# Patient Record
Sex: Female | Born: 1955
Health system: Southern US, Community
[De-identification: ages and names within clinical notes are randomized; demographics above are authoritative.]

## PROBLEM LIST (undated history)

## (undated) DIAGNOSIS — A048 Other specified bacterial intestinal infections: Secondary | ICD-10-CM

## (undated) DIAGNOSIS — R0989 Other specified symptoms and signs involving the circulatory and respiratory systems: Secondary | ICD-10-CM

## (undated) DIAGNOSIS — E039 Hypothyroidism, unspecified: Secondary | ICD-10-CM

## (undated) DIAGNOSIS — J189 Pneumonia, unspecified organism: Secondary | ICD-10-CM

## (undated) DIAGNOSIS — I7090 Unspecified atherosclerosis: Secondary | ICD-10-CM

## (undated) DIAGNOSIS — I771 Stricture of artery: Secondary | ICD-10-CM

## (undated) DIAGNOSIS — J439 Emphysema, unspecified: Secondary | ICD-10-CM

## (undated) DIAGNOSIS — M940 Chondrocostal junction syndrome [Tietze]: Secondary | ICD-10-CM

## (undated) DIAGNOSIS — E785 Hyperlipidemia, unspecified: Secondary | ICD-10-CM

## (undated) DIAGNOSIS — K219 Gastro-esophageal reflux disease without esophagitis: Secondary | ICD-10-CM

## (undated) DIAGNOSIS — K635 Polyp of colon: Secondary | ICD-10-CM

## (undated) DIAGNOSIS — K649 Unspecified hemorrhoids: Secondary | ICD-10-CM

## (undated) DIAGNOSIS — R12 Heartburn: Secondary | ICD-10-CM

## (undated) DIAGNOSIS — R55 Syncope and collapse: Secondary | ICD-10-CM

## (undated) DIAGNOSIS — J42 Unspecified chronic bronchitis: Secondary | ICD-10-CM

## (undated) DIAGNOSIS — R42 Dizziness and giddiness: Secondary | ICD-10-CM

## (undated) HISTORY — DX: Other specified bacterial intestinal infections: A04.8

## (undated) HISTORY — DX: Stricture of artery: I77.1

## (undated) HISTORY — DX: Other specified symptoms and signs involving the circulatory and respiratory systems: R09.89

## (undated) HISTORY — DX: Polyp of colon: K63.5

## (undated) HISTORY — DX: Unspecified atherosclerosis: I70.90

## (undated) HISTORY — DX: Chondrocostal junction syndrome (tietze): M94.0

## (undated) HISTORY — DX: Emphysema, unspecified: J43.9

## (undated) HISTORY — DX: Unspecified hemorrhoids: K64.9

## (undated) SURGERY — Surgical Case
Anesthesia: *Unknown

---

## 1979-06-17 HISTORY — PX: TUBAL LIGATION: SHX77

## 1979-06-17 HISTORY — PX: DILATION AND CURETTAGE OF UTERUS: SHX78

## 1989-06-16 HISTORY — PX: BREAST BIOPSY: SHX20

## 1997-10-16 HISTORY — PX: ABDOMINAL HYSTERECTOMY: SHX81

## 2004-07-11 ENCOUNTER — Encounter: Admission: RE | Admit: 2004-07-11 | Discharge: 2004-07-11 | Payer: Self-pay | Admitting: Family Medicine

## 2004-07-11 ENCOUNTER — Encounter: Admission: RE | Admit: 2004-07-11 | Discharge: 2004-07-11 | Payer: Self-pay | Admitting: Orthopedic Surgery

## 2004-07-20 ENCOUNTER — Encounter: Admission: RE | Admit: 2004-07-20 | Discharge: 2004-07-20 | Payer: Self-pay | Admitting: Family Medicine

## 2004-08-22 ENCOUNTER — Ambulatory Visit: Payer: Self-pay | Admitting: Family Medicine

## 2005-09-15 ENCOUNTER — Encounter: Admission: RE | Admit: 2005-09-15 | Discharge: 2005-09-15 | Payer: Self-pay | Admitting: Emergency Medicine

## 2005-11-14 DIAGNOSIS — I7090 Unspecified atherosclerosis: Secondary | ICD-10-CM

## 2005-11-14 HISTORY — DX: Unspecified atherosclerosis: I70.90

## 2007-05-28 DIAGNOSIS — E039 Hypothyroidism, unspecified: Secondary | ICD-10-CM | POA: Insufficient documentation

## 2007-05-28 DIAGNOSIS — J449 Chronic obstructive pulmonary disease, unspecified: Secondary | ICD-10-CM | POA: Insufficient documentation

## 2007-05-28 DIAGNOSIS — J45909 Unspecified asthma, uncomplicated: Secondary | ICD-10-CM | POA: Insufficient documentation

## 2009-02-01 ENCOUNTER — Encounter: Admission: RE | Admit: 2009-02-01 | Discharge: 2009-02-01 | Payer: Self-pay | Admitting: Family Medicine

## 2009-02-03 ENCOUNTER — Encounter: Admission: RE | Admit: 2009-02-03 | Discharge: 2009-02-03 | Payer: Self-pay | Admitting: Family Medicine

## 2009-02-05 DIAGNOSIS — R0989 Other specified symptoms and signs involving the circulatory and respiratory systems: Secondary | ICD-10-CM

## 2009-02-05 HISTORY — DX: Other specified symptoms and signs involving the circulatory and respiratory systems: R09.89

## 2010-11-05 ENCOUNTER — Encounter: Payer: Self-pay | Admitting: Emergency Medicine

## 2011-09-29 ENCOUNTER — Ambulatory Visit: Payer: Self-pay | Admitting: Family Medicine

## 2012-03-31 ENCOUNTER — Other Ambulatory Visit: Payer: Self-pay | Admitting: Family Medicine

## 2012-06-24 ENCOUNTER — Other Ambulatory Visit: Payer: Self-pay | Admitting: Physician Assistant

## 2012-06-24 NOTE — Telephone Encounter (Signed)
Needs office visit.

## 2012-07-15 ENCOUNTER — Emergency Department (HOSPITAL_COMMUNITY)
Admission: EM | Admit: 2012-07-15 | Discharge: 2012-07-15 | Disposition: A | Discharge status: Home / Self Care | Payer: Self-pay | Attending: Emergency Medicine | Admitting: Emergency Medicine

## 2012-07-15 ENCOUNTER — Ambulatory Visit: Payer: Self-pay

## 2012-07-15 ENCOUNTER — Ambulatory Visit (INDEPENDENT_AMBULATORY_CARE_PROVIDER_SITE_OTHER): Payer: Self-pay | Admitting: Family Medicine

## 2012-07-15 ENCOUNTER — Encounter (HOSPITAL_COMMUNITY): Payer: Self-pay | Admitting: *Deleted

## 2012-07-15 ENCOUNTER — Emergency Department (HOSPITAL_COMMUNITY): Payer: Self-pay

## 2012-07-15 VITALS — BP 120/72 | HR 90 | Temp 98.0°F | Resp 16 | Ht 65.0 in | Wt 156.0 lb

## 2012-07-15 DIAGNOSIS — R42 Dizziness and giddiness: Secondary | ICD-10-CM | POA: Insufficient documentation

## 2012-07-15 DIAGNOSIS — R112 Nausea with vomiting, unspecified: Secondary | ICD-10-CM

## 2012-07-15 DIAGNOSIS — K219 Gastro-esophageal reflux disease without esophagitis: Secondary | ICD-10-CM | POA: Insufficient documentation

## 2012-07-15 DIAGNOSIS — R12 Heartburn: Secondary | ICD-10-CM | POA: Insufficient documentation

## 2012-07-15 DIAGNOSIS — R110 Nausea: Secondary | ICD-10-CM | POA: Insufficient documentation

## 2012-07-15 DIAGNOSIS — R197 Diarrhea, unspecified: Secondary | ICD-10-CM

## 2012-07-15 DIAGNOSIS — R1013 Epigastric pain: Secondary | ICD-10-CM | POA: Insufficient documentation

## 2012-07-15 DIAGNOSIS — I209 Angina pectoris, unspecified: Secondary | ICD-10-CM

## 2012-07-15 DIAGNOSIS — R55 Syncope and collapse: Secondary | ICD-10-CM | POA: Insufficient documentation

## 2012-07-15 DIAGNOSIS — F17200 Nicotine dependence, unspecified, uncomplicated: Secondary | ICD-10-CM | POA: Insufficient documentation

## 2012-07-15 DIAGNOSIS — R079 Chest pain, unspecified: Secondary | ICD-10-CM

## 2012-07-15 DIAGNOSIS — Z7982 Long term (current) use of aspirin: Secondary | ICD-10-CM | POA: Insufficient documentation

## 2012-07-15 DIAGNOSIS — R1110 Vomiting, unspecified: Secondary | ICD-10-CM

## 2012-07-15 DIAGNOSIS — Z79899 Other long term (current) drug therapy: Secondary | ICD-10-CM | POA: Insufficient documentation

## 2012-07-15 DIAGNOSIS — E039 Hypothyroidism, unspecified: Secondary | ICD-10-CM

## 2012-07-15 HISTORY — DX: Syncope and collapse: R55

## 2012-07-15 HISTORY — DX: Hypothyroidism, unspecified: E03.9

## 2012-07-15 HISTORY — DX: Heartburn: R12

## 2012-07-15 HISTORY — DX: Gastro-esophageal reflux disease without esophagitis: K21.9

## 2012-07-15 HISTORY — DX: Dizziness and giddiness: R42

## 2012-07-15 LAB — CBC
HCT: 39.6 % (ref 36.0–46.0)
Hemoglobin: 13.3 g/dL (ref 12.0–15.0)
MCH: 32.5 pg (ref 26.0–34.0)
MCHC: 33.6 g/dL (ref 30.0–36.0)
MCV: 96.8 fL (ref 78.0–100.0)
Platelets: 322 10*3/uL (ref 150–400)
RBC: 4.09 MIL/uL (ref 3.87–5.11)
RDW: 12.7 % (ref 11.5–15.5)
WBC: 7.2 10*3/uL (ref 4.0–10.5)

## 2012-07-15 LAB — POCT UA - MICROSCOPIC ONLY
Casts, Ur, LPF, POC: NEGATIVE
Crystals, Ur, HPF, POC: NEGATIVE
Mucus, UA: NEGATIVE
Yeast, UA: NEGATIVE

## 2012-07-15 LAB — BASIC METABOLIC PANEL
BUN: 8 mg/dL (ref 6–23)
CO2: 23 mEq/L (ref 19–32)
Calcium: 9.7 mg/dL (ref 8.4–10.5)
Chloride: 101 mEq/L (ref 96–112)
Creatinine, Ser: 0.59 mg/dL (ref 0.50–1.10)
GFR calc Af Amer: >90 mL/min (ref >90)
GFR calc non Af Amer: >90 mL/min (ref >90)
Glucose, Bld: 77 mg/dL (ref 70–99)
Potassium: 3.6 mEq/L (ref 3.5–5.1)
Sodium: 136 mEq/L (ref 135–145)

## 2012-07-15 LAB — POCT URINALYSIS DIPSTICK
Bilirubin, UA: NEGATIVE
Blood, UA: NEGATIVE
Glucose, UA: NEGATIVE
Ketones, UA: NEGATIVE
Leukocytes, UA: NEGATIVE
Nitrite, UA: NEGATIVE
Spec Grav, UA: 1.01
Urobilinogen, UA: 0.2
pH, UA: 6

## 2012-07-15 LAB — HEPATIC FUNCTION PANEL
Albumin: 3.6 g/dL (ref 3.5–5.2)
Alkaline Phosphatase: 76 U/L (ref 39–117)
Bilirubin, Direct: <0.1 mg/dL (ref 0.0–0.3)
Total Bilirubin: 0.1 mg/dL — ABNORMAL LOW (ref 0.3–1.2)

## 2012-07-15 LAB — POCT I-STAT TROPONIN I: Troponin i, poc: 0 ng/mL (ref 0.00–0.08)

## 2012-07-15 LAB — LIPASE, BLOOD: Lipase: 54 U/L (ref 11–59)

## 2012-07-15 MED ORDER — OMEPRAZOLE 20 MG PO CPDR: 20.0000 mg | DELAYED_RELEASE_CAPSULE | Freq: Two times a day (BID) | ORAL | Status: DC

## 2012-07-15 MED ORDER — HYDROCODONE-ACETAMINOPHEN 5-500 MG PO TABS: 1.0000 | ORAL_TABLET | Freq: Four times a day (QID) | ORAL | Status: DC | PRN

## 2012-07-15 NOTE — Progress Notes (Signed)
Subjective:    Patient ID: Bethany Clarke, female    DOB: 08-26-1956, 56 y.o.   MRN: [id number]  HPI Bethany Clarke is a 56 y.o. female Hx of hypothyroidism, hyperlipidemia.  C/o n/v, diarrhea , heartburn and chest pain.   Started with soft stools/diarrhea 4 weeks ago.  More watery stools for past 2 weeks - 2 to 3 times per day.  No blood in diarrhea. .  Heartburn and hurting in L chest behind/under breast - chest has felt tight for a week with heartburn, but worse since 3 days to 4 days ago. Nonstop pain.  Pain to push on area at times too, but tightness moves around to back.  No neck, arm or jaw radiation.  No trouble breathing.   Last thyroid check 2 years ago.   Felt "bottomed out" lightheaded spells - few times during the day for past week, near syncope once this am. No diaphoresis, no dyspnea. Drinking fluids, trying to eat, but comes back up.  Lost about 8 pounds over past 4 weeks. Smoker.   No urinary symptoms.  Hx of carotid bruit. Doppler in 4/10 - 0-49%reduction bilaterally.  No known sick contacts.  No treatments attempted.   No known hx CAD, but FH CAD: sister with quadruple bypass at 44yo.  No personal hx of stress test.    Last lipids 03/07/11: LDL 130, TSH 2.79 at that time.  No recent levels checked.     Review of Systems  Constitutional: Negative for fever.  Respiratory: Positive for chest tightness. Negative for shortness of breath.   Cardiovascular: Positive for chest pain.  Gastrointestinal: Positive for nausea, vomiting and diarrhea. Negative for abdominal pain, blood in stool and anal bleeding.  Genitourinary: Negative for dysuria, urgency, frequency, hematuria and difficulty urinating.       Objective:   Physical Exam  Constitutional: She is oriented to person, place, and time. She appears well-developed and well-nourished.  HENT:  Head: Normocephalic and atraumatic.  Eyes: Conjunctivae normal and EOM are normal. Pupils are equal, round, and reactive to  light.  Neck: Carotid bruit is present. No mass and no thyromegaly present.    Cardiovascular: Normal rate, regular rhythm, normal heart sounds and intact distal pulses.  Exam reveals no friction rub.   No murmur heard.      Some ttp lower chest wall - epigastric area, but no focal abdominal ttp.   Pulmonary/Chest: Effort normal and breath sounds normal.  Abdominal: Soft. Bowel sounds are normal. She exhibits no pulsatile midline mass. There is no tenderness. There is no rebound and no guarding.  Neurological: She is alert and oriented to person, place, and time.  Skin: Skin is warm and dry.       Normal turgor.   Psychiatric: She has a normal mood and affect. Her behavior is normal.     EKG: SR, st depression v2-4, nonspecific t waves inferolateral leads - not seen on 02/2007 EKG.   Current pain 1/10. Results for orders placed in visit on 07/15/12  POCT UA - MICROSCOPIC ONLY      Component Value Range   WBC, Ur, HPF, POC 1-2     RBC, urine, microscopic 0-1     Bacteria, U Microscopic small     Mucus, UA neg     Epithelial cells, urine per micros 2-3     Crystals, Ur, HPF, POC neg     Casts, Ur, LPF, POC neg     Yeast, UA neg    POCT  URINALYSIS DIPSTICK      Component Value Range   Color, UA yellow     Clarity, UA clear     Glucose, UA neg     Bilirubin, UA neg     Ketones, UA neg     Spec Grav, UA 1.010     Blood, UA neg     pH, UA 6.0     Protein, UA tr     Urobilinogen, UA 0.2     Nitrite, UA neg     Leukocytes, UA Negative         Assessment & Plan:  Bethany Clarke is a 56 y.o. female 1. Chest pain  EKG 12-Lead, DG Chest 2 View  2. GERD (gastroesophageal reflux disease)    3. Nausea    4. Vomiting  POCT UA - Microscopic Only, POCT urinalysis dipstick  5. Diarrhea  POCT UA - Microscopic Only, POCT urinalysis dipstick  6. Hypothyroidism    7. Dizziness      Complicated recent hx of diarrhea, n/v, with progression to substernal, L sided chest pain with  radiation to back for past 1 week with worsening over past 3-4 days. Accompanied by near syncopal episodes. Risk factors for CAD include tobacco abuse, hyperlipidemia and early FH CAD with quadruple bypass in sister at 44yo.  ekg changes noted since 2008. Possible GERD/esophageal spasm, vs atypical cp with anginal equivalent.  IV placed, monitor, and asa 243 mg chewable(took 81mg  on own this am) given at 1153.  EMS called for transport. Follow up after hospital for GI symptoms for possible stool cx and treatment/eval of GI symptoms.  Charge nurse advised at [facility] ER.

## 2012-07-15 NOTE — ED Provider Notes (Signed)
History     CSN: [id number]  Arrival date & time 07/15/12  1238   First MD Initiated Contact with Patient 07/15/12 1248      Chief Complaint  Patient presents with  . Chest Pain    (Consider location/radiation/quality/duration/timing/severity/associated sxs/prior treatment) HPI Comments: Patient was sent here by her pcp where she presented today for evaluation of lower chest burning and discomfort she says has been going on for the past four weeks.  There was concern over ekg changes at the office.  She denies to me there is any shortness of breath, nausea, or diaphoresis.  She denies any exertional symptoms but does seem to believe that her symptoms are worse when she eats.    Patient is a 56 y.o. female presenting with chest pain.  Chest Pain Episode onset: 4 weeks ago. Chest pain occurs intermittently. The chest pain is worsening. The pain is associated with eating. The severity of the pain is moderate. The quality of the pain is described as burning. The pain does not radiate. Chest pain is worsened by eating. Primary symptoms include nausea. Pertinent negatives for primary symptoms include no fever, no fatigue and no vomiting. She tried nothing for the symptoms. Risk factors include smoking/tobacco exposure (High cholesterol, Family History).     Past Medical History  Diagnosis Date  . GERD (gastroesophageal reflux disease)   . Hypothyroid   . Dizziness   . Syncope, near   . Heart burn     No past surgical history on file.  No family history on file.  History  Substance Use Topics  . Smoking status: Current Every Day Smoker  . Smokeless tobacco: Not on file  . Alcohol Use: Not on file    OB History    Grav Para Term Preterm Abortions TAB SAB Ect Mult Living                  Review of Systems  Constitutional: Negative for fever and fatigue.  Cardiovascular: Positive for chest pain.  Gastrointestinal: Positive for nausea. Negative for vomiting.  All other  systems reviewed and are negative.    Allergies  Review of patient's allergies indicates no known allergies.  Home Medications   Current Outpatient Rx  Name Route Sig Dispense Refill  . ASPIRIN 81 MG PO TABS Oral Take 81 mg by mouth daily.    . ATORVASTATIN CALCIUM 20 MG PO TABS  TAKE 1 TABLET BY MOUTH DAILY 30 tablet 0  . SYNTHROID 100 MCG PO TABS  TAKE 1 TABLET BY MOUTH DAILY - NEED OFFICE VISIT 30 tablet 0    BP 99/59  Pulse 77  Temp 98.8 F (37.1 C) (Oral)  Resp 15  SpO2 92%  Physical Exam  Nursing note and vitals reviewed. Constitutional: She is oriented to person, place, and time. She appears well-developed and well-nourished. No distress.  HENT:  Head: Normocephalic and atraumatic.  Neck: Normal range of motion. Neck supple.  Cardiovascular: Normal rate and regular rhythm.  Exam reveals no gallop and no friction rub.   No murmur heard. Pulmonary/Chest: Effort normal and breath sounds normal. No respiratory distress. She has no wheezes.  Abdominal: Soft. Bowel sounds are normal. She exhibits no distension. There is no tenderness.  Musculoskeletal: Normal range of motion.  Neurological: She is alert and oriented to person, place, and time.  Skin: Skin is warm and dry. She is not diaphoretic.    ED Course  Procedures (including critical care time)   Labs Reviewed  CBC  BASIC METABOLIC PANEL  POCT I-STAT TROPONIN I   No results found.   No diagnosis found.   Date: 07/15/2012  Rate: 81  Rhythm: normal sinus rhythm  QRS Axis: normal  Intervals: normal  ST/T Wave abnormalities: nonspecific T wave changes  Conduction Disutrbances:none  Narrative Interpretation:   Old EKG Reviewed: unchanged    MDM  The patient was sent here for evaluation of upper abdominal/chest discomfort that she has been having for the past month.  It is related to eating and has been having nausea and episodes of diarrhea.  There is no exertional component and I doubt a cardiac  etiology.  Two separate troponins have been negative.  I am in the process of imaging the abdomen with an ultrasound to rule out the possibility of cholelithiasis.  At this point, the care will be signed out to Dr. Ignacia Palma who will obtain the results of the ultrasound and determine the final disposition.          Geoffery Lyons, MD 07/15/12 [id number]

## 2012-07-15 NOTE — Progress Notes (Signed)
56 yo woman with epigastric pain, vomiting x 1 week, Exam showing epigastric localization of pain but no point tenderness, Lab tests WNL, abdominal ultrasound showing gall bladder sludge and common bile duct dilated to 15 mm.  Call to GI --> case discussed with Dr. Yancey Flemings, who advised placing pt on a PPI and having her call in to the [place] [facility] office tomorrow to be seen this week for further workup.

## 2012-07-15 NOTE — ED Notes (Signed)
PT reports a several  Week HX of CP . Strong  Family Hx of cardiac dx.

## 2012-07-16 ENCOUNTER — Telehealth: Payer: Self-pay | Admitting: Internal Medicine

## 2012-07-16 ENCOUNTER — Encounter: Payer: Self-pay | Admitting: Internal Medicine

## 2012-07-16 ENCOUNTER — Ambulatory Visit (INDEPENDENT_AMBULATORY_CARE_PROVIDER_SITE_OTHER): Payer: Self-pay | Admitting: Internal Medicine

## 2012-07-16 VITALS — BP 118/64 | HR 72 | Ht 65.0 in | Wt 156.0 lb

## 2012-07-16 DIAGNOSIS — R110 Nausea: Secondary | ICD-10-CM

## 2012-07-16 DIAGNOSIS — R197 Diarrhea, unspecified: Secondary | ICD-10-CM

## 2012-07-16 DIAGNOSIS — R1013 Epigastric pain: Secondary | ICD-10-CM

## 2012-07-16 DIAGNOSIS — R932 Abnormal findings on diagnostic imaging of liver and biliary tract: Secondary | ICD-10-CM

## 2012-07-16 MED ORDER — ESOMEPRAZOLE MAGNESIUM 40 MG PO CPDR: 40.0000 mg | DELAYED_RELEASE_CAPSULE | Freq: Every day | ORAL | Status: DC

## 2012-07-16 NOTE — Progress Notes (Signed)
HISTORY OF PRESENT ILLNESS:  Bethany Clarke is a 56 y.o. female with hypothyroidism and COPD who presents today, after being evaluated in the emergency room yesterday, regarding abdominal pain and abnormal abdominal imaging. The patient's only prior GI history is that of screening colonoscopy 02/23/2009 with Dr. Jeani Hawking. The examination was complete with good preparation. A diminutive hyperplastic rectosigmoid colon polyp was removed and small internal hemorrhoids noted. Routine followup in 10 years recommended. Patient states that she was in her usual state of good health until approximately 4 weeks ago when she developed, what she describes as a "upset stomach". She began to notice postprandial loose bowel movements with some vague mid abdominal discomfort. This continued until last week when she began to develop postprandial nausea with vomiting as well as severe epigastric and left upper quadrant pain. This bothered her for about 3 days. She was seen at urgent care and thought this may represent an anginal equivalent. Thus, she was sent to the emergency room. She denies fevers, melena, hematochezia, or significant weight loss. She has complained of decreased energy. Emergency room workup included comprehensive metabolic panel and CBC. These were normal. Cardiac workup, including troponins were negative. She was not felt to have a cardiac problem. They did obtain an abdominal ultrasound yesterday which revealed gallbladder sludge without stones. No gallbladder wall thickening. She was said to have a dilated common bile duct of 15 mm. However, the duct was not well visualized in the region of the pancreas. There was no intrahepatic ductal dilation. She was prescribed Vicodin and omeprazole, which she has not filled. No new complaints today. She smokes but does not use alcohol. Her chronic medications are unchanged.  REVIEW OF SYSTEMS:  All non-GI ROS negative except for fatigue and visual  change  Past Medical History  Diagnosis Date  . GERD (gastroesophageal reflux disease)   . Hypothyroid   . Dizziness   . Syncope, near   . Heart burn   . COPD (chronic obstructive pulmonary disease)     Past Surgical History  Procedure Date  . Abdominal hysterectomy     Social History Bethany Clarke  reports that she has been smoking.  She has never used smokeless tobacco. She reports that she does not drink alcohol or use illicit drugs.  family history is negative for Colon cancer.  No Known Allergies     PHYSICAL EXAMINATION: Vital signs: BP 118/64  Pulse 72  Ht 5\' 5"  (1.651 m)  Wt 156 lb (70.761 kg)  BMI 25.96 kg/m2  Constitutional: generally well-appearing, no acute distress Psychiatric: alert and oriented x3, cooperative Eyes: extraocular movements intact, anicteric, conjunctiva pink Mouth: oral pharynx moist, no lesions Neck: supple no lymphadenopathy Cardiovascular: heart regular rate and rhythm, no murmur Lungs: clear to auscultation bilaterally Abdomen: soft, mild epigastric tenderness, nondistended, no obvious ascites, no peritoneal signs, normal bowel sounds, no organomegaly Rectal:omitted Extremities: no lower extremity edema bilaterally Skin: no lesions on visible extremities Neuro: No focal deficits.   ASSESSMENT:  #1. 4 week history of postprandial loose stools. May be postinfectious IBS #2. Less than 1 week history of postprandial, pain with associated nausea/vomiting. Rule out peptic ulcer #3. Question of isolated dilation of the bile duct. Clinical significance uncertain  #4. Screening colonoscopy May 2010 without neoplasia   PLAN:  #1. Begin PPI. Nexium 40 mg daily x10 days provided #2. Schedule diagnostic upper endoscopy.The nature of the procedure, as well as the risks, benefits, and alternatives were carefully and thoroughly reviewed with the patient.  Ample time for discussion and questions allowed. The patient understood, was satisfied,  and agreed to proceed.  #3. If upper endoscopy negative, and pain continues, consider HIDA scan #4. Consider MRCP to further evaluate the bile duct pending clinical course #5. Repeat screening colonoscopy around 2020

## 2012-07-16 NOTE — Telephone Encounter (Signed)
Pt scheduled to see Dr. Marina Goodell today at 1:45pm. ER physician spoke with Dr. Marina Goodell last night regarding the pt. Pt aware of appt date and time.

## 2012-07-16 NOTE — Patient Instructions (Addendum)
You have been scheduled for an endoscopy with propofol. Please follow written instructions given to you at your visit today. If you use inhalers (even only as needed), please bring them with you on the day of your procedure.   You have been given some samples of Nexium to try.  Take one capsule 30 minutes before breakfast .

## 2012-07-25 ENCOUNTER — Ambulatory Visit (AMBULATORY_SURGERY_CENTER): Payer: BC Managed Care – PPO | Admitting: Internal Medicine

## 2012-07-25 ENCOUNTER — Encounter: Payer: Self-pay | Admitting: Internal Medicine

## 2012-07-25 VITALS — BP 102/80 | HR 77 | Temp 98.9°F | Resp 21 | Ht 65.0 in | Wt 156.0 lb

## 2012-07-25 DIAGNOSIS — R110 Nausea: Secondary | ICD-10-CM

## 2012-07-25 DIAGNOSIS — R1013 Epigastric pain: Secondary | ICD-10-CM

## 2012-07-25 DIAGNOSIS — K2900 Acute gastritis without bleeding: Secondary | ICD-10-CM

## 2012-07-25 DIAGNOSIS — R197 Diarrhea, unspecified: Secondary | ICD-10-CM

## 2012-07-25 DIAGNOSIS — K219 Gastro-esophageal reflux disease without esophagitis: Secondary | ICD-10-CM

## 2012-07-25 MED ORDER — SODIUM CHLORIDE 0.9 % IV SOLN: 500.0000 mL | INTRAVENOUS | Status: DC

## 2012-07-25 MED ORDER — OMEPRAZOLE 20 MG PO CPDR: 20.0000 mg | DELAYED_RELEASE_CAPSULE | Freq: Every day | ORAL | Status: DC

## 2012-07-25 NOTE — Progress Notes (Signed)
Dentures remain in place, as pre procedure, no defects.

## 2012-07-25 NOTE — Patient Instructions (Addendum)
YOU HAD AN ENDOSCOPIC PROCEDURE TODAY AT THE [facility]: Refer to the procedure report that was given to you for any specific questions about what was found during the examination.  If the procedure report does not answer your questions, please call your gastroenterologist to clarify.  If you requested that your care partner not be given the details of your procedure findings, then the procedure report has been included in a sealed envelope for you to review at your convenience later.  YOU SHOULD EXPECT: Some feelings of bloating in the abdomen. Passage of more gas than usual.  Walking can help get rid of the air that was put into your GI tract during the procedure and reduce the bloating. If you had a lower endoscopy (such as a colonoscopy or flexible sigmoidoscopy) you may notice spotting of blood in your stool or on the toilet paper. If you underwent a bowel prep for your procedure, then you may not have a normal bowel movement for a few days.  DIET: Your first meal following the procedure should be a light meal and then it is ok to progress to your normal diet.  A half-sandwich or bowl of soup is an example of a good first meal.  Heavy or fried foods are harder to digest and may make you feel nauseous or bloated.  Likewise meals heavy in dairy and vegetables can cause extra gas to form and this can also increase the bloating.  Drink plenty of fluids but you should avoid alcoholic beverages for 24 hours.  ACTIVITY: Your care partner should take you home directly after the procedure.  You should plan to take it easy, moving slowly for the rest of the day.  You can resume normal activity the day after the procedure however you should NOT DRIVE or use heavy machinery for 24 hours (because of the sedation medicines used during the test).    SYMPTOMS TO REPORT IMMEDIATELY: A gastroenterologist can be reached at any hour.  During normal business hours, 8:30 AM to 5:00 PM Monday through Friday,  call [phone].  After hours and on weekends, please call the GI answering service at [phone] who will take a message and have the physician on call contact you.   Following lower endoscopy (colonoscopy or flexible sigmoidoscopy):  Excessive amounts of blood in the stool  Significant tenderness or worsening of abdominal pains  Swelling of the abdomen that is new, acute  Fever of 100F or higher  Following upper endoscopy (EGD)  Vomiting of blood or coffee ground material  New chest pain or pain under the shoulder blades  Painful or persistently difficult swallowing  New shortness of breath  Fever of 100F or higher  Black, tarry-looking stools  FOLLOW UP: If any biopsies were taken you will be contacted by phone or by letter within the next 1-3 weeks.  Call your gastroenterologist if you have not heard about the biopsies in 3 weeks.  Our staff will call the home number listed on your records the next business day following your procedure to check on you and address any questions or concerns that you may have at that time regarding the information given to you following your procedure. This is a courtesy call and so if there is no answer at the home number and we have not heard from you through the emergency physician on call, we will assume that you have returned to your regular daily activities without incident.  SIGNATURES/CONFIDENTIALITY: You and/or your care   partner have signed paperwork which will be entered into your electronic medical record.  These signatures attest to the fact that that the information above on your After Visit Summary has been reviewed and is understood.  Full responsibility of the confidentiality of this discharge information lies with you and/or your care-partner.   Please follow all discharge instructions given to you by the recovery room nurse. If you have any questions or problems after discharge please call one of the numbers listed above. You will  receive a phone call in the am to see how you are doing and answer any questions you may have. Thank you for choosing [facility] for your health care needs.  

## 2012-07-25 NOTE — Op Note (Signed)
[facility] [address] [place], [address]   ENDOSCOPY PROCEDURE REPORT  PATIENT: Bethany Clarke, Bethany Clarke  MR#: [id number] BIRTHDATE: 1956-01-17 , 56  yrs. old GENDER: Female ENDOSCOPIST: Roxy Cedar, MD REFERRED BY:  Office Self, ER PROCEDURE DATE:  07/25/2012 PROCEDURE:  EGD w/ biopsy ASA CLASS:     Class II INDICATIONS:  epigastric pain.   nausea.   vomiting. MEDICATIONS: MAC sedation, administered by CRNA and propofol (Diprivan) 200mg  IV TOPICAL ANESTHETIC: Cetacaine Spray  DESCRIPTION OF PROCEDURE: After the risks benefits and alternatives of the procedure were thoroughly explained, informed consent was obtained.  The LB GIF-H180 [id number] endoscope was introduced through the mouth and advanced to the second portion of the duodenum. Without limitations.  The instrument was slowly withdrawn as the mucosa was fully examined.    EXAM: Normal esophagus.  Mild antral gastritis and non-erosive duodenitis.  Otherwise normal exam.  Clo bx taken.  Retroflexed views revealed no abnormalities.     The scope was then withdrawn from the patient and the procedure completed.  COMPLICATIONS: There were no complications. ENDOSCOPIC IMPRESSION: 1.  Normal esophagus. 2.  Mild antral gastritis and non-erosive duodenitis. 3.  Otherwise normal exam.  Clo bx taken.  RECOMMENDATIONS: 1.  Continue PPI - Helping reflux symptoms . Prescribe OMEPRAZOLE 20 MG DAILY; #30; 6 REFILLS 2.  Rx CLO if positive 3.  My office will arrange for you to have a HIDA scan performed. This is a radiology test which gives [place] an idea of how well your gallbladder functions. FURTHER PLANS TO BE DETERMINED THEREAFTER    eSigned:  Roxy Cedar, MD 07/25/2012 12:46 PM   CC:The Patient and Meredith Staggers, MD

## 2012-07-25 NOTE — Progress Notes (Signed)
Patient did not experience any of the following events: a burn prior to discharge; a fall within the facility; wrong site/side/patient/procedure/implant event; or a hospital transfer or hospital admission upon discharge from the facility. (G8907) Patient did not have preoperative order for IV antibiotic SSI prophylaxis. (G8918)  

## 2012-07-25 NOTE — Progress Notes (Signed)
Patient has full upper and lower dentures and refuses to remove them. Dr. Marina Goodell  And Marrion Coy CRNA informed,both in agreement for patient to leave them. Patient stating the dentures are secure.

## 2012-07-26 ENCOUNTER — Telehealth: Payer: Self-pay | Admitting: *Deleted

## 2012-07-26 ENCOUNTER — Other Ambulatory Visit: Payer: Self-pay | Admitting: Internal Medicine

## 2012-07-26 ENCOUNTER — Telehealth: Payer: Self-pay

## 2012-07-26 DIAGNOSIS — R109 Unspecified abdominal pain: Secondary | ICD-10-CM

## 2012-07-26 NOTE — Telephone Encounter (Signed)
Pt scheduled for HIDA scan at [facility] 08/08/12 arrival time 12:15 for a 12:30pm appt. Pt to be NPO after 6am. Pt aware of appt date and time.

## 2012-07-26 NOTE — Telephone Encounter (Signed)
  Follow up Call-  Call back number 07/25/2012  Post procedure Call Back phone  # [phone]  Permission to leave phone message Yes     Patient questions:  Do you have a fever, pain , or abdominal swelling? no Pain Score  0 *  Have you tolerated food without any problems? yes  Have you been able to return to your normal activities? yes  Do you have any questions about your discharge instructions: Diet   no Medications  no Follow up visit  no  Do you have questions or concerns about your Care? no  Actions: * If pain score is 4 or above: No action needed, pain <4.

## 2012-07-29 ENCOUNTER — Other Ambulatory Visit: Payer: Self-pay | Admitting: Internal Medicine

## 2012-07-29 MED ORDER — AMOXICILL-CLARITHRO-LANSOPRAZ PO MISC: Freq: Two times a day (BID) | ORAL | Status: DC

## 2012-08-05 ENCOUNTER — Other Ambulatory Visit: Payer: Self-pay | Admitting: Physician Assistant

## 2012-08-05 NOTE — Telephone Encounter (Signed)
Per last refill needs OV - 2nd notice

## 2012-08-08 ENCOUNTER — Encounter (HOSPITAL_COMMUNITY): Payer: Self-pay

## 2012-08-08 ENCOUNTER — Encounter (HOSPITAL_COMMUNITY)
Admission: RE | Admit: 2012-08-08 | Discharge: 2012-08-08 | Disposition: A | Discharge status: Home / Self Care | Payer: BC Managed Care – PPO | Source: Ambulatory Visit | Attending: Internal Medicine | Admitting: Internal Medicine

## 2012-08-08 DIAGNOSIS — K838 Other specified diseases of biliary tract: Secondary | ICD-10-CM | POA: Insufficient documentation

## 2012-08-08 DIAGNOSIS — R109 Unspecified abdominal pain: Secondary | ICD-10-CM

## 2012-08-08 MED ORDER — TECHNETIUM TC 99M MEBROFENIN IV KIT
5.4000 | PACK | Freq: Once | INTRAVENOUS | Status: AC | PRN
  Administered 2012-08-08: 5.4 via INTRAVENOUS

## 2012-09-09 ENCOUNTER — Encounter: Payer: Self-pay | Admitting: Internal Medicine

## 2012-09-09 ENCOUNTER — Other Ambulatory Visit: Payer: Self-pay | Admitting: Physician Assistant

## 2012-09-09 ENCOUNTER — Ambulatory Visit (INDEPENDENT_AMBULATORY_CARE_PROVIDER_SITE_OTHER): Payer: BC Managed Care – PPO | Admitting: Internal Medicine

## 2012-09-09 VITALS — BP 124/80 | HR 60 | Ht 65.0 in | Wt 154.0 lb

## 2012-09-09 DIAGNOSIS — A048 Other specified bacterial intestinal infections: Secondary | ICD-10-CM

## 2012-09-09 DIAGNOSIS — R1011 Right upper quadrant pain: Secondary | ICD-10-CM

## 2012-09-09 DIAGNOSIS — R932 Abnormal findings on diagnostic imaging of liver and biliary tract: Secondary | ICD-10-CM

## 2012-09-09 DIAGNOSIS — B9681 Helicobacter pylori [H. pylori] as the cause of diseases classified elsewhere: Secondary | ICD-10-CM

## 2012-09-09 NOTE — Patient Instructions (Addendum)
You have been scheduled for an MRI at [facility] on 09-14-12. Your appointment time is 9:00am. Please arrive 15 minutes prior to your appointment time for registration purposes. Please do not eat or drink anything after 5:00am. However, if you have any metal in your body, have a pacemaker or defibrillator, please be sure to let your ordering physician know. This test typically takes 45 minutes to 1 hour to complete.

## 2012-09-09 NOTE — Progress Notes (Signed)
HISTORY OF PRESENT ILLNESS:  Bethany Clarke is a 56 y.o. female who was initially evaluated 07/16/2012 regarding abdominal pain, nausea/vomiting, and postprandial loose stools. In addition, isolated extrahepatic bile duct dilation on ultrasound. See that dictation. She subsequently underwent upper endoscopy which revealed mild antral gastritis and nonerosive duodenitis. Prescribe omeprazole 20 mg daily. She completed 2 weeks of Nexium samples, but did not pick up her omeprazole stating that Nexium did not seem to make a difference. However, she previously reported that PPI seemed to help reflux symptoms. Now denies reflux symptoms. Testing for Helicobacter pylori was positive. I did want to treat her with Prevpac x2 weeks. Not clear if she completed this therapy. She also underwent HIDA scan which was normal, the lower limits. Overall, she has improved. No further problems with nausea or vomiting. Still occasional loose stools. She does report epigastric right upper quadrant discomfort with certain foods such as heavy were greasy items. Discomfort to last for several hours.  REVIEW OF SYSTEMS:  All non-GI ROS negative   Past Medical History  Diagnosis Date  . GERD (gastroesophageal reflux disease)   . Hypothyroid   . Dizziness   . Syncope, near   . Heart burn   . COPD (chronic obstructive pulmonary disease)   . Emphysema of lung   . H. pylori infection     Past Surgical History  Procedure Date  . Abdominal hysterectomy     Social History Bethany Clarke  reports that she has been smoking.  She has never used smokeless tobacco. She reports that she does not drink alcohol or use illicit drugs.  family history is negative for Colon cancer.  No Known Allergies     PHYSICAL EXAMINATION: Vital signs: BP 124/80  Pulse 60  Ht 5\' 5"  (1.651 m)  Wt 154 lb (69.854 kg)  BMI 25.63 kg/m2 General: Well-developed, well-nourished, no acute distress HEENT: Sclerae are anicteric, conjunctiva  pink. Oral mucosa intact Lungs: Clear Heart: Regular Abdomen: soft, nontender, nondistended, no obvious ascites, no peritoneal signs, normal bowel sounds. No organomegaly. Extremities: No edema Psychiatric: alert and oriented x3. Cooperative    ASSESSMENT:  #1. Recurrent upper abdominal pain. Ongoing. Other significant GI symptoms resolved or significantly improved #2. Dilated extrahepatic duct on ultrasound. Normal liver tests. Normal ampulla on recent endoscopy #3. Mild gastroduodenitis. H. Pylori positive. Unclear if she completed therapy as prescribed #4. Negative screening colonoscopy in 2010 with Dr. Jeani Hawking   PLAN:  #1. MRCP /MRI to evaluate the dilated bile duct, given ongoing intermittent pain #2. Confirm whether she completed H. Pyloric therapy or not #3. Routine surveillance colonoscopy 2020

## 2012-09-14 ENCOUNTER — Ambulatory Visit (HOSPITAL_COMMUNITY)
Admission: RE | Admit: 2012-09-14 | Discharge: 2012-09-14 | Disposition: A | Discharge status: Home / Self Care | Payer: BC Managed Care – PPO | Source: Ambulatory Visit | Attending: Internal Medicine | Admitting: Internal Medicine

## 2012-09-14 ENCOUNTER — Inpatient Hospital Stay (HOSPITAL_COMMUNITY): Admission: RE | Admit: 2012-09-14 | Payer: BC Managed Care – PPO | Source: Ambulatory Visit

## 2012-09-14 DIAGNOSIS — R932 Abnormal findings on diagnostic imaging of liver and biliary tract: Secondary | ICD-10-CM

## 2012-09-14 DIAGNOSIS — R1011 Right upper quadrant pain: Secondary | ICD-10-CM | POA: Insufficient documentation

## 2012-09-14 MED ORDER — GADOBENATE DIMEGLUMINE 529 MG/ML IV SOLN
15.0000 mL | Freq: Once | INTRAVENOUS | Status: AC | PRN
  Administered 2012-09-14: 15 mL via INTRAVENOUS

## 2012-10-11 ENCOUNTER — Other Ambulatory Visit: Payer: Self-pay | Admitting: Physician Assistant

## 2012-11-14 ENCOUNTER — Other Ambulatory Visit: Payer: Self-pay | Admitting: Physician Assistant

## 2012-11-18 ENCOUNTER — Other Ambulatory Visit: Payer: Self-pay | Admitting: Internal Medicine

## 2012-11-18 DIAGNOSIS — R7989 Other specified abnormal findings of blood chemistry: Secondary | ICD-10-CM

## 2012-11-18 DIAGNOSIS — R945 Abnormal results of liver function studies: Secondary | ICD-10-CM

## 2012-11-30 ENCOUNTER — Other Ambulatory Visit: Payer: Self-pay

## 2012-12-05 ENCOUNTER — Telehealth: Payer: Self-pay

## 2012-12-05 MED ORDER — LEVOTHYROXINE SODIUM 100 MCG PO TABS: 100.0000 ug | ORAL_TABLET | Freq: Every day | ORAL | Status: DC

## 2012-12-05 MED ORDER — ATORVASTATIN CALCIUM 20 MG PO TABS: 20.0000 mg | ORAL_TABLET | Freq: Every day | ORAL | Status: DC

## 2012-12-05 NOTE — Telephone Encounter (Signed)
Sent in for her. Called her to advise.  

## 2012-12-05 NOTE — Telephone Encounter (Signed)
Pt called today and scheduled a CPE with Dr. Neva Seat for 3/31 (first avail CPE). She is requesting her synthroid and lipitor be refilled until then. Pt [phone]  [facility] [place]

## 2012-12-25 ENCOUNTER — Other Ambulatory Visit (INDEPENDENT_AMBULATORY_CARE_PROVIDER_SITE_OTHER): Payer: BC Managed Care – PPO

## 2012-12-25 ENCOUNTER — Ambulatory Visit (INDEPENDENT_AMBULATORY_CARE_PROVIDER_SITE_OTHER): Payer: BC Managed Care – PPO | Admitting: Internal Medicine

## 2012-12-25 ENCOUNTER — Encounter: Payer: Self-pay | Admitting: Internal Medicine

## 2012-12-25 VITALS — BP 108/78 | HR 73 | Ht 65.0 in | Wt 157.0 lb

## 2012-12-25 DIAGNOSIS — R7989 Other specified abnormal findings of blood chemistry: Secondary | ICD-10-CM

## 2012-12-25 DIAGNOSIS — R945 Abnormal results of liver function studies: Secondary | ICD-10-CM

## 2012-12-25 DIAGNOSIS — R1011 Right upper quadrant pain: Secondary | ICD-10-CM

## 2012-12-25 DIAGNOSIS — R932 Abnormal findings on diagnostic imaging of liver and biliary tract: Secondary | ICD-10-CM

## 2012-12-25 LAB — HEPATIC FUNCTION PANEL
Bilirubin, Direct: 0.1 mg/dL (ref 0.0–0.3)
Total Bilirubin: 0.3 mg/dL (ref 0.3–1.2)

## 2012-12-25 NOTE — Patient Instructions (Addendum)
Your physician has requested that you go to the basement for the following lab work before leaving today:  LFT's  Please follow up with Dr. Marina Goodell in 6 months

## 2012-12-25 NOTE — Progress Notes (Signed)
HISTORY OF PRESENT ILLNESS:  Bethany Clarke is a 57 y.o. female who was initially evaluated 07/16/2012 regarding abdominal pain, nausea/vomiting, and postprandial loose stools. In addition, an isolated extrahepatic bile duct dilation on ultrasound. Upper endoscopy revealed mild gastroduodenitis. H. pyloric positive. Subsequently treated. Last seen in 09/09/2012. The majority of her GI symptoms resolved except for upper abdominal pain. Subsequent MRI/MRCP revealed mild dilation of the extrahepatic bile duct with no other significant abnormalities. Liver tests were normal. Prior screening colonoscopy in 2010 with Dr. Elnoria Howard was normal. She presents now for routine followup as requested. Patient reports she is doing well. No nausea or vomiting. No loose stools. She does continue to have right upper quadrant pain approximately once per week. This is described as sharp and lasts about 30 minutes. No relationship to meals or time of day. No nocturnal component. Her appetite is good and weight stable. Actually gained a few pounds. No other complaints. Her brother recently passed away from non-GI cancer  REVIEW OF SYSTEMS:  All non-GI ROS negative except for fatigue  Past Medical History  Diagnosis Date  . GERD (gastroesophageal reflux disease)   . Hypothyroid   . Dizziness   . Syncope, near   . Heart burn   . COPD (chronic obstructive pulmonary disease)   . Emphysema of lung   . H. pylori infection     Past Surgical History  Procedure Laterality Date  . Abdominal hysterectomy      Social History [name]  reports that she has been smoking.  She has never used smokeless tobacco. She reports that she does not drink alcohol or use illicit drugs.  family history includes Cancer in her brother and Rheum arthritis in her brother.  There is no history of Colon cancer.  No Known Allergies     PHYSICAL EXAMINATION:  Vital signs: BP 108/78  Pulse 73  Ht 5\' 5"  (1.651 m)  Wt 157 lb  (71.215 kg)  BMI 26.13 kg/m2 General: Well-developed, well-nourished, no acute distress HEENT: Sclerae are anicteric, conjunctiva pink. Oral mucosa intact Lungs: Clear Heart: Regular Abdomen: soft, nontender, nondistended, no obvious ascites, no peritoneal signs, normal bowel sounds. No organomegaly. Extremities: No edema Psychiatric: alert and oriented x3. Cooperative    ASSESSMENT:  #1. Recurrent right upper quadrant pain as described. Mild and manageable. Etiology unclear. No worrisome features at this point #2. Mild extrahepatic bile duct dilation with no other abnormalities and normal liver tests #3. Negative screening colonoscopy 2010  PLAN:  #1. Ongoing expectant management #2. Repeat liver tests today #3. Routine GI followup in 6 months. Contact the office in the interim for any problems or questions #4. Routine surveillance colonoscopy 2020

## 2013-01-13 ENCOUNTER — Encounter: Payer: Self-pay | Admitting: Family Medicine

## 2013-01-13 ENCOUNTER — Ambulatory Visit (INDEPENDENT_AMBULATORY_CARE_PROVIDER_SITE_OTHER): Payer: BC Managed Care – PPO | Admitting: Family Medicine

## 2013-01-13 ENCOUNTER — Ambulatory Visit: Payer: BC Managed Care – PPO

## 2013-01-13 VITALS — BP 98/78 | HR 82 | Temp 98.2°F | Resp 16 | Ht 64.5 in | Wt 157.2 lb

## 2013-01-13 DIAGNOSIS — Z720 Tobacco use: Secondary | ICD-10-CM

## 2013-01-13 DIAGNOSIS — G4700 Insomnia, unspecified: Secondary | ICD-10-CM

## 2013-01-13 DIAGNOSIS — R079 Chest pain, unspecified: Secondary | ICD-10-CM

## 2013-01-13 DIAGNOSIS — Z8249 Family history of ischemic heart disease and other diseases of the circulatory system: Secondary | ICD-10-CM

## 2013-01-13 DIAGNOSIS — Z0000 Encounter for general adult medical examination without abnormal findings: Secondary | ICD-10-CM

## 2013-01-13 DIAGNOSIS — F17200 Nicotine dependence, unspecified, uncomplicated: Secondary | ICD-10-CM

## 2013-01-13 DIAGNOSIS — F418 Other specified anxiety disorders: Secondary | ICD-10-CM

## 2013-01-13 DIAGNOSIS — E785 Hyperlipidemia, unspecified: Secondary | ICD-10-CM

## 2013-01-13 DIAGNOSIS — E039 Hypothyroidism, unspecified: Secondary | ICD-10-CM

## 2013-01-13 LAB — CBC WITH DIFFERENTIAL/PLATELET
Eosinophils Relative: 0 % (ref 0–5)
Lymphocytes Relative: 41 % (ref 12–46)
Lymphs Abs: 2.2 10*3/uL (ref 0.7–4.0)
MCV: 97.8 fL (ref 78.0–100.0)
Neutro Abs: 2.8 10*3/uL (ref 1.7–7.7)
Neutrophils Relative %: 52 % (ref 43–77)
Platelets: 320 10*3/uL (ref 150–400)
RBC: 4.04 MIL/uL (ref 3.87–5.11)
WBC: 5.5 10*3/uL (ref 4.0–10.5)

## 2013-01-13 LAB — LIPID PANEL
LDL Cholesterol: 92 mg/dL (ref 0–99)
Total CHOL/HDL Ratio: 3.2 Ratio

## 2013-01-13 LAB — COMPREHENSIVE METABOLIC PANEL
ALT: 44 U/L — ABNORMAL HIGH (ref 0–35)
AST: 20 U/L (ref 0–37)
Albumin: 4.8 g/dL (ref 3.5–5.2)
Alkaline Phosphatase: 80 U/L (ref 39–117)
Calcium: 10.4 mg/dL (ref 8.4–10.5)
Chloride: 103 mEq/L (ref 96–112)
Creat: 0.59 mg/dL (ref 0.50–1.10)
Potassium: 4.2 mEq/L (ref 3.5–5.3)

## 2013-01-13 LAB — POCT URINALYSIS DIPSTICK
Glucose, UA: NEGATIVE
Ketones, UA: NEGATIVE
Spec Grav, UA: 1.015
Urobilinogen, UA: 0.2

## 2013-01-13 LAB — TSH: TSH: 2.334 u[IU]/mL (ref 0.350–4.500)

## 2013-01-13 MED ORDER — CLONAZEPAM 0.5 MG PO TABS: 0.5000 mg | ORAL_TABLET | Freq: Two times a day (BID) | ORAL | Status: DC | PRN

## 2013-01-13 MED ORDER — ATORVASTATIN CALCIUM 20 MG PO TABS: 20.0000 mg | ORAL_TABLET | Freq: Every day | ORAL | Status: DC

## 2013-01-13 MED ORDER — LEVOTHYROXINE SODIUM 100 MCG PO TABS: 100.0000 ug | ORAL_TABLET | Freq: Every day | ORAL | Status: DC

## 2013-01-13 MED ORDER — VARENICLINE TARTRATE 0.5 MG PO TABS: 0.5000 mg | ORAL_TABLET | Freq: Two times a day (BID) | ORAL | Status: DC

## 2013-01-13 NOTE — Progress Notes (Signed)
  Subjective:    Patient ID: Bethany Clarke, female    DOB: 18-Oct-1955, 57 y.o.   MRN: [id number]  HPI    Review of Systems  Constitutional: Positive for appetite change and fatigue.  HENT: Negative.   Eyes: Negative.   Respiratory: Negative.   Cardiovascular: Positive for chest pain.  Gastrointestinal: Positive for abdominal pain.  Endocrine: Negative.   Genitourinary: Negative.   Musculoskeletal: Negative.   Skin: Negative.   Allergic/Immunologic: Negative.   Neurological: Positive for weakness and light-headedness.  Hematological: Negative.   Psychiatric/Behavioral: Negative.        Objective:   Physical Exam        Assessment & Plan:

## 2013-01-13 NOTE — Progress Notes (Addendum)
Subjective:    Patient ID: Bethany Clarke, female    DOB: [birth year], 57 y.o.   MRN: [id number]  HPI Bethany Clarke is a 57 y.o. female Hx of hypothyroidism, hyperlipidemia.  Here for CPE: Last colonoscopy - 2010 - normal, Dr Elnoria Howard. Repeat in 10 years.  Flu vaccine: none this year, declined today.   Mammogram: none in past few years, no new nodules or concerns - she will call to schedule.  Pap testing: s/p hysterectomy in '99, had normal pap testing afterward. No recommended follow up. No new vaginal rash or lesions, no vaginal bleeding.  Last td 2008.  Dentist - last visit 1 and 1/2 year ago. optho - last visit 8 years ago - plans on scheduling appt on own as vision decreased past year.   Hypothyroidism - on synthroid QD. No recent TSH. No new side effects of meds.  No new heat or cold intolerance, no weight change, no hair change. Does feel fatigued.   Occasional lightheaded when 1st standing. Tried drinking more water during the day. Feels like in a tunnel for few seconds only and resolves on own.  Last 6 months.   Hyperlipidemia - fasting today, last food 6:30 pm last night.Taking Lipitor QHS.  Brother passed away 11/19/2022.  COPD, then mass in lung and spine - lung cancer.  Was on rheum arthritis meds as well. He was a smoker as well.   Declines counseing or problems with this, and would not see counsleor.  Trouble getting to sleep at times, with mind racing - takes tylenol pm - noted before brother passing. Sleeping 7 1/2 to 10 hours.   Tobacco abuse - 1/2 ppd.  Would like to try chantix again. Last taken 2 years ago - quit for 3 weeks. .  Did feel anxious or angry at times when taking chantix, would like to have something for this.  ? Spot on her lungs few years ago on CT scan. CT 03/20/07- faint density in R lung near minor fissure. 09/2007-repeat CT chest - minimal chronic post inflammatory scarring in lingula of L lung, no new or enlarging nodules identified. No new cough,  no wheeze, no shortness of breath.    Carotid bruit - normal flow velocities with mild calcific nonstenotic plaque bilaterally.   Chest pain at 9/13 ov - L sided with radiation to back. FH CAD: sister with quadruple bypass at 44yo. Pt sent to er for eval of chest pain at 9/13 ov, but cardiac workup, including troponins were negative. She was not felt to have a cardiac problem. They did obtain an abdominal ultrasound which revealed gallbladder sludge without stones. No gallbladder wall thickening. She was said to have a dilated common bile duct of 15 mm. There was no intrahepatic ductal dilation.   Still feeling lower chest/epigastric pain. Moves to back.  Would like to have evaluation with a cardiologist and stress test. Aware of pain every day - some foods affect this, slight nausea last night, no vomiting. No diaphoresis. No shortness of breath.   Epigastric pain/RUQ - followed by Dr. Marina Goodell. Initial OV 07/16/12, then follow up visit  12/25/12. initial upper endoscopy revealed mild gastroduodenitis. H. pyloric positive. Subsequently treated. MRI/MRCP revealed mild dilation of the extrahepatic bile duct with no other significant abnormalities. Liver tests were normal. Prior screening colonoscopy in 2010 with Dr. Elnoria Howard was normal.  Planning on follow up in 6 months. Not on ppi now - did not help per pt.  Results for orders placed  in visit on 12/25/12  HEPATIC FUNCTION PANEL      Result Value Range   Total Bilirubin 0.3  0.3 - 1.2 mg/dL   Bilirubin, Direct 0.1  0.0 - 0.3 mg/dL   Alkaline Phosphatase 74  39 - 117 U/L   AST 22  0 - 37 U/L   ALT 43 (*) 0 - 35 U/L   Total Protein 7.8  6.0 - 8.3 g/dL   Albumin 4.4  3.5 - 5.2 g/dL    Review of Systems As above and 13 point ros per nursing note reviewed. Negative other than listed.     Objective:   Physical Exam  Vitals reviewed. Constitutional: She is oriented to person, place, and time. She appears well-developed and well-nourished.  HENT:  Head:  Normocephalic and atraumatic.  Right Ear: External ear normal.  Left Ear: External ear normal.  Mouth/Throat: Oropharynx is clear and moist.  Eyes: Conjunctivae are normal. Pupils are equal, round, and reactive to light.  Neck: Normal range of motion. Neck supple. Carotid bruit is present (L sided. ). No thyromegaly present.  Cardiovascular: Normal rate, regular rhythm, normal heart sounds and intact distal pulses.   No murmur heard. Pulmonary/Chest: Effort normal and breath sounds normal. No respiratory distress. She has no wheezes.  Abdominal: Soft. Bowel sounds are normal. There is no tenderness.  Musculoskeletal: Normal range of motion. She exhibits no edema and no tenderness.  Lymphadenopathy:    She has no cervical adenopathy.  Neurological: She is alert and oriented to person, place, and time.  Skin: Skin is warm and dry. No rash noted.  Psychiatric: She has a normal mood and affect. Her behavior is normal. Thought content normal.   [facility] reading (PRIMARY) by  Dr. Neva Seat: CXR: ? Scarring lower lobes.       Assessment & Plan:  Bethany Clarke is a 57 y.o. female  Routine general medical examination at a health care facility - Plan: IFOBT POC (occult bld, rslt in office), POCT urinalysis dipstick, Comprehensive metabolic panel, CBC with Differential  Chest pain - Plan: EKG 12-Lead, DG Chest 2 View, Ambulatory referral to Cardiology  Insomnia - Plan: clonazePAM (KLONOPIN) 0.5 MG tablet  Tobacco abuse - Plan: varenicline (CHANTIX) 0.5 MG tablet  Unspecified hypothyroidism - Plan: TSH, levothyroxine (SYNTHROID) 100 MCG tablet  Other and unspecified hyperlipidemia - Plan: Lipid panel, atorvastatin (LIPITOR) 20 MG tablet  Fam hx-ischem heart disease - Plan: Ambulatory referral to Cardiology  Situational anxiety - Plan: clonazePAM (KLONOPIN) 0.5 MG tablet    CPE - anticipatory guidance as below.  She plans to call her optho for appt and plans on scheduling mammogram where  prior performed. utd on colonoscopy.   Chest pain- appears to be more epigastric pain, but cv disease risk factors of hyperlipidemia, early fh cad in sister at 44yo, and tobacco abuse.  Will refer to cardiology for eval and possible stress testing. Follow up with GI to discuss other treatment options if pain worsened. Rtc/er precautions.  tobacco abuse - new chantix rx provided.  Precautions discussed regarding any depression or psychosis symptoms and agrees to d/c med and go to ER or rtc if these occur. For irritability - Rx klonopin, usage and sed.   Hx of abnormal chest ct, but stable on recheck in 2008. Tobacco abuse and hx of brother that recently passed with lung ca.  No new cough or dyspnea.  Checked CXR, but to discuss further at follow up. Hx of COPD by record but denies sx's.  No current meds. XR for overread  Insomnia - with anxiety/irritability with tobacco cessation.  Klonopin 0.5mg  BID prn - #45, rtc precautions including any depression sx's.  Understanding expressed.   Hyperlipidemia - refilled lipitor. Check cmp, lipid panel  Hypothyroidism - check TSH, refilled synthroid at current dose.   Carotid bruit - on left.  Prior nonocclusive dz, but may need repeat doppler.  Can discuss with cardiology, or I can order at follow up.   Intermittent dizzy sensations - nonfocal exam and quicly resolve by hx.  ddx of vertigo, orhtostasis. Will check CBC, but to discuss further at follow up.  rtc precautions discussed.  Patient Instructions  Call your eye doctor for an appointment and schedule a mammogram.  I will refer you to a heart doctor to discuss stress testing, but if any new or worsening chest symptoms, go to an emergency room, or call 911.  Recheck in the next few weeks - appointment or walk in to discuss some of the issues from today in more detail and to decide the necessary workup. Return to the clinic or go to the nearest emergency room if any of your symptoms worsen or new  symptoms occur. Your should receive a call or letter about your lab results within the next week to 10 days.    Keeping You Healthy  Get These Tests  Blood Pressure- Have your blood pressure checked by your healthcare provider at least once a year.  Normal blood pressure is 120/80.  Weight- Have your body mass index (BMI) calculated to screen for obesity.  BMI is a measure of body fat based on height and weight.  You can calculate your own BMI at https://www.west-esparza.com/  Cholesterol- Have your cholesterol checked every year.  Diabetes- Have your blood sugar checked every year if you have high blood pressure, high cholesterol, a family history of diabetes or if you are overweight.  Pap Smear- Have a pap smear every 1 to 3 years if you have been sexually active.  If you are older than 65 and recent pap smears have been normal you may not need additional pap smears.  In addition, if you have had a hysterectomy  For benign disease additional pap smears are not necessary.  Mammogram-Yearly mammograms are essential for early detection of breast cancer  Screening for Colon Cancer- Colonoscopy starting at age 64. Screening may begin sooner depending on your family history and other health conditions.  Follow up colonoscopy as directed by your Gastroenterologist.  Screening for Osteoporosis- Screening begins at age 70 with bone density scanning, sooner if you are at higher risk for developing Osteoporosis.  Get these medicines  Calcium with Vitamin D- Your body requires 1200-1500 mg of Calcium a day and [phone] IU of Vitamin D a day.  You can only absorb 500 mg of Calcium at a time therefore Calcium must be taken in 2 or 3 separate doses throughout the day.  Hormones- Hormone therapy has been associated with increased risk for certain cancers and heart disease.  Talk to your healthcare provider about if you need relief from menopausal symptoms.  Aspirin- Ask your healthcare provider about  taking Aspirin to prevent Heart Disease and Stroke.  Get these Immuniztions  Flu shot- Every fall  Pneumonia shot- Once after the age of 51; if you are younger ask your healthcare provider if you need a pneumonia shot.  Tetanus- Every ten years.  Zostavax- Once after the age of 60 to prevent shingles.  Take these steps  Don't smoke- Your healthcare provider can help you quit. For tips on how to quit, ask your healthcare provider or go to www.smokefree.gov or call 1-800 QUIT-NOW.  Be physically active- Exercise 5 days a week for a minimum of 30 minutes.  If you are not already physically active, start slow and gradually work up to 30 minutes of moderate physical activity.  Try walking, dancing, bike riding, swimming, etc.  Eat a healthy diet- Eat a variety of healthy foods such as fruits, vegetables, whole grains, low fat milk, low fat cheeses, yogurt, lean meats, chicken, fish, eggs, dried beans, tofu, etc.  For more information go to www.thenutritionsource.org  Dental visit- Brush and floss teeth twice daily; visit your dentist twice a year.  Eye exam- Visit your Optometrist or Ophthalmologist yearly.  Drink alcohol in moderation- Limit alcohol intake to one drink or less a day.  Never drink and drive.  Depression- Your emotional health is as important as your physical health.  If you're feeling down or losing interest in things you normally enjoy, please talk to your healthcare provider.  Seat Belts- can save your life; always wear one  Smoke/Carbon Monoxide detectors- These detectors need to be installed on the appropriate level of your home.  Replace batteries at least once a year.  Violence- If anyone is threatening or hurting you, please tell your healthcare provider.  Living Will/ Health care power of attorney- Discuss with your healthcare provider and family.

## 2013-01-13 NOTE — Patient Instructions (Addendum)
Call your eye doctor for an appointment and schedule a mammogram.  I will refer you to a heart doctor to discuss stress testing, but if any new or worsening chest symptoms, go to an emergency room, or call 911.  Recheck in the next few weeks - appointment or walk in to discuss some of the issues from today in more detail and to decide the necessary workup. Return to the clinic or go to the nearest emergency room if any of your symptoms worsen or new symptoms occur. Your should receive a call or letter about your lab results within the next week to 10 days.    Keeping You Healthy  Get These Tests  Blood Pressure- Have your blood pressure checked by your healthcare provider at least once a year.  Normal blood pressure is 120/80.  Weight- Have your body mass index (BMI) calculated to screen for obesity.  BMI is a measure of body fat based on height and weight.  You can calculate your own BMI at https://www.west-esparza.com/  Cholesterol- Have your cholesterol checked every year.  Diabetes- Have your blood sugar checked every year if you have high blood pressure, high cholesterol, a family history of diabetes or if you are overweight.  Pap Smear- Have a pap smear every 1 to 3 years if you have been sexually active.  If you are older than 65 and recent pap smears have been normal you may not need additional pap smears.  In addition, if you have had a hysterectomy  For benign disease additional pap smears are not necessary.  Mammogram-Yearly mammograms are essential for early detection of breast cancer  Screening for Colon Cancer- Colonoscopy starting at age 80. Screening may begin sooner depending on your family history and other health conditions.  Follow up colonoscopy as directed by your Gastroenterologist.  Screening for Osteoporosis- Screening begins at age 29 with bone density scanning, sooner if you are at higher risk for developing Osteoporosis.  Get these medicines  Calcium with Vitamin D-  Your body requires 1200-1500 mg of Calcium a day and [phone] IU of Vitamin D a day.  You can only absorb 500 mg of Calcium at a time therefore Calcium must be taken in 2 or 3 separate doses throughout the day.  Hormones- Hormone therapy has been associated with increased risk for certain cancers and heart disease.  Talk to your healthcare provider about if you need relief from menopausal symptoms.  Aspirin- Ask your healthcare provider about taking Aspirin to prevent Heart Disease and Stroke.  Get these Immuniztions  Flu shot- Every fall  Pneumonia shot- Once after the age of 68; if you are younger ask your healthcare provider if you need a pneumonia shot.  Tetanus- Every ten years.  Zostavax- Once after the age of 84 to prevent shingles.  Take these steps  Don't smoke- Your healthcare provider can help you quit. For tips on how to quit, ask your healthcare provider or go to www.smokefree.gov or call 1-800 QUIT-NOW.  Be physically active- Exercise 5 days a week for a minimum of 30 minutes.  If you are not already physically active, start slow and gradually work up to 30 minutes of moderate physical activity.  Try walking, dancing, bike riding, swimming, etc.  Eat a healthy diet- Eat a variety of healthy foods such as fruits, vegetables, whole grains, low fat milk, low fat cheeses, yogurt, lean meats, chicken, fish, eggs, dried beans, tofu, etc.  For more information go to www.thenutritionsource.org  Dental visit-  Brush and floss teeth twice daily; visit your dentist twice a year.  Eye exam- Visit your Optometrist or Ophthalmologist yearly.  Drink alcohol in moderation- Limit alcohol intake to one drink or less a day.  Never drink and drive.  Depression- Your emotional health is as important as your physical health.  If you're feeling down or losing interest in things you normally enjoy, please talk to your healthcare provider.  Seat Belts- can save your life; always wear  one  Smoke/Carbon Monoxide detectors- These detectors need to be installed on the appropriate level of your home.  Replace batteries at least once a year.  Violence- If anyone is threatening or hurting you, please tell your healthcare provider.  Living Will/ Health care power of attorney- Discuss with your healthcare provider and family.

## 2013-01-26 ENCOUNTER — Encounter: Payer: Self-pay | Admitting: Pharmacist Clinician (PhC)/ Clinical Pharmacy Specialist

## 2013-01-27 ENCOUNTER — Telehealth: Payer: Self-pay

## 2013-01-27 DIAGNOSIS — Z720 Tobacco use: Secondary | ICD-10-CM

## 2013-01-27 MED ORDER — VARENICLINE TARTRATE 0.5 MG X 11 & 1 MG X 42 PO MISC: ORAL | Status: DC

## 2013-01-27 NOTE — Telephone Encounter (Signed)
That was the intention. i will send in the starter pack and leave the other prescription for continuing month pak. Thanks.

## 2013-01-27 NOTE — Telephone Encounter (Signed)
Pharm faxed note that pt stated she is supposed to have been Rxd a Chantix Starter Pack. Continuing Pack was sent in. Dr Neva Seat, do you want to Rx a Starter pack and then 1 or 2 continuing packs?

## 2013-01-28 NOTE — Telephone Encounter (Signed)
New Rx was sent in and [facility] contacted pt.

## 2013-02-05 ENCOUNTER — Encounter: Payer: Self-pay | Admitting: Cardiovascular Disease

## 2013-02-12 ENCOUNTER — Other Ambulatory Visit: Payer: Self-pay | Admitting: Family Medicine

## 2013-02-12 DIAGNOSIS — F418 Other specified anxiety disorders: Secondary | ICD-10-CM

## 2013-02-14 NOTE — Telephone Encounter (Signed)
i will refill this one time but instructed at 01/13/13 office visit to follow up with me in a few weeks.  Needs to schedule ov or see me at 102 when on schedule if unable to schedule appt.

## 2013-02-20 ENCOUNTER — Other Ambulatory Visit (HOSPITAL_COMMUNITY): Payer: Self-pay | Admitting: Cardiovascular Disease

## 2013-02-20 DIAGNOSIS — R0989 Other specified symptoms and signs involving the circulatory and respiratory systems: Secondary | ICD-10-CM

## 2013-02-20 DIAGNOSIS — R079 Chest pain, unspecified: Secondary | ICD-10-CM

## 2013-02-25 ENCOUNTER — Other Ambulatory Visit: Payer: Self-pay | Admitting: *Deleted

## 2013-02-25 DIAGNOSIS — E782 Mixed hyperlipidemia: Secondary | ICD-10-CM

## 2013-02-25 DIAGNOSIS — R079 Chest pain, unspecified: Secondary | ICD-10-CM

## 2013-02-25 DIAGNOSIS — R932 Abnormal findings on diagnostic imaging of liver and biliary tract: Secondary | ICD-10-CM

## 2013-02-25 DIAGNOSIS — R1011 Right upper quadrant pain: Secondary | ICD-10-CM

## 2013-02-25 DIAGNOSIS — E039 Hypothyroidism, unspecified: Secondary | ICD-10-CM

## 2013-02-25 DIAGNOSIS — Z79899 Other long term (current) drug therapy: Secondary | ICD-10-CM

## 2013-02-25 DIAGNOSIS — Z5181 Encounter for therapeutic drug level monitoring: Secondary | ICD-10-CM

## 2013-02-26 LAB — COMPREHENSIVE METABOLIC PANEL
AST: 24 U/L (ref 0–37)
BUN: 10 mg/dL (ref 6–23)
CO2: 28 mEq/L (ref 19–32)
Calcium: 10 mg/dL (ref 8.4–10.5)
Chloride: 102 mEq/L (ref 96–112)
Creat: 0.77 mg/dL (ref 0.50–1.10)
Glucose, Bld: 77 mg/dL (ref 70–99)

## 2013-02-26 LAB — CBC
HCT: 38.2 % (ref 36.0–46.0)
Hemoglobin: 13.6 g/dL (ref 12.0–15.0)
MCV: 96.2 fL (ref 78.0–100.0)
RDW: 13.2 % (ref 11.5–15.5)
WBC: 7.1 10*3/uL (ref 4.0–10.5)

## 2013-02-26 LAB — PROTIME-INR
INR: 0.88 (ref <1.50)
Prothrombin Time: 12 seconds (ref 11.6–15.2)

## 2013-02-26 LAB — LIPID PANEL
Cholesterol: 170 mg/dL (ref 0–200)
Triglycerides: 150 mg/dL — ABNORMAL HIGH (ref <150)

## 2013-02-27 ENCOUNTER — Other Ambulatory Visit: Payer: Self-pay | Admitting: Cardiovascular Disease

## 2013-02-27 DIAGNOSIS — R079 Chest pain, unspecified: Secondary | ICD-10-CM

## 2013-02-28 ENCOUNTER — Ambulatory Visit (HOSPITAL_COMMUNITY)
Admission: RE | Admit: 2013-02-28 | Discharge: 2013-02-28 | Disposition: A | Discharge status: Home / Self Care | Payer: BC Managed Care – PPO | Source: Ambulatory Visit | Attending: Cardiovascular Disease | Admitting: Cardiovascular Disease

## 2013-02-28 ENCOUNTER — Ambulatory Visit (HOSPITAL_BASED_OUTPATIENT_CLINIC_OR_DEPARTMENT_OTHER)
Admission: RE | Admit: 2013-02-28 | Discharge: 2013-02-28 | Disposition: A | Discharge status: Home / Self Care | Payer: BC Managed Care – PPO | Source: Ambulatory Visit | Attending: Cardiovascular Disease | Admitting: Cardiovascular Disease

## 2013-02-28 DIAGNOSIS — Z8249 Family history of ischemic heart disease and other diseases of the circulatory system: Secondary | ICD-10-CM | POA: Insufficient documentation

## 2013-02-28 DIAGNOSIS — R0989 Other specified symptoms and signs involving the circulatory and respiratory systems: Secondary | ICD-10-CM | POA: Insufficient documentation

## 2013-02-28 DIAGNOSIS — J449 Chronic obstructive pulmonary disease, unspecified: Secondary | ICD-10-CM | POA: Insufficient documentation

## 2013-02-28 DIAGNOSIS — I059 Rheumatic mitral valve disease, unspecified: Secondary | ICD-10-CM | POA: Insufficient documentation

## 2013-02-28 DIAGNOSIS — R079 Chest pain, unspecified: Secondary | ICD-10-CM

## 2013-02-28 DIAGNOSIS — J4489 Other specified chronic obstructive pulmonary disease: Secondary | ICD-10-CM | POA: Insufficient documentation

## 2013-02-28 NOTE — Progress Notes (Signed)
Carotid duplex complete. [facility] 

## 2013-02-28 NOTE — Progress Notes (Signed)
Upper extremity arterial complete. [facility]

## 2013-02-28 NOTE — Progress Notes (Signed)
[facility]   2D echo completed 02/28/2013.   Cindy Myan Suit, RDCS  

## 2013-03-03 ENCOUNTER — Ambulatory Visit (HOSPITAL_COMMUNITY)
Admission: RE | Admit: 2013-03-03 | Discharge: 2013-03-03 | Disposition: A | Discharge status: Home / Self Care | Payer: BC Managed Care – PPO | Source: Ambulatory Visit | Attending: Cardiovascular Disease | Admitting: Cardiovascular Disease

## 2013-03-03 ENCOUNTER — Encounter (HOSPITAL_COMMUNITY)
Admission: RE | Disposition: A | Discharge status: Home / Self Care | Payer: Self-pay | Source: Ambulatory Visit | Attending: Cardiovascular Disease

## 2013-03-03 DIAGNOSIS — R0989 Other specified symptoms and signs involving the circulatory and respiratory systems: Secondary | ICD-10-CM | POA: Insufficient documentation

## 2013-03-03 DIAGNOSIS — Z7982 Long term (current) use of aspirin: Secondary | ICD-10-CM | POA: Insufficient documentation

## 2013-03-03 DIAGNOSIS — Z8249 Family history of ischemic heart disease and other diseases of the circulatory system: Secondary | ICD-10-CM | POA: Insufficient documentation

## 2013-03-03 DIAGNOSIS — R0609 Other forms of dyspnea: Secondary | ICD-10-CM | POA: Insufficient documentation

## 2013-03-03 DIAGNOSIS — F17200 Nicotine dependence, unspecified, uncomplicated: Secondary | ICD-10-CM | POA: Insufficient documentation

## 2013-03-03 DIAGNOSIS — Z823 Family history of stroke: Secondary | ICD-10-CM | POA: Insufficient documentation

## 2013-03-03 DIAGNOSIS — R079 Chest pain, unspecified: Secondary | ICD-10-CM

## 2013-03-03 DIAGNOSIS — E785 Hyperlipidemia, unspecified: Secondary | ICD-10-CM | POA: Insufficient documentation

## 2013-03-03 DIAGNOSIS — Z79899 Other long term (current) drug therapy: Secondary | ICD-10-CM | POA: Insufficient documentation

## 2013-03-03 HISTORY — PX: CARDIAC CATHETERIZATION: SHX172

## 2013-03-03 HISTORY — PX: LEFT HEART CATHETERIZATION WITH CORONARY ANGIOGRAM: SHX5451

## 2013-03-03 SURGERY — LEFT HEART CATHETERIZATION WITH CORONARY ANGIOGRAM
Anesthesia: Moderate Sedation | Laterality: Bilateral

## 2013-03-03 MED ORDER — SODIUM CHLORIDE 0.9 % IV SOLN: 250.0000 mL | INTRAVENOUS | Status: DC | PRN

## 2013-03-03 MED ORDER — SODIUM CHLORIDE 0.9 % IJ SOLN: 3.0000 mL | Freq: Two times a day (BID) | INTRAMUSCULAR | Status: DC

## 2013-03-03 MED ORDER — MIDAZOLAM HCL 2 MG/2ML IJ SOLN
INTRAMUSCULAR | Status: AC
  Filled 2013-03-03: qty 2

## 2013-03-03 MED ORDER — SODIUM CHLORIDE 0.9 % IV SOLN
INTRAVENOUS | Status: DC
  Administered 2013-03-03: 11:00:00 via INTRAVENOUS

## 2013-03-03 MED ORDER — ATORVASTATIN CALCIUM 20 MG PO TABS: 20.0000 mg | ORAL_TABLET | Freq: Every day | ORAL | Status: DC

## 2013-03-03 MED ORDER — ONDANSETRON HCL 4 MG/2ML IJ SOLN: 4.0000 mg | Freq: Four times a day (QID) | INTRAMUSCULAR | Status: DC | PRN

## 2013-03-03 MED ORDER — FENTANYL CITRATE 0.05 MG/ML IJ SOLN
INTRAMUSCULAR | Status: AC
  Filled 2013-03-03: qty 2

## 2013-03-03 MED ORDER — VARENICLINE TARTRATE 0.5 MG PO TABS: 0.5000 mg | ORAL_TABLET | Freq: Two times a day (BID) | ORAL | Status: DC

## 2013-03-03 MED ORDER — LIDOCAINE HCL (PF) 1 % IJ SOLN
INTRAMUSCULAR | Status: AC
  Filled 2013-03-03: qty 30

## 2013-03-03 MED ORDER — HEPARIN (PORCINE) IN NACL 2-0.9 UNIT/ML-% IJ SOLN
INTRAMUSCULAR | Status: AC
  Filled 2013-03-03: qty 1000

## 2013-03-03 MED ORDER — LEVOTHYROXINE SODIUM 100 MCG PO TABS: 100.0000 ug | ORAL_TABLET | Freq: Every day | ORAL | Status: DC

## 2013-03-03 MED ORDER — SODIUM CHLORIDE 0.9 % IJ SOLN
3.0000 mL | INTRAMUSCULAR | Status: DC | PRN
Start: 2013-03-03 — End: 2013-03-03

## 2013-03-03 MED ORDER — DIAZEPAM 5 MG PO TABS
5.0000 mg | ORAL_TABLET | ORAL | Status: AC
  Administered 2013-03-03: 5 mg via ORAL
  Filled 2013-03-03: qty 1

## 2013-03-03 MED ORDER — ACETAMINOPHEN 325 MG PO TABS: 650.0000 mg | ORAL_TABLET | ORAL | Status: DC | PRN

## 2013-03-03 MED ORDER — ASPIRIN 81 MG PO CHEW
324.0000 mg | CHEWABLE_TABLET | ORAL | Status: AC
  Administered 2013-03-03: 324 mg via ORAL
  Filled 2013-03-03: qty 4

## 2013-03-03 MED ORDER — DEXTROSE-NACL 5-0.45 % IV SOLN
INTRAVENOUS | Status: DC
  Administered 2013-03-03: 07:00:00 via INTRAVENOUS

## 2013-03-03 NOTE — CV Procedure (Signed)
Bethany Clarke is a 57 y.o. female    [id number]  [id number] LOCATION:  FACILITY: [facility]  PHYSICIAN: Lennette Bihari, MD, [facility] [birth year]   DATE OF PROCEDURE:  03/03/2013  DATE OF DISCHARGE:  [facility]  CARDIAC CATHETERIZATION     Indications: Mrs Bethany Clarke is a 57 yo WF with strong FH for premature CAD, a 44 yr h/o tobacco abuse, hyperlipidemia, progressive exertional dyspnea and recent chest pain who presents for diagnostic cardiac catheterization.     PROCEDURE DESCRIPTION:   The patient was brought to the second floor  [place] [facility]iac cath lab in the postabsorptive state.  She was  premedicated with 2 mg versed and 50 micrograms of fentanyl. Her right groin was prepped and shaved in usual sterile fashion. Xylocaine 1% was used  for local anesthesia. A 5 French sheath was inserted into the right femoral artery. Diagnostic catheterizatiion was done with 5 [place] LF4, FR4, No Torque right and pigtail catheters. Left ventriculography was done with 23 cc Omnipaque contrast. Hemostasis was obtained by direct manual compression. The patient tolerated the procedure well.   HEMODYNAMICS:    AO SYSTOLIC/AO DIASTOLIC: 115/59   LV SYSTOLIC/LV DIASTOLIC: 115/14  ANGIOGRAPHIC RESULTS:   1. Left main: long normal vessel which trifurcated into the LAD, RI and LCX vessels. 2. LAD: normal 3. Ramus Intermediate: normal 4. Left circumflex: normal.  5. Right coronary artery: normal dominant. Minimal calcification at ostium.   6.  Left ventriculography: normal LV function; EF 55% without wall motion abnormalities.   IMPRESSION:  Normal LV function                           Normal coronary arteries  Patient tolerated procedure well.  Lennette Bihari, MD, [facility] 03/03/2013 10:08 AM

## 2013-03-03 NOTE — H&P (Signed)
Updated H&P:       See my complete dictated note from office visit of 02/19/13. 58 yo WF with strong FH for premature CAD, 44 yr h/o tobacco abuse, hyperlipidemia . progresive exertional dyspnea and recent chest pain who presents for diagnostic cardiac catheterization. No change in PEx since office visit. Labs reviewed. ECG today with anterior T wave changes. Discussed cath with possible PCI. Pt agrees to proceed. Plan this am. Kaydin Karbowski A 03/03/2013 8:59 AM

## 2013-03-04 ENCOUNTER — Telehealth: Payer: Self-pay | Admitting: Cardiovascular Disease

## 2013-03-04 ENCOUNTER — Other Ambulatory Visit: Payer: Self-pay | Admitting: Family Medicine

## 2013-03-04 DIAGNOSIS — F411 Generalized anxiety disorder: Secondary | ICD-10-CM

## 2013-03-04 NOTE — Telephone Encounter (Signed)
Returned call. Left message to call back.

## 2013-03-04 NOTE — Telephone Encounter (Signed)
Returning [facility] call for test results

## 2013-03-04 NOTE — Telephone Encounter (Signed)
Would like doppler test results from Friday-02-28-13-please!

## 2013-03-04 NOTE — Telephone Encounter (Signed)
Returned call.  Pt informed results will be reviewed by MD and then sent to nurse/CMA to inform pt.  Pt advised to allow time for this process.  Pt verbalized understanding and wants Dr. Tresa Endo to be notified that she called about results.  Pt informed Dr. Tresa Endo will be notified.

## 2013-03-05 ENCOUNTER — Telehealth: Payer: Self-pay

## 2013-03-05 NOTE — Telephone Encounter (Signed)
Spoke with pt, she needs her RX to stop smoking. Faxed RX.

## 2013-03-05 NOTE — Telephone Encounter (Signed)
As noted in my last encounter - plan to follow up in few weeks to discuss some of the concerns briefly discussed last ov. i will refill once, but ov needed before any further refills.

## 2013-03-06 ENCOUNTER — Telehealth: Payer: Self-pay | Admitting: Cardiovascular Disease

## 2013-03-06 NOTE — Telephone Encounter (Signed)
Message forwarded to Dr. Kelly.

## 2013-03-06 NOTE — Telephone Encounter (Signed)
Message along with records given to Dr. Tresa Endo. He called patient.

## 2013-03-06 NOTE — Telephone Encounter (Signed)
Pt states this is her fourth call-very anxious to get her doppler results from 5-16! Please call today!

## 2013-03-06 NOTE — Telephone Encounter (Signed)
Returned call.  Pt concerned about not getting results of dopplers.  Pt wants to talk w/ Dr. Tresa Endo and informed her message would be relayed.  Pt wanted to know if she could come in to get a copy of her results and informed she has a right to get that information by signing a ROI.  Pt stated she would do that and take the results to another doctor to read since she cannot get the results from her doctor.  Pt asked if RN would read results to her an informed they are complex and would need the doctor's interpretation as to what the results mean in regards to her care.  Pt stated she would call back.  Message forwarded to Dr. Tresa Endo.

## 2013-03-06 NOTE — Telephone Encounter (Signed)
I spoke with pt and discussed echo, carotid and UE arterial doppler studies. I will see pt in f/u next week in office. Will need PV study with JB.

## 2013-03-07 NOTE — Telephone Encounter (Signed)
See phone note from 5/22 with additional information.

## 2013-03-11 ENCOUNTER — Ambulatory Visit (INDEPENDENT_AMBULATORY_CARE_PROVIDER_SITE_OTHER): Payer: BC Managed Care – PPO | Admitting: Cardiovascular Disease

## 2013-03-11 ENCOUNTER — Encounter: Payer: Self-pay | Admitting: Cardiovascular Disease

## 2013-03-11 VITALS — HR 63 | Ht 65.0 in | Wt 166.0 lb

## 2013-03-11 DIAGNOSIS — R079 Chest pain, unspecified: Secondary | ICD-10-CM

## 2013-03-11 DIAGNOSIS — I6529 Occlusion and stenosis of unspecified carotid artery: Secondary | ICD-10-CM

## 2013-03-11 DIAGNOSIS — I82B13 Acute embolism and thrombosis of subclavian vein, bilateral: Secondary | ICD-10-CM

## 2013-03-11 DIAGNOSIS — I82B19 Acute embolism and thrombosis of unspecified subclavian vein: Secondary | ICD-10-CM

## 2013-03-11 DIAGNOSIS — E785 Hyperlipidemia, unspecified: Secondary | ICD-10-CM

## 2013-03-11 DIAGNOSIS — I771 Stricture of artery: Secondary | ICD-10-CM

## 2013-03-11 DIAGNOSIS — I6522 Occlusion and stenosis of left carotid artery: Secondary | ICD-10-CM

## 2013-03-11 DIAGNOSIS — R42 Dizziness and giddiness: Secondary | ICD-10-CM

## 2013-03-11 DIAGNOSIS — Z01818 Encounter for other preprocedural examination: Secondary | ICD-10-CM

## 2013-03-11 MED ORDER — ATORVASTATIN CALCIUM 40 MG PO TABS: 40.0000 mg | ORAL_TABLET | Freq: Every day | ORAL | Status: DC

## 2013-03-11 NOTE — Progress Notes (Signed)
Patient ID: [name], female   DOB: 1956-05-31, 57 y.o.   MRN: [id number]  HPI: Bethany Clarke, is a 57 y.o. female who presents for followup evaluation following her recent cardiac catheterization. The patient is a 57 year old female, who has a history of hypothyroidism and hyperlipidemia since 1999. In September 2013, she apparently had an endoscopy for epigastric discomfort and was treated with Nexium. She also was told to have sludge in her gallbladder and some inflammation of her bile duct, for which she has seen Dr. Marina Goodell. The patient has a very strong family history for coronary artery disease with a sister, who underwent CABG surgery at age 57, as well as a father, who died at a young age with cancer but also had heart disease, and also many family members with stroke and heart disease. The patient admits to a 44 year history of tobacco use, having started at age 37. She has noticed recurrent episodes of epigastric and somewhat right-sided chest discomfort. She also has noticed a progressive decline in activity with development of exertional shortness of breath. Approximately three weeks ago, she was awakened from sleep with a lower sternal epigastric discomfort, which radiated to her back and was associated with jaw discomfort. She was evaluated at urgent care. In light of her strong family history and progressive symptomatology after my initial evaluation on 02/19/2013, and a cardiac catheterization was performed on Mar 03, 2013. His reveal normal LV function without definite obstructive disease. Her left artery system was entirely normal. She did have mild ostial calcification of her right coronary artery without stenosis.      Bethany Clarke admits to arm fatigue and weakness. When I saw her initially on may 7,2014 she did have a 30 mm blood pressure differential between her right and left arm. Subsequent carotid duplex imaging suggested proximal left subclavian occlusion. She did have 0-49%  stenosis in the right internal carotid artery and had progressive stenosis in her left mid internal carotid now showing a 50-69% diameter reduction. She had retrograde flow in her left vertebral artery suggestive of subclavian steal. In retrospect, she does admit to episodes of dizziness. Upper extremity duplex imaging revealed a 38 mm reduction in left brachial artery pressure.  A 2-D echo Doppler study showed ejection fraction of 55-60% with normal diastolic parameters and mildly thickened mitral valve leaflets appear. I did contact Bethany Clarke after her Doppler studies were completed. She presents to the office today to further discuss potential peripheral vascular catheterization and potential for intervention in the left subclavian artery.    ALLERGIES: SHE DENIES ANY ALLERGIES.    Past Medical History  Diagnosis Date  . GERD (gastroesophageal reflux disease)   . Hypothyroid   . Dizziness   . Syncope, near   . Heart burn   . COPD (chronic obstructive pulmonary disease)   . Emphysema of lung   . H. pylori infection   . Atherosclerosis 11/14/2005    carotid US - mild calcific non-stenotic plaque bilaterally  . Carotid bruit 02/05/2009    Doppler - R/L ICAs 0-49% diameter reductin (velocities suggest low-mid range); L subclavian 0-49%  diameter reduction    Past Surgical History  Procedure Laterality Date  . Abdominal hysterectomy      No Known Allergies  Current Outpatient Prescriptions  Medication Sig Dispense Refill  . aspirin 81 MG tablet Take 81 mg by mouth daily.      Marland Kitchen atorvastatin (LIPITOR) 20 MG tablet Take 1 tablet (20 mg total) by mouth daily.  90 tablet  1  . clonazePAM (KLONOPIN) 0.5 MG tablet TAKE 1 TABLET BY MOUTH TWICE A DAY AS NEEDED FOR ANXIETY  30 tablet  0  . levothyroxine (SYNTHROID) 100 MCG tablet Take 1 tablet (100 mcg total) by mouth daily.  90 tablet  1  . varenicline (CHANTIX) 0.5 MG tablet Take 1 tablet (0.5 mg total) by mouth 2 (two) times daily.  60  tablet  1   No current facility-administered medications for this visit.    SOCIAL HISTORY: She is married, has three children. She works for [employer], who is also my patient, who recommended my name for the patient to see. The patient does not routinely exercise. There is no alcohol use. She had smoked for 44 years, and quit smoking 4 weeks ago.  REVIEW OF SYSTEMS: Negative for fever, chills, and night sweats. At times, she does note some lightheadedness. She does wear glasses. She denies visual changes. She has a history of hypothyroidism. She has a history of hyperlipidemia. She does admit to shortness of breath with activity. She has noticed this epigastric and lower sternal chest discomfort with jaw radiation. She does note intermittent indigestion. She does note some occasional nausea. She denies significant edema. She denies paresthesias. Other system review is negative.   PE Pulse 63  Ht 5\' 5"  (1.651 m)  Wt 166 lb (75.297 kg)  BMI 27.62 kg/m2 Blood pressure was 40981 in the right arm, but was 75/50 in the left arm. She was normocephalic and atraumatic. She did have facial wrinkles. Sclerae was anicteric. There was no lid lag. Fundoscopic exam did not reveal any hemorrhages or exudates. She did not have any nasal septal hypertrophy. Mallampati scale is a 2/3. There was a bruit above the left clavicle/carotid. There was no JVD. Her lungs revealed slightly decreased breath sounds without wheezes. She did not have chest wall tenderness. Rhythm was regular. There was a 1/6 systolic murmur. Abdomen was soft. She did not have hepatosplenomegaly. Abdominal aorta was normal by palpation. R groin catherization site was normal. Distal pulses were adequate. She had negative Homans' sign. There was no clubbing, cyanosis, or edema.  Neurologic: grossly nonfocal  ECG: NSR at 63, anterior T wave abnormality  LABS:  BMET    Component Value Date/Time   NA 139 02/25/2013 1440   K 4.5 02/25/2013  1440   CL 102 02/25/2013 1440   CO2 28 02/25/2013 1440   GLUCOSE 77 02/25/2013 1440   BUN 10 02/25/2013 1440   CREATININE 0.77 02/25/2013 1440   CREATININE 0.59 07/15/2012 1254   CALCIUM 10.0 02/25/2013 1440   GFRNONAA >90 07/15/2012 1254   GFRAA >90 07/15/2012 1254     Hepatic Function Panel     Component Value Date/Time   PROT 7.4 02/25/2013 1440   ALBUMIN 4.3 02/25/2013 1440   AST 24 02/25/2013 1440   ALT 52* 02/25/2013 1440   ALKPHOS 78 02/25/2013 1440   BILITOT 0.3 02/25/2013 1440   BILIDIR 0.1 12/25/2012 1042   IBILI NOT CALCULATED 07/15/2012 1503     CBC    Component Value Date/Time   WBC 7.1 02/25/2013 1440   RBC 3.97 02/25/2013 1440   HGB 13.6 02/25/2013 1440   HCT 38.2 02/25/2013 1440   PLT 302 02/25/2013 1440   MCV 96.2 02/25/2013 1440   MCH 34.3* 02/25/2013 1440   MCHC 35.6 02/25/2013 1440   RDW 13.2 02/25/2013 1440   LYMPHSABS 2.2 01/13/2013 1629   MONOABS 0.4 01/13/2013 1629  EOSABS 0.0 01/13/2013 1629   BASOSABS 0.0 01/13/2013 1629     BNP No results found for this basename: probnp    Lipid Panel     Component Value Date/Time   CHOL 170 02/25/2013 1440   TRIG 150* 02/25/2013 1440   HDL 53 02/25/2013 1440   CHOLHDL 3.2 02/25/2013 1440   VLDL 30 02/25/2013 1440   LDLCALC 87 02/25/2013 1440     RADIOLOGY: No results found.    ASSESSMENT AND PLAN: Bethany Clarke as documented normal coronary arteries with the exception of ostial calcification of her right coronary artery noted at cardiac catheterization last week. Physical examination reveals at least a greater than 30 mm blood pressure reduction between her right and left arm and her Doppler study are suggestive of occlusion of her left subclavian artery with retrograde filling of her left vertebral system. She has symptoms suggestive of subclavian steal and also does note significant left arm weakness the I did discuss her findings with Dr. Allyson Sabal. We'll try to schedule her to undergo peripheral angiography with possible  stenting of her subclavian artery to be done this week by Dr. Allyson Sabal. I discussed the procedure in detail with the patient. In light of her peripheral vascular disease, I am recommending further titration of her Lipitor to 40 mg to achieve a target LDL less than 70.      Lennette Bihari, MD, [facility]  03/11/2013 8:59 AM

## 2013-03-11 NOTE — Patient Instructions (Addendum)
  Your physician has requested that you have a procedure to fix your subclavian arteries.  Your physician recommends that you schedule a follow-up appointment  Following procedure with Dr. Allyson Sabal.  Your physician has recommended you make the following change in your medication: Increase your atorvastatin to 40mg .

## 2013-03-12 ENCOUNTER — Encounter: Payer: Self-pay | Admitting: *Deleted

## 2013-03-12 ENCOUNTER — Ambulatory Visit
Admission: RE | Admit: 2013-03-12 | Discharge: 2013-03-12 | Disposition: A | Discharge status: Home / Self Care | Payer: BC Managed Care – PPO | Source: Ambulatory Visit | Attending: Cardiovascular Disease | Admitting: Cardiovascular Disease

## 2013-03-12 ENCOUNTER — Other Ambulatory Visit: Payer: Self-pay | Admitting: *Deleted

## 2013-03-12 DIAGNOSIS — Z01818 Encounter for other preprocedural examination: Secondary | ICD-10-CM

## 2013-03-12 LAB — BASIC METABOLIC PANEL
BUN: 12 mg/dL (ref 6–23)
Calcium: 10.2 mg/dL (ref 8.4–10.5)
Creat: 0.71 mg/dL (ref 0.50–1.10)

## 2013-03-12 LAB — CBC
Hemoglobin: 12.8 g/dL (ref 12.0–15.0)
Platelets: 320 10*3/uL (ref 150–400)
RBC: 3.84 MIL/uL — ABNORMAL LOW (ref 3.87–5.11)
WBC: 4.3 10*3/uL (ref 4.0–10.5)

## 2013-03-12 LAB — PROTIME-INR
INR: 0.94 (ref <1.50)
Prothrombin Time: 12.6 seconds (ref 11.6–15.2)

## 2013-03-12 LAB — APTT: aPTT: 41 seconds — ABNORMAL HIGH (ref 24–37)

## 2013-03-13 ENCOUNTER — Encounter (HOSPITAL_COMMUNITY): Payer: Self-pay | Admitting: Pharmacy Technician

## 2013-03-14 ENCOUNTER — Ambulatory Visit (HOSPITAL_COMMUNITY)
Admission: RE | Admit: 2013-03-14 | Discharge: 2013-03-15 | Disposition: A | Discharge status: Home / Self Care | Payer: BC Managed Care – PPO | Source: Ambulatory Visit | Attending: Cardiovascular Disease | Admitting: Cardiovascular Disease

## 2013-03-14 ENCOUNTER — Encounter (HOSPITAL_COMMUNITY): Payer: Self-pay | Admitting: General Practice

## 2013-03-14 ENCOUNTER — Encounter (HOSPITAL_COMMUNITY)
Admission: RE | Disposition: A | Discharge status: Home / Self Care | Payer: Self-pay | Source: Ambulatory Visit | Attending: Cardiovascular Disease

## 2013-03-14 DIAGNOSIS — Z79899 Other long term (current) drug therapy: Secondary | ICD-10-CM | POA: Insufficient documentation

## 2013-03-14 DIAGNOSIS — I6529 Occlusion and stenosis of unspecified carotid artery: Secondary | ICD-10-CM | POA: Insufficient documentation

## 2013-03-14 DIAGNOSIS — G458 Other transient cerebral ischemic attacks and related syndromes: Secondary | ICD-10-CM

## 2013-03-14 DIAGNOSIS — J4489 Other specified chronic obstructive pulmonary disease: Secondary | ICD-10-CM | POA: Insufficient documentation

## 2013-03-14 DIAGNOSIS — J449 Chronic obstructive pulmonary disease, unspecified: Secondary | ICD-10-CM | POA: Diagnosis present

## 2013-03-14 DIAGNOSIS — Z87891 Personal history of nicotine dependence: Secondary | ICD-10-CM | POA: Insufficient documentation

## 2013-03-14 DIAGNOSIS — Z01818 Encounter for other preprocedural examination: Secondary | ICD-10-CM

## 2013-03-14 DIAGNOSIS — E039 Hypothyroidism, unspecified: Secondary | ICD-10-CM | POA: Diagnosis present

## 2013-03-14 DIAGNOSIS — E785 Hyperlipidemia, unspecified: Secondary | ICD-10-CM | POA: Insufficient documentation

## 2013-03-14 DIAGNOSIS — I771 Stricture of artery: Secondary | ICD-10-CM | POA: Insufficient documentation

## 2013-03-14 HISTORY — PX: BILATERAL UPPER EXTREMITY ANGIOGRAM: SHX5503

## 2013-03-14 HISTORY — DX: Pneumonia, unspecified organism: J18.9

## 2013-03-14 HISTORY — DX: Unspecified chronic bronchitis: J42

## 2013-03-14 HISTORY — PX: SUBCLAVIAN ARTERY STENT: SHX2452

## 2013-03-14 HISTORY — PX: PERCUTANEOUS STENT INTERVENTION: SHX5500

## 2013-03-14 HISTORY — DX: Hyperlipidemia, unspecified: E78.5

## 2013-03-14 LAB — POCT ACTIVATED CLOTTING TIME
Activated Clotting Time: 241 seconds
Activated Clotting Time: 203 seconds
Activated Clotting Time: 143 seconds

## 2013-03-14 SURGERY — BILATERAL UPPER EXTREMITY ANGIOGRAM
Anesthesia: LOCAL

## 2013-03-14 MED ORDER — ONDANSETRON HCL 4 MG/2ML IJ SOLN: 4.0000 mg | Freq: Four times a day (QID) | INTRAMUSCULAR | Status: DC | PRN

## 2013-03-14 MED ORDER — ASPIRIN 81 MG PO CHEW
CHEWABLE_TABLET | ORAL | Status: AC
  Filled 2013-03-14: qty 4

## 2013-03-14 MED ORDER — ASPIRIN EC 325 MG PO TBEC
325.0000 mg | DELAYED_RELEASE_TABLET | Freq: Every day | ORAL | Status: DC
  Filled 2013-03-14 (×2): qty 1

## 2013-03-14 MED ORDER — ATORVASTATIN CALCIUM 40 MG PO TABS
40.0000 mg | ORAL_TABLET | Freq: Every day | ORAL | Status: DC
  Administered 2013-03-15: 09:00:00 40 mg via ORAL
  Filled 2013-03-14 (×3): qty 1

## 2013-03-14 MED ORDER — ACETAMINOPHEN 500 MG PO TABS
1000.0000 mg | ORAL_TABLET | Freq: Every evening | ORAL | Status: DC | PRN
  Filled 2013-03-14: qty 2

## 2013-03-14 MED ORDER — SODIUM CHLORIDE 0.9 % IJ SOLN: 3.0000 mL | INTRAMUSCULAR | Status: DC | PRN

## 2013-03-14 MED ORDER — MORPHINE SULFATE 2 MG/ML IJ SOLN
2.0000 mg | INTRAMUSCULAR | Status: DC | PRN
  Administered 2013-03-14: 12:00:00 2 mg via INTRAVENOUS
  Filled 2013-03-14: qty 1

## 2013-03-14 MED ORDER — LIDOCAINE HCL (PF) 1 % IJ SOLN
INTRAMUSCULAR | Status: AC
  Filled 2013-03-14: qty 30

## 2013-03-14 MED ORDER — ACETAMINOPHEN 325 MG PO TABS: 650.0000 mg | ORAL_TABLET | ORAL | Status: DC | PRN

## 2013-03-14 MED ORDER — ASPIRIN 81 MG PO CHEW
324.0000 mg | CHEWABLE_TABLET | ORAL | Status: AC
  Administered 2013-03-14: 324 mg via ORAL

## 2013-03-14 MED ORDER — PANTOPRAZOLE SODIUM 40 MG PO TBEC
40.0000 mg | DELAYED_RELEASE_TABLET | Freq: Every day | ORAL | Status: DC
  Administered 2013-03-14 – 2013-03-15 (×2): 40 mg via ORAL
  Filled 2013-03-14 (×2): qty 1

## 2013-03-14 MED ORDER — DIPHENHYDRAMINE-APAP (SLEEP) 25-500 MG PO TABS: 4.0000 | ORAL_TABLET | Freq: Every evening | ORAL | Status: DC | PRN

## 2013-03-14 MED ORDER — SODIUM CHLORIDE 0.9 % IV SOLN
INTRAVENOUS | Status: DC
  Administered 2013-03-14: 1000 mL via INTRAVENOUS

## 2013-03-14 MED ORDER — LEVOTHYROXINE SODIUM 100 MCG PO TABS
100.0000 ug | ORAL_TABLET | Freq: Every day | ORAL | Status: DC
  Administered 2013-03-15: 100 ug via ORAL
  Filled 2013-03-14 (×2): qty 1

## 2013-03-14 MED ORDER — CLONAZEPAM 0.5 MG PO TABS
0.5000 mg | ORAL_TABLET | Freq: Two times a day (BID) | ORAL | Status: DC | PRN
  Administered 2013-03-14 – 2013-03-15 (×2): 0.5 mg via ORAL
  Filled 2013-03-14 (×2): qty 1

## 2013-03-14 MED ORDER — ALUM & MAG HYDROXIDE-SIMETH 200-200-20 MG/5ML PO SUSP
30.0000 mL | ORAL | Status: DC | PRN
  Filled 2013-03-14: qty 30

## 2013-03-14 MED ORDER — FENTANYL CITRATE 0.05 MG/ML IJ SOLN
INTRAMUSCULAR | Status: AC
  Filled 2013-03-14: qty 2

## 2013-03-14 MED ORDER — DIAZEPAM 5 MG PO TABS
5.0000 mg | ORAL_TABLET | ORAL | Status: AC
  Administered 2013-03-14: 5 mg via ORAL

## 2013-03-14 MED ORDER — SODIUM CHLORIDE 0.9 % IJ SOLN: 3.0000 mL | Freq: Two times a day (BID) | INTRAMUSCULAR | Status: DC

## 2013-03-14 MED ORDER — SODIUM CHLORIDE 0.9 % IV SOLN
INTRAVENOUS | Status: AC
  Administered 2013-03-14: 12:00:00 via INTRAVENOUS

## 2013-03-14 MED ORDER — FAMOTIDINE IN NACL 20-0.9 MG/50ML-% IV SOLN
INTRAVENOUS | Status: AC
  Filled 2013-03-14: qty 50

## 2013-03-14 MED ORDER — DIPHENHYDRAMINE HCL 25 MG PO CAPS
50.0000 mg | ORAL_CAPSULE | Freq: Every evening | ORAL | Status: DC | PRN
  Administered 2013-03-14: 50 mg via ORAL
  Filled 2013-03-14: qty 2

## 2013-03-14 MED ORDER — ASPIRIN EC 81 MG PO TBEC: 81.0000 mg | DELAYED_RELEASE_TABLET | Freq: Every day | ORAL | Status: DC

## 2013-03-14 MED ORDER — SODIUM CHLORIDE 0.9 % IV SOLN: 250.0000 mL | INTRAVENOUS | Status: DC | PRN

## 2013-03-14 MED ORDER — CLOPIDOGREL BISULFATE 300 MG PO TABS
ORAL_TABLET | ORAL | Status: AC
  Filled 2013-03-14: qty 1

## 2013-03-14 MED ORDER — HEPARIN (PORCINE) IN NACL 2-0.9 UNIT/ML-% IJ SOLN
INTRAMUSCULAR | Status: AC
  Filled 2013-03-14: qty 1000

## 2013-03-14 MED ORDER — DIAZEPAM 5 MG PO TABS
ORAL_TABLET | ORAL | Status: AC
  Filled 2013-03-14: qty 1

## 2013-03-14 MED ORDER — VARENICLINE TARTRATE 0.5 MG PO TABS
0.5000 mg | ORAL_TABLET | Freq: Two times a day (BID) | ORAL | Status: DC
  Administered 2013-03-14 – 2013-03-15 (×2): 0.5 mg via ORAL
  Filled 2013-03-14 (×4): qty 1

## 2013-03-14 MED ORDER — CLOPIDOGREL BISULFATE 75 MG PO TABS
75.0000 mg | ORAL_TABLET | Freq: Every day | ORAL | Status: DC
  Administered 2013-03-15: 09:00:00 75 mg via ORAL
  Filled 2013-03-14: qty 1

## 2013-03-14 MED ORDER — MIDAZOLAM HCL 2 MG/2ML IJ SOLN
INTRAMUSCULAR | Status: AC
  Filled 2013-03-14: qty 2

## 2013-03-14 NOTE — Progress Notes (Signed)
Utilization Review Completed Misty Rago J. Quinne Pires, RN, BSN, NCM [phone]  

## 2013-03-14 NOTE — H&P (Signed)
    Pt was reexamined and existing H & P reviewed. No changes found.  [name], MD [facility] 03/14/2013 7:53 AM

## 2013-03-14 NOTE — CV Procedure (Signed)
Bethany Clarke is a 57 y.o. female    [id number] LOCATION:  FACILITY: [facility]  PHYSICIAN: Nanetta Batty, M.D. Sep 26, 1956   DATE OF PROCEDURE:  03/14/2013  DATE OF DISCHARGE:  [facility]  PV Intervention     History obtained from chart review.Bethany Clarke, is a 57 y.o. female who presents for followup evaluation following her recent cardiac catheterization. The patient is a 57 year old female, who has a history of hypothyroidism and hyperlipidemia since 1999. In September 2013, she apparently had an endoscopy for epigastric discomfort and was treated with Nexium. She also was told to have sludge in her gallbladder and some inflammation of her bile duct, for which she has seen Dr. Marina Goodell. The patient has a very strong family history for coronary artery disease with a sister, who underwent CABG surgery at age 54, as well as a father, who died at a young age with cancer but also had heart disease, and also many family members with stroke and heart disease. The patient admits to a 44 year history of tobacco use, having started at age 26. She has noticed recurrent episodes of epigastric and somewhat right-sided chest discomfort. She also has noticed a progressive decline in activity with development of exertional shortness of breath. Approximately three weeks ago, she was awakened from sleep with a lower sternal epigastric discomfort, which radiated to her back and was associated with jaw discomfort. She was evaluated at urgent care. In light of her strong family history and progressive symptomatology after my initial evaluation on 02/19/2013, and a cardiac catheterization was performed on Mar 03, 2013. His reveal normal LV function without definite obstructive disease. Her left artery system was entirely normal. She did have mild ostial calcification of her right coronary artery without stenosis.  Bethany Clarke admits to arm fatigue and weakness. When I saw her initially on  may 7,2014 she did have a 30 mm blood pressure differential between her right and left arm. Subsequent carotid duplex imaging suggested proximal left subclavian occlusion. She did have 0-49% stenosis in the right internal carotid artery and had progressive stenosis in her left mid internal carotid now showing a 50-69% diameter reduction. She had retrograde flow in her left vertebral artery suggestive of subclavian steal. In retrospect, she does admit to episodes of dizziness. Upper extremity duplex imaging revealed a 38 mm reduction in left brachial artery pressure.  A 2-D echo Doppler study showed ejection fraction of 55-60% with normal diastolic parameters and mildly thickened mitral valve leaflets appear. I did contact Bethany Clarke after her Doppler studies were completed. She presents to the office today to further discuss potential peripheral vascular catheterization and potential for intervention in the left subclavian artery.     PROCEDURE DESCRIPTION:    The patient was brought to the second floor [place] [facility] cath lab in the postabsorptive state. She was premedicated with Valium 5 mg by mouth, IV Versed and fentanyl. Her right groin was prepped and shaved in usual sterile fashion. Xylocaine 1% was used for local anesthesia. A 5 French sheath was inserted into the right common femoral artery using standard Seldinger technique. The 5 French pigtail catheter was used for arch angiography. A 5 [place] JB 1 catheter was used for selective left subclavian artery angiography.Visipaque was used for the entirety of the case. Retrograde aortic pressure was monitored during the case. A total of 74 cc of contrast was administered to the patient.   HEMODYNAMICS:    AO SYSTOLIC/AO DIASTOLIC: 129/75  ANGIOGRAPHIC RESULTS:   1: Aortic arch angiogram-this was a bovine arch. There was a 95-99% proximal eccentric left subclavian artery stenosis with retrograde filling of the left vertebral with delayed  imaging suggesting subclavian steal.  Procedure description: patient received 5000 units of heparin intravenously and an ACT of 243 initially falling to 203 by the end of the case. The lesion was crossed with an Building surveyor. The JB1 catheter and 5 French sheath were then exchanged over the wire for a 6 French 90 cm long sheath. The proximal left subclavian artery stenosis was primarily stented with a 6 mm x 19 mm long Abbott balloon expandable stent. This was post dilated with a 7 mm x 2 cm balloon at nominal pressures resulting in reduction of a 99% eccentric proximal left subclavian artery stenosis to 0% residual with excellent antegrade flow the left vertebral and down the left internal mammary artery. The patient tolerated the procedure well. The long 6 French sheath was exchanged over the wire for a short 6 [place] sheath which was secured. The patient left the lab in stable condition. She received 300 mg of by mouth Plavix prior to leaving,  IMPRESSION:successful PTA and stenting of high-grade proximal left subclavian artery stenosis for subclavian steal/claudication. Patient will be hydrated overnight. She'll be discharged home on aspirin and Plavix. She'll get followup Dopplers of her upper extremities and will see me back.  [name] MD, [facility] 03/14/2013 8:55 AM

## 2013-03-14 NOTE — Progress Notes (Signed)
Site area: right groin  Site Prior to Removal:  Level 0  Pressure Applied For 20 MINUTES    Minutes Beginning at 1240  Manual:   yes  Patient Status During Pull:  stable  Post Pull Groin Site:  Level 0  Post Pull Instructions Given:  yes  Post Pull Pulses Present:  yes  Dressing Applied:  yes  Comments:

## 2013-03-15 ENCOUNTER — Other Ambulatory Visit: Payer: Self-pay | Admitting: Physician Assistant

## 2013-03-15 DIAGNOSIS — I739 Peripheral vascular disease, unspecified: Secondary | ICD-10-CM

## 2013-03-15 LAB — CBC
Platelets: 272 10*3/uL (ref 150–400)
RBC: 3.68 MIL/uL — ABNORMAL LOW (ref 3.87–5.11)
WBC: 5.4 10*3/uL (ref 4.0–10.5)

## 2013-03-15 LAB — BASIC METABOLIC PANEL
Calcium: 9.9 mg/dL (ref 8.4–10.5)
GFR calc non Af Amer: >90 mL/min (ref >90)
Sodium: 142 mEq/L (ref 135–145)

## 2013-03-15 MED ORDER — ASPIRIN 325 MG PO TBEC: 325.0000 mg | DELAYED_RELEASE_TABLET | Freq: Every day | ORAL | Status: DC

## 2013-03-15 MED ORDER — CLOPIDOGREL BISULFATE 75 MG PO TABS: 75.0000 mg | ORAL_TABLET | Freq: Every day | ORAL | Status: DC

## 2013-03-15 NOTE — Progress Notes (Signed)
Subjective:  SAYS FEELS MORE 'BALANCED'  Objective:  Temp:  [97.4 F (36.3 C)-98 F (36.7 C)] 98 F (36.7 C) (05/31 0435) Pulse Rate:  [59-72] 66 (05/31 0435) Resp:  [17-18] 18 (05/31 0435) BP: (97-132)/(62-75) 112/63 mmHg (05/31 0435) SpO2:  [94 %-100 %] 94 % (05/31 0435) Weight:  [172 lb 2.9 oz (78.1 kg)] 172 lb 2.9 oz (78.1 kg) (05/31 0000) Weight change: 6 lb 2.9 oz (2.803 kg)  Intake/Output from previous day: 05/30 0701 - 05/31 0700 In: 240 [P.O.:240] Out: 1500 [Urine:1500]  Intake/Output from this shift:    Physical Exam: General appearance: alert, cooperative and no distress Neck: no adenopathy, no JVD, supple, symmetrical, trachea midline, thyroid not enlarged, symmetric, no tenderness/mass/nodules and there is a moderate intensity bruit LSCA area Lungs: clear to auscultation bilaterally Heart: regular rate and rhythm, S1, S2 normal, no murmur, click, rub or gallop Extremities: extremities normal, atraumatic, no cyanosis or edema and right femoral puncture site OK  Lab Results: Results for orders placed during the hospital encounter of 03/14/13 (from the past 48 hour(s))  POCT ACTIVATED CLOTTING TIME     Status: None   Collection Time    03/14/13  8:18 AM      Result Value Range   Activated Clotting Time 241    POCT ACTIVATED CLOTTING TIME     Status: None   Collection Time    03/14/13  8:41 AM      Result Value Range   Activated Clotting Time 203    POCT ACTIVATED CLOTTING TIME     Status: None   Collection Time    03/14/13 10:47 AM      Result Value Range   Activated Clotting Time 143    BASIC METABOLIC PANEL     Status: None   Collection Time    03/15/13  5:25 AM      Result Value Range   Sodium 142  135 - 145 mEq/L   Potassium 4.2  3.5 - 5.1 mEq/L   Chloride 103  96 - 112 mEq/L   CO2 30  19 - 32 mEq/L   Glucose, Bld 88  70 - 99 mg/dL   BUN 10  6 - 23 mg/dL   Creatinine, Ser 1.61  0.50 - 1.10 mg/dL   Calcium 9.9  8.4 - 09.6 mg/dL   GFR calc non  Af Amer >90  >90 mL/min   GFR calc Af Amer >90  >90 mL/min   Comment:            The eGFR has been calculated     using the CKD EPI equation.     This calculation has not been     validated in all clinical     situations.     eGFR's persistently     <90 mL/min signify     possible Chronic Kidney Disease.  CBC     Status: Abnormal   Collection Time    03/15/13  5:25 AM      Result Value Range   WBC 5.4  4.0 - 10.5 K/uL   RBC 3.68 (*) 3.87 - 5.11 MIL/uL   Hemoglobin 12.4  12.0 - 15.0 g/dL   HCT 04.5  40.9 - 81.1 %   MCV 99.5  78.0 - 100.0 fL   MCH 33.7  26.0 - 34.0 pg   MCHC 33.9  30.0 - 36.0 g/dL   RDW 91.4  78.2 - 95.6 %   Platelets 272  150 - 400  K/uL    Imaging: Imaging results have been reviewed  Assessment/Plan:   1. Active Problems: 2.   * No active hospital problems. * 3.   Time Spent Directly with Patient:  20 minutes  Length of Stay:  LOS: 1 day   S/P LSCA PTA/Stent. Right groin OK. Moderate intensity bruit Left neck (subclavian area).Labs OK. D/C home on ASA/Plavix. Will get UE dopplers next 1-2 weeks then back to see me after that.  [name] 03/15/2013, 7:15 AM

## 2013-03-16 DIAGNOSIS — G458 Other transient cerebral ischemic attacks and related syndromes: Secondary | ICD-10-CM | POA: Diagnosis present

## 2013-03-16 NOTE — Discharge Summary (Signed)
Physician Discharge Summary  Patient ID: Bethany Clarke MRN: [id number] DOB/AGE: 01/15/1956 57 y.o.  Admit date: 03/14/2013 Discharge date: 03/16/2013  Admission Diagnoses: Subclavian steal syndrome  Discharge Diagnoses:  Principal Problem:   Subclavian steal syndrome Active Problems:   HYPOTHYROIDISM   COPD   Subclavian arterial stenosis   Carotid stenosis   Hyperlipidemia LDL goal < 70   Discharged Condition: stable  Hospital Course:   The patient is a 57 year old female, who has a history of hypothyroidism and hyperlipidemia since 1999. In September 2013, she had an endoscopy for epigastric discomfort and was treated with Nexium. She also was told to have sludge in her gallbladder and some inflammation of her bile duct, for which she has seen Dr. Marina Goodell. The patient has a very strong family history for coronary artery disease with a sister, who underwent CABG surgery at age 37, as well as a father, who died at a young age with cancer but also had heart disease, and also many family members with stroke and heart disease. The patient admits to a 44 year history of tobacco use, having started at age 78. She has noticed recurrent episodes of epigastric and somewhat right-sided chest discomfort. She also has noticed a progressive decline in activity with development of exertional shortness of breath. Approximately three weeks ago, she was awakened from sleep with a lower sternal epigastric discomfort, which radiated to her back and was associated with jaw discomfort. She was evaluated at urgent care. In light of her strong family history and progressive symptomatology, cardiac evaluation was requested. Her catheterization revealed normal coronary arteries normal LV function. Bethany Clarke admits to arm fatigue and weakness. When I saw her initially on may 7,2014 she did have a 30 mm blood pressure differential between her right and left arm. Subsequent carotid duplex imaging suggested proximal left  subclavian occlusion. She did have 0-49% stenosis in the right internal carotid artery and had progressive stenosis in her left mid internal carotid now showing a 50-69% diameter reduction. She had retrograde flow in her left vertebral artery suggestive of subclavian steal. In retrospect, she does admit to episodes of dizziness. Upper extremity duplex imaging revealed a 38 mm reduction in left brachial artery pressure.   Patient presented for upper extremity angiogram and underwent successful PTA and stenting of high-grade proximal left subclavian artery stenosis for subclavian steal/claudication. She was start on aspirin Plavix. She was seen by Dr. Allyson Sabal felt she was stable for discharge home. Upper extremity arterial Dopplers will be scheduled at [facility] vascular.      Consults: None  Significant Diagnostic Studies:  Peripheral vascular intervention HEMODYNAMICS:  AO SYSTOLIC/AO DIASTOLIC: 129/75  ANGIOGRAPHIC RESULTS:  1: Aortic arch angiogram-this was a bovine arch. There was a 95-99% proximal eccentric left subclavian artery stenosis with retrograde filling of the left vertebral with delayed imaging suggesting subclavian steal.  Procedure description: patient received 5000 units of heparin intravenously and an ACT of 243 initially falling to 203 by the end of the case. The lesion was crossed with an Psychologist, educational. The JB1 catheter and 5 French sheath were then exchanged over the wire for a 6 French 90 cm long sheath. The proximal left subclavian artery stenosis was primarily stented with a 6 mm x 19 mm long Abbott balloon expandable stent. This was post dilated with a 7 mm x 2 cm balloon at nominal pressures resulting in reduction of a 99% eccentric proximal left subclavian artery stenosis to 0% residual with excellent antegrade  flow the left vertebral and down the left internal mammary artery. The patient tolerated the procedure well. The long 6 French sheath was exchanged over  the wire for a short 6 [place] sheath which was secured. The patient left the lab in stable condition. She received 300 mg of by mouth Plavix prior to leaving,  IMPRESSION:successful PTA and stenting of high-grade proximal left subclavian artery stenosis for subclavian steal/claudication. Patient will be hydrated overnight. She'll be discharged home on aspirin and Plavix. She'll get followup Dopplers of her upper extremities and will see me back.  [name] MD, [facility]  03/14/2013 BMET    Component Value Date/Time   NA 142 03/15/2013 0525   K 4.2 03/15/2013 0525   CL 103 03/15/2013 0525   CO2 30 03/15/2013 0525   GLUCOSE 88 03/15/2013 0525   BUN 10 03/15/2013 0525   CREATININE 0.75 03/15/2013 0525   CREATININE 0.71 03/11/2013 0725   CALCIUM 9.9 03/15/2013 0525   GFRNONAA >90 03/15/2013 0525   GFRAA >90 03/15/2013 0525    CBC    Component Value Date/Time   WBC 5.4 03/15/2013 0525   RBC 3.68* 03/15/2013 0525   HGB 12.4 03/15/2013 0525   HCT 36.6 03/15/2013 0525   PLT 272 03/15/2013 0525   MCV 99.5 03/15/2013 0525   MCH 33.7 03/15/2013 0525   MCHC 33.9 03/15/2013 0525   RDW 12.4 03/15/2013 0525   LYMPHSABS 2.2 01/13/2013 1629   MONOABS 0.4 01/13/2013 1629   EOSABS 0.0 01/13/2013 1629   BASOSABS 0.0 01/13/2013 1629      Treatments: See above  Discharge Exam: Blood pressure 117/80, pulse 70, temperature 98.2 F (36.8 C), temperature source Oral, resp. rate 18, height 5\' 5"  (1.651 m), weight 172 lb 2.9 oz (78.1 kg), SpO2 95.00%.   Disposition: 01-Home or Self Care      Discharge Orders   Future Appointments Provider Department Dept Phone   03/17/2013 3:00 PM [facility] [address] [facility] [address] [phone]   Future Orders Complete By Expires     Diet - low sodium heart healthy  As directed     Discharge instructions  As directed     Comments:      No lifting more than a half gallon of milk or driving for three days    Increase activity slowly  As directed          Medication List    TAKE these medications       aspirin 325 MG EC tablet  Take 1 tablet (325 mg total) by mouth daily.     atorvastatin 40 MG tablet  Commonly known as:  LIPITOR  Take 40 mg by mouth daily.     clonazePAM 0.5 MG tablet  Commonly known as:  KLONOPIN  Take 0.5 mg by mouth 2 (two) times daily as needed for anxiety.     clopidogrel 75 MG tablet  Commonly known as:  PLAVIX  Take 1 tablet (75 mg total) by mouth daily with breakfast.     diphenhydramine-acetaminophen 25-500 MG Tabs  Commonly known as:  TYLENOL PM  Take 4 tablets by mouth at bedtime as needed (for sleep).     levothyroxine 100 MCG tablet  Commonly known as:  SYNTHROID  Take 1 tablet (100 mcg total) by mouth daily.     varenicline 0.5 MG tablet  Commonly known as:  CHANTIX  Take 1 tablet (0.5 mg total) by mouth 2 (two) times daily.  Follow-up Information   Follow up with [name], MD. (Our office will call you with the appt dates and times.)    Contact information:   [address] [place] [address] [phone]       Signed: Wilburt Finlay 03/16/2013, 12:27 PM

## 2013-03-17 ENCOUNTER — Ambulatory Visit (HOSPITAL_COMMUNITY)
Admission: RE | Admit: 2013-03-17 | Discharge: 2013-03-17 | Disposition: A | Discharge status: Home / Self Care | Payer: BC Managed Care – PPO | Source: Ambulatory Visit | Attending: Cardiovascular Disease | Admitting: Cardiovascular Disease

## 2013-03-17 DIAGNOSIS — I739 Peripheral vascular disease, unspecified: Secondary | ICD-10-CM | POA: Insufficient documentation

## 2013-03-17 DIAGNOSIS — I771 Stricture of artery: Secondary | ICD-10-CM | POA: Insufficient documentation

## 2013-03-17 NOTE — Progress Notes (Signed)
Upper Extremity Duplex Completed. Bethany Clarke

## 2013-03-26 ENCOUNTER — Encounter: Payer: Self-pay | Admitting: Cardiovascular Disease

## 2013-03-26 ENCOUNTER — Ambulatory Visit (INDEPENDENT_AMBULATORY_CARE_PROVIDER_SITE_OTHER): Payer: BC Managed Care – PPO | Admitting: Cardiovascular Disease

## 2013-03-26 VITALS — BP 118/60 | HR 82 | Ht 65.0 in | Wt 163.6 lb

## 2013-03-26 DIAGNOSIS — I708 Atherosclerosis of other arteries: Secondary | ICD-10-CM

## 2013-03-26 DIAGNOSIS — I739 Peripheral vascular disease, unspecified: Secondary | ICD-10-CM

## 2013-03-26 DIAGNOSIS — I771 Stricture of artery: Secondary | ICD-10-CM | POA: Insufficient documentation

## 2013-03-26 NOTE — Assessment & Plan Note (Signed)
Since her PTA and stent procedure her left upper decortication is markedly improved. She does occasionally get dizzy. Followup Dopplers were completely normal. Continue to check her carotid and laboratory Dopplers semiannually.

## 2013-03-26 NOTE — Progress Notes (Signed)
03/26/2013 Gilman Buttner   [birth year]  [id number]  Primary Physician [name], MD Primary Cardiologist: [name] MD Bethany Clarke   HPI:  Bethany Clarke, is a 57 y.o. female who presents for followup evaluation following her recent cardiac catheterization. The patient is a 57 year old female, who has a history of hypothyroidism and hyperlipidemia since 1999. In September 2013, she apparently had an endoscopy for epigastric discomfort and was treated with Nexium. She also was told to have sludge in her gallbladder and some inflammation of her bile duct, for which she has seen Dr. Marina Goodell. The patient has a very strong family history for coronary artery disease with a sister, who underwent CABG surgery at age 64, as well as a father, who died at a young age with cancer but also had heart disease, and also many family members with stroke and heart disease. The patient admits to a 44 year history of tobacco use, having started at age 27. She has noticed recurrent episodes of epigastric and somewhat right-sided chest discomfort. She also has noticed a progressive decline in activity with development of exertional shortness of breath. Approximately three weeks ago, she was awakened from sleep with a lower sternal epigastric discomfort, which radiated to her back and was associated with jaw discomfort. She was evaluated at urgent care. In light of her strong family history and progressive symptomatology after my initial evaluation on 02/19/2013, and a cardiac catheterization was performed on Mar 03, 2013. His reveal normal LV function without definite obstructive disease. Her left artery system was entirely normal. She did have mild ostial calcification of her right coronary artery without stenosis.  [name] admits to arm fatigue and weakness. When I saw her initially on may 7,2014 she did have a 30 mm blood pressure differential between her right and left arm. Subsequent carotid  duplex imaging suggested proximal left subclavian occlusion. She did have 0-49% stenosis in the right internal carotid artery and had progressive stenosis in her left mid internal carotid now showing a 50-69% diameter reduction. She had retrograde flow in her left vertebral artery suggestive of subclavian steal. In retrospect, she does admit to episodes of dizziness. Upper extremity duplex imaging revealed a 38 mm reduction in left brachial artery pressure.  A 2-D echo Doppler study showed ejection fraction of 55-60% with normal diastolic parameters and mildly thickened mitral valve leaflets appear today to further discuss potential peripheral vascular catheterization and potential for intervention in the left subclavian artery.  She underwent aortic arch angiography and bilateral subclavian angiography revealing a 99% proximal left subclavian artery stenosis with retrograde left vertebral filling. I started her proximal/ostial left renal artery with excellent angiographic results. She was discharged on the following day. Her followup Dopplers were normal.    Current Outpatient Prescriptions  Medication Sig Dispense Refill  . aspirin EC 325 MG EC tablet Take 1 tablet (325 mg total) by mouth daily.  30 tablet  0  . atorvastatin (LIPITOR) 40 MG tablet Take 40 mg by mouth daily.      . clonazePAM (KLONOPIN) 0.5 MG tablet Take 0.5 mg by mouth 2 (two) times daily as needed for anxiety.      . clopidogrel (PLAVIX) 75 MG tablet Take 1 tablet (75 mg total) by mouth daily with breakfast.  30 tablet  11  . diphenhydramine-acetaminophen (TYLENOL PM) 25-500 MG TABS Take 4 tablets by mouth at bedtime as needed (for sleep).      Marland Kitchen levothyroxine (SYNTHROID) 100 MCG tablet Take 1  tablet (100 mcg total) by mouth daily.  90 tablet  1  . varenicline (CHANTIX) 0.5 MG tablet Take 1 tablet (0.5 mg total) by mouth 2 (two) times daily.  60 tablet  1   No current facility-administered medications for this visit.    No  Known Allergies  History   Social History  . Marital Status: Married    Spouse Name: N/A    Number of Children: 3  . Years of Education: N/A   Occupational History  . 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 Smoking status: Former Smoker -- 0.50 packs/day for 40 years    Types: Cigarettes    Quit date: 02/11/2013  . Smokeless tobacco: Never Used  . Alcohol Use: No  . Drug Use: No  . Sexually Active: Yes     Comment: number of sex partners in the last 12 months  1   Other Topics Concern  . Not on file   Social History Narrative   Daily caffeine      Review of Systems: General: negative for chills, fever, night sweats or weight changes.  Cardiovascular: negative for chest pain, dyspnea on exertion, edema, orthopnea, palpitations, paroxysmal nocturnal dyspnea or shortness of breath Dermatological: negative for rash Respiratory: negative for cough or wheezing Urologic: negative for hematuria Abdominal: negative for nausea, vomiting, diarrhea, bright red blood per rectum, melena, or hematemesis Neurologic: negative for visual changes, syncope, or dizziness All other systems reviewed and are otherwise negative except as noted above.    Blood pressure 118/60, pulse 82, height 5\' 5"  (1.651 m), weight 163 lb 9.6 oz (74.208 kg).  General appearance: alert and no distress Neck: no adenopathy, no carotid bruit, no JVD, supple, symmetrical, trachea midline and thyroid not enlarged, symmetric, no tenderness/mass/nodules Lungs: clear to auscultation bilaterally Heart: regular rate and rhythm, S1, S2 normal, no murmur, click, rub or gallop Extremities: extremities normal, atraumatic, no cyanosis or edema  EKG not performed today  ASSESSMENT AND PLAN:   Subclavian artery stenosis, left Since her PTA and stent procedure her left upper decortication is markedly improved. She does occasionally get dizzy. Followup Dopplers were completely normal. Continue to check her carotid  and laboratory Dopplers semiannually.      [name] MD FACP,FACC,FAHA, [facility] 03/26/2013 3:35 PM

## 2013-03-26 NOTE — Patient Instructions (Addendum)
  We will see you back in follow up in 6 months  Dr Allyson Sabal has ordered carotid dopplers and upper extremity dopplers to be done in 6 months

## 2013-04-02 ENCOUNTER — Other Ambulatory Visit: Payer: Self-pay | Admitting: Family Medicine

## 2013-04-02 DIAGNOSIS — G4700 Insomnia, unspecified: Secondary | ICD-10-CM

## 2013-04-02 DIAGNOSIS — F418 Other specified anxiety disorders: Secondary | ICD-10-CM

## 2013-04-02 NOTE — Telephone Encounter (Signed)
Call pt - i refilled klonopin, but would like follow up prior to next refill.

## 2013-04-18 ENCOUNTER — Other Ambulatory Visit: Payer: Self-pay | Admitting: Family Medicine

## 2013-04-18 NOTE — Telephone Encounter (Signed)
Forward to Dr. Greene. 

## 2013-04-20 NOTE — Telephone Encounter (Signed)
i can refill this - but it appears that #30 Rx on 04/02/13. How often is she taking this medicine, and how many left currently?

## 2013-04-21 ENCOUNTER — Telehealth: Payer: Self-pay | Admitting: Radiology

## 2013-04-21 DIAGNOSIS — G4700 Insomnia, unspecified: Secondary | ICD-10-CM

## 2013-04-21 DIAGNOSIS — F418 Other specified anxiety disorders: Secondary | ICD-10-CM

## 2013-04-21 NOTE — Telephone Encounter (Signed)
Please advise patient requesting refill on Clonazepam.

## 2013-04-22 ENCOUNTER — Telehealth: Payer: Self-pay

## 2013-04-22 MED ORDER — CLONAZEPAM 0.5 MG PO TABS: ORAL_TABLET | ORAL | Status: DC

## 2013-04-22 NOTE — Telephone Encounter (Signed)
Refilled, but recommended follow up in a few weeks at March OV, but I know she has been busy with various cardiology interventions and visits. Would like her to rtc in next month to discuss this med further to determine if at correct strength or if on correct medication. Let me know if there are questions.

## 2013-04-22 NOTE — Telephone Encounter (Signed)
Pharm reqs RF of clonazepam 0.5 mg BID prn.

## 2013-04-23 NOTE — Telephone Encounter (Signed)
Called patient, she indicates she will make appt, she is transferred to schedule, she is asking for more than 15 d. Supply, please advise.

## 2013-04-28 ENCOUNTER — Encounter: Payer: Self-pay | Admitting: Family Medicine

## 2013-04-28 ENCOUNTER — Ambulatory Visit (INDEPENDENT_AMBULATORY_CARE_PROVIDER_SITE_OTHER): Payer: BC Managed Care – PPO | Admitting: Family Medicine

## 2013-04-28 VITALS — BP 130/80 | HR 59 | Temp 97.9°F | Resp 16 | Ht 64.5 in | Wt 171.6 lb

## 2013-04-28 DIAGNOSIS — F17200 Nicotine dependence, unspecified, uncomplicated: Secondary | ICD-10-CM

## 2013-04-28 DIAGNOSIS — R05 Cough: Secondary | ICD-10-CM

## 2013-04-28 DIAGNOSIS — F418 Other specified anxiety disorders: Secondary | ICD-10-CM

## 2013-04-28 DIAGNOSIS — F489 Nonpsychotic mental disorder, unspecified: Secondary | ICD-10-CM

## 2013-04-28 DIAGNOSIS — R0602 Shortness of breath: Secondary | ICD-10-CM

## 2013-04-28 DIAGNOSIS — R059 Cough, unspecified: Secondary | ICD-10-CM

## 2013-04-28 DIAGNOSIS — J449 Chronic obstructive pulmonary disease, unspecified: Secondary | ICD-10-CM

## 2013-04-28 DIAGNOSIS — Z720 Tobacco use: Secondary | ICD-10-CM

## 2013-04-28 LAB — PULMONARY FUNCTION TEST

## 2013-04-28 MED ORDER — TIOTROPIUM BROMIDE MONOHYDRATE 18 MCG IN CAPS: 18.0000 ug | ORAL_CAPSULE | Freq: Every day | RESPIRATORY_TRACT | Status: DC

## 2013-04-28 MED ORDER — CLONAZEPAM 0.5 MG PO TABS: ORAL_TABLET | ORAL | Status: DC

## 2013-04-28 NOTE — Patient Instructions (Addendum)
Recheck in 2 months.  Recommend starting spiriva for your breathing - COPD.  Please call me to discuss this further if needed.  You can also talk to smoking cessation clinic through [facility] for other helpful hints with remaining off cigarettes. recheck in next 2 months. Return to the clinic or go to the nearest emergency room if any of your symptoms worsen or new symptoms occur.\

## 2013-04-28 NOTE — Progress Notes (Signed)
Subjective:    Patient ID: Bethany Clarke, female    DOB: November 19, 1955, 57 y.o.   MRN: [id number]  HPI Bethany Clarke is a 57 y.o. female  Hx of hypthyroidism, hyperlipidemia, situational anxiety and PAD/PVD as below. Other per Problem list. Here for follow up today.   Hypothyroidism - TSH 1.26 on Feb 25, 2013. Stable from CPE 2 months prior.   Hyperlipidemia - now on lipitor 40mg  qhs - increased . CMP wnl except ALT 44 on 01/13/13 labs. Lipids controlled in 01-04-2023 with HDL 55, LDL 92.   Anxiety - see physical in 2023/01/04 -  Brother passed away 2022-11-28.  Declined counseing or problems with this, and would not see counselor at that time, but was having trouble getting to sleep at times, with mind racing - takes tylenol pm - noted these symptoms before brother passing. Sleeping 7 1/2 to 10 hours at that time. Now states Klonopin once per day -during weekends at 10am for anxiety.  2 a day during the week due to stress at work - 9:30, then 6pm most days of the week. Will not meet with counselor.  Strong person and will not listen to other people's opinions. Does not believe in psychiatrist or psychologist and will not see them.  Discussed SSRI - does not want to start this at present.  Would like to try exercise and continued quitting smoking 1st and follow up in 2 months.   Tobacco abuse - chantix rx at 2013/01/03 physical. Had taken this is in past, but only for few weeks. Quit April 30th, if upset - eats -so some weight gain.  Not exercising yet, but planning on restarting exercise. Still craving cigarettes. Mimics with straw. Less than 1/2 ppd when smoking.   Short of breath at times going up stairs. Not at rest. Cough at times.  Feels more since quitting smoking.  Has had heart cath - no blockages in May.  No use of inhaler, but hx COPD by chart, and hx of bronchitis every other year. Has required bronchidilators, and breathing treatment. Last used 2 years ago.    Hx of PVD, subclavian steal syndrome,  carotid stenosis - followed by Dr. Allyson Sabal at [facility].  Admitted 03/14/13 - per that hospitalization: hx of 30 mm blood pressure differential between her right and left arm. Subsequent carotid duplex imaging suggested proximal left subclavian occlusion. She did have 0-49% stenosis in the right internal carotid artery and had progressive stenosis in her left mid internal carotid now showing a 50-69% diameter reduction. She had retrograde flow in her left vertebral artery suggestive of subclavian steal. Did admit to episodes of dizziness. Upper extremity duplex imaging revealed a 38 mm reduction in left brachial artery pressure.  Patient presented for upper extremity angiogram and underwent successful PTA and stenting of high-grade proximal left subclavian artery stenosis for subclavian steal/claudication. She was started on aspirin,  Plavix. Follow up OV 03/26/13 with cardiologist:  "her left upper decortication is markedly improved. She does occasionally get dizzy. Followup Dopplers were completely normal. Continue to check her carotid and laboratory Dopplers semiannually."  Feels better, less dizzy. Can use arms much better.  Occasional pain in front of ankles.    Review of Systems     Objective:   Physical Exam  Vitals reviewed. Constitutional: She is oriented to person, place, and time. She appears well-developed and well-nourished. No distress.  HENT:  Head: Normocephalic and atraumatic.  Right Ear: Hearing, tympanic membrane and ear canal normal.  Left  Ear: Hearing, tympanic membrane and ear canal normal.  Eyes: Conjunctivae and EOM are normal. Pupils are equal, round, and reactive to light.  Neck: Carotid bruit is not present.  Cardiovascular: Normal rate, regular rhythm, normal heart sounds and intact distal pulses.   No murmur heard. Pulmonary/Chest: Effort normal and breath sounds normal. No respiratory distress. She has no wheezes. She has no rhonchi.  Abdominal: Soft. She exhibits no  pulsatile midline mass. There is no tenderness.  Neurological: She is alert and oriented to person, place, and time.  Skin: Skin is warm and dry. No rash noted.     Psychiatric: She has a normal mood and affect. Her behavior is normal. Judgment and thought content normal.   Spirometry - see scanned.  Moderate obstruction with FVC 69%, FEV1% 54%, FEV1/FVC: 77        Assessment & Plan:  Bethany Clarke is a 57 y.o. female   Anxiety - persistent, situational.  Refused counseling as above, and would not see psychiatrist or psychologist.  Discussed concern of only using benzodiazepine everyday, and twice per day during the week is not best chronic treatment of anxiety, and risks of addiction/habit forming nature Klonopin.  Discussed SSRI for anxiety and to help with psychologic component of difficulty with tobacco cessation, but this was also declined at present. Would like to try walking first. Can try this for next 2 months (#45 and 1 refill given), but reinforced that longterm daily use of this medicine was not liklely going to be feasible. Will need repeat OV in 2 months.  Tobacco abuse - suspect that Chantix is helping physical component/cravings, but deficiency in psychologic component. Recommended SSRi as above or counseling, but declined. Can call smoking cessation clinic for further assistance.  Klonopin as above for now.   Dyspnea - COPD likey given tobacco use hx, previous hyperinflated XR, and based on spirometry readings.  Trial Spiriva 1 cap qd, and recheck in next 2 months. Appeared to be upset at end of visit - when discussing Spiriva. Tried to obtain further info and to discuss any concerns. Appeared to be due in part to this medicine was used by her brother, and concern of having to take this medicine for COPD, but did not want to talk anymore about this and said she was fine. Encouraged to call to discuss concerns about  this medicine and again reason for starting medicine due to  her dyspnea and spirometry results, but othre options may be available. I also called her at 6:50 pm to see if she wanted to discuss this further, but reached her voicemail.   Episodic ankle pain - front of ankle - doubt claudication, but with hx of PVD - pt will call cardiologist to eval this.  Meds ordered this encounter  Medications  . clonazePAM (KLONOPIN) 0.5 MG tablet    Sig: TAKE 1 TABLET BY MOUTH TWICE DAILY AS NEEDED FOR ANXIETY    Dispense:  45 tablet    Refill:  1  . tiotropium (SPIRIVA HANDIHALER) 18 MCG inhalation capsule    Sig: Place 1 capsule (18 mcg total) into inhaler and inhale daily.    Dispense:  30 capsule    Refill:  2   Patient Instructions  Recheck in 2 months.  Recommend starting spiriva for your breathing - COPD.  Please call me to discuss this further if needed.  You can also talk to smoking cessation clinic through [facility] for other helpful hints with remaining off cigarettes. recheck  in next 2 months. Return to the clinic or go to the nearest emergency room if any of your symptoms worsen or new symptoms occur.\

## 2013-04-29 ENCOUNTER — Telehealth: Payer: Self-pay

## 2013-04-29 NOTE — Telephone Encounter (Signed)
PT WANTED TO LET DR Neva Seat KNOW SHE APPRECIATED HIM CALLING TO CHECK ON HER LAST NIGHT AND SHE IS FINE, WILL DO WHAT SHE HAVE TO DO. DIDN'T NEED A CALL BACK BUT HER NUMBER IS [phone] IF NEEDED

## 2013-05-01 ENCOUNTER — Encounter: Payer: Self-pay | Admitting: Cardiovascular Disease

## 2013-05-01 ENCOUNTER — Ambulatory Visit (INDEPENDENT_AMBULATORY_CARE_PROVIDER_SITE_OTHER): Payer: BC Managed Care – PPO | Admitting: Cardiovascular Disease

## 2013-05-01 VITALS — BP 124/80 | HR 56 | Ht 65.0 in | Wt 170.0 lb

## 2013-05-01 DIAGNOSIS — M79672 Pain in left foot: Secondary | ICD-10-CM

## 2013-05-01 DIAGNOSIS — I771 Stricture of artery: Secondary | ICD-10-CM

## 2013-05-01 DIAGNOSIS — M79609 Pain in unspecified limb: Secondary | ICD-10-CM

## 2013-05-01 DIAGNOSIS — I708 Atherosclerosis of other arteries: Secondary | ICD-10-CM

## 2013-05-01 DIAGNOSIS — M79671 Pain in right foot: Secondary | ICD-10-CM

## 2013-05-01 NOTE — Progress Notes (Signed)
05/01/2013 Gilman Buttner   [birth year]  [id number]  Primary Physician [name], MD Primary Cardiologist: [name] MD [name]   HPI:  Bethany Clarke, is a 57 y.o. female who presents for followup evaluation following her recent cardiac catheterization. The patient is a 57 year old female, who has a history of hypothyroidism and hyperlipidemia since 1999. In September 2013, she apparently had an endoscopy for epigastric discomfort and was treated with Nexium. She also was told to have sludge in her gallbladder and some inflammation of her bile duct, for which she has seen Dr. Marina Goodell. The patient has a very strong family history for coronary artery disease with a sister, who underwent CABG surgery at age 46, as well as a father, who died at a young age with cancer but also had heart disease, and also many family members with stroke and heart disease. The patient admits to a 44 year history of tobacco use, having started at age 22. She has noticed recurrent episodes of epigastric and somewhat right-sided chest discomfort. She also has noticed a progressive decline in activity with development of exertional shortness of breath. Approximately three weeks ago, she was awakened from sleep with a lower sternal epigastric discomfort, which radiated to her back and was associated with jaw discomfort. She was evaluated at urgent care. In light of her strong family history and progressive symptomatology after my initial evaluation on 02/19/2013, and a cardiac catheterization was performed on Mar 03, 2013. His reveal normal LV function without definite obstructive disease. Her left artery system was entirely normal. She did have mild ostial calcification of her right coronary artery without stenosis.  Bethany Clarke admits to arm fatigue and weakness. When I saw her initially on may 7,2014 she did have a 30 mm blood pressure differential between her right and left arm. Subsequent carotid  duplex imaging suggested proximal left subclavian occlusion. She did have 0-49% stenosis in the right internal carotid artery and had progressive stenosis in her left mid internal carotid now showing a 50-69% diameter reduction. She had retrograde flow in her left vertebral artery suggestive of subclavian steal. In retrospect, she does admit to episodes of dizziness. Upper extremity duplex imaging revealed a 38 mm reduction in left brachial artery pressure.  A 2-D echo Doppler study showed ejection fraction of 55-60% with normal diastolic parameters and mildly thickened mitral valve leaflets appear today to further discuss potential peripheral vascular catheterization and potential for intervention in the left subclavian artery.  She underwent aortic arch angiography and bilateral subclavian angiography revealing a 99% proximal left subclavian artery stenosis with retrograde left vertebral filling. I started her proximal/ostial left subclavian artery with excellent angiographic results. She was discharged on the following day. Her followup Dopplers were normal. Since discharge she no longer has MA claudication. Her dizziness has resolved as well. She presents today for to evaluate bilateral foot pain that is new over the last 2 weeks. The pain is in the anterior portion of her foot records for seconds at a time without relationship to activity. There is a neuropathic component. Her exam is completely normal and she has 2+ pedal pulses.    Current Outpatient Prescriptions  Medication Sig Dispense Refill  . aspirin EC 325 MG EC tablet Take 1 tablet (325 mg total) by mouth daily.  30 tablet  0  . atorvastatin (LIPITOR) 40 MG tablet Take 40 mg by mouth daily.      . clonazePAM (KLONOPIN) 0.5 MG tablet TAKE 1 TABLET BY MOUTH  TWICE DAILY AS NEEDED FOR ANXIETY  45 tablet  1  . clopidogrel (PLAVIX) 75 MG tablet Take 1 tablet (75 mg total) by mouth daily with breakfast.  30 tablet  11  .  diphenhydramine-acetaminophen (TYLENOL PM) 25-500 MG TABS Take 4 tablets by mouth at bedtime as needed (for sleep).      Marland Kitchen levothyroxine (SYNTHROID) 100 MCG tablet Take 1 tablet (100 mcg total) by mouth daily.  90 tablet  1  . varenicline (CHANTIX) 0.5 MG tablet Take 1 tablet (0.5 mg total) by mouth 2 (two) times daily.  60 tablet  1  . tiotropium (SPIRIVA HANDIHALER) 18 MCG inhalation capsule Place 1 capsule (18 mcg total) into inhaler and inhale daily.  30 capsule  2   No current facility-administered medications for this visit.    No Known Allergies  History   Social History  . Marital Status: Married    Spouse Name: N/A    Number of Children: 3  . Years of Education: N/A   Occupational History  . 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 Smoking status: Former Smoker -- 0.50 packs/day for 40 years    Types: Cigarettes    Quit date: 02/11/2013  . Smokeless tobacco: Never Used  . Alcohol Use: No  . Drug Use: No  . Sexually Active: Yes     Comment: number of sex partners in the last 12 months  1   Other Topics Concern  . Not on file   Social History Narrative   Daily caffeine      Review of Systems: General: negative for chills, fever, night sweats or weight changes.  Cardiovascular: negative for chest pain, dyspnea on exertion, edema, orthopnea, palpitations, paroxysmal nocturnal dyspnea or shortness of breath Dermatological: negative for rash Respiratory: negative for cough or wheezing Urologic: negative for hematuria Abdominal: negative for nausea, vomiting, diarrhea, bright red blood per rectum, melena, or hematemesis Neurologic: negative for visual changes, syncope, or dizziness All other systems reviewed and are otherwise negative except as noted above.    Blood pressure 124/80, pulse 56, height 5\' 5"  (1.651 m), weight 170 lb (77.111 kg).  General appearance: alert and no distress Neck: no adenopathy, no carotid bruit, no JVD, supple, symmetrical,  trachea midline and thyroid not enlarged, symmetric, no tenderness/mass/nodules Lungs: clear to auscultation bilaterally Heart: regular rate and rhythm, S1, S2 normal, no murmur, click, rub or gallop Extremities: extremities normal, atraumatic, no cyanosis or edema  EKG not performed today  ASSESSMENT AND PLAN:   Bilateral foot pain Since I saw her roughly a month ago she developed a typical bilateral foot pain that occurs randomly not associated with activity. The pain is on the ventral portion of her foot and last for seconds at a time. There is a neuropathic component. Exam shows 2+ pedal pulses. I reviewed sure that this is not vascular.  Subclavian artery stenosis, left Further procedure she denies upper decortication. Her upper gym he Dopplers have normalized and her dizziness has resolved as well.      [name] MD FACP,FACC,FAHA, [facility] 05/01/2013 10:48 AM

## 2013-05-01 NOTE — Assessment & Plan Note (Signed)
Further procedure she denies upper decortication. Her upper gym he Dopplers have normalized and her dizziness has resolved as well.

## 2013-05-01 NOTE — Patient Instructions (Addendum)
Your physician wants you to follow-up in:  6 months. You will receive a reminder letter in the mail two months in advance. If you don't receive a letter, please call our office to schedule the follow-up appointment.   

## 2013-05-01 NOTE — Assessment & Plan Note (Signed)
Since I saw her roughly a month ago she developed a typical bilateral foot pain that occurs randomly not associated with activity. The pain is on the ventral portion of her foot and last for seconds at a time. There is a neuropathic component. Exam shows 2+ pedal pulses. I reviewed sure that this is not vascular.

## 2013-05-25 ENCOUNTER — Other Ambulatory Visit: Payer: Self-pay | Admitting: Family Medicine

## 2013-05-27 ENCOUNTER — Encounter: Payer: Self-pay | Admitting: Family Medicine

## 2013-05-30 NOTE — Telephone Encounter (Signed)
Rx was sent  

## 2013-06-13 ENCOUNTER — Other Ambulatory Visit: Payer: Self-pay | Admitting: Family Medicine

## 2013-06-16 ENCOUNTER — Other Ambulatory Visit: Payer: Self-pay | Admitting: Radiology

## 2013-06-24 ENCOUNTER — Ambulatory Visit: Payer: BC Managed Care – PPO | Admitting: Family Medicine

## 2013-06-24 VITALS — BP 132/74 | HR 78 | Temp 98.4°F | Resp 18 | Ht 65.0 in | Wt 173.0 lb

## 2013-06-24 DIAGNOSIS — R209 Unspecified disturbances of skin sensation: Secondary | ICD-10-CM

## 2013-06-24 LAB — POCT GLYCOSYLATED HEMOGLOBIN (HGB A1C): Hemoglobin A1C: 5.4

## 2013-06-24 NOTE — Progress Notes (Signed)
[facility] [address] [place] [address] [phone]  Date:  06/24/2013   Name:  Bethany Clarke   DOB:  [birth year]   MRN:  [id number]  PCP:  [name], MD    Chief Complaint: heat going right leg   History of Present Illness:  Bethany Clarke is a 57 y.o. very pleasant female patient who presents with the following:  She is here today with a right leg complaint.  She has noted a feeling like "hot water running down my leg'" over the last month. It can occur several times a day.  The sensation lasts for up to approx 10 seconds.  It only occurs in the anterior thigh, only in the right leg.   She has not noted any weakness or numbness, no particular back pain.   Her foot does not go to sleep.    The left leg is fine.   In May of this year she had a heart cath- through her femoral area.  Coronaries were clear. However, she was found to have a subclavian artery stenosis and had a stent placed on 5/30.   She has quit smoking since this stent Also noted to have some carotid stenosis   She is otherwise generally healthy.   Patient Active Problem List   Diagnosis Date Noted  . Bilateral foot pain 05/01/2013  . Subclavian artery stenosis, left   . Subclavian steal syndrome 03/16/2013  . Subclavian arterial stenosis 03/11/2013  . Carotid stenosis 03/11/2013  . Hyperlipidemia LDL goal < 70 03/11/2013  . GERD (gastroesophageal reflux disease)   . Hypothyroid   . Dizziness   . Syncope, near   . Heart burn   . HYPOTHYROIDISM 05/28/2007  . ASTHMA 05/28/2007  . COPD 05/28/2007    Past Medical History  Diagnosis Date  . GERD (gastroesophageal reflux disease)   . Hypothyroid   . Dizziness   . Syncope, near   . Heart burn   . H. pylori infection   . Atherosclerosis 11/14/2005    carotid US - mild calcific non-stenotic plaque bilaterally  . Carotid bruit 02/05/2009    Doppler - R/L ICAs 0-49% diameter reductin (velocities suggest low-mid range); L  subclavian 0-49%  diameter reduction  . Lipidemia   . Emphysema of lung   . Pneumonia ~ 2009  . Chronic bronchitis     "growing up; not anymore" (03/14/2013)  . Subclavian artery stenosis, left     subclavian steal physiology status post PTA and stenting    Past Surgical History  Procedure Laterality Date  . Abdominal hysterectomy  1999  . Subclavian artery stent Left 03/14/2013     for subclavian steal/claudication (03/14/2013)  . Dilation and curettage of uterus  1980's  . Tubal ligation  1980's  . Breast biopsy Right 1990's    "benign" (03/14/2013)  . Cardiac catheterization  03/03/2013    History  Substance Use Topics  . Smoking status: Former Smoker -- 0.50 packs/day for 40 years    Types: Cigarettes    Quit date: 02/11/2013  . Smokeless tobacco: Never Used  . Alcohol Use: No    Family History  Problem Relation Age of Onset  . Colon cancer Neg Hx   . Cancer Brother     spine/lung  . COPD Brother   . Rheum arthritis Brother   . Diabetes Mother   . Cancer Father     cirrohosis  . Heart disease Sister   . Arthritis Sister  rheumatoid  . Arthritis Sister     rheumatoid    No Known Allergies  Medication list has been reviewed and updated.  Current Outpatient Prescriptions on File Prior to Visit  Medication Sig Dispense Refill  . aspirin EC 325 MG EC tablet Take 1 tablet (325 mg total) by mouth daily.  30 tablet  0  . atorvastatin (LIPITOR) 40 MG tablet Take 40 mg by mouth daily.      . CHANTIX 0.5 MG tablet TAKE 1 TABLET BY MOUTH TWICE A DAY  60 tablet  1  . clonazePAM (KLONOPIN) 0.5 MG tablet TAKE 1 TABLET BY MOUTH TWICE DAILY AS NEEDED FOR ANXIETY  45 tablet  0  . clopidogrel (PLAVIX) 75 MG tablet Take 1 tablet (75 mg total) by mouth daily with breakfast.  30 tablet  11  . diphenhydramine-acetaminophen (TYLENOL PM) 25-500 MG TABS Take 4 tablets by mouth at bedtime as needed (for sleep).      Marland Kitchen levothyroxine (SYNTHROID) 100 MCG tablet Take 1 tablet (100 mcg  total) by mouth daily.  90 tablet  1  . tiotropium (SPIRIVA HANDIHALER) 18 MCG inhalation capsule Place 1 capsule (18 mcg total) into inhaler and inhale daily.  30 capsule  2   No current facility-administered medications on file prior to visit.    Review of Systems:  As per HPI- otherwise negative.   Physical Examination: Filed Vitals:   06/24/13 1447  BP: 132/74  Pulse: 78  Temp: 98.4 F (36.9 C)  Resp: 18   Filed Vitals:   06/24/13 1447  Height: 5\' 5"  (1.651 m)  Weight: 173 lb (78.472 kg)   Body mass index is 28.79 kg/(m^2). Ideal Body Weight: Weight in (lb) to have BMI = 25: 149.9  GEN: WDWN, NAD, Non-toxic, A & O x 3, looks well HEENT: Atraumatic, Normocephalic. Neck supple. No masses, No LAD. Ears and Nose: No external deformity. CV: RRR, No M/G/R. No JVD. No thrill. No extra heart sounds. PULM: CTA B, no wheezes, crackles, rhonchi. No retractions. No resp. distress. No accessory muscle use. ABD: S, NT, ND, +BS. No rebound. No HSM. EXTR: No c/c/e NEURO Normal gait.  PSYCH: Normally interactive. Conversant. Not depressed or anxious appearing.  Calm demeanor.  Lower extremities: normal strength, sensation and DTR bilaterally. No rash or lesion.  Normal ROM of feet, ankles, knees and hips.  Normal perfusion, normal filament testing left, missed 2 areas on the sole of the right foot  Results for orders placed in visit on 06/24/13  POCT GLYCOSYLATED HEMOGLOBIN (HGB A1C)      Result Value Range   Hemoglobin A1C 5.4     Assessment and Plan: Abnormal sensation of leg - Plan: POCT glycosylated hemoglobin (Hb A1C), Ankle brachial index  No diabetes mellitus.  Known to have vascular problems.  Will refer to vascular surgery for ABIs.  If negative suspect a neurological cause and can pursue neurology work- up.    Signed Abbe Amsterdam, MD

## 2013-06-24 NOTE — Patient Instructions (Signed)
I will get your set up for "ABI" testing.  If anything changes in the meantime please let me know

## 2013-06-26 ENCOUNTER — Other Ambulatory Visit: Payer: Self-pay | Admitting: Radiology

## 2013-06-26 DIAGNOSIS — M79604 Pain in right leg: Secondary | ICD-10-CM

## 2013-06-27 ENCOUNTER — Encounter (INDEPENDENT_AMBULATORY_CARE_PROVIDER_SITE_OTHER): Payer: BC Managed Care – PPO | Admitting: *Deleted

## 2013-06-27 DIAGNOSIS — R209 Unspecified disturbances of skin sensation: Secondary | ICD-10-CM

## 2013-07-03 ENCOUNTER — Telehealth: Payer: Self-pay

## 2013-07-03 DIAGNOSIS — R202 Paresthesia of skin: Secondary | ICD-10-CM

## 2013-07-03 NOTE — Telephone Encounter (Signed)
Patient says she is still having issues with her leg would like to have a referral to a neurologist if possible [phone]

## 2013-07-03 NOTE — Telephone Encounter (Signed)
Called her back. This is fine, will place referral.  Her ABIs looked fine.  The sensation in her leg is becoming more frequent

## 2013-07-14 ENCOUNTER — Encounter: Payer: Self-pay | Admitting: Neurology

## 2013-07-14 ENCOUNTER — Ambulatory Visit (INDEPENDENT_AMBULATORY_CARE_PROVIDER_SITE_OTHER): Payer: BC Managed Care – PPO | Admitting: Neurology

## 2013-07-14 VITALS — BP 130/85 | HR 66 | Ht 65.0 in | Wt 177.0 lb

## 2013-07-14 DIAGNOSIS — E039 Hypothyroidism, unspecified: Secondary | ICD-10-CM

## 2013-07-14 DIAGNOSIS — I771 Stricture of artery: Secondary | ICD-10-CM

## 2013-07-14 DIAGNOSIS — R202 Paresthesia of skin: Secondary | ICD-10-CM | POA: Insufficient documentation

## 2013-07-14 DIAGNOSIS — R209 Unspecified disturbances of skin sensation: Secondary | ICD-10-CM

## 2013-07-14 NOTE — Progress Notes (Signed)
[facility]  PATIENT: Bethany Clarke DOB: [birth year]  HISTORICAL  Dorea is a 57 year old right-handed Caucasian female, referred by her primary care physician for evaluation of intermittent right anterior, and right lateral leg paresthesia  She had past medical history of left subclavian artery stenosis, status post stent in Mar 14 2013, previous history of chickenpox  Over past 2 months, starting July 2014, she noticed intermittent paresthesia involving right anterior, lateral thigh, as if hot water pouring on it, lasting less than 1 minute, but multiple episodes in one hour   She denied weakness, there was no rash broke out, there was no low back pain, no left lower extremity involvement, no gait difficulty, no bowel bladder incontinence  Over the past 2 months, her symptoms had has been fairly stable  About 5 years ago she has one episode of rash broke out under her left armpit, was told that she had shingles, symptoms last about 6 months, gradually disappeared.  REVIEW OF SYSTEMS: Full 14 system review of systems performed and notable only for  weight gain, fatigue, shortness of breath, easy bruising, sleepiness, restless leg ALLERGIES: No Known Allergies  HOME MEDICATIONS: Outpatient Prescriptions Prior to Visit  Medication Sig Dispense Refill  . aspirin EC 325 MG EC tablet Take 1 tablet (325 mg total) by mouth daily.  30 tablet  0  . atorvastatin (LIPITOR) 40 MG tablet Take 40 mg by mouth daily.      . CHANTIX 0.5 MG tablet TAKE 1 TABLET BY MOUTH TWICE A DAY  60 tablet  1  . clonazePAM (KLONOPIN) 0.5 MG tablet TAKE 1 TABLET BY MOUTH TWICE DAILY AS NEEDED FOR ANXIETY  45 tablet  0  . clopidogrel (PLAVIX) 75 MG tablet Take 1 tablet (75 mg total) by mouth daily with breakfast.  30 tablet  11  . diphenhydramine-acetaminophen (TYLENOL PM) 25-500 MG TABS Take 4 tablets by mouth at bedtime as needed (for sleep).      Marland Kitchen levothyroxine (SYNTHROID) 100 MCG  tablet Take 1 tablet (100 mcg total) by mouth daily.  90 tablet  1  . tiotropium (SPIRIVA HANDIHALER) 18 MCG inhalation capsule Place 1 capsule (18 mcg total) into inhaler and inhale daily.  30 capsule  2   No facility-administered medications prior to visit.    PAST MEDICAL HISTORY: Past Medical History  Diagnosis Date  . GERD (gastroesophageal reflux disease)   . Hypothyroid   . Dizziness   . Syncope, near   . Heart burn   . H. pylori infection   . Atherosclerosis 11/14/2005    carotid US - mild calcific non-stenotic plaque bilaterally  . Carotid bruit 02/05/2009    Doppler - R/L ICAs 0-49% diameter reductin (velocities suggest low-mid range); L subclavian 0-49%  diameter reduction  . Lipidemia   . Emphysema of lung   . Pneumonia ~ 2009  . Chronic bronchitis     "growing up; not anymore" (03/14/2013)  . Subclavian artery stenosis, left     subclavian steal physiology status post PTA and stenting    PAST SURGICAL HISTORY: Past Surgical History  Procedure Laterality Date  . Abdominal hysterectomy  1999  . Subclavian artery stent Left 03/14/2013     for subclavian steal/claudication (03/14/2013)  . Dilation and curettage of uterus  1980's  . Tubal ligation  1980's  . Breast biopsy Right 1990's    "benign" (03/14/2013)  . Cardiac catheterization  03/03/2013    FAMILY HISTORY: Family History  Problem Relation Age of  Onset  . Colon cancer Neg Hx   . Cancer Brother     spine/lung  . COPD Brother   . Hypertension Brother   . Rheum arthritis Brother   . Diabetes Mother   . Heart disease Mother   . Stroke Mother   . Cancer Father     cirrohosis  . Hypertension Father   . Heart disease Sister   . Arthritis Sister     rheumatoid  . COPD Sister   . Arthritis Sister     rheumatoid  . Diabetes Sister     SOCIAL HISTORY:  History   Social History  . Marital Status: Married    Spouse Name: N/A    Number of Children: 3  . Years of Education: N/A   Occupational  History  . 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 Smoking status: Former Smoker -- 0.50 packs/day for 40 years    Types: Cigarettes    Quit date: 02/11/2013  . Smokeless tobacco: Never Used  . Alcohol Use: No  . Drug Use: No  . Sexual Activity: Yes     Comment: number of sex partners in the last 12 months  1   Other Topics Concern  . Not on file   Social History Narrative   Daily caffeine      PHYSICAL EXAM   Filed Vitals:   07/14/13 0839  BP: 130/85  Pulse: 66  Height: 5\' 5"  (1.651 m)  Weight: 177 lb (80.287 kg)     Body mass index is 29.45 kg/(m^2).   Generalized: In no acute distress  Neck: Supple, no carotid bruits   Cardiac: Regular rate rhythm  Pulmonary: Clear to auscultation bilaterally  Musculoskeletal: No deformity  Neurological examination  Mentation: Alert oriented to time, place, history taking, and causual conversation  Cranial nerve II-XII: Pupils were equal round reactive to light extraocular movements were full, visual field were full on confrontational test. facial sensation and strength were normal. hearing was intact to finger rubbing bilaterally. Uvula tongue midline.  head turning and shoulder shrug and were normal and symmetric.Tongue protrusion into cheek strength was normal.  Motor: normal tone, bulk and strength.  Sensory: Intact to fine touch, pinprick, preserved vibratory sensation, and proprioception at toes.  Coordination: Normal finger to nose, heel-to-shin bilaterally there was no truncal ataxia  Gait: Rising up from seated position without assistance, normal stance, without trunk ataxia, moderate stride, good arm swing, smooth turning, able to perform tiptoe, and heel walking without difficulty.   Romberg signs: Negative  Deep tendon reflexes: Brachioradialis 2/2, biceps 2/2, triceps 2/2, patellar 2/2, Achilles 2/2, plantar responses were flexor bilaterally.   DIAGNOSTIC DATA (LABS, IMAGING, TESTING) - I  reviewed patient records, labs, notes, testing and imaging myself where available.  Lab Results  Component Value Date   WBC 5.4 03/15/2013   HGB 12.4 03/15/2013   HCT 36.6 03/15/2013   MCV 99.5 03/15/2013   PLT 272 03/15/2013      Component Value Date/Time   NA 142 03/15/2013 0525   K 4.2 03/15/2013 0525   CL 103 03/15/2013 0525   CO2 30 03/15/2013 0525   GLUCOSE 88 03/15/2013 0525   BUN 10 03/15/2013 0525   CREATININE 0.75 03/15/2013 0525   CREATININE 0.71 03/11/2013 0725   CALCIUM 9.9 03/15/2013 0525   PROT 7.4 02/25/2013 1440   ALBUMIN 4.3 02/25/2013 1440   AST 24 02/25/2013 1440   ALT 52* 02/25/2013 1440   ALKPHOS 78  02/25/2013 1440   BILITOT 0.3 02/25/2013 1440   GFRNONAA >90 03/15/2013 0525   GFRAA >90 03/15/2013 0525   Lab Results  Component Value Date   CHOL 170 02/25/2013   HDL 53 02/25/2013   LDLCALC 87 02/25/2013   TRIG 150* 02/25/2013   CHOLHDL 3.2 02/25/2013   Lab Results  Component Value Date   HGBA1C 5.4 06/24/2013   No results found for this basename: VITAMINB12   Lab Results  Component Value Date   TSH 1.260 02/25/2013     ASSESSMENT AND PLAN  57 year old right-handed Caucasian female, with past medical history of chickenpox, now presenting with right anterior, lateral thigh intermittent paresthesia, consistent with neuropathic pain, suggestive of shingles, but there was no rash broke out   1, the pain was mild to moderate, intermittent, she does not want to go on any neuropathic pain medications. 2. We decided to continue to observe her symptoms, return to clinic if her symptoms worse    Levert Feinstein, M.D. Ph.D.  [facility] [address] [place], [place] [address] [phone]

## 2013-07-19 ENCOUNTER — Other Ambulatory Visit: Payer: Self-pay | Admitting: Family Medicine

## 2013-07-19 DIAGNOSIS — F411 Generalized anxiety disorder: Secondary | ICD-10-CM

## 2013-07-21 NOTE — Telephone Encounter (Signed)
Refilled. Plan to recheck before this runs out, as plan for recheck in 2 months at July office visit.

## 2013-08-01 ENCOUNTER — Other Ambulatory Visit: Payer: Self-pay | Admitting: Family Medicine

## 2013-08-20 ENCOUNTER — Ambulatory Visit: Payer: BC Managed Care – PPO

## 2013-08-20 ENCOUNTER — Ambulatory Visit (INDEPENDENT_AMBULATORY_CARE_PROVIDER_SITE_OTHER): Payer: BC Managed Care – PPO | Admitting: Emergency Medicine

## 2013-08-20 VITALS — BP 122/82 | HR 80 | Temp 98.1°F | Resp 16 | Ht 65.0 in | Wt 160.0 lb

## 2013-08-20 DIAGNOSIS — R059 Cough, unspecified: Secondary | ICD-10-CM

## 2013-08-20 DIAGNOSIS — R05 Cough: Secondary | ICD-10-CM

## 2013-08-20 DIAGNOSIS — J209 Acute bronchitis, unspecified: Secondary | ICD-10-CM

## 2013-08-20 LAB — POCT CBC
Granulocyte percent: 70.5 %G (ref 37–80)
MCV: 100.8 fL — AB (ref 80–97)
MID (cbc): 0.5 (ref 0–0.9)
MPV: 8.5 fL (ref 0–99.8)
POC Granulocyte: 7.8 — AB (ref 2–6.9)
POC LYMPH PERCENT: 24.7 %L (ref 10–50)
POC MID %: 4.8 %M (ref 0–12)
Platelet Count, POC: 279 10*3/uL (ref 142–424)
RBC: 4.37 M/uL (ref 4.04–5.48)

## 2013-08-20 MED ORDER — ONDANSETRON 8 MG PO TBDP: 8.0000 mg | ORAL_TABLET | Freq: Three times a day (TID) | ORAL | Status: DC | PRN

## 2013-08-20 MED ORDER — HYDROCOD POLST-CHLORPHEN POLST 10-8 MG/5ML PO LQCR: 5.0000 mL | Freq: Two times a day (BID) | ORAL | Status: DC | PRN

## 2013-08-20 MED ORDER — AZITHROMYCIN 250 MG PO TABS: ORAL_TABLET | ORAL | Status: DC

## 2013-08-20 NOTE — Patient Instructions (Signed)

## 2013-08-20 NOTE — Progress Notes (Signed)
[facility] [address] [place] [address] [phone]  Date:  08/20/2013   Name:  Bethany Clarke   DOB:  1956-02-05   MRN:  [id number]  PCP:  [name], MD    Chief Complaint: Cough, Sore Throat and Emesis   History of Present Illness:  Bethany Clarke is a 57 y.o. very pleasant female patient who presents with the following:  Ill since yesterday and told by a "minute clinic" that she has "pneumonia".  Started yesterday morning with a sore throat.  She developed a headache yesterday afternoon.  She said the headache is similar to only one that she has experienced in the past.  Is located in the forehead.  Not associated with neuro of visual symptoms.  Has some photophobia.  Started coughing yesterday and has no sputum production,  Wheezing or shortness of breath.  No nausea or vomiting.  No stool change or rash.  Poor po intake.  No improvement with over the counter medications or other home remedies. Denies other complaint or health concern today.   Patient Active Problem List   Diagnosis Date Noted  . Paresthesia 07/14/2013  . Bilateral foot pain 05/01/2013  . Subclavian artery stenosis, left   . Subclavian steal syndrome 03/16/2013  . Subclavian arterial stenosis 03/11/2013  . Carotid stenosis 03/11/2013  . Hyperlipidemia LDL goal < 70 03/11/2013  . GERD (gastroesophageal reflux disease)   . Hypothyroid   . Dizziness   . Syncope, near   . Heart burn   . HYPOTHYROIDISM 05/28/2007  . ASTHMA 05/28/2007  . COPD 05/28/2007    Past Medical History  Diagnosis Date  . GERD (gastroesophageal reflux disease)   . Hypothyroid   . Dizziness   . Syncope, near   . Heart burn   . H. pylori infection   . Atherosclerosis 11/14/2005    carotid US - mild calcific non-stenotic plaque bilaterally  . Carotid bruit 02/05/2009    Doppler - R/L ICAs 0-49% diameter reductin (velocities suggest low-mid range); L subclavian 0-49%  diameter reduction  .  Lipidemia   . Emphysema of lung   . Pneumonia ~ 2009  . Chronic bronchitis     "growing up; not anymore" (03/14/2013)  . Subclavian artery stenosis, left     subclavian steal physiology status post PTA and stenting    Past Surgical History  Procedure Laterality Date  . Abdominal hysterectomy  1999  . Subclavian artery stent Left 03/14/2013     for subclavian steal/claudication (03/14/2013)  . Dilation and curettage of uterus  1980's  . Tubal ligation  1980's  . Breast biopsy Right 1990's    "benign" (03/14/2013)  . Cardiac catheterization  03/03/2013    History  Substance Use Topics  . Smoking status: Former Smoker -- 0.50 packs/day for 40 years    Types: Cigarettes    Quit date: 02/11/2013  . Smokeless tobacco: Never Used  . Alcohol Use: No    Family History  Problem Relation Age of Onset  . Colon cancer Neg Hx   . Cancer Brother     spine/lung  . COPD Brother   . Hypertension Brother   . Rheum arthritis Brother   . Diabetes Mother   . Heart disease Mother   . Stroke Mother   . Cancer Father     cirrohosis  . Hypertension Father   . Heart disease Sister   . Arthritis Sister     rheumatoid  . COPD  Sister   . Arthritis Sister     rheumatoid  . Diabetes Sister     No Known Allergies  Medication list has been reviewed and updated.  Current Outpatient Prescriptions on File Prior to Visit  Medication Sig Dispense Refill  . aspirin EC 325 MG EC tablet Take 1 tablet (325 mg total) by mouth daily.  30 tablet  0  . atorvastatin (LIPITOR) 40 MG tablet Take 40 mg by mouth daily.      . clonazePAM (KLONOPIN) 0.5 MG tablet TAKE 1 TABLET BY MOUTH TWICE A DAY AS NEEDED FOR ANXIETY  45 tablet  0  . clopidogrel (PLAVIX) 75 MG tablet Take 1 tablet (75 mg total) by mouth daily with breakfast.  30 tablet  11  . diphenhydramine-acetaminophen (TYLENOL PM) 25-500 MG TABS Take 4 tablets by mouth at bedtime as needed (for sleep).      Marland Kitchen levothyroxine (SYNTHROID) 100 MCG tablet Take  1 tablet (100 mcg total) by mouth daily before breakfast. OVERDUE FOR FOLLOW UP APPT  30 tablet  0  . CHANTIX 0.5 MG tablet TAKE 1 TABLET BY MOUTH TWICE A DAY  60 tablet  1   No current facility-administered medications on file prior to visit.    Review of Systems:   As per HPI, otherwise negative.     Physical Examination: Filed Vitals:   08/20/13 1324  BP: 122/82  Pulse: 80  Temp: 98.1 F (36.7 C)  Resp: 16   Filed Vitals:   08/20/13 1324  Height: 5\' 5"  (1.651 m)  Weight: 160 lb (72.576 kg)   Body mass index is 26.63 kg/(m^2). Ideal Body Weight: Weight in (lb) to have BMI = 25: 149.9  GEN: WDWN, NAD, Non-toxic, A & O x 3  No rash or shortness of breath.  Well hydrated. HEENT: Atraumatic, Normocephalic. Neck supple. No masses, No LAD. Ears and Nose: No external deformity. CV: RRR, No M/G/R. No JVD. No thrill. No extra heart sounds. PULM: CTA B, no wheezes, crackles, rhonchi. No retractions. No resp. distress. No accessory muscle use. ABD: S, NT, ND, +BS. No rebound. No HSM. EXTR: No c/c/e NEURO Normal gait.  PSYCH: Normally interactive. Conversant. Tearful and anxious appearing.  Calm demeanor.   Chaperone;  Doctor, general practice and Plan: Bronchitis zpak  tussionex  Signed,  Phillips Odor, MD  [facility] reading (PRIMARY) by  Dr. Dareen Piano.  Minimal atelectasis LLL.    Results for orders placed in visit on 08/20/13  POCT CBC      Result Value Range   WBC 11.1 (*) 4.6 - 10.2 K/uL   Lymph, poc 2.7  0.6 - 3.4   POC LYMPH PERCENT 24.7  10 - 50 %L   MID (cbc) 0.5  0 - 0.9   POC MID % 4.8  0 - 12 %M   POC Granulocyte 7.8 (*) 2 - 6.9   Granulocyte percent 70.5  37 - 80 %G   RBC 4.37  4.04 - 5.48 M/uL   Hemoglobin 13.9  12.2 - 16.2 g/dL   HCT, POC 16.1  09.6 - 47.9 %   MCV 100.8 (*) 80 - 97 fL   MCH, POC 31.8 (*) 27 - 31.2 pg   MCHC 3.6 (*) 31.8 - 35.4 g/dL   RDW, POC 04.5     Platelet Count, POC 279  142 - 424 K/uL   MPV 8.5  0 - 99.8 fL

## 2013-08-21 ENCOUNTER — Telehealth: Payer: Self-pay

## 2013-08-21 ENCOUNTER — Other Ambulatory Visit: Payer: Self-pay

## 2013-08-21 NOTE — Telephone Encounter (Signed)
Please advise. Dr Dareen Piano mentions h/a in his notes, but does not indicate what plan is? Will you look behind me? Can advise to RTC, if needed.

## 2013-08-21 NOTE — Telephone Encounter (Signed)
Pt was seen yesterday and her head is still killing her and she would like something called in for it Call back number is [phone]

## 2013-08-22 NOTE — Telephone Encounter (Signed)
Please call this patient and get an update on her HA.  If it persists, please discuss with Dr. Dareen Piano, who saw her, and is here today.

## 2013-08-22 NOTE — Telephone Encounter (Signed)
Discussed with Dr Dareen Piano. Will advise patient when she calls back.

## 2013-08-22 NOTE — Telephone Encounter (Signed)
Thanks, I have called her. Left message for her to call me back.

## 2013-08-25 NOTE — Telephone Encounter (Signed)
Patient has not returned my calls

## 2013-09-05 ENCOUNTER — Emergency Department (HOSPITAL_COMMUNITY): Payer: BC Managed Care – PPO

## 2013-09-05 ENCOUNTER — Emergency Department (HOSPITAL_COMMUNITY)
Admission: EM | Admit: 2013-09-05 | Discharge: 2013-09-05 | Disposition: A | Discharge status: Home / Self Care | Payer: BC Managed Care – PPO | Attending: Emergency Medicine | Admitting: Emergency Medicine

## 2013-09-05 ENCOUNTER — Encounter (HOSPITAL_COMMUNITY): Payer: Self-pay | Admitting: Emergency Medicine

## 2013-09-05 DIAGNOSIS — E039 Hypothyroidism, unspecified: Secondary | ICD-10-CM | POA: Insufficient documentation

## 2013-09-05 DIAGNOSIS — E785 Hyperlipidemia, unspecified: Secondary | ICD-10-CM | POA: Insufficient documentation

## 2013-09-05 DIAGNOSIS — Y939 Activity, unspecified: Secondary | ICD-10-CM | POA: Insufficient documentation

## 2013-09-05 DIAGNOSIS — Y929 Unspecified place or not applicable: Secondary | ICD-10-CM | POA: Insufficient documentation

## 2013-09-05 DIAGNOSIS — M25572 Pain in left ankle and joints of left foot: Secondary | ICD-10-CM

## 2013-09-05 DIAGNOSIS — Z8701 Personal history of pneumonia (recurrent): Secondary | ICD-10-CM | POA: Insufficient documentation

## 2013-09-05 DIAGNOSIS — Z8719 Personal history of other diseases of the digestive system: Secondary | ICD-10-CM | POA: Insufficient documentation

## 2013-09-05 DIAGNOSIS — R296 Repeated falls: Secondary | ICD-10-CM | POA: Insufficient documentation

## 2013-09-05 DIAGNOSIS — Z95818 Presence of other cardiac implants and grafts: Secondary | ICD-10-CM | POA: Insufficient documentation

## 2013-09-05 DIAGNOSIS — Z7902 Long term (current) use of antithrombotics/antiplatelets: Secondary | ICD-10-CM | POA: Insufficient documentation

## 2013-09-05 DIAGNOSIS — X500XXA Overexertion from strenuous movement or load, initial encounter: Secondary | ICD-10-CM | POA: Insufficient documentation

## 2013-09-05 DIAGNOSIS — Z8709 Personal history of other diseases of the respiratory system: Secondary | ICD-10-CM | POA: Insufficient documentation

## 2013-09-05 DIAGNOSIS — M79672 Pain in left foot: Secondary | ICD-10-CM

## 2013-09-05 DIAGNOSIS — Z7982 Long term (current) use of aspirin: Secondary | ICD-10-CM | POA: Insufficient documentation

## 2013-09-05 DIAGNOSIS — Z8619 Personal history of other infectious and parasitic diseases: Secondary | ICD-10-CM | POA: Insufficient documentation

## 2013-09-05 DIAGNOSIS — S8990XA Unspecified injury of unspecified lower leg, initial encounter: Secondary | ICD-10-CM | POA: Insufficient documentation

## 2013-09-05 DIAGNOSIS — Z8679 Personal history of other diseases of the circulatory system: Secondary | ICD-10-CM | POA: Insufficient documentation

## 2013-09-05 DIAGNOSIS — Z79899 Other long term (current) drug therapy: Secondary | ICD-10-CM | POA: Insufficient documentation

## 2013-09-05 DIAGNOSIS — Z87891 Personal history of nicotine dependence: Secondary | ICD-10-CM | POA: Insufficient documentation

## 2013-09-05 MED ORDER — OXYCODONE-ACETAMINOPHEN 5-325 MG PO TABS
1.0000 | ORAL_TABLET | Freq: Once | ORAL | Status: AC
  Administered 2013-09-05: 1 via ORAL
  Filled 2013-09-05: qty 1

## 2013-09-05 MED ORDER — HYDROCODONE-ACETAMINOPHEN 5-325 MG PO TABS: 1.0000 | ORAL_TABLET | ORAL | Status: DC | PRN

## 2013-09-05 MED ORDER — IBUPROFEN 800 MG PO TABS: 800.0000 mg | ORAL_TABLET | Freq: Three times a day (TID) | ORAL | Status: DC | PRN

## 2013-09-05 NOTE — Progress Notes (Signed)
Orthopedic Tech Progress Note Patient Details:  Bethany Clarke [birth year] [id number]  Ortho Devices Type of Ortho Device: ASO;Crutches Ortho Device/Splint Interventions: Application   Shawnie Pons 09/05/2013, 2:46 PM

## 2013-09-05 NOTE — ED Provider Notes (Signed)
CSN: [id number]     Arrival date & time 09/05/13  1308 History  This chart was scribed for non-physician practitioner, Trixie Dredge, PA-C, working with Laray Anger, DO by Shari Heritage, ED Scribe. This patient was seen in room TR08C/TR08C and the patient's care was started at 2:22 PM.     Chief Complaint  Patient presents with  . Ankle Pain    The history is provided by the patient. No language interpreter was used.   HPI Comments: Bethany Clarke is a 57 y.o. female who presents to the Emergency Department complaining of constant left ankle and foot pain that began this morning. Currently, she rates pain as 9/10. Patient states that she stepped down on a piece of wood that she thought was stable and twisted her ankle. She denies numbness or weakness in the left lower extremity. She did not hit her head and denies loss of consciousness. She is reporting no other injuries at this time.   Past Medical History  Diagnosis Date  . GERD (gastroesophageal reflux disease)   . Hypothyroid   . Dizziness   . Syncope, near   . Heart burn   . H. pylori infection   . Atherosclerosis 11/14/2005    carotid US - mild calcific non-stenotic plaque bilaterally  . Carotid bruit 02/05/2009    Doppler - R/L ICAs 0-49% diameter reductin (velocities suggest low-mid range); L subclavian 0-49%  diameter reduction  . Lipidemia   . Emphysema of lung   . Pneumonia ~ 2009  . Chronic bronchitis     "growing up; not anymore" (03/14/2013)  . Subclavian artery stenosis, left     subclavian steal physiology status post PTA and stenting   Past Surgical History  Procedure Laterality Date  . Abdominal hysterectomy  1999  . Subclavian artery stent Left 03/14/2013     for subclavian steal/claudication (03/14/2013)  . Dilation and curettage of uterus  1980's  . Tubal ligation  1980's  . Breast biopsy Right 1990's    "benign" (03/14/2013)  . Cardiac catheterization  03/03/2013   Family History  Problem Relation  Age of Onset  . Colon cancer Neg Hx   . Cancer Brother     spine/lung  . COPD Brother   . Hypertension Brother   . Rheum arthritis Brother   . Diabetes Mother   . Heart disease Mother   . Stroke Mother   . Cancer Father     cirrohosis  . Hypertension Father   . Heart disease Sister   . Arthritis Sister     rheumatoid  . COPD Sister   . Arthritis Sister     rheumatoid  . Diabetes Sister    History  Substance Use Topics  . Smoking status: Former Smoker -- 0.50 packs/day for 40 years    Types: Cigarettes    Quit date: 02/11/2013  . Smokeless tobacco: Never Used  . Alcohol Use: No   OB History   Grav Para Term Preterm Abortions TAB SAB Ect Mult Living                 Review of Systems  Musculoskeletal: Positive for arthralgias.  Skin: Negative for color change and wound.  Neurological: Negative for syncope, weakness, numbness and headaches.    Allergies  Review of patient's allergies indicates no known allergies.  Home Medications   Current Outpatient Rx  Name  Route  Sig  Dispense  Refill  . aspirin EC 325 MG EC tablet  Oral   Take 1 tablet (325 mg total) by mouth daily.   30 tablet   0   . atorvastatin (LIPITOR) 40 MG tablet   Oral   Take 40 mg by mouth daily.         . clonazePAM (KLONOPIN) 0.5 MG tablet      TAKE 1 TABLET BY MOUTH TWICE A DAY AS NEEDED FOR ANXIETY   45 tablet   0   . clopidogrel (PLAVIX) 75 MG tablet   Oral   Take 1 tablet (75 mg total) by mouth daily with breakfast.   30 tablet   11   . diphenhydramine-acetaminophen (TYLENOL PM) 25-500 MG TABS   Oral   Take 4 tablets by mouth at bedtime as needed (for sleep).         Marland Kitchen levothyroxine (SYNTHROID) 100 MCG tablet   Oral   Take 1 tablet (100 mcg total) by mouth daily before breakfast. OVERDUE FOR FOLLOW UP APPT   30 tablet   0   . ondansetron (ZOFRAN-ODT) 8 MG disintegrating tablet   Oral   Take 1 tablet (8 mg total) by mouth every 8 (eight) hours as needed for  nausea.   30 tablet   0    Triage Vitals: BP 156/73  Pulse 90  Temp(Src) 97.5 F (36.4 C) (Oral)  Resp 18  Ht 5\' 5"  (1.651 m)  Wt 160 lb (72.576 kg)  BMI 26.63 kg/m2  SpO2 97% Physical Exam  Nursing note and vitals reviewed. Constitutional: She appears well-developed and well-nourished. No distress.  HENT:  Head: Normocephalic and atraumatic.  Neck: Neck supple.  Pulmonary/Chest: Effort normal.  Musculoskeletal:  Diffuse tenderness throughout left ankle and left foot without swelling. Left ankle is stable, no laxity. Capillary refill less than 3 seconds. Able to move all digits of left foot. DP and PT pulses intact. Sensation intact. Distal pulses intact. Left knee is stable, no laxity, nontender. Left lower leg without calf tenderness or bony tenderness.  Neurological: She is alert.  Skin: She is not diaphoretic.    ED Course  Procedures (including critical care time) DIAGNOSTIC STUDIES: Oxygen Saturation is 97% on room air, adequate by my interpretation.    COORDINATION OF CARE: 2:26 PM- Patient informed of current plan for treatment and evaluation and agrees with plan at this time.    Imaging Review Dg Ankle Complete Left  09/05/2013   CLINICAL DATA:  Patient rolled left ankle.  Pain.  EXAM: LEFT ANKLE COMPLETE - 3+ VIEW  COMPARISON:  None.  FINDINGS: There is no evidence of fracture, dislocation, or joint effusion. There is no evidence of arthropathy or other focal bone abnormality. Soft tissues are unremarkable.  IMPRESSION: Negative.   Electronically Signed   By: Maisie Fus  Register   On: 09/05/2013 14:07   Dg Foot Complete Left  09/05/2013   CLINICAL DATA:  History trauma from a fall complaining of foot ankle pain.  EXAM: LEFT FOOT - COMPLETE 3+ VIEW  COMPARISON:  None.  FINDINGS: Multiple views of the left foot demonstrate no acute displaced fracture, subluxation, dislocation, or soft tissue abnormality.  IMPRESSION: No acute radiographic abnormality of the left foot.    Electronically Signed   By: Trudie Reed M.D.   On: 09/05/2013 14:06     MDM   1. Left ankle pain   2. Left foot pain    Patient with inversion injury to left ankle with diffuse pain and tenderness throughout ankle and foot.  Neurovascularly intact.  Pt placed in ASO and given crutches.  D/C home with norco and motrin.  Discussed results, findings, treatment, and follow up  with patient.  Pt given return precautions.  Pt verbalizes understanding and agrees with plan.      I personally performed the services described in this documentation, which was scribed in my presence. The recorded information has been reviewed and is accurate.    Trixie Dredge, PA-C 09/05/13 1523

## 2013-09-05 NOTE — ED Notes (Signed)
Per pt sts fell this am and injured left ankle/foot.

## 2013-09-06 NOTE — ED Provider Notes (Signed)
Medical screening examination/treatment/procedure(s) were performed by non-physician practitioner and as supervising physician I was immediately available for consultation/collaboration.  EKG Interpretation   None         Laray Anger, DO 09/06/13 1045

## 2013-09-07 ENCOUNTER — Encounter (HOSPITAL_COMMUNITY): Payer: Self-pay | Admitting: Emergency Medicine

## 2013-09-07 ENCOUNTER — Emergency Department (HOSPITAL_COMMUNITY)
Admission: EM | Admit: 2013-09-07 | Discharge: 2013-09-07 | Disposition: A | Discharge status: Home / Self Care | Payer: BC Managed Care – PPO | Attending: Emergency Medicine | Admitting: Emergency Medicine

## 2013-09-07 DIAGNOSIS — Z8719 Personal history of other diseases of the digestive system: Secondary | ICD-10-CM | POA: Insufficient documentation

## 2013-09-07 DIAGNOSIS — Z95818 Presence of other cardiac implants and grafts: Secondary | ICD-10-CM | POA: Insufficient documentation

## 2013-09-07 DIAGNOSIS — E039 Hypothyroidism, unspecified: Secondary | ICD-10-CM | POA: Insufficient documentation

## 2013-09-07 DIAGNOSIS — R1110 Vomiting, unspecified: Secondary | ICD-10-CM

## 2013-09-07 DIAGNOSIS — E860 Dehydration: Secondary | ICD-10-CM

## 2013-09-07 DIAGNOSIS — Z8679 Personal history of other diseases of the circulatory system: Secondary | ICD-10-CM | POA: Insufficient documentation

## 2013-09-07 DIAGNOSIS — Z8619 Personal history of other infectious and parasitic diseases: Secondary | ICD-10-CM | POA: Insufficient documentation

## 2013-09-07 DIAGNOSIS — Z79899 Other long term (current) drug therapy: Secondary | ICD-10-CM | POA: Insufficient documentation

## 2013-09-07 DIAGNOSIS — Z8701 Personal history of pneumonia (recurrent): Secondary | ICD-10-CM | POA: Insufficient documentation

## 2013-09-07 DIAGNOSIS — Z7982 Long term (current) use of aspirin: Secondary | ICD-10-CM | POA: Insufficient documentation

## 2013-09-07 DIAGNOSIS — Z87891 Personal history of nicotine dependence: Secondary | ICD-10-CM | POA: Insufficient documentation

## 2013-09-07 DIAGNOSIS — Z7902 Long term (current) use of antithrombotics/antiplatelets: Secondary | ICD-10-CM | POA: Insufficient documentation

## 2013-09-07 DIAGNOSIS — J438 Other emphysema: Secondary | ICD-10-CM | POA: Insufficient documentation

## 2013-09-07 LAB — COMPREHENSIVE METABOLIC PANEL
ALT: 35 U/L (ref 0–35)
AST: 20 U/L (ref 0–37)
Albumin: 4.3 g/dL (ref 3.5–5.2)
Alkaline Phosphatase: 90 U/L (ref 39–117)
CO2: 23 mEq/L (ref 19–32)
Calcium: 9.8 mg/dL (ref 8.4–10.5)
Creatinine, Ser: 0.57 mg/dL (ref 0.50–1.10)
GFR calc non Af Amer: >90 mL/min (ref >90)
Potassium: 3.9 mEq/L (ref 3.5–5.1)
Sodium: 137 mEq/L (ref 135–145)
Total Protein: 7.8 g/dL (ref 6.0–8.3)

## 2013-09-07 LAB — URINALYSIS, ROUTINE W REFLEX MICROSCOPIC
Glucose, UA: NEGATIVE mg/dL
Hgb urine dipstick: NEGATIVE
Specific Gravity, Urine: 1.023 (ref 1.005–1.030)

## 2013-09-07 LAB — CBC WITH DIFFERENTIAL/PLATELET
Basophils Absolute: 0 10*3/uL (ref 0.0–0.1)
Basophils Relative: 0 % (ref 0–1)
Eosinophils Absolute: 0 10*3/uL (ref 0.0–0.7)
Eosinophils Relative: 0 % (ref 0–5)
Lymphocytes Relative: 33 % (ref 12–46)
Lymphs Abs: 2.3 10*3/uL (ref 0.7–4.0)
MCH: 33 pg (ref 26.0–34.0)
MCHC: 34.1 g/dL (ref 30.0–36.0)
MCV: 96.8 fL (ref 78.0–100.0)
Platelets: 257 10*3/uL (ref 150–400)
RDW: 12.8 % (ref 11.5–15.5)
WBC: 7 10*3/uL (ref 4.0–10.5)

## 2013-09-07 LAB — URINE MICROSCOPIC-ADD ON

## 2013-09-07 MED ORDER — SODIUM CHLORIDE 0.9 % IV BOLUS (SEPSIS)
1000.0000 mL | Freq: Once | INTRAVENOUS | Status: AC
  Administered 2013-09-07: 1000 mL via INTRAVENOUS

## 2013-09-07 MED ORDER — METOCLOPRAMIDE HCL 10 MG PO TABS: 10.0000 mg | ORAL_TABLET | Freq: Four times a day (QID) | ORAL | Status: DC | PRN

## 2013-09-07 MED ORDER — METOCLOPRAMIDE HCL 5 MG/ML IJ SOLN
10.0000 mg | Freq: Once | INTRAMUSCULAR | Status: AC
  Administered 2013-09-07: 10 mg via INTRAVENOUS
  Filled 2013-09-07: qty 2

## 2013-09-07 MED ORDER — DIPHENHYDRAMINE HCL 50 MG/ML IJ SOLN
25.0000 mg | Freq: Once | INTRAMUSCULAR | Status: AC
  Administered 2013-09-07: 25 mg via INTRAVENOUS
  Filled 2013-09-07: qty 1

## 2013-09-07 MED ORDER — ONDANSETRON HCL 4 MG/2ML IJ SOLN
4.0000 mg | Freq: Once | INTRAMUSCULAR | Status: AC
  Administered 2013-09-07: 4 mg via INTRAVENOUS
  Filled 2013-09-07: qty 2

## 2013-09-07 NOTE — ED Notes (Signed)
Per pt sts vomiting since Friday. Denies abdominal pain. sts she was taking pain meds and stopped because she thought it was the medication that was making her sick but it continued.

## 2013-09-07 NOTE — ED Provider Notes (Signed)
CSN: [id number]     Arrival date & time 09/07/13  1251 History   First MD Initiated Contact with Patient 09/07/13 1304     Chief Complaint  Patient presents with  . Emesis   (Consider location/radiation/quality/duration/timing/severity/associated sxs/prior Treatment) The history is provided by the patient.  Bethany Clarke is a 57 y.o. female history of reflux, H. pylori here presenting with vomiting. Vomiting for the last 2 days. She was prescribed some Vicodin for her ankle sprain. She said taking I can make her father more so she stopped it but she still has some vomiting. Vomiting bilious and nonbloody material. She denies any diarrhea but feels constipated. She is passing gas however and denies any fevers return or symptoms. Denies any abdominal pain.    Past Medical History  Diagnosis Date  . GERD (gastroesophageal reflux disease)   . Hypothyroid   . Dizziness   . Syncope, near   . Heart burn   . H. pylori infection   . Atherosclerosis 11/14/2005    carotid US - mild calcific non-stenotic plaque bilaterally  . Carotid bruit 02/05/2009    Doppler - R/L ICAs 0-49% diameter reductin (velocities suggest low-mid range); L subclavian 0-49%  diameter reduction  . Lipidemia   . Emphysema of lung   . Pneumonia ~ 2009  . Chronic bronchitis     "growing up; not anymore" (03/14/2013)  . Subclavian artery stenosis, left     subclavian steal physiology status post PTA and stenting   Past Surgical History  Procedure Laterality Date  . Abdominal hysterectomy  1999  . Subclavian artery stent Left 03/14/2013     for subclavian steal/claudication (03/14/2013)  . Dilation and curettage of uterus  1980's  . Tubal ligation  1980's  . Breast biopsy Right 1990's    "benign" (03/14/2013)  . Cardiac catheterization  03/03/2013   Family History  Problem Relation Age of Onset  . Colon cancer Neg Hx   . Cancer Brother     spine/lung  . COPD Brother   . Hypertension Brother   . Rheum arthritis  Brother   . Diabetes Mother   . Heart disease Mother   . Stroke Mother   . Cancer Father     cirrohosis  . Hypertension Father   . Heart disease Sister   . Arthritis Sister     rheumatoid  . COPD Sister   . Arthritis Sister     rheumatoid  . Diabetes Sister    History  Substance Use Topics  . Smoking status: Former Smoker -- 0.50 packs/day for 40 years    Types: Cigarettes    Quit date: 02/11/2013  . Smokeless tobacco: Never Used  . Alcohol Use: No   OB History   Grav Para Term Preterm Abortions TAB SAB Ect Mult Living                 Review of Systems  Gastrointestinal: Positive for vomiting.  All other systems reviewed and are negative.    Allergies  Review of patient's allergies indicates no known allergies.  Home Medications   Current Outpatient Rx  Name  Route  Sig  Dispense  Refill  . aspirin EC 325 MG EC tablet   Oral   Take 1 tablet (325 mg total) by mouth daily.   30 tablet   0   . atorvastatin (LIPITOR) 40 MG tablet   Oral   Take 40 mg by mouth daily.         Marland Kitchen  clonazePAM (KLONOPIN) 0.5 MG tablet   Oral   Take 0.5 mg by mouth 2 (two) times daily as needed for anxiety.         . clopidogrel (PLAVIX) 75 MG tablet   Oral   Take 1 tablet (75 mg total) by mouth daily with breakfast.   30 tablet   11   . diphenhydramine-acetaminophen (TYLENOL PM) 25-500 MG TABS   Oral   Take 4 tablets by mouth at bedtime as needed (for sleep).         Marland Kitchen HYDROcodone-acetaminophen (NORCO/VICODIN) 5-325 MG per tablet   Oral   Take 1 tablet by mouth every 4 (four) hours as needed.   15 tablet   0   . ibuprofen (ADVIL,MOTRIN) 800 MG tablet   Oral   Take 1 tablet (800 mg total) by mouth every 8 (eight) hours as needed.   21 tablet   0   . levothyroxine (SYNTHROID) 100 MCG tablet   Oral   Take 1 tablet (100 mcg total) by mouth daily before breakfast. OVERDUE FOR FOLLOW UP APPT   30 tablet   0    BP 152/80  Pulse 88  Temp(Src) 98.7 F (37.1 C)  (Oral)  Resp 18  SpO2 98% Physical Exam  Nursing note and vitals reviewed. Constitutional: She is oriented to person, place, and time.  Uncomfortable   HENT:  Head: Normocephalic.  MM slightly dry   Eyes: Conjunctivae are normal. Pupils are equal, round, and reactive to light.  Neck: Normal range of motion. Neck supple.  Cardiovascular: Normal rate, regular rhythm and normal heart sounds.   Pulmonary/Chest: Effort normal and breath sounds normal. No respiratory distress. She has no wheezes. She has no rales.  Abdominal: Soft. Bowel sounds are normal. She exhibits no distension. There is no tenderness. There is no rebound and no guarding.  Musculoskeletal: Normal range of motion.  Neurological: She is alert and oriented to person, place, and time.  Skin: Skin is warm and dry.  Psychiatric: She has a normal mood and affect. Her behavior is normal. Judgment and thought content normal.    ED Course  Procedures (including critical care time) Labs Review Labs Reviewed  COMPREHENSIVE METABOLIC PANEL - Abnormal; Notable for the following:    Glucose, Bld 107 (*)    All other components within normal limits  URINALYSIS, ROUTINE W REFLEX MICROSCOPIC - Abnormal; Notable for the following:    Bilirubin Urine SMALL (*)    Ketones, ur 40 (*)    Protein, ur 30 (*)    Leukocytes, UA SMALL (*)    All other components within normal limits  URINE MICROSCOPIC-ADD ON - Abnormal; Notable for the following:    Squamous Epithelial / LPF FEW (*)    Casts HYALINE CASTS (*)    All other components within normal limits  CBC WITH DIFFERENTIAL  LIPASE, BLOOD   Imaging Review No results found.  EKG Interpretation   None       MDM  No diagnosis found. Bethany Clarke is a 57 y.o. female here with vomiting, dehydration. Likely viral gastro vs gastritis from pain meds. Will check labs, UA, hydrate patient and reassess.   4:09 PM Labs and UA unremarkable. Abdomen remained soft and nontender.  Tolerated PO in the ED. Will d/c home on reglan.     Richardean Canal, MD 09/07/13 [phone]

## 2013-09-08 ENCOUNTER — Other Ambulatory Visit: Payer: Self-pay | Admitting: Family Medicine

## 2013-09-09 ENCOUNTER — Other Ambulatory Visit (HOSPITAL_COMMUNITY): Payer: Self-pay | Admitting: Cardiovascular Disease

## 2013-09-09 DIAGNOSIS — R079 Chest pain, unspecified: Secondary | ICD-10-CM

## 2013-09-13 ENCOUNTER — Other Ambulatory Visit: Payer: Self-pay | Admitting: Family Medicine

## 2013-09-13 DIAGNOSIS — F411 Generalized anxiety disorder: Secondary | ICD-10-CM

## 2013-09-15 NOTE — Telephone Encounter (Signed)
Will refill, but ov needed prior to next refill as plan on recheck to discuss this medicine at 2 months from July OV. Let me know if there are questions.

## 2013-09-25 ENCOUNTER — Ambulatory Visit (HOSPITAL_COMMUNITY)
Admit: 2013-09-25 | Discharge: 2013-09-25 | Disposition: A | Discharge status: Home / Self Care | Payer: BC Managed Care – PPO | Attending: Internal Medicine | Admitting: Internal Medicine

## 2013-09-25 DIAGNOSIS — I6529 Occlusion and stenosis of unspecified carotid artery: Secondary | ICD-10-CM

## 2013-09-25 DIAGNOSIS — I739 Peripheral vascular disease, unspecified: Secondary | ICD-10-CM

## 2013-09-25 NOTE — Progress Notes (Signed)
Carotid Duplex Completed. Monti Jilek, BS, RDMS, RVT  

## 2013-09-29 ENCOUNTER — Encounter: Payer: Self-pay | Admitting: *Deleted

## 2013-09-29 ENCOUNTER — Telehealth: Payer: Self-pay | Admitting: *Deleted

## 2013-09-29 DIAGNOSIS — I779 Disorder of arteries and arterioles, unspecified: Secondary | ICD-10-CM

## 2013-09-29 NOTE — Telephone Encounter (Signed)
Order placed for repeat carotid dopplers in 1 year  

## 2013-09-29 NOTE — Telephone Encounter (Signed)
Message copied by Marella Bile on Mon Sep 29, 2013  1:46 PM ------      Message from: [name]      Created: Mon Sep 29, 2013  8:32 AM       No change from prior study. Repeat in 12 months. ------

## 2013-10-01 ENCOUNTER — Ambulatory Visit (HOSPITAL_COMMUNITY)
Admit: 2013-10-01 | Discharge: 2013-10-01 | Disposition: A | Discharge status: Home / Self Care | Payer: BC Managed Care – PPO | Attending: Cardiovascular Disease | Admitting: Cardiovascular Disease

## 2013-10-01 DIAGNOSIS — I739 Peripheral vascular disease, unspecified: Secondary | ICD-10-CM

## 2013-10-01 NOTE — Progress Notes (Signed)
Upper Extremity Arterial Duplex Completed. °Brianna L Mazza,RVT °

## 2013-10-02 ENCOUNTER — Encounter: Payer: Self-pay | Admitting: *Deleted

## 2013-10-10 ENCOUNTER — Other Ambulatory Visit: Payer: Self-pay | Admitting: Physician Assistant

## 2013-10-10 MED ORDER — LEVOTHYROXINE SODIUM 100 MCG PO TABS: 100.0000 ug | ORAL_TABLET | Freq: Every day | ORAL | Status: DC

## 2013-10-10 NOTE — Addendum Note (Signed)
Addended by: Elease Etienne A on: 10/10/2013 09:07 AM   Modules accepted: Orders

## 2013-10-10 NOTE — Telephone Encounter (Signed)
Pt will be in towards the end of January for a CPE

## 2013-10-13 ENCOUNTER — Encounter: Payer: Self-pay | Admitting: *Deleted

## 2013-10-13 ENCOUNTER — Ambulatory Visit (HOSPITAL_COMMUNITY): Payer: BC Managed Care – PPO

## 2013-10-13 ENCOUNTER — Telehealth: Payer: Self-pay | Admitting: *Deleted

## 2013-10-13 DIAGNOSIS — I739 Peripheral vascular disease, unspecified: Secondary | ICD-10-CM

## 2013-10-13 NOTE — Telephone Encounter (Signed)
Message copied by Marella Bile on Mon Oct 13, 2013  8:18 PM ------      Message from: [name]      Created: Mon Oct 06, 2013  3:44 PM       No change from prior study. Repeat in 12 months. ------

## 2013-10-13 NOTE — Telephone Encounter (Signed)
Order placed for repeat upper extremity doppler in 1 year.

## 2013-10-28 ENCOUNTER — Ambulatory Visit (INDEPENDENT_AMBULATORY_CARE_PROVIDER_SITE_OTHER): Payer: BC Managed Care – PPO | Admitting: Cardiovascular Disease

## 2013-10-28 ENCOUNTER — Encounter: Payer: Self-pay | Admitting: Cardiovascular Disease

## 2013-10-28 ENCOUNTER — Other Ambulatory Visit: Payer: Self-pay | Admitting: Family Medicine

## 2013-10-28 VITALS — BP 122/84 | HR 71 | Ht 65.0 in | Wt 180.0 lb

## 2013-10-28 DIAGNOSIS — I6529 Occlusion and stenosis of unspecified carotid artery: Secondary | ICD-10-CM

## 2013-10-28 DIAGNOSIS — I771 Stricture of artery: Secondary | ICD-10-CM

## 2013-10-28 DIAGNOSIS — E785 Hyperlipidemia, unspecified: Secondary | ICD-10-CM

## 2013-10-28 DIAGNOSIS — I708 Atherosclerosis of other arteries: Secondary | ICD-10-CM

## 2013-10-28 DIAGNOSIS — F411 Generalized anxiety disorder: Secondary | ICD-10-CM

## 2013-10-28 NOTE — Assessment & Plan Note (Signed)
On statin therapy with recent lipid profile performed 02/25/13 revealed a total cholesterol 170, LDL 87 HDL of 53

## 2013-10-28 NOTE — Progress Notes (Signed)
10/28/2013 Bethany Clarke   04/18/1956  [id number]  Primary Physician Shade FloodGREENE,JEFFREY R, MD Primary Cardiologist: Runell GessJonathan J. Patrizia Paule MD FACP,FACC,FAHA, FSCAI   HPI:  . The patient is a 58 year old female, who has a history of hypothyroidism and hyperlipidemia since 1999. In September 2013, she apparently had an endoscopy for epigastric discomfort and was treated with Nexium. She also was told to have sludge in her gallbladder and some inflammation of her bile duct, for which she has seen Dr. Marina GoodellPerry. The patient has a very strong family history for coronary artery disease with a sister, who underwent CABG surgery at age 58, as well as a father, who died at a young age with cancer but also had heart disease, and also many family members with stroke and heart disease. The patient admits to a 44 year history of tobacco use, having started at age 58. She has noticed recurrent episodes of epigastric and somewhat right-sided chest discomfort. She also has noticed a progressive decline in activity with development of exertional shortness of breath. Approximately three weeks ago, she was awakened from sleep with a lower sternal epigastric discomfort, which radiated to her back and was associated with jaw discomfort. She was evaluated at urgent care. In light of her strong family history and progressive symptomatology after my initial evaluation on 02/19/2013, and a cardiac catheterization was performed on Mar 03, 2013. His reveal normal LV function without definite obstructive disease. Her left artery system was entirely normal. She did have mild ostial calcification of her right coronary artery without stenosis.  Bethany Clarke admits to arm fatigue and weakness. When I saw her initially on may 7,2014 she did have a 30 mm blood pressure differential between her right and left arm. Subsequent carotid duplex imaging suggested proximal left subclavian occlusion. She did have 0-49% stenosis in the right internal  carotid artery and had progressive stenosis in her left mid internal carotid now showing a 50-69% diameter reduction. She had retrograde flow in her left vertebral artery suggestive of subclavian steal. In retrospect, she does admit to episodes of dizziness. Upper extremity duplex imaging revealed a 38 mm reduction in left brachial artery pressure.  A 2-D echo Doppler study showed ejection fraction of 55-60% with normal diastolic parameters and mildly thickened mitral valve leaflets appear today to further discuss potential peripheral vascular catheterization and potential for intervention in the left subclavian artery.  She underwent aortic arch angiography and bilateral subclavian angiography revealing a 99% proximal left subclavian artery stenosis with retrograde left vertebral filling. I started her proximal/ostial left subclavian artery with excellent angiographic results. She was discharged on the following day. Her followup Dopplers were normal. Since discharge she no longer has MA claudication. Her dizziness has resolved as well.  She presents today for to evaluate bilateral foot pain that is new over the last 2 weeks. The pain is in the anterior portion of her foot records for seconds at a time without relationship to activity. There is a neuropathic component. Her exam is completely normal and she has 2+ pedal pulses.  I performed a left subclavian artery angiography, PTCA and stenting in May of last year with marked improvement in her subclavian steal symptoms and her left upper; the patient. Assessment Doppler is normalized. She did stop smoking at that time. She's gained 20 pounds and complains of shortness of breath. She had angiographic documented normal coronary arteries by cath performed Dr. Tresa EndoKelly.      Current Outpatient Prescriptions  Medication Sig  Dispense Refill  . aspirin EC 325 MG EC tablet Take 1 tablet (325 mg total) by mouth daily.  30 tablet  0  . atorvastatin (LIPITOR) 40 MG  tablet Take 40 mg by mouth daily.      . clonazePAM (KLONOPIN) 0.5 MG tablet TAKE 1 TABLET BY MOUTH TWICE A DAY AS NEEDED FOR ANXIETY  45 tablet  0  . clopidogrel (PLAVIX) 75 MG tablet Take 1 tablet (75 mg total) by mouth daily with breakfast.  30 tablet  11  . diphenhydramine-acetaminophen (TYLENOL PM) 25-500 MG TABS Take 4 tablets by mouth at bedtime as needed (for sleep).      Marland Kitchen ibuprofen (ADVIL,MOTRIN) 800 MG tablet Take 1 tablet (800 mg total) by mouth every 8 (eight) hours as needed.  21 tablet  0  . levothyroxine (SYNTHROID) 100 MCG tablet Take 1 tablet (100 mcg total) by mouth daily before breakfast. Need office visit for additional refills.  30 tablet  0   No current facility-administered medications for this visit.    No Known Allergies  History   Social History  . Marital Status: Married    Spouse Name: N/A    Number of Children: 3  . Years of Education: N/A   Occupational History  . 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 Smoking status: Former Smoker -- 0.50 packs/day for 40 years    Types: Cigarettes    Quit date: 02/11/2013  . Smokeless tobacco: Never Used  . Alcohol Use: No  . Drug Use: No  . Sexual Activity: Yes     Comment: number of sex partners in the last 12 months  1   Other Topics Concern  . Not on file   Social History Narrative   Daily caffeine      Review of Systems: General: negative for chills, fever, night sweats or weight changes.  Cardiovascular: negative for chest pain, dyspnea on exertion, edema, orthopnea, palpitations, paroxysmal nocturnal dyspnea or shortness of breath Dermatological: negative for rash Respiratory: negative for cough or wheezing Urologic: negative for hematuria Abdominal: negative for nausea, vomiting, diarrhea, bright red blood per rectum, melena, or hematemesis Neurologic: negative for visual changes, syncope, or dizziness All other systems reviewed and are otherwise negative except as noted  above.    Blood pressure 122/84, pulse 71, height 5\' 5"  (1.651 m), weight 180 lb (81.647 kg).  General appearance: cooperative and no distress Neck: no adenopathy, no carotid bruit, no JVD, supple, symmetrical, trachea midline and thyroid not enlarged, symmetric, no tenderness/mass/nodules Lungs: clear to auscultation bilaterally Heart: regular rate and rhythm, S1, S2 normal, no murmur, click, rub or gallop Extremities: extremities normal, atraumatic, no cyanosis or edema  EKG normal sinus rhythm at 71 with nonspecific ST and T wave changes  ASSESSMENT AND PLAN:   Subclavian artery stenosis, left I performed left subclavian artery intervention by myself in May of last year for symptomatic left subclavian artery stenosis with subclavian steal syndrome. Her symptoms resolved. Dopplers normalize. She is no longer symptomatic. We will continue to follow her carotid and upper extremity arterial Doppler studies on an annual basis.  Hyperlipidemia LDL goal < 70 On statin therapy with recent lipid profile performed 02/25/13 revealed a total cholesterol 170, LDL 87 HDL of 53      [name] MD [facility], [facility] 10/28/2013 11:46 AM

## 2013-10-28 NOTE — Patient Instructions (Signed)
Follow up with Dr Tresa EndoKelly in May 2015 and Dr Allyson SabalBerry only as needed.

## 2013-10-28 NOTE — Assessment & Plan Note (Signed)
I performed left subclavian artery intervention by myself in May of last year for symptomatic left subclavian artery stenosis with subclavian steal syndrome. Her symptoms resolved. Dopplers normalize. She is no longer symptomatic. We will continue to follow her carotid and upper extremity arterial Doppler studies on an annual basis.

## 2013-10-30 NOTE — Telephone Encounter (Signed)
She was due for an ov prior to any further refills when prescribed in December.  Plan at our most recent office visit In July of last year was to return in 2 months to discuss anxiety treatment further. I see she is scheduled to see me in the office this Monday, and in good faith, I will prescribe this again today, but she must keep that appointment. Will not refill beyond this time if does not keep that appointment. Let me know if there are any questions.

## 2013-10-30 NOTE — Telephone Encounter (Signed)
[facility] to advise must keep her appt.

## 2013-11-03 ENCOUNTER — Ambulatory Visit (INDEPENDENT_AMBULATORY_CARE_PROVIDER_SITE_OTHER): Payer: BC Managed Care – PPO | Admitting: Family Medicine

## 2013-11-03 ENCOUNTER — Encounter: Payer: Self-pay | Admitting: Family Medicine

## 2013-11-03 VITALS — BP 112/70 | HR 74 | Temp 98.1°F | Resp 16 | Ht 64.75 in | Wt 183.4 lb

## 2013-11-03 DIAGNOSIS — R0600 Dyspnea, unspecified: Secondary | ICD-10-CM

## 2013-11-03 DIAGNOSIS — R0609 Other forms of dyspnea: Secondary | ICD-10-CM

## 2013-11-03 DIAGNOSIS — F341 Dysthymic disorder: Secondary | ICD-10-CM

## 2013-11-03 DIAGNOSIS — R42 Dizziness and giddiness: Secondary | ICD-10-CM

## 2013-11-03 DIAGNOSIS — G4700 Insomnia, unspecified: Secondary | ICD-10-CM

## 2013-11-03 DIAGNOSIS — R0989 Other specified symptoms and signs involving the circulatory and respiratory systems: Secondary | ICD-10-CM

## 2013-11-03 DIAGNOSIS — E039 Hypothyroidism, unspecified: Secondary | ICD-10-CM

## 2013-11-03 DIAGNOSIS — F418 Other specified anxiety disorders: Secondary | ICD-10-CM

## 2013-11-03 MED ORDER — LEVOTHYROXINE SODIUM 100 MCG PO TABS: 100.0000 ug | ORAL_TABLET | Freq: Every day | ORAL | Status: DC

## 2013-11-03 MED ORDER — FLUOXETINE HCL 20 MG PO CAPS: 20.0000 mg | ORAL_CAPSULE | Freq: Every day | ORAL | Status: DC

## 2013-11-03 NOTE — Progress Notes (Signed)
Subjective:    Patient ID: Bethany Clarke, female    DOB: 1955/12/09, 58 y.o.   MRN: [id number]  This chart was scribed for Bethany Flood, MD by Blanchard Kelch, ED Scribe. The patient was seen in room 21. Patient's care was started at 2:53 PM.  Chief Complaint  Patient presents with  . Medication Refill  . Shortness of Breath    been going on for years  . lightheadiness   PCP: Bethany Flood, MD  HPI  Bethany Clarke is a 58 y.o. female who presents to office for a follow up. Last seen by me  04/28/13 with initial plan of follow up in two months. See telephone notes since that time.   Anxiety: thought to have situation component prior but at July visit was taking Klonopin twice per day during week and once on weekends. Recommend counseling at that point but she stated she would not listen to other peoples opinions and did not want to see psychiatrist or psychologist due to this. Also declined SSRI and planned to follow up in two months after trying exercise and working on quitting smoking. Last refill of Klonopin 0.5 mg BID PRN was for #45 on 10/30/13. Prior to that was 09/13/13. Did discuss at last office visit that long term daily use of Klonopin not ideal treatment for anxiety. She is taking Klonopin between once a day to every other day. She states she is sleeping well. She takes six to eight Tylenol PM every night to sleep, unless she takes a Klonopin that day, in which case she takes less. She has been taking that dose for about four years every night. She states she historically had trouble both falling asleep and staying asleep. However, on the medication she can fall asleep and gets a full night sleep. She goes to sleep around 10:00 pm and wakes up at 5:00 am. She denies a history of sleep apnea and denies being/ She denies ever taking Xanax or similar medication for sleep. She denies attempting to take her Klonopin at night to help her sleep. She has tried lower dosage of the  Tylenol PM, but states it does not work. She also states that she has recently decaffeinated herself. She states she has been feeling depressed lately. She denies SI. She denies any alcohol use recently as a coping mechanism or ever.   Dyspnea: Discussed possible COPD at last visit with previous hyperinflated chest x-ray. Spirometry on 7/14 showed FVC 69 and FEV1 54 indicating moderate obstruction. Discussed restarting Spiriva, 1 cap QD. Appeared to upset her in discussion at last visit which possibly due to her recent passing of her brother, who was using this medication, but would not discuss concerns in office further when asked directly about her appearing to be upset. See last office visit note for details. Last chest x-ray 08/20/13 showing minimal right basilar platelike atelectasis. She stopped smoking 02/12/13. She used Chantix but states it made her chest feel tight, so she stopped taking it. She states she feels more short of breath since she stopped smoking. She is not using any inhalers or breathing machines at home for the dyspnea. She is concerned about recent 30 lb weight gain after quitting smoking. She is still adamant about not starting medication until attempting to lose weight, even after being told that this is not a treatment for COPD. She is not working out currently but would like to start.    Hypothyroidism: Last TSH 1.26, 02/2013. Takes Synthroid  100 mcg QED.   Light-headedness: History of subclavian artery stenosis and subclavian steal syndrome. Status post left subclavian artery intervention by Dr. Allyson Sabal 10/28/13 with plan of carotid and upper extremity doppler on annual basis. Last upper extremity doppler 12/17, no change from prior study. Carotid doppler 09/25/13, no change from prior study. She states the lightheadedness has been ongoing for awhile, all day. She denies any recent changes. It comes on with sudden movements. She states that Dr. Allyson Sabal did not believe it was related to  the heart or veins. Normal hemoglobin 14.3, 08/2013. EKG showed sinus rhythm, no acute changes 1/13 at Cardiologists office, nonspecific ST/T wave changes but no apparent changes from prior.   She needs refills of Synthroid today. She should have enough of other medications until next office visit.    Patient Active Problem List   Diagnosis Date Noted  . Paresthesia 07/14/2013  . Bilateral foot pain 05/01/2013  . Subclavian artery stenosis, left   . Subclavian steal syndrome 03/16/2013  . Subclavian arterial stenosis 03/11/2013  . Carotid stenosis 03/11/2013  . Hyperlipidemia LDL goal < 70 03/11/2013  . GERD (gastroesophageal reflux disease)   . Hypothyroid   . Dizziness   . Syncope, near   . Heart burn   . HYPOTHYROIDISM 05/28/2007  . ASTHMA 05/28/2007  . COPD 05/28/2007   Past Medical History  Diagnosis Date  . GERD (gastroesophageal reflux disease)   . Hypothyroid   . Dizziness   . Syncope, near   . Heart burn   . H. pylori infection   . Atherosclerosis 11/14/2005    carotid US - mild calcific non-stenotic plaque bilaterally  . Carotid bruit 02/05/2009    Doppler - R/L ICAs 0-49% diameter reductin (velocities suggest low-mid range); L subclavian 0-49%  diameter reduction  . Lipidemia   . Emphysema of lung   . Pneumonia ~ 2009  . Chronic bronchitis     "growing up; not anymore" (03/14/2013)  . Subclavian artery stenosis, left     subclavian steal physiology status post PTA and stenting   Past Surgical History  Procedure Laterality Date  . Abdominal hysterectomy  1999  . Subclavian artery stent Left 03/14/2013     for subclavian steal/claudication (03/14/2013)  . Dilation and curettage of uterus  1980's  . Tubal ligation  1980's  . Breast biopsy Right 1990's    "benign" (03/14/2013)  . Cardiac catheterization  03/03/2013   No Known Allergies Prior to Admission medications   Medication Sig Start Date End Date Taking? Authorizing Provider  aspirin EC 325 MG EC tablet  Take 1 tablet (325 mg total) by mouth daily. 03/15/13  Yes Wilburt Finlay, PA-C  atorvastatin (LIPITOR) 40 MG tablet Take 40 mg by mouth daily. 03/11/13  Yes Lennette Bihari, MD  clonazePAM (KLONOPIN) 0.5 MG tablet TAKE 1 TABLET BY MOUTH TWICE A DAY AS NEEDED FOR ANXIETY 10/28/13  Yes Bethany Flood, MD  clopidogrel (PLAVIX) 75 MG tablet Take 1 tablet (75 mg total) by mouth daily with breakfast. 03/15/13  Yes Wilburt Finlay, PA-C  diphenhydramine-acetaminophen (TYLENOL PM) 25-500 MG TABS Take 6 tablets by mouth at bedtime as needed (for sleep).    Yes Historical Provider, MD  levothyroxine (SYNTHROID) 100 MCG tablet Take 1 tablet (100 mcg total) by mouth daily before breakfast. Need office visit for additional refills. 10/10/13  Yes Heather M Marte, PA-C  ibuprofen (ADVIL,MOTRIN) 800 MG tablet Take 1 tablet (800 mg total) by mouth every 8 (eight) hours as needed.  09/05/13   Trixie DredgeEmily West, PA-C   History   Social History  . Marital Status: Married    Spouse Name: N/A    Number of Children: 3  . Years of Education: N/A   Occupational History  . 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 Smoking status: Former Smoker -- 0.50 packs/day for 40 years    Types: Cigarettes    Quit date: 02/11/2013  . Smokeless tobacco: Never Used  . Alcohol Use: No  . Drug Use: No  . Sexual Activity: Yes     Comment: number of sex partners in the last 12 months  1   Other Topics Concern  . Not on file   Social History Narrative   Daily caffeine      Review of Systems  Constitutional: Negative for appetite change and fatigue.  HENT: Negative for congestion, ear discharge and sinus pressure.   Eyes: Negative for discharge.  Respiratory: Positive for shortness of breath. Negative for cough.   Cardiovascular: Negative for chest pain.  Gastrointestinal: Negative for abdominal pain and diarrhea.  Genitourinary: Negative for frequency and hematuria.  Musculoskeletal: Negative for back pain.  Skin: Negative for  rash.  Neurological: Positive for dizziness and light-headedness. Negative for seizures and headaches.  Psychiatric/Behavioral: Negative for hallucinations.       Objective:   Physical Exam  Vitals reviewed. Constitutional: She is oriented to person, place, and time. She appears well-developed and well-nourished. No distress.  HENT:  Head: Normocephalic and atraumatic.  Right Ear: Hearing, tympanic membrane, external ear and ear canal normal.  Left Ear: Hearing, tympanic membrane, external ear and ear canal normal.  Nose: Nose normal.  Mouth/Throat: Oropharynx is clear and moist. No oropharyngeal exudate.  Eyes: Conjunctivae and EOM are normal. Pupils are equal, round, and reactive to light.  Cardiovascular: Normal rate, regular rhythm, normal heart sounds and intact distal pulses.   No murmur heard. Pulmonary/Chest: Effort normal. No respiratory distress. She has no wheezes. She has no rhonchi.  Slight distant breath sounds. No audible wheeze. Minimal wheeze with forced expiration.   Neurological: She is alert and oriented to person, place, and time.  Skin: Skin is warm and dry. No rash noted.  Psychiatric: Her speech is normal and behavior is normal. Thought content normal. Her affect is blunt. She expresses no suicidal ideation.  Flat affect, tearful at times at OV.      Filed Vitals:   11/03/13 1433  BP: 112/70  Pulse: 74  Temp: 98.1 F (36.7 C)  TempSrc: Oral  Resp: 16  Height: 5' 4.75" (1.645 m)  Weight: 183 lb 6.4 oz (83.19 kg)  SpO2: 95%        Assessment & Plan:   Arville LimeKatrina Y Schuur is a 58 y.o. female Dyspnea - Plan: Ambulatory referral to Pulmonology, CBC with Differential, COMPLETE METABOLIC PANEL WITH GFR, CANCELED: POCT CBC  - discussed my concerns with her having probable COPD or other cause of obstructive dz as discussed at last ov with objective findings on CXR and spirometry. She again refused any medications for her breathing, stating that anyone she  has known to start these medicines progress in their disease. Also as she discussed last ov, her brother who passed was on SPiriava, but other medicines such as combivent discussed, but these were all refused. I commended her on quitting smoking last year which is a part of treatment, but has evidenced by drop in O2 sat with simple ambulation, she needs further  treatment. She again refused treatment and verbally voiced understanding of risks of nontreatment including but not limited to progression of disease, worsening hypoxia, collapse/sycope or death. She did agree to meeting with pulmonologist to discuss her symptoms and options, and advised against exertion until evaluated d/t drop in O2 with just ambulation.  ER/RTC precautions given prior to that ov.   Dizziness - refused blood work in office today. Did agree to return for lab only order.  SHe has been seen by cardiology and dizziness does not appear to be from heart or prior subclavian steal as her recent imaging for this is stable. Suspect her breathing/obstrucitve lung disease as above is the issue, and discussed again need for eval for this.   Hypothyroidism - Plan: levothyroxine (SYNTHROID) 100 MCG tablet, TSH, Lipid panel, CANCELED: Lipid panel, CANCELED: TSH - refilled synthroid in good faith as labs in office were refused but agreed to rtc for lab only visit soon.   Depression with anxiety - Plan: FLUoxetine (PROZAC) 20 MG capsule - agreed to trial of SSRI with frequent use of Klonopin and persistent insomnia that is likely anxiety related. Initial side effects discussed and ER/rtc precautions given.   Insomnia - discussed concerns with large amount of diphenhydramine, and possible contributor to dizziness with anticholinergic effects.  Can take klonopin at bedtime but lowest effective doses and concerns with her breathing disorder discussed. prozac started.   Meds ordered this encounter  Medications  . levothyroxine (SYNTHROID) 100 MCG  tablet    Sig: Take 1 tablet (100 mcg total) by mouth daily before breakfast. Need office visit for additional refills.    Dispense:  90 tablet    Refill:  1  . FLUoxetine (PROZAC) 20 MG capsule    Sig: Take 1 capsule (20 mg total) by mouth daily.    Dispense:  30 capsule    Refill:  2   Patient Instructions  Your oxygen dropped to 91% with walking today and recommend treatment for suspected COPD, as we discussed, as this is likely the cause of your difficulty breathing. I am concerned that this may worsen if untreated and encourage you to return and discuss with me if you are open to medications to treat it but will also refer you to Pulmonary to discuss this further. If your symptoms worsen, call 911 or go to the emergency room.   The dizziness may be from your breathing but also recommend taking the very high doses of Tylenol PM, as you have been, as benadryl can cause dizziness and may also interfere with your ability to breath at night. You can take Klonopin, one to two pills at bedtime if needed for sleep or anxiety, but if this is not improving your symptoms, return to discuss other options. Do not mix this medicine with Tylenol PM.   Will start Prozac for anxiety symptoms. Recheck in the next six weeks to discuss this medicine further.   Fasting labs in the next few days. You should receive a call or letter about your lab results within the next week to 10 days.   Return to the clinic or go to the nearest emergency room if any of your symptoms worsen or new symptoms occur.    .I personally performed the services described in this documentation, which was scribed in my presence. The recorded information has been reviewed and considered, and addended by me as needed.

## 2013-11-03 NOTE — Patient Instructions (Signed)
Your oxygen dropped to 91% with walking today and recommend treatment for suspected COPD, as we discussed, as this is likely the cause of your difficulty breathing. I am concerned that this may worsen if untreated and encourage you to return and discuss with me if you are open to medications to treat it but will also refer you to Pulmonary to discuss this further. If your symptoms worsen, call 911 or go to the emergency room.   The dizziness may be from your breathing but also recommend taking the very high doses of Tylenol PM, as you have been, as benadryl can cause dizziness and may also interfere with your ability to breath at night. You can take Klonopin, one to two pills at bedtime if needed for sleep or anxiety, but if this is not improving your symptoms, return to discuss other options. Do not mix this medicine with Tylenol PM.   Will start Prozac for anxiety symptoms. Recheck in the next six weeks to discuss this medicine further.   Fasting labs in the next few days. You should receive a call or letter about your lab results within the next week to 10 days.   Return to the clinic or go to the nearest emergency room if any of your symptoms worsen or new symptoms occur.

## 2013-11-17 ENCOUNTER — Ambulatory Visit (INDEPENDENT_AMBULATORY_CARE_PROVIDER_SITE_OTHER)
Admission: RE | Admit: 2013-11-17 | Discharge: 2013-11-17 | Disposition: A | Discharge status: Home / Self Care | Payer: BC Managed Care – PPO | Source: Ambulatory Visit | Attending: Pulmonary Disease | Admitting: Pulmonary Disease

## 2013-11-17 ENCOUNTER — Encounter: Payer: Self-pay | Admitting: Pulmonary Disease

## 2013-11-17 ENCOUNTER — Other Ambulatory Visit: Payer: BC Managed Care – PPO

## 2013-11-17 ENCOUNTER — Ambulatory Visit (INDEPENDENT_AMBULATORY_CARE_PROVIDER_SITE_OTHER): Payer: BC Managed Care – PPO | Admitting: Pulmonary Disease

## 2013-11-17 VITALS — BP 112/68 | HR 76 | Temp 98.1°F | Ht 65.0 in | Wt 184.4 lb

## 2013-11-17 DIAGNOSIS — J449 Chronic obstructive pulmonary disease, unspecified: Secondary | ICD-10-CM | POA: Insufficient documentation

## 2013-11-17 DIAGNOSIS — J441 Chronic obstructive pulmonary disease with (acute) exacerbation: Secondary | ICD-10-CM

## 2013-11-17 DIAGNOSIS — R0602 Shortness of breath: Secondary | ICD-10-CM

## 2013-11-17 NOTE — Assessment & Plan Note (Addendum)
COPD: GOLD Grade D Combined recommendations from the [employer], [employer], Designer, television/film setAmerican Thoracic Society, European Respiratory Society (Qaseem A et al, Ann Intern Med. 2011;155(3):179) recommends tobacco cessation, pulmonary rehab (for symptomatic patients with an FEV1 < 50% predicted), supplemental oxygen (for patients with SaO2 <88% or paO2 <55), and appropriate bronchodilator therapy.  In regards to long acting bronchodilators, they recommend monotherapy (FEV1 60-80% with symptoms weak evidence, FEV1 with symptoms <60% strong evidence), or combination therapy (FEV1 <60% with symptoms, strong recommendation, moderate evidence).  One should also provide patients with annual immunizations and consider therapy for prevention of COPD exacerbations (ie. roflumilast or azithromycin) when appopriate.  -O2 therapy: Not indicated -Immunizations: UTD -Tobacco use: quit 2014 -Exercise: encouraged regular exercise -Bronchodilator therapy: See discussion below -Exacerbation prevention:see below -given family history obtain AAT level  I spent a long time counseling Bethany Clarke about her COPD. I explained to her that she has moderate airflow obstruction but severe symptoms, making her a GOLD D.  The only therapy available is inhaled bronchodilators and inhaled steroids, roflumilast is not an option.  She adamantly is opposed to inhaled therapies due to problems her brother had with it.  I advised that she stay active, lose weight, get an annual flu shot. If she changes her mind about the starting Spiriva (my recommendation) then she can see me again.  Otherwise there is no really point in her seeing me right now since she doesn't want the therapy we have to offer.  Greater than 45 minutes in counseling/interview

## 2013-11-17 NOTE — Progress Notes (Signed)
Subjective:    Patient ID: Bethany Clarke, female    DOB: May 30, 1956, 58 y.o.   MRN: [id number]  HPI  Bethany Clarke is here to see me for COPD. She has two siblings with COPD and actually her broather died of COPD a year ago today.  She has had dyspnea and cough for at least ten years but has never had spirometry to her knowledge. She has had frequently bouts of bronchitis, has been told that she had emphysema after a CXR ten years ago.    She smoed 0.5 ppd for 40 years and quit in April 2014.    She notes dyspnea frequently which has worsened since quitting smoking. She attributes this to gaining 30 lbs in the last year.  She does not have dyspnea at rest, only with exertion. She will get short of breath climbing a flight of stairs.  She recently was able to walk about a 1/4 of a mile but had to stop due to dyspnea.  She coughs frequently but she doesn't produce mucus.    She also notes left chest pain and has bee nfound to have peripharl arterial disease but no CAD.  She had a stent placed in her left subclavian artery.  She has a strong family history of COPD.  She has tried inhalers in the past but didn't like the way it made her feel.   Past Medical History  Diagnosis Date  . GERD (gastroesophageal reflux disease)   . Hypothyroid   . Dizziness   . Syncope, near   . Heart burn   . H. pylori infection   . Atherosclerosis 11/14/2005    carotid US - mild calcific non-stenotic plaque bilaterally  . Carotid bruit 02/05/2009    Doppler - R/L ICAs 0-49% diameter reductin (velocities suggest low-mid range); L subclavian 0-49%  diameter reduction  . Lipidemia   . Emphysema of lung   . Pneumonia ~ 2009  . Chronic bronchitis     "growing up; not anymore" (03/14/2013)  . Subclavian artery stenosis, left     subclavian steal physiology status post PTA and stenting     Family History  Problem Relation Age of Onset  . Colon cancer Neg Hx   . Cancer Brother     spine/lung  . COPD  Brother   . Hypertension Brother   . Rheum arthritis Brother   . Diabetes Mother   . Heart disease Mother   . Stroke Mother   . Cancer Father     cirrohosis  . Hypertension Father   . Heart disease Sister   . Arthritis Sister     rheumatoid  . COPD Sister   . Arthritis Sister     rheumatoid  . Diabetes Sister      History   Social History  . Marital Status: Married    Spouse Name: N/A    Number of Children: 3  . Years of Education: N/A   Occupational History  . 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 Smoking status: Former Smoker -- 0.50 packs/day for 40 years    Types: Cigarettes    Quit date: 02/11/2013  . Smokeless tobacco: Never Used  . Alcohol Use: No  . Drug Use: No  . Sexual Activity: Yes     Comment: number of sex partners in the last 12 months  1   Other Topics Concern  . Not on file   Social History Narrative   Daily caffeine  No Known Allergies   Outpatient Prescriptions Prior to Visit  Medication Sig Dispense Refill  . aspirin EC 325 MG EC tablet Take 1 tablet (325 mg total) by mouth daily.  30 tablet  0  . atorvastatin (LIPITOR) 40 MG tablet Take 40 mg by mouth daily.      . clonazePAM (KLONOPIN) 0.5 MG tablet TAKE 1 TABLET BY MOUTH TWICE A DAY AS NEEDED FOR ANXIETY  45 tablet  0  . clopidogrel (PLAVIX) 75 MG tablet Take 1 tablet (75 mg total) by mouth daily with breakfast.  30 tablet  11  . FLUoxetine (PROZAC) 20 MG capsule Take 1 capsule (20 mg total) by mouth daily.  30 capsule  2  . levothyroxine (SYNTHROID) 100 MCG tablet Take 1 tablet (100 mcg total) by mouth daily before breakfast. Need office visit for additional refills.  90 tablet  1  . diphenhydramine-acetaminophen (TYLENOL PM) 25-500 MG TABS Take 6 tablets by mouth at bedtime as needed (for sleep).       Marland Kitchen. ibuprofen (ADVIL,MOTRIN) 800 MG tablet Take 1 tablet (800 mg total) by mouth every 8 (eight) hours as needed.  21 tablet  0   No facility-administered medications prior  to visit.      Review of Systems  Constitutional: Positive for unexpected weight change. Negative for fever.  HENT: Positive for congestion. Negative for dental problem, ear pain, nosebleeds, postnasal drip, rhinorrhea, sinus pressure, sneezing, sore throat and trouble swallowing.   Eyes: Negative for redness and itching.  Respiratory: Positive for chest tightness and shortness of breath. Negative for cough and wheezing.   Cardiovascular: Negative for palpitations and leg swelling.  Gastrointestinal: Negative for nausea and vomiting.  Genitourinary: Negative for dysuria.  Musculoskeletal: Negative for joint swelling.  Skin: Negative for rash.  Neurological: Negative for headaches.  Hematological: Does not bruise/bleed easily.  Psychiatric/Behavioral: Positive for dysphoric mood. The patient is nervous/anxious.        Objective:   Physical Exam  Filed Vitals:   11/17/13 1330  BP: 112/68  Pulse: 76  Temp: 98.1 F (36.7 C)  TempSrc: Oral  Height: 5\' 5"  (1.651 m)  Weight: 184 lb 6.4 oz (83.643 kg)  SpO2: 96%   Gen: well appearing, no acute distress HEENT: NCAT, PERRL, EOMi, OP clear, neck supple without masses PULM: Wheezing bilaterally, normal air movement CV: RRR, no mgr, no JVD AB: BS+, soft, nontender, no hsm Ext: warm, no edema, no clubbing, no cyanosis Derm: no rash or skin breakdown Neuro: A&Ox4, CN II-XII intact, strength 5/5 in all 4 extremities       Assessment & Plan:   COPD COPD: GOLD Grade D Combined recommendations from the [employer], [employer], Designer, television/film setAmerican Thoracic Society, European Respiratory Society (Qaseem A et al, Ann Intern Med. 2011;155(3):179) recommends tobacco cessation, pulmonary rehab (for symptomatic patients with an FEV1 < 50% predicted), supplemental oxygen (for patients with SaO2 <88% or paO2 <55), and appropriate bronchodilator therapy.  In regards to long acting bronchodilators, they recommend  monotherapy (FEV1 60-80% with symptoms weak evidence, FEV1 with symptoms <60% strong evidence), or combination therapy (FEV1 <60% with symptoms, strong recommendation, moderate evidence).  One should also provide patients with annual immunizations and consider therapy for prevention of COPD exacerbations (ie. roflumilast or azithromycin) when appopriate.  -O2 therapy: Not indicated -Immunizations: UTD -Tobacco use: quit 2014 -Exercise: encouraged regular exercise -Bronchodilator therapy: See discussion below -Exacerbation prevention:see below -given family history obtain AAT level  I spent a  long time counseling Bethany Clarke about her COPD. I explained to her that she has moderate airflow obstruction but severe symptoms, making her a GOLD D.  The only therapy available is inhaled bronchodilators and inhaled steroids, roflumilast is not an option.  She adamantly is opposed to inhaled therapies due to problems her brother had with it.  I advised that she stay active, lose weight, get an annual flu shot. If she changes her mind about the starting Spiriva (my recommendation) then she can see me again.  Otherwise there is no really point in her seeing me right now since she doesn't want the therapy we have to offer.  Greater than 45 minutes in counseling/interview   Abnormal CXR> she had atelectasis on a 08/2013 CXR.  We repeated the CXR today and this had improved significantly  Updated Medication List Outpatient Encounter Prescriptions as of 11/17/2013  Medication Sig  . aspirin EC 325 MG EC tablet Take 1 tablet (325 mg total) by mouth daily.  Marland Kitchen atorvastatin (LIPITOR) 40 MG tablet Take 40 mg by mouth daily.  . clonazePAM (KLONOPIN) 0.5 MG tablet TAKE 1 TABLET BY MOUTH TWICE A DAY AS NEEDED FOR ANXIETY  . clopidogrel (PLAVIX) 75 MG tablet Take 1 tablet (75 mg total) by mouth daily with breakfast.  . FLUoxetine (PROZAC) 20 MG capsule Take 1 capsule (20 mg total) by mouth daily.  Marland Kitchen levothyroxine  (SYNTHROID) 100 MCG tablet Take 1 tablet (100 mcg total) by mouth daily before breakfast. Need office visit for additional refills.  . [DISCONTINUED] diphenhydramine-acetaminophen (TYLENOL PM) 25-500 MG TABS Take 6 tablets by mouth at bedtime as needed (for sleep).   . [DISCONTINUED] ibuprofen (ADVIL,MOTRIN) 800 MG tablet Take 1 tablet (800 mg total) by mouth every 8 (eight) hours as needed.

## 2013-11-17 NOTE — Patient Instructions (Signed)
I recommend that you take Spiriva daily If you decide to take it let us know and we will call you a prescription  Exercise regularly Always get a flu shot  Wash your hands a lot when out in public  Let us know if you wish to be seen here again

## 2013-11-28 LAB — ALPHA-1-ANTITRYPSIN DEFICIENCY

## 2013-12-01 ENCOUNTER — Encounter: Payer: Self-pay | Admitting: Pulmonary Disease

## 2013-12-03 ENCOUNTER — Telehealth: Payer: Self-pay

## 2013-12-03 NOTE — Telephone Encounter (Signed)
Pt aware of lab results.  Nothing further needed. 

## 2013-12-03 NOTE — Telephone Encounter (Signed)
Message copied by Velvet BatheAULFIELD, ASHLEY L on Wed Dec 03, 2013  4:39 PM ------      Message from: Max FickleMCQUAID, DOUGLAS B      Created: Mon Dec 01, 2013  7:25 PM       A,            Please let her know this was normal            Thanks      B ------

## 2014-01-29 ENCOUNTER — Other Ambulatory Visit: Payer: Self-pay | Admitting: Family Medicine

## 2014-02-06 ENCOUNTER — Ambulatory Visit (INDEPENDENT_AMBULATORY_CARE_PROVIDER_SITE_OTHER): Payer: BC Managed Care – PPO | Admitting: Family Medicine

## 2014-02-06 VITALS — BP 138/76 | HR 77 | Temp 97.5°F | Resp 16 | Ht 64.0 in | Wt 179.0 lb

## 2014-02-06 DIAGNOSIS — E039 Hypothyroidism, unspecified: Secondary | ICD-10-CM

## 2014-02-06 DIAGNOSIS — R350 Frequency of micturition: Secondary | ICD-10-CM

## 2014-02-06 DIAGNOSIS — E785 Hyperlipidemia, unspecified: Secondary | ICD-10-CM

## 2014-02-06 DIAGNOSIS — R42 Dizziness and giddiness: Secondary | ICD-10-CM

## 2014-02-06 DIAGNOSIS — R110 Nausea: Secondary | ICD-10-CM

## 2014-02-06 DIAGNOSIS — R0600 Dyspnea, unspecified: Secondary | ICD-10-CM

## 2014-02-06 LAB — POCT UA - MICROSCOPIC ONLY
Bacteria, U Microscopic: NEGATIVE
Casts, Ur, LPF, POC: NEGATIVE
Crystals, Ur, HPF, POC: NEGATIVE
Mucus, UA: NEGATIVE
YEAST UA: NEGATIVE

## 2014-02-06 LAB — POCT CBC
Granulocyte percent: 49.2 %G (ref 37–80)
HCT, POC: 44.8 % (ref 37.7–47.9)
Hemoglobin: 14.3 g/dL (ref 12.2–16.2)
LYMPH, POC: 1.8 (ref 0.6–3.4)
MCH: 31.5 pg — AB (ref 27–31.2)
MCHC: 31.9 g/dL (ref 31.8–35.4)
MCV: 98.7 fL — AB (ref 80–97)
MID (CBC): 0.3 (ref 0–0.9)
MPV: 8.7 fL (ref 0–99.8)
PLATELET COUNT, POC: 318 10*3/uL (ref 142–424)
POC Granulocyte: 2.1 (ref 2–6.9)
POC LYMPH %: 42.9 % (ref 10–50)
POC MID %: 7.9 % (ref 0–12)
RBC: 4.54 M/uL (ref 4.04–5.48)
RDW, POC: 13 %
WBC: 4.3 10*3/uL — AB (ref 4.6–10.2)

## 2014-02-06 LAB — POCT URINALYSIS DIPSTICK
Bilirubin, UA: NEGATIVE
Blood, UA: NEGATIVE
Glucose, UA: NEGATIVE
Ketones, UA: NEGATIVE
Nitrite, UA: NEGATIVE
PROTEIN UA: NEGATIVE
SPEC GRAV UA: 1.01
UROBILINOGEN UA: 0.2
pH, UA: 7

## 2014-02-06 MED ORDER — MECLIZINE HCL 25 MG PO TABS: 25.0000 mg | ORAL_TABLET | Freq: Three times a day (TID) | ORAL | Status: DC | PRN

## 2014-02-06 NOTE — Progress Notes (Signed)
Marland Kitchenr Subjective:    Patient ID: CRYSTALYNN MCINERNEY, female    DOB: [birth year], 58 y.o.   MRN: [id number]     HPI Kayelynn MARCUS GROLL is a 58 y.o. female  Hx of anxiety, COPD, hyperlipidemia, carotid stenosis and subclavian artery syndrome - managed by Dr. Allyson Sabal. cardiac catheterization was performed on Mar 03, 2013.  normal LV function without definite obstructive disease. Her left artery system was entirely normal. She did have mild ostial calcification of her right coronary artery without stenosis.   Seen by pulmonologist in February - moderate airflow obstruction but severe symptoms, making her a GOLD D. Refused inhaled therapies due to problems her brother had with it., and no follow up scheduled as not interested in treatment for COPD.   Today here for:  She reports getting nauseous from the head down, "instead of from the stomach up". She describes it as a weird feeling and that her stomach does not hurt, she just feels like she needs to vomit. If she moves or turns quickly she can reproduce the feeling, this has been present for around a week. Pt has had vertigo before and she states that her current symptoms are different. She denies SOB and light headedness.  Notes with turning head quick, but not really with leaning head back.   She also states that her eyes will get "hot" and her vision will get a bit blurry. She has not traveled recently.  Hyperlipidemia - did not return for lab only visit.  No new side effects of Lipitor 40mg  qd. (at this dose since subclavian stent and also on plavix).   Hypothyroidism - no recent unexplained weight changes, or hot/cold intolerance.   Lab Results  Component Value Date   TSH 1.260 02/25/2013     Lab Results  Component Value Date   CHOL 170 02/25/2013   HDL 53 02/25/2013   LDLCALC 87 02/25/2013   TRIG 150* 02/25/2013   CHOLHDL 3.2 02/25/2013     Patient Active Problem List   Diagnosis Date Noted  . Paresthesia 07/14/2013  . Bilateral  foot pain 05/01/2013  . Subclavian artery stenosis, left   . Subclavian steal syndrome 03/16/2013  . Subclavian arterial stenosis 03/11/2013  . Carotid stenosis 03/11/2013  . Hyperlipidemia LDL goal < 70 03/11/2013  . GERD (gastroesophageal reflux disease)   . Hypothyroid   . Dizziness   . Syncope, near   . Heart burn   . HYPOTHYROIDISM 05/28/2007  . COPD 05/28/2007   Past Medical History  Diagnosis Date  . GERD (gastroesophageal reflux disease)   . Hypothyroid   . Dizziness   . Syncope, near   . Heart burn   . H. pylori infection   . Atherosclerosis 11/14/2005    carotid US - mild calcific non-stenotic plaque bilaterally  . Carotid bruit 02/05/2009    Doppler - R/L ICAs 0-49% diameter reductin (velocities suggest low-mid range); L subclavian 0-49%  diameter reduction  . Lipidemia   . Emphysema of lung   . Pneumonia ~ 2009  . Chronic bronchitis     "growing up; not anymore" (03/14/2013)  . Subclavian artery stenosis, left     subclavian steal physiology status post PTA and stenting   Past Surgical History  Procedure Laterality Date  . Abdominal hysterectomy  1999  . Subclavian artery stent Left 03/14/2013     for subclavian steal/claudication (03/14/2013)  . Dilation and curettage of uterus  1980's  . Tubal ligation  1980's  . Breast biopsy  Right 1990's    "benign" (03/14/2013)  . Cardiac catheterization  03/03/2013   No Known Allergies Prior to Admission medications   Medication Sig Start Date End Date Taking? Authorizing Provider  aspirin EC 325 MG EC tablet Take 1 tablet (325 mg total) by mouth daily. 03/15/13  Yes Wilburt FinlayBryan Hager, PA-C  atorvastatin (LIPITOR) 40 MG tablet Take 40 mg by mouth daily. 03/11/13  Yes Lennette Biharihomas A Kelly, MD  clopidogrel (PLAVIX) 75 MG tablet Take 1 tablet (75 mg total) by mouth daily with breakfast. 03/15/13  Yes Wilburt FinlayBryan Hager, PA-C  levothyroxine (SYNTHROID) 100 MCG tablet Take 1 tablet (100 mcg total) by mouth daily before breakfast. Need office visit  for additional refills. 11/03/13  Yes Shade FloodJeffrey R Ilaria Much, MD  clonazePAM (KLONOPIN) 0.5 MG tablet TAKE 1 TABLET BY MOUTH TWICE A DAY AS NEEDED FOR ANXIETY 10/28/13   Shade FloodJeffrey R Haifa Hatton, MD  FLUoxetine (PROZAC) 20 MG capsule Take 1 capsule (20 mg total) by mouth daily. PATIENT NEEDS FOLLOW-UP VISIT FOR ADDITIONAL REFILLS    Shade FloodJeffrey R Lulu Hirschmann, MD   History   Social History  . Marital Status: Married    Spouse Name: N/A    Number of Children: 3  . Years of Education: N/A   Occupational History  . 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 Smoking status: Former Smoker -- 0.50 packs/day for 40 years    Types: Cigarettes    Quit date: 02/11/2013  . Smokeless tobacco: Never Used  . Alcohol Use: No  . Drug Use: No  . Sexual Activity: Yes     Comment: number of sex partners in the last 12 months  1   Other Topics Concern  . Not on file   Social History Narrative   Daily caffeine         Review of Systems  Constitutional: Negative for fever, chills, fatigue and unexpected weight change.  HENT: Negative for ear discharge, ear pain, rhinorrhea and sinus pressure.   Respiratory: Negative for chest tightness and shortness of breath.   Cardiovascular: Negative for chest pain, palpitations and leg swelling.  Gastrointestinal: Positive for nausea. Negative for vomiting, abdominal pain and blood in stool.  Genitourinary: Positive for frequency (with change in odor of urine past month. drinking more fluid as well. no caffeine. ). Negative for dysuria, urgency, hematuria, flank pain, decreased urine volume, vaginal discharge and difficulty urinating.  Skin: Negative for rash.  Neurological: Positive for dizziness. Negative for syncope, light-headedness and headaches.       Objective:   Physical Exam  Vitals reviewed. Constitutional: She is oriented to person, place, and time. She appears well-developed and well-nourished.  HENT:  Head: Normocephalic and atraumatic.  Right Ear:  External ear normal.  Left Ear: External ear normal.  Mouth/Throat: Oropharynx is clear and moist. No oropharyngeal exudate.  Eyes: Conjunctivae are normal. Pupils are equal, round, and reactive to light. Right eye exhibits nystagmus. Right eye exhibits normal extraocular motion. Left eye exhibits nystagmus (1 beat horizontal to right, no vertical nystagmus. ). Left eye exhibits normal extraocular motion.  Neck: Trachea normal. Neck supple. Carotid bruit is not present.  Cardiovascular: Normal rate, regular rhythm, normal heart sounds and intact distal pulses.   Pulmonary/Chest: Effort normal and breath sounds normal.  Abdominal: Soft. She exhibits no pulsatile midline mass. There is no tenderness.  Neurological: She is alert and oriented to person, place, and time. She has normal strength. She displays no tremor. No cranial nerve deficit or  sensory deficit. She displays a negative Romberg sign. Coordination and gait normal. GCS eye subscore is 4. GCS verbal subscore is 5. GCS motor subscore is 6.  No pronator drift, normal heel to toe, nonfocal.   Skin: Skin is warm and dry.  Psychiatric: She has a normal mood and affect. Her behavior is normal.   Filed Vitals:   02/06/14 1157 02/06/14 1159  BP: 142/74 138/76  Pulse: 77   Temp: 97.5 F (36.4 C)   TempSrc: Oral   Resp: 16   Height: 5\' 4"  (1.626 m)   Weight: 179 lb (81.194 kg)   SpO2: 95%    EKG: SR, TWI,V1-V3, but appears unchanged from prior EKG - 03/17/13.  Results for orders placed in visit on 02/06/14  POCT CBC      Result Value Ref Range   WBC 4.3 (*) 4.6 - 10.2 K/uL   Lymph, poc 1.8  0.6 - 3.4   POC LYMPH PERCENT 42.9  10 - 50 %L   MID (cbc) 0.3  0 - 0.9   POC MID % 7.9  0 - 12 %M   POC Granulocyte 2.1  2 - 6.9   Granulocyte percent 49.2  37 - 80 %G   RBC 4.54  4.04 - 5.48 M/uL   Hemoglobin 14.3  12.2 - 16.2 g/dL   HCT, POC 16.1  09.6 - 47.9 %   MCV 98.7 (*) 80 - 97 fL   MCH, POC 31.5 (*) 27 - 31.2 pg   MCHC 31.9  31.8 -  35.4 g/dL   RDW, POC 04.5     Platelet Count, POC 318  142 - 424 K/uL   MPV 8.7  0 - 99.8 fL  POCT URINALYSIS DIPSTICK      Result Value Ref Range   Color, UA yellow     Clarity, UA clear     Glucose, UA neg     Bilirubin, UA neg     Ketones, UA neg     Spec Grav, UA 1.010     Blood, UA neg     pH, UA 7.0     Protein, UA neg     Urobilinogen, UA 0.2     Nitrite, UA neg     Leukocytes, UA Trace    POCT UA - MICROSCOPIC ONLY      Result Value Ref Range   WBC, Ur, HPF, POC 0-2     RBC, urine, microscopic 0-1     Bacteria, U Microscopic neg     Mucus, UA neg     Epithelial cells, urine per micros 0-3     Crystals, Ur, HPF, POC neg     Casts, Ur, LPF, POC neg     Yeast, UA neg        Assessment & Plan:   MARVIN MAENZA is a 58 y.o. female Dizziness, Nausea alone, Vertigo  Plan: POCT CBC, POCT urinalysis dipstick, POCT UA - Microscopic Only, EKG 12-Lead, meclizine (ANTIVERT) 25 MG tablet  - suspected vertigo/BPPV as associated with quick head mvmt and different x's than with subclavian sx's.  Trial of meclizine, but if not improving in next few days, or worse sooner - rtc or ER, as may need cardiology eval or further workup.    Urinary frequency - Plan: POCT CBC, POCT urinalysis dipstick, POCT UA - Microscopic Only, EKG 12-Lead. Reassuring U/A.  rtc if persists.   Other and unspecified hyperlipidemia - Plan: POCT CBC, POCT urinalysis dipstick, POCT UA -  Microscopic Only, EKG 12-Lead - labs pending, cont lipitor 40mg  qd   Hypothyroidism - Plan: TSH pending, cont same dose synthroid for now.     Meds ordered this encounter  Medications  . meclizine (ANTIVERT) 25 MG tablet    Sig: Take 1 tablet (25 mg total) by mouth 3 (three) times daily as needed for dizziness.    Dispense:  30 tablet    Refill:  0   Patient Instructions  You should receive a call or letter about your lab results within the next week to 10 days.   No change in your usual meds at this point.  You  can try the meclizine for the dizziness and nausea for possible vertigo, but if this is not improving the next few days - may need further evaluation with your cardiologist or return for recheck. Return to the clinic or go to the nearest emergency room if any of your symptoms worsen or new symptoms occur.  Vertigo Vertigo means you feel like you or your surroundings are moving when they are not. Vertigo can be dangerous if it occurs when you are at work, driving, or performing difficult activities.  CAUSES  Vertigo occurs when there is a conflict of signals sent to your brain from the visual and sensory systems in your body. There are many different causes of vertigo, including:  Infections, especially in the inner ear.  A bad reaction to a drug or misuse of alcohol and medicines.  Withdrawal from drugs or alcohol.  Rapidly changing positions, such as lying down or rolling over in bed.  A migraine headache.  Decreased blood flow to the brain.  Increased pressure in the brain from a head injury, infection, tumor, or bleeding. SYMPTOMS  You may feel as though the world is spinning around or you are falling to the ground. Because your balance is upset, vertigo can cause nausea and vomiting. You may have involuntary eye movements (nystagmus). DIAGNOSIS  Vertigo is usually diagnosed by physical exam. If the cause of your vertigo is unknown, your caregiver may perform imaging tests, such as an MRI scan (magnetic resonance imaging). TREATMENT  Most cases of vertigo resolve on their own, without treatment. Depending on the cause, your caregiver may prescribe certain medicines. If your vertigo is related to body position issues, your caregiver may recommend movements or procedures to correct the problem. In rare cases, if your vertigo is caused by certain inner ear problems, you may need surgery. HOME CARE INSTRUCTIONS   Follow your caregiver's instructions.  Avoid driving.  Avoid operating  heavy machinery.  Avoid performing any tasks that would be dangerous to you or others during a vertigo episode.  Tell your caregiver if you notice that certain medicines seem to be causing your vertigo. Some of the medicines used to treat vertigo episodes can actually make them worse in some people. SEEK IMMEDIATE MEDICAL CARE IF:   Your medicines do not relieve your vertigo or are making it worse.  You develop problems with talking, walking, weakness, or using your arms, hands, or legs.  You develop severe headaches.  Your nausea or vomiting continues or gets worse.  You develop visual changes.  A family member notices behavioral changes.  Your condition gets worse. MAKE SURE YOU:  Understand these instructions.  Will watch your condition.  Will get help right away if you are not doing well or get worse. Document Released: 07/12/2005 Document Revised: 12/25/2011 Document Reviewed: 04/20/2011 [facility] Patient Information 2014 [place], [place].  Nausea, Adult Nausea is the feeling that you have an upset stomach or have to vomit. Nausea by itself is not likely a serious concern, but it may be an early sign of more serious medical problems. As nausea gets worse, it can lead to vomiting. If vomiting develops, there is the risk of dehydration.  CAUSES   Viral infections.  Food poisoning.  Medicines.  Pregnancy.  Motion sickness.  Migraine headaches.  Emotional distress.  Severe pain from any source.  Alcohol intoxication. HOME CARE INSTRUCTIONS  Get plenty of rest.  Ask your caregiver about specific rehydration instructions.  Eat small amounts of food and sip liquids more often.  Take all medicines as told by your caregiver. SEEK MEDICAL CARE IF:  You have not improved after 2 days, or you get worse.  You have a headache. SEEK IMMEDIATE MEDICAL CARE IF:   You have a fever.  You faint.  You keep vomiting or have blood in your vomit.  You are  extremely weak or dehydrated.  You have dark or bloody stools.  You have severe chest or abdominal pain. MAKE SURE YOU:  Understand these instructions.  Will watch your condition.  Will get help right away if you are not doing well or get worse. Document Released: 11/09/2004 Document Revised: 06/26/2012 Document Reviewed: 06/14/2011 [facility] Patient Information 2014 [place], [place].     I personally performed the services described in this documentation, which was scribed in my presence. The recorded information has been reviewed and considered, and addended by me as needed.

## 2014-02-06 NOTE — Patient Instructions (Signed)
You should receive a call or letter about your lab results within the next week to 10 days.   No change in your usual meds at this point.  You can try the meclizine for the dizziness and nausea for possible vertigo, but if this is not improving the next few days - may need further evaluation with your cardiologist or return for recheck. Return to the clinic or go to the nearest emergency room if any of your symptoms worsen or new symptoms occur.  Vertigo Vertigo means you feel like you or your surroundings are moving when they are not. Vertigo can be dangerous if it occurs when you are at work, driving, or performing difficult activities.  CAUSES  Vertigo occurs when there is a conflict of signals sent to your brain from the visual and sensory systems in your body. There are many different causes of vertigo, including:  Infections, especially in the inner ear.  A bad reaction to a drug or misuse of alcohol and medicines.  Withdrawal from drugs or alcohol.  Rapidly changing positions, such as lying down or rolling over in bed.  A migraine headache.  Decreased blood flow to the brain.  Increased pressure in the brain from a head injury, infection, tumor, or bleeding. SYMPTOMS  You may feel as though the world is spinning around or you are falling to the ground. Because your balance is upset, vertigo can cause nausea and vomiting. You may have involuntary eye movements (nystagmus). DIAGNOSIS  Vertigo is usually diagnosed by physical exam. If the cause of your vertigo is unknown, your caregiver may perform imaging tests, such as an MRI scan (magnetic resonance imaging). TREATMENT  Most cases of vertigo resolve on their own, without treatment. Depending on the cause, your caregiver may prescribe certain medicines. If your vertigo is related to body position issues, your caregiver may recommend movements or procedures to correct the problem. In rare cases, if your vertigo is caused by certain  inner ear problems, you may need surgery. HOME CARE INSTRUCTIONS   Follow your caregiver's instructions.  Avoid driving.  Avoid operating heavy machinery.  Avoid performing any tasks that would be dangerous to you or others during a vertigo episode.  Tell your caregiver if you notice that certain medicines seem to be causing your vertigo. Some of the medicines used to treat vertigo episodes can actually make them worse in some people. SEEK IMMEDIATE MEDICAL CARE IF:   Your medicines do not relieve your vertigo or are making it worse.  You develop problems with talking, walking, weakness, or using your arms, hands, or legs.  You develop severe headaches.  Your nausea or vomiting continues or gets worse.  You develop visual changes.  A family member notices behavioral changes.  Your condition gets worse. MAKE SURE YOU:  Understand these instructions.  Will watch your condition.  Will get help right away if you are not doing well or get worse. Document Released: 07/12/2005 Document Revised: 12/25/2011 Document Reviewed: 04/20/2011 [facility] Patient Information 2014 [place], [place].    Nausea, Adult Nausea is the feeling that you have an upset stomach or have to vomit. Nausea by itself is not likely a serious concern, but it may be an early sign of more serious medical problems. As nausea gets worse, it can lead to vomiting. If vomiting develops, there is the risk of dehydration.  CAUSES   Viral infections.  Food poisoning.  Medicines.  Pregnancy.  Motion sickness.  Migraine headaches.  Emotional distress.  Severe pain from any source.  Alcohol intoxication. HOME CARE INSTRUCTIONS  Get plenty of rest.  Ask your caregiver about specific rehydration instructions.  Eat small amounts of food and sip liquids more often.  Take all medicines as told by your caregiver. SEEK MEDICAL CARE IF:  You have not improved after 2 days, or you get worse.  You have a  headache. SEEK IMMEDIATE MEDICAL CARE IF:   You have a fever.  You faint.  You keep vomiting or have blood in your vomit.  You are extremely weak or dehydrated.  You have dark or bloody stools.  You have severe chest or abdominal pain. MAKE SURE YOU:  Understand these instructions.  Will watch your condition.  Will get help right away if you are not doing well or get worse. Document Released: 11/09/2004 Document Revised: 06/26/2012 Document Reviewed: 06/14/2011 [facility] Patient Information 2014 [place]ExitCare, [place]LL[place].

## 2014-02-07 LAB — LIPID PANEL
CHOL/HDL RATIO: 2.1 ratio
CHOLESTEROL: 134 mg/dL (ref 0–200)
HDL: 63 mg/dL (ref >39)
LDL Cholesterol: 59 mg/dL (ref 0–99)
TRIGLYCERIDES: 58 mg/dL (ref <150)
VLDL: 12 mg/dL (ref 0–40)

## 2014-02-07 LAB — COMPLETE METABOLIC PANEL WITH GFR
ALK PHOS: 90 U/L (ref 39–117)
ALT: 44 U/L — ABNORMAL HIGH (ref 0–35)
AST: 20 U/L (ref 0–37)
Albumin: 4.6 g/dL (ref 3.5–5.2)
BUN: 11 mg/dL (ref 6–23)
CO2: 28 meq/L (ref 19–32)
CREATININE: 0.56 mg/dL (ref 0.50–1.10)
Calcium: 9.9 mg/dL (ref 8.4–10.5)
Chloride: 100 mEq/L (ref 96–112)
GLUCOSE: 88 mg/dL (ref 70–99)
Potassium: 4.6 mEq/L (ref 3.5–5.3)
Sodium: 139 mEq/L (ref 135–145)
TOTAL PROTEIN: 7.6 g/dL (ref 6.0–8.3)
Total Bilirubin: 0.4 mg/dL (ref 0.2–1.2)

## 2014-02-07 LAB — TSH: TSH: 0.856 u[IU]/mL (ref 0.350–4.500)

## 2014-02-09 ENCOUNTER — Other Ambulatory Visit: Payer: Self-pay | Admitting: *Deleted

## 2014-02-09 MED ORDER — ATORVASTATIN CALCIUM 40 MG PO TABS: 40.0000 mg | ORAL_TABLET | Freq: Every day | ORAL | Status: DC

## 2014-02-09 NOTE — Telephone Encounter (Signed)
Rx refill sent to patient pharmacy   

## 2014-03-10 ENCOUNTER — Other Ambulatory Visit: Payer: Self-pay

## 2014-03-10 MED ORDER — CLOPIDOGREL BISULFATE 75 MG PO TABS: 75.0000 mg | ORAL_TABLET | Freq: Every day | ORAL | Status: DC

## 2014-03-10 NOTE — Telephone Encounter (Signed)
Rx was sent to pharmacy electronically. 

## 2014-03-11 ENCOUNTER — Other Ambulatory Visit: Payer: Self-pay | Admitting: Family Medicine

## 2014-04-20 ENCOUNTER — Other Ambulatory Visit: Payer: Self-pay | Admitting: Family Medicine

## 2014-04-21 NOTE — Telephone Encounter (Signed)
Dr Neva SeatGreene, you saw this pt for some chronic issues in Apr, but don't see this med discussed. Do you want to give RFs until next 6 mos check up due, or RTC now?

## 2014-04-21 NOTE — Telephone Encounter (Signed)
Started in January - refilled for next few months, but needs OV to discuss prior to those running out. Thanks.

## 2014-05-03 ENCOUNTER — Other Ambulatory Visit: Payer: Self-pay | Admitting: Family Medicine

## 2014-05-10 ENCOUNTER — Other Ambulatory Visit: Payer: Self-pay | Admitting: Family Medicine

## 2014-07-31 ENCOUNTER — Other Ambulatory Visit: Payer: Self-pay

## 2014-09-08 ENCOUNTER — Encounter (HOSPITAL_COMMUNITY): Payer: Self-pay | Admitting: *Deleted

## 2014-09-08 ENCOUNTER — Other Ambulatory Visit (HOSPITAL_COMMUNITY): Payer: Self-pay | Admitting: Cardiovascular Disease

## 2014-09-08 DIAGNOSIS — I6529 Occlusion and stenosis of unspecified carotid artery: Secondary | ICD-10-CM

## 2014-09-24 ENCOUNTER — Encounter (HOSPITAL_COMMUNITY): Payer: Self-pay | Admitting: Cardiovascular Disease

## 2014-10-13 ENCOUNTER — Ambulatory Visit (HOSPITAL_COMMUNITY)
Admission: RE | Admit: 2014-10-13 | Discharge: 2014-10-13 | Disposition: A | Discharge status: Home / Self Care | Payer: BC Managed Care – PPO | Source: Ambulatory Visit | Attending: Cardiology | Admitting: Cardiology

## 2014-10-13 DIAGNOSIS — I739 Peripheral vascular disease, unspecified: Secondary | ICD-10-CM | POA: Diagnosis not present

## 2014-10-13 DIAGNOSIS — I6529 Occlusion and stenosis of unspecified carotid artery: Secondary | ICD-10-CM

## 2014-10-13 NOTE — Progress Notes (Signed)
Carotid Duplex Completed. °Brianna L Mazza,RVT °

## 2014-10-13 NOTE — Progress Notes (Signed)
Upper Extremity Arterial Duplex Completed. °Brianna L Mazza,RVT °

## 2014-10-14 ENCOUNTER — Ambulatory Visit (INDEPENDENT_AMBULATORY_CARE_PROVIDER_SITE_OTHER): Payer: BC Managed Care – PPO

## 2014-10-14 ENCOUNTER — Ambulatory Visit (INDEPENDENT_AMBULATORY_CARE_PROVIDER_SITE_OTHER): Payer: BC Managed Care – PPO | Admitting: Family Medicine

## 2014-10-14 VITALS — BP 140/80 | HR 73 | Temp 97.9°F | Resp 16 | Ht 65.25 in | Wt 188.4 lb

## 2014-10-14 DIAGNOSIS — R0602 Shortness of breath: Secondary | ICD-10-CM

## 2014-10-14 DIAGNOSIS — R059 Cough, unspecified: Secondary | ICD-10-CM

## 2014-10-14 DIAGNOSIS — R05 Cough: Secondary | ICD-10-CM

## 2014-10-14 DIAGNOSIS — J441 Chronic obstructive pulmonary disease with (acute) exacerbation: Secondary | ICD-10-CM

## 2014-10-14 MED ORDER — BENZONATATE 100 MG PO CAPS: 100.0000 mg | ORAL_CAPSULE | Freq: Three times a day (TID) | ORAL | Status: DC | PRN

## 2014-10-14 MED ORDER — PREDNISONE 20 MG PO TABS: 40.0000 mg | ORAL_TABLET | Freq: Every day | ORAL | Status: DC

## 2014-10-14 MED ORDER — AZITHROMYCIN 250 MG PO TABS: ORAL_TABLET | ORAL | Status: DC

## 2014-10-14 MED ORDER — ALBUTEROL SULFATE HFA 108 (90 BASE) MCG/ACT IN AERS: 2.0000 | INHALATION_SPRAY | RESPIRATORY_TRACT | Status: DC | PRN

## 2014-10-14 NOTE — Patient Instructions (Addendum)
Start the azithromycin, albuterol up to every 4 hours for wheezing or shortness of breath, but if you need this medicine more than twice per day or persistent use needed in next 2 days - start prednisone. Tessalon perles up to 3 times daily as needed for cough.  Return to the clinic or go to the nearest emergency room if any of your symptoms worsen or new symptoms occur.  Cough, Adult  A cough is a reflex that helps clear your throat and airways. It can help heal the body or may be a reaction to an irritated airway. A cough may only last 2 or 3 weeks (acute) or may last more than 8 weeks (chronic).  CAUSES Acute cough:  Viral or bacterial infections. Chronic cough:  Infections.  Allergies.  Asthma.  Post-nasal drip.  Smoking.  Heartburn or acid reflux.  Some medicines.  Chronic lung problems (COPD).  Cancer. SYMPTOMS   Cough.  Fever.  Chest pain.  Increased breathing rate.  High-pitched whistling sound when breathing (wheezing).  Colored mucus that you cough up (sputum). TREATMENT   A bacterial cough may be treated with antibiotic medicine.  A viral cough must run its course and will not respond to antibiotics.  Your caregiver may recommend other treatments if you have a chronic cough. HOME CARE INSTRUCTIONS   Only take over-the-counter or prescription medicines for pain, discomfort, or fever as directed by your caregiver. Use cough suppressants only as directed by your caregiver.  Use a cold steam vaporizer or humidifier in your bedroom or home to help loosen secretions.  Sleep in a semi-upright position if your cough is worse at night.  Rest as needed.  Stop smoking if you smoke. SEEK IMMEDIATE MEDICAL CARE IF:   You have pus in your sputum.  Your cough starts to worsen.  You cannot control your cough with suppressants and are losing sleep.  You begin coughing up blood.  You have difficulty breathing.  You develop pain which is getting worse or  is uncontrolled with medicine.  You have a fever. MAKE SURE YOU:   Understand these instructions.  Will watch your condition.  Will get help right away if you are not doing well or get worse. Document Released: 03/31/2011 Document Revised: 12/25/2011 Document Reviewed: 03/31/2011 [facility] OakExitCare Patient Information 2015 [place]ExitCare, [place]. This information is not intended to replace advice given to you by your health care provider. Make sure you discuss any questions you have with your health care provider.

## 2014-10-14 NOTE — Progress Notes (Addendum)
This chart was scribed for Meredith Staggers, MD by Tonye Royalty, ED Scribe. This patient was seen in room 1 and the patient's care was started at 2:38 PM.   Subjective:    Patient ID: Bethany Clarke, female    DOB: 1956/09/29, 58 y.o.   MRN: [id number]  Chief Complaint  Patient presents with  . Headache    x 4 days   . Cough    HPI  HPI Comments: Bethany Clarke is a 58 y.o. female with history of multiple medical problems including hypothyroidism, subclavian steal syndrome status post intervention by Dr. Allyson Sabal, carotid artery stenosis with regular doppler follow up, COPD with GOLDD severity based on evaluation by pulmonary in February of this year but decline any inhaled therapies in the past and not interested in treatment for COPD at that time. Of note, her O2 sat was 95% at last visit with me in April. She was last seen by me in April of this year. Last blood glucose in [place] was normal at 88.  Today, she complains of headache with onset 4 days ago. She states it starts on waking and is worse when she lays down. She also complains of coughing with onset yesterday with associated sinus pressure, chest congestion, and rhinorrhea. She states her coughing exacerbates her baseline SOB, but states her SOB is worse than baseline when she is not coughing as well. She states she has pain when she breathes in. She notes has a metallic bad taste in her mouth at waking. She states her cough is occasionally productive but is not sure what color her phlegm is. She denies fever, She states she has tried Tussin without improvement; she states she has not tried any other treatment. She states she has not used Albuterol recently but states she has used Albuterol inhaler in the ppast. She denies sick contacts. She denies recent prolonged travel and denies history of blood clots. She state she not smoked since April of last year.   PCP: [name], MD  Patient Active Problem List   Diagnosis Date  Noted  . Paresthesia 07/14/2013  . Bilateral foot pain 05/01/2013  . Subclavian artery stenosis, left   . Subclavian steal syndrome 03/16/2013  . Subclavian arterial stenosis 03/11/2013  . Carotid stenosis 03/11/2013  . Hyperlipidemia LDL goal < 70 03/11/2013  . GERD (gastroesophageal reflux disease)   . Hypothyroid   . Dizziness   . Syncope, near   . Heart burn   . HYPOTHYROIDISM 05/28/2007  . COPD 05/28/2007   Past Medical History  Diagnosis Date  . GERD (gastroesophageal reflux disease)   . Hypothyroid   . Dizziness   . Syncope, near   . Heart burn   . H. pylori infection   . Atherosclerosis 11/14/2005    carotid US - mild calcific non-stenotic plaque bilaterally  . Carotid bruit 02/05/2009    Doppler - R/L ICAs 0-49% diameter reductin (velocities suggest low-mid range); L subclavian 0-49%  diameter reduction  . Lipidemia   . Emphysema of lung   . Pneumonia ~ 2009  . Chronic bronchitis     "growing up; not anymore" (03/14/2013)  . Subclavian artery stenosis, left     subclavian steal physiology status post PTA and stenting   Past Surgical History  Procedure Laterality Date  . Abdominal hysterectomy  1999  . Subclavian artery stent Left 03/14/2013     for subclavian steal/claudication (03/14/2013)  . Dilation and curettage of uterus  1980's  .  Tubal ligation  1980's  . Breast biopsy Right 1990's    "benign" (03/14/2013)  . Cardiac catheterization  03/03/2013  . Left heart catheterization with coronary angiogram Bilateral 03/03/2013    Procedure: LEFT HEART CATHETERIZATION WITH CORONARY ANGIOGRAM;  Surgeon: Lennette Bihari, MD;  Location: [facility] CATH LAB;  Service: Cardiovascular;  Laterality: Bilateral;  . Bilateral upper extremity angiogram N/A 03/14/2013    Procedure: BILATERAL UPPER EXTREMITY ANGIOGRAM;  Surgeon: [name], MD;  Location[facility] CATH LAB;  Service: Cardiovascular;  Laterality: N/A;  . Percutaneous stent intervention  03/14/2013    Procedure: PERCUTANEOUS  STENT INTERVENTION;  Surgeon: [name], MD;  Location: [facility] CATH LAB;  Service: Cardiovascular;;   No Known Allergies Prior to Admission medications   Medication Sig Start Date End Date Taking? Authorizing Provider  aspirin EC 325 MG EC tablet Take 1 tablet (325 mg total) by mouth daily. 03/15/13  Yes Dwana Melena, PA-C  atorvastatin (LIPITOR) 40 MG tablet Take 1 tablet (40 mg total) by mouth daily. 02/09/14  Yes Lennette Bihari, MD  clopidogrel (PLAVIX) 75 MG tablet Take 1 tablet (75 mg total) by mouth daily with breakfast. 03/10/14  Yes Lennette Bihari, MD  SYNTHROID 100 MCG tablet TAKE 1 TABLET EVERY DAY BEFORE BREAKFAST   Yes [name], MD  clonazePAM (KLONOPIN) 0.5 MG tablet TAKE 1 TABLET BY MOUTH TWICE A DAY AS NEEDED FOR ANXIETY Patient not taking: Reported on 10/14/2014 10/28/13   [name], MD  FLUoxetine (PROZAC) 20 MG capsule Take 1 capsule (20 mg total) by mouth daily. Patient not taking: Reported on 10/14/2014    [name], MD  meclizine (ANTIVERT) 25 MG tablet Take 1 tablet (25 mg total) by mouth 3 (three) times daily as needed for dizziness. Patient not taking: Reported on 10/14/2014 02/06/14   [name], MD   History   Social History  . Marital Status: Married    Spouse Name: N/A    Number of Children: 3  . Years of Education: N/A   Occupational History  . 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 Smoking status: Former Smoker -- 0.50 packs/day for 40 years    Types: Cigarettes    Quit date: 02/11/2013  . Smokeless tobacco: Never Used  . Alcohol Use: No  . Drug Use: No  . Sexual Activity: Yes     Comment: number of sex partners in the last 12 months  1   Other Topics Concern  . Not on file   Social History Narrative   Daily caffeine       Review of Systems  Constitutional: Negative for fever.  HENT: Positive for congestion, rhinorrhea and sinus pressure.   Respiratory: Positive for cough and shortness of breath.     Neurological: Positive for headaches.       Objective:   Physical Exam  Constitutional: She is oriented to person, place, and time. She appears well-developed and well-nourished. No distress.  HENT:  Head: Normocephalic and atraumatic.  Right Ear: Hearing, tympanic membrane, external ear and ear canal normal.  Left Ear: Hearing, tympanic membrane, external ear and ear canal normal.  Nose: Nose normal.  Mouth/Throat: Oropharynx is clear and moist. No oropharyngeal exudate.  Eyes: Conjunctivae and EOM are normal. Pupils are equal, round, and reactive to light.  Cardiovascular: Normal rate, regular rhythm, normal heart sounds and intact distal pulses.   No murmur heard. Pulmonary/Chest: Effort normal and breath sounds  normal. No respiratory distress. She has no wheezes. She has no rhonchi.  No audible wheeze but does have some hoarse breath sounds worse on right than on left Slightly dyspneic breath sounds  Musculoskeletal:  No calf swelling or pain  Neurological: She is alert and oriented to person, place, and time.  Skin: Skin is warm and dry. No rash noted.  Psychiatric: She has a normal mood and affect. Her behavior is normal.  Vitals reviewed.   Filed Vitals:   10/14/14 1351  BP: 140/80  Pulse: 73  Temp: 97.9 F (36.6 C)  TempSrc: Oral  Resp: 16  Height: 5' 5.25" (1.657 m)  Weight: 188 lb 6.4 oz (85.458 kg)  SpO2: 95%    Ambulatory pulse ox: low of 93%, high of 95%  [facility] (PRIMARY) x-ray report read by Dr. Neva SeatGreene: chest x-ray: few increased marking inferiorly vs scarring, no discrete infiltrate     Assessment & Plan:   Bethany LimeKatrina Y Clarke is a 58 y.o. female Cough - Plan: DG Chest 2 View, azithromycin (ZITHROMAX) 250 MG tablet, benzonatate (TESSALON) 100 MG capsule, predniSONE (DELTASONE) 20 MG tablet  Shortness of breath - Plan: DG Chest 2 View, azithromycin (ZITHROMAX) 250 MG tablet, benzonatate (TESSALON) 100 MG capsule, predniSONE (DELTASONE) 20 MG  tablet  COPD exacerbation - Plan: azithromycin (ZITHROMAX) 250 MG tablet, benzonatate (TESSALON) 100 MG capsule, predniSONE (DELTASONE) 20 MG tablet   Possible initial viral URI, with settling into chest - underlying COPD - early COPD exacerbation.  Start albuterol inhaler if needed for wheeze/dyspnea (declines other treatments for COPD and declined trial of neb in office). Start Zpak. If persistent/frequent use of albuterol as below - start prednisone. SED, rtc precautions given.    Meds ordered this encounter  Medications  . azithromycin (ZITHROMAX) 250 MG tablet    Sig: Take 2 pills by mouth on day 1, then 1 pill by mouth per day on days 2 through 5.    Dispense:  6 tablet    Refill:  0  . benzonatate (TESSALON) 100 MG capsule    Sig: Take 1 capsule (100 mg total) by mouth 3 (three) times daily as needed for cough.    Dispense:  20 capsule    Refill:  0  . predniSONE (DELTASONE) 20 MG tablet    Sig: Take 2 tablets (40 mg total) by mouth daily with breakfast.    Dispense:  10 tablet    Refill:  0   Patient Instructions  Start the azithromycin, albuterol up to every 4 hours for wheezing or shortness of breath, but if you need this medicine more than twice per day or persistent use needed in next 2 days - start prednisone. Tessalon perles up to 3 times daily as needed for cough.  Return to the clinic or go to the nearest emergency room if any of your symptoms worsen or new symptoms occur.  Cough, Adult  A cough is a reflex that helps clear your throat and airways. It can help heal the body or may be a reaction to an irritated airway. A cough may only last 2 or 3 weeks (acute) or may last more than 8 weeks (chronic).  CAUSES Acute cough:  Viral or bacterial infections. Chronic cough:  Infections.  Allergies.  Asthma.  Post-nasal drip.  Smoking.  Heartburn or acid reflux.  Some medicines.  Chronic lung problems (COPD).  Cancer. SYMPTOMS   Cough.  Fever.  Chest  pain.  Increased breathing rate.  High-pitched whistling sound when  breathing (wheezing).  Colored mucus that you cough up (sputum). TREATMENT   A bacterial cough may be treated with antibiotic medicine.  A viral cough must run its course and will not respond to antibiotics.  Your caregiver may recommend other treatments if you have a chronic cough. HOME CARE INSTRUCTIONS   Only take over-the-counter or prescription medicines for pain, discomfort, or fever as directed by your caregiver. Use cough suppressants only as directed by your caregiver.  Use a cold steam vaporizer or humidifier in your bedroom or home to help loosen secretions.  Sleep in a semi-upright position if your cough is worse at night.  Rest as needed.  Stop smoking if you smoke. SEEK IMMEDIATE MEDICAL CARE IF:   You have pus in your sputum.  Your cough starts to worsen.  You cannot control your cough with suppressants and are losing sleep.  You begin coughing up blood.  You have difficulty breathing.  You develop pain which is getting worse or is uncontrolled with medicine.  You have a fever. MAKE SURE YOU:   Understand these instructions.  Will watch your condition.  Will get help right away if you are not doing well or get worse. Document Released: 03/31/2011 Document Revised: 12/25/2011 Document Reviewed: 03/31/2011 [facility] Campus[facility] Patient Information 2015 [place], [place]. This information is not intended to replace advice given to you by your health care provider. Make sure you discuss any questions you have with your health care provider.     I personally performed the services described in this documentation, which was scribed in my presence. The recorded information has been reviewed and considered, and addended by me as needed.

## 2014-10-23 ENCOUNTER — Telehealth: Payer: Self-pay | Admitting: Cardiovascular Disease

## 2014-10-23 ENCOUNTER — Encounter: Payer: Self-pay | Admitting: Cardiovascular Disease

## 2014-10-23 NOTE — Telephone Encounter (Signed)
Results read to patient. She voiced understanding. 

## 2014-10-23 NOTE — Telephone Encounter (Signed)
Pt would like doppler results from 10-13-14 please.

## 2014-11-11 ENCOUNTER — Other Ambulatory Visit: Payer: Self-pay | Admitting: Family Medicine

## 2014-12-12 ENCOUNTER — Other Ambulatory Visit: Payer: Self-pay | Admitting: Physician Assistant

## 2014-12-15 ENCOUNTER — Telehealth: Payer: Self-pay

## 2014-12-15 MED ORDER — LEVOTHYROXINE SODIUM 100 MCG PO TABS: 100.0000 ug | ORAL_TABLET | Freq: Every day | ORAL | Status: DC

## 2014-12-15 NOTE — Telephone Encounter (Signed)
Yes! Signed.

## 2014-12-15 NOTE — Telephone Encounter (Signed)
Dr. Neva SeatGreene, Rx pended. Is this ok?

## 2014-12-15 NOTE — Telephone Encounter (Signed)
Spoke with pt, advised Rx sent in. 

## 2014-12-15 NOTE — Telephone Encounter (Addendum)
Pt states she have made an appt for a CPE with Dr.Greene, but in the meantime is out of her synthroid medicine and would like to have some until her pe which is in June. It is her SYNTHROID 100 mgs. Please call pt at [phone]   [facility] 

## 2014-12-25 ENCOUNTER — Other Ambulatory Visit: Payer: Self-pay | Admitting: Cardiovascular Disease

## 2014-12-25 NOTE — Telephone Encounter (Signed)
Rx has been sent to the pharmacy electronically. ° °

## 2015-01-29 ENCOUNTER — Other Ambulatory Visit: Payer: Self-pay | Admitting: Cardiovascular Disease

## 2015-03-01 ENCOUNTER — Other Ambulatory Visit: Payer: Self-pay | Admitting: Cardiovascular Disease

## 2015-03-02 NOTE — Telephone Encounter (Signed)
Rx has been sent to the pharmacy electronically. ° °

## 2015-03-29 ENCOUNTER — Encounter: Payer: Self-pay | Admitting: *Deleted

## 2015-03-31 ENCOUNTER — Other Ambulatory Visit: Payer: Self-pay | Admitting: Family Medicine

## 2015-04-12 ENCOUNTER — Ambulatory Visit (INDEPENDENT_AMBULATORY_CARE_PROVIDER_SITE_OTHER): Payer: BLUE CROSS/BLUE SHIELD | Admitting: Family Medicine

## 2015-04-12 ENCOUNTER — Encounter: Payer: Self-pay | Admitting: Family Medicine

## 2015-04-12 ENCOUNTER — Ambulatory Visit (INDEPENDENT_AMBULATORY_CARE_PROVIDER_SITE_OTHER): Payer: BLUE CROSS/BLUE SHIELD

## 2015-04-12 VITALS — BP 140/90 | HR 72 | Temp 97.8°F | Resp 16 | Ht 64.5 in | Wt 186.6 lb

## 2015-04-12 DIAGNOSIS — R079 Chest pain, unspecified: Secondary | ICD-10-CM

## 2015-04-12 DIAGNOSIS — E039 Hypothyroidism, unspecified: Secondary | ICD-10-CM

## 2015-04-12 DIAGNOSIS — E785 Hyperlipidemia, unspecified: Secondary | ICD-10-CM

## 2015-04-12 DIAGNOSIS — B001 Herpesviral vesicular dermatitis: Secondary | ICD-10-CM | POA: Diagnosis not present

## 2015-04-12 DIAGNOSIS — J449 Chronic obstructive pulmonary disease, unspecified: Secondary | ICD-10-CM | POA: Diagnosis not present

## 2015-04-12 DIAGNOSIS — Z0000 Encounter for general adult medical examination without abnormal findings: Secondary | ICD-10-CM | POA: Diagnosis not present

## 2015-04-12 LAB — COMPLETE METABOLIC PANEL WITH GFR
ALBUMIN: 4.5 g/dL (ref 3.5–5.2)
ALK PHOS: 76 U/L (ref 39–117)
ALT: 39 U/L — ABNORMAL HIGH (ref 0–35)
AST: 20 U/L (ref 0–37)
BUN: 11 mg/dL (ref 6–23)
CALCIUM: 9.8 mg/dL (ref 8.4–10.5)
CHLORIDE: 100 meq/L (ref 96–112)
CO2: 26 mEq/L (ref 19–32)
Creat: 0.64 mg/dL (ref 0.50–1.10)
GFR, Est African American: >89 mL/min
GFR, Est Non African American: >89 mL/min
Glucose, Bld: 88 mg/dL (ref 70–99)
Potassium: 4.3 mEq/L (ref 3.5–5.3)
Sodium: 138 mEq/L (ref 135–145)
Total Bilirubin: 0.3 mg/dL (ref 0.2–1.2)
Total Protein: 7.6 g/dL (ref 6.0–8.3)

## 2015-04-12 LAB — LIPID PANEL
Cholesterol: 147 mg/dL (ref 0–200)
HDL: 57 mg/dL (ref >=46)
LDL CALC: 42 mg/dL (ref 0–99)
Total CHOL/HDL Ratio: 2.6 Ratio
Triglycerides: 238 mg/dL — ABNORMAL HIGH (ref <150)
VLDL: 48 mg/dL — ABNORMAL HIGH (ref 0–40)

## 2015-04-12 LAB — TSH: TSH: 1.707 u[IU]/mL (ref 0.350–4.500)

## 2015-04-12 MED ORDER — VALACYCLOVIR HCL 500 MG PO TABS: 500.0000 mg | ORAL_TABLET | Freq: Two times a day (BID) | ORAL | Status: DC

## 2015-04-12 MED ORDER — ATORVASTATIN CALCIUM 40 MG PO TABS: ORAL_TABLET | ORAL | Status: DC

## 2015-04-12 MED ORDER — CLOPIDOGREL BISULFATE 75 MG PO TABS: 75.0000 mg | ORAL_TABLET | Freq: Every day | ORAL | Status: DC

## 2015-04-12 MED ORDER — LEVOTHYROXINE SODIUM 100 MCG PO TABS: ORAL_TABLET | ORAL | Status: DC

## 2015-04-12 NOTE — Patient Instructions (Addendum)
You should receive a call or letter about your lab results within the next week to 10 days.  Ok to take Valtrex 500mg  each day to lessen cold sore viruses, but you could also take it just with outbreak (4 pills at onset of symptoms, then repeat dose of 4 pills in 12 hours once).   Call your cardiologist to obtain appointment in next 2 weeks as possible to discuss your recent chest pain to determine if other evaluation needed. Although your EKG appears abnormal, it does not look much different from your prior ekg.  If pain returns - go to emergency room or call 911. If you are seen by cardiologist and not though to be pain due to cardiac reasons - return to discuss other possible causes and next step in workup. Your cardiologist will usually need to refill your plavix, but I gave you enough for next month or two.   Walking for exercise ok, but avoid exertional exercise until evaluated by your cardiologist. If your COPD is preventing you from walking, or you would like to meet with pulmonologist again to discuss possible treatments please let me know.   Schedule mammogram as planned, and other health maintenance as discussed below. Let me know if you have any questions on these topics.   I would encourage a printed living will - if you obtain one of these, bring a copy for us to place in your chart if you can.   Keeping You Healthy  Get These Tests  Blood Pressure- Have your blood pressure checked by your healthcare provider at least once a year.  Normal blood pressure is 120/80.  Weight- Have your body mass index (BMI) calculated to screen for obesity.  BMI is a measure of body fat based on height and weight.  You can calculate your own BMI at https://www.west-esparza.com/www.[email or web address]/bmi/  Cholesterol- Have your cholesterol checked every year.  Diabetes- Have your blood sugar checked every year if you have high blood pressure, high cholesterol, a family history of diabetes or if you are overweight.  Pap Test -  Have a pap test every 1 to 5 years if you have been sexually active.  If you are older than 65 and recent pap tests have been normal you may not need additional pap tests.  In addition, if you have had a hysterectomy  for benign disease additional pap tests are not necessary.  Mammogram-Yearly mammograms are essential for early detection of breast cancer  Screening for Colon Cancer- Colonoscopy starting at age 59. Screening may begin sooner depending on your family history and other health conditions.  Follow up colonoscopy as directed by your Gastroenterologist.  Screening for Osteoporosis- Screening begins at age 59 with bone density scanning, sooner if you are at higher risk for developing Osteoporosis.  Get these medicines  Calcium with Vitamin D- Your body requires 1200-1500 mg of Calcium a day and [phone] IU of Vitamin D a day.  You can only absorb 500 mg of Calcium at a time therefore Calcium must be taken in 2 or 3 separate doses throughout the day.  Hormones- Hormone therapy has been associated with increased risk for certain cancers and heart disease.  Talk to your healthcare provider about if you need relief from menopausal symptoms.  Aspirin- Ask your healthcare provider about taking Aspirin to prevent Heart Disease and Stroke.  Get these Immuniztions  Flu shot- Every fall  Pneumonia shot- Once after the age of 59; if you are younger ask your healthcare provider  if you need a pneumonia shot.  Tetanus- Every ten years.  Zostavax- Once after the age of 22 to prevent shingles.  Take these steps  Don't smoke- Your healthcare provider can help you quit. For tips on how to quit, ask your healthcare provider or go to www.smokefree.gov or call 1-800 QUIT-NOW.  Be physically active- Exercise 5 days a week for a minimum of 30 minutes.  If you are not already physically active, start slow and gradually work up to 30 minutes of moderate physical activity.  Try walking, dancing, bike  riding, swimming, etc.  Eat a healthy diet- Eat a variety of healthy foods such as fruits, vegetables, whole grains, low fat milk, low fat cheeses, yogurt, lean meats, chicken, fish, eggs, dried beans, tofu, etc.  For more information go to www.thenutritionsource.org  Dental visit- Brush and floss teeth twice daily; visit your dentist twice a year.  Eye exam- Visit your Optometrist or Ophthalmologist yearly.  Drink alcohol in moderation- Limit alcohol intake to one drink or less a day.  Never drink and drive.  Depression- Your emotional health is as important as your physical health.  If you're feeling down or losing interest in things you normally enjoy, please talk to your healthcare provider.  Seat Belts- can save your life; always wear one  Smoke/Carbon Monoxide detectors- These detectors need to be installed on the appropriate level of your home.  Replace batteries at least once a year.  Violence- If anyone is threatening or hurting you, please tell your healthcare provider.  Living Will/ Health care power of attorney- Discuss with your healthcare provider and family.

## 2015-04-12 NOTE — Progress Notes (Addendum)
Subjective:  This chart was scribed for Bethany Flood, MD by [name], at [facility].  This patient was seen in room 21 and the patient's care was started at 2:44 PM.     Patient ID: Bethany Clarke, female    DOB: 12/22/1955, 59 y.o.   MRN: [id number]  HPI  HPI Comments: Bethany Clarke is a 59 y.o. female who presents to the [facility] for an annual exam/mediaction refill. Patient did not go back to her gastroenterologist because she did not enjoy her experience with him before her endoscopy.     Chest Pain: Patient notes of a severe acute onset episode of stabbing chest pain onset three weeks ago when she was watching a soccer game. She felt  her jaw locked for 20 minutes at that time. She has been having intermittent pains which have not been as severe for the past two years. She did not have any chest pain after the episode three weeks ago. She states that she is "always short of breath".  She is willing to see Dr. Tresa Endo and Dr. Allyson Sabal regarding her pain. No belching no burning or heartburn.   She had a cardiac catheter Mar 03 2014 that had normal LV function and normal coronary arteries.    Cancer Screening:  colonoscopy may 2010- repeat in 5-10 years for single polyp. Pathology on that polyp was hyperplastic polyp so anticipate 10 year follow-up.  Breast cancer screening- she is over-due for a mammogram. Last one in our system was negative but in 2010. --- she is willing to schedule her own mammogram.  Cervical cancer screening---  Patient has a gynecologist and last had her screening 2 years ago, she was told to repeat every 4 years as there were no concerns.    Immunizations Immunization History  Administered Date(s) Administered  . Pneumococcal Polysaccharide-23 10/17/2003  . Td 10/16/2000  She had a T-dap in may 2008.    Depression screening:  Depression screen [facility] 2/9 04/12/2015  Decreased Interest 0  Down,  Depressed, Hopeless 0  PHQ - 2 Score 0    Anxiety:  History of anxiety in the past. Discussed SSRI and counseling in the past which was declined.  Has been managed with Klonopin BID prn. Last refill of this medication was in 2014. ----  She has not needed any medication recently and is still not willing to see a counselor.    Exercise: Patient does not exercise and is aware that she needs to.  She has a treadmill in her home.    Dentist: She last saw her dentist 2 years ago.  She will be going again in September.    Ophthamologist:  Patient will go to purchase new glasses and plans on scheduling an appointment soon.    COPD: Gold category D based on evaluation with pulmonary in the past.  Declined daily treatments for COPD. She was seen for an exasperation in December 2015, treated with prednisone, z pack, tessalon pearls also given albuterol inhaler if needed but declined other treatments for COPD or nebulizer in office for that day. ----  Patient has seen a pulmonologist regarding her COPD and does not like using her inhaler. She is no longer smoking and has not for the last 2 years.    Hyperlipidemia:  she takes Lipitor 40 mg QD Lab Results  Component Value Date   CHOL 134 02/06/2014   HDL 63 02/06/2014   LDLCALC 59  02/06/2014   TRIG 58 02/06/2014   CHOLHDL 2.1 02/06/2014  --- She is complaint with her Lipitor     Hypothyroidism:  Lab Results  Component Value Date   TSH 0.856 02/06/2014  She takes synthroid 100 mcg qd ---- She is complaint with her synthroid.     History of subclavian steel:  Status post subclavian artery intervention by dr berry in may 2015. Plan on carotid dopplers and upper extremity dopplers annually. She takes Plavix 75 mg QD and needs a refill. Most recent doppler in December 2015 was stable. Patient last saw Dr. Allyson Sabal over a year ago.     History of fever blisters: She has used Valtrex (not her prescription) and would like her own  prescription today. Her blisters have been the worst this year and she did not get them as often in the past.  She is willing to take Valtrex daily for her blisters.    Advanced Directive/ Living Will: Patient does not have advanced directive or living will and does not want any information today.      Patient Active Problem List   Diagnosis Date Noted  . Paresthesia 07/14/2013  . Bilateral foot pain 05/01/2013  . Subclavian artery stenosis, left   . Subclavian steal syndrome 03/16/2013  . Subclavian arterial stenosis 03/11/2013  . Carotid stenosis 03/11/2013  . Hyperlipidemia LDL goal < 70 03/11/2013  . GERD (gastroesophageal reflux disease)   . Hypothyroid   . Dizziness   . Syncope, near   . Heart burn   . HYPOTHYROIDISM 05/28/2007  . COPD 05/28/2007   Past Medical History  Diagnosis Date  . GERD (gastroesophageal reflux disease)   . Hypothyroid   . Dizziness   . Syncope, near   . Heart burn   . H. pylori infection   . Atherosclerosis 11/14/2005    carotid US - mild calcific non-stenotic plaque bilaterally  . Carotid bruit 02/05/2009    Doppler - R/L ICAs 0-49% diameter reductin (velocities suggest low-mid range); L subclavian 0-49%  diameter reduction  . Lipidemia   . Emphysema of lung   . Pneumonia ~ 2009  . Chronic bronchitis     "growing up; not anymore" (03/14/2013)  . Subclavian artery stenosis, left     subclavian steal physiology status post PTA and stenting   Past Surgical History  Procedure Laterality Date  . Abdominal hysterectomy  1999  . Subclavian artery stent Left 03/14/2013     for subclavian steal/claudication (03/14/2013)  . Dilation and curettage of uterus  1980's  . Tubal ligation  1980's  . Breast biopsy Right 1990's    "benign" (03/14/2013)  . Cardiac catheterization  03/03/2013  . Left heart catheterization with coronary angiogram Bilateral 03/03/2013    Procedure: LEFT HEART CATHETERIZATION WITH CORONARY ANGIOGRAM;  Surgeon: Lennette Bihari,  MD;  Location: [facility] CATH LAB;  Service: Cardiovascular;  Laterality: Bilateral;  . Bilateral upper extremity angiogram N/A 03/14/2013    Procedure: BILATERAL UPPER EXTREMITY ANGIOGRAM;  Surgeon: [name], MD;  Location: [facility] CATH LAB;  Service: Cardiovascular;  Laterality: N/A;  . Percutaneous stent intervention  03/14/2013    Procedure: PERCUTANEOUS STENT INTERVENTION;  Surgeon: [name], MD;  Location: [facility] CATH LAB;  Service: Cardiovascular;;   No Known Allergies Prior to Admission medications   Medication Sig Start Date End Date Taking? Authorizing Provider  aspirin EC 325 MG EC tablet Take 1 tablet (325 mg total) by mouth daily. 03/15/13  Yes Dwana Melena, PA-C  atorvastatin (LIPITOR) 40 MG tablet TAKE 1 TABLET (40 MG TOTAL) BY MOUTH DAILY. 03/02/15  Yes Runell GessJonathan J Berry, MD  clopidogrel (PLAVIX) 75 MG tablet TAKE 1 TABLET BY MOUTH EVERY DAY 02/01/15  Yes Lennette Biharihomas A Kelly, MD  levothyroxine (SYNTHROID) 100 MCG tablet TAKE 1 TABLET BY MOUTH DAILY BEFORE BREAKFAST. 03/31/15  Yes Morrell RiddleSarah L Weber, PA-C  albuterol (PROVENTIL HFA;VENTOLIN HFA) 108 (90 BASE) MCG/ACT inhaler Inhale 2 puffs into the lungs every 4 (four) hours as needed for wheezing or shortness of breath. Patient not taking: Reported on 04/12/2015 10/14/14   Bethany FloodJeffrey R Wayland Baik, MD   History   Social History  . Marital Status: Married    Spouse Name: N/A  . Number of Children: 3  . Years of Education: N/A   Occupational History  . 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 Smoking status: Former Smoker -- 0.50 packs/day for 40 years    Types: Cigarettes    Quit date: 02/11/2013  . Smokeless tobacco: Never Used  . Alcohol Use: No  . Drug Use: No  . Sexual Activity: Yes     Comment: number of sex partners in the last 12 months  1   Other Topics Concern  . Not on file   Social History Narrative   Daily caffeine     Review of Systems  Constitutional: Negative for fever and chills.  HENT: Positive for mouth  sores.   Eyes: Negative for pain, redness and itching.  Respiratory: Positive for shortness of breath.   Cardiovascular: Positive for chest pain.  Gastrointestinal: Negative for nausea and vomiting.      Objective:   Physical Exam  HENT:  Small ulcerative appearing area on her left inside of her lower lip.   Neck: Normal range of motion.  Cardiovascular: Normal rate, regular rhythm and normal heart sounds.  Exam reveals no gallop and no friction rub.   No murmur heard. Pulmonary/Chest: Effort normal and breath sounds normal. No respiratory distress. She has no wheezes. She has no rales.  Refused/declined breast exam.   Musculoskeletal:  No calf swelling, non tenderness. Negative homans.  c spine full range of motion does not reproduce chest pain.    Filed Vitals:   04/12/15 1423  BP: 140/90  Pulse: 72  Temp: 97.8 F (36.6 C)  TempSrc: Oral  Resp: 16  Height: 5' 4.5" (1.638 m)  Weight: 186 lb 9.6 oz (84.641 kg)  SpO2: 95%    Visual Acuity Screening   Right eye Left eye Both eyes  Without correction:     With correction: 20/40 20/30 20/20    EKG: non specific ST depression.  Inferior laterally, As well as t wave inversion v1-v4.  No apparent significant change from April 2015.       [facility] (PRIMARY) x-ray report read by Dr. Neva SeatGreene:  Chest: Possible scarring or chronic changes. No apparent nodules or mass, no cardiomegaly.    Assessment & Plan:   Bethany LimeKatrina Y Geister is a 59 y.o. female Annual physical exam  --anticipatory guidance as below in AVS, screening labs above. Health maintenance items as above in HPI discussed/recommended as applicable.   Chronic obstructive pulmonary disease, unspecified COPD, unspecified chronic bronchitis type  - again declined any inhaled treatments. Offered to refer to pulmonary again to discuss medication options. Declined at this visit.  Chronic dyspnea on exertion most likely due to her untreated COPD  Chest pain, unspecified chest pain  type - Plan: EKG 12-Lead, DG Chest  2 View  -Astigmatic in office, but with recent atypical pain and risk factors with prior subclavian steal recommend she follow back up with her cardiologist. She did have a cardiac catheterization prior that  looked okay but advised other causes of pain may still be present and should follow-up with cardiology as planned. She plans on calling office to help schedule that appointment.   Fever blister - Plan: valACYclovir (VALTREX) 500 MG tablet  -Discussed daily dosing to lessen recurrence and decreased asymptomatic shedding of  Virus.  If flare, increase to 4 pills once repeat dose of 4 pills in 12 hours  Hyperlipidemia - Plan: Lipid panel, COMPLETE METABOLIC PANEL WITH GFR  Lipids pending, continue Lipitor same dose  Hypothyroidism, unspecified hypothyroidism type - Plan: TSH  -TSH pending, continue Synthroid at 100 MCG daily.  Return to clinic, ER precautions discussed  Meds ordered this encounter  Medications  . valACYclovir (VALTREX) 500 MG tablet    Sig: Take 1 tablet (500 mg total) by mouth 2 (two) times daily.    Dispense:  90 tablet    Refill:  3  . levothyroxine (SYNTHROID) 100 MCG tablet    Sig: TAKE 1 TABLET BY MOUTH DAILY BEFORE BREAKFAST.    Dispense:  90 tablet    Refill:  1  . clopidogrel (PLAVIX) 75 MG tablet    Sig: Take 1 tablet (75 mg total) by mouth daily.    Dispense:  30 tablet    Refill:  1  . atorvastatin (LIPITOR) 40 MG tablet    Sig: TAKE 1 TABLET (40 MG TOTAL) BY MOUTH DAILY.    Dispense:  90 tablet    Refill:  1   Patient Instructions  You should receive a call or letter about your lab results within the next week to 10 days.  Ok to take Valtrex 500mg  each day to lessen cold sore viruses, but you could also take it just with outbreak (4 pills at onset of symptoms, then repeat dose of 4 pills in 12 hours once).   Call your cardiologist to obtain appointment in next 2 weeks as possible to discuss your recent chest pain  to determine if other evaluation needed. Although your EKG appears abnormal, it does not look much different from your prior ekg.  If pain returns - go to emergency room or call 911. If you are seen by cardiologist and not though to be pain due to cardiac reasons - return to discuss other possible causes and next step in workup. Your cardiologist will usually need to refill your plavix, but I gave you enough for next month or two.   Walking for exercise ok, but avoid exertional exercise until evaluated by your cardiologist. If your COPD is preventing you from walking, or you would like to meet with pulmonologist again to discuss possible treatments please let me know.   Schedule mammogram as planned, and other health maintenance as discussed below. Let me know if you have any questions on these topics.   I would encourage a printed living will - if you obtain one of these, bring a copy for [place] to place in your chart if you can.   Keeping You Healthy  Get These Tests  Blood Pressure- Have your blood pressure checked by your healthcare provider at least once a year.  Normal blood pressure is 120/80.  Weight- Have your body mass index (BMI) calculated to screen for obesity.  BMI is a measure of body fat based on height and  weight.  You can calculate your own BMI at https://www.west-esparza.com/  Cholesterol- Have your cholesterol checked every year.  Diabetes- Have your blood sugar checked every year if you have high blood pressure, high cholesterol, a family history of diabetes or if you are overweight.  Pap Test - Have a pap test every 1 to 5 years if you have been sexually active.  If you are older than 65 and recent pap tests have been normal you may not need additional pap tests.  In addition, if you have had a hysterectomy  for benign disease additional pap tests are not necessary.  Mammogram-Yearly mammograms are essential for early detection of breast cancer  Screening for Colon Cancer-  Colonoscopy starting at age 12. Screening may begin sooner depending on your family history and other health conditions.  Follow up colonoscopy as directed by your Gastroenterologist.  Screening for Osteoporosis- Screening begins at age 13 with bone density scanning, sooner if you are at higher risk for developing Osteoporosis.  Get these medicines  Calcium with Vitamin D- Your body requires 1200-1500 mg of Calcium a day and [id number] IU of Vitamin D a day.  You can only absorb 500 mg of Calcium at a time therefore Calcium must be taken in 2 or 3 separate doses throughout the day.  Hormones- Hormone therapy has been associated with increased risk for certain cancers and heart disease.  Talk to your healthcare provider about if you need relief from menopausal symptoms.  Aspirin- Ask your healthcare provider about taking Aspirin to prevent Heart Disease and Stroke.  Get these Immuniztions  Flu shot- Every fall  Pneumonia shot- Once after the age of 86; if you are younger ask your healthcare provider if you need a pneumonia shot.  Tetanus- Every ten years.  Zostavax- Once after the age of 52 to prevent shingles.  Take these steps  Don't smoke- Your healthcare provider can help you quit. For tips on how to quit, ask your healthcare provider or go to www.smokefree.gov or call 1-800 QUIT-NOW.  Be physically active- Exercise 5 days a week for a minimum of 30 minutes.  If you are not already physically active, start slow and gradually work up to 30 minutes of moderate physical activity.  Try walking, dancing, bike riding, swimming, etc.  Eat a healthy diet- Eat a variety of healthy foods such as fruits, vegetables, whole grains, low fat milk, low fat cheeses, yogurt, lean meats, chicken, fish, eggs, dried beans, tofu, etc.  For more information go to www.thenutritionsource.org  Dental visit- Brush and floss teeth twice daily; visit your dentist twice a year.  Eye exam- Visit your Optometrist  or Ophthalmologist yearly.  Drink alcohol in moderation- Limit alcohol intake to one drink or less a day.  Never drink and drive.  Depression- Your emotional health is as important as your physical health.  If you're feeling down or losing interest in things you normally enjoy, please talk to your healthcare provider.  Seat Belts- can save your life; always wear one  Smoke/Carbon Monoxide detectors- These detectors need to be installed on the appropriate level of your home.  Replace batteries at least once a year.  Violence- If anyone is threatening or hurting you, please tell your healthcare provider.  Living Will/ Health care power of attorney- Discuss with your healthcare provider and family.

## 2015-07-03 ENCOUNTER — Other Ambulatory Visit: Payer: Self-pay | Admitting: Family Medicine

## 2015-07-13 ENCOUNTER — Ambulatory Visit (INDEPENDENT_AMBULATORY_CARE_PROVIDER_SITE_OTHER): Payer: BLUE CROSS/BLUE SHIELD | Admitting: Cardiovascular Disease

## 2015-07-13 VITALS — BP 128/84 | HR 64 | Ht 65.0 in | Wt 187.5 lb

## 2015-07-13 DIAGNOSIS — I6523 Occlusion and stenosis of bilateral carotid arteries: Secondary | ICD-10-CM

## 2015-07-13 DIAGNOSIS — I708 Atherosclerosis of other arteries: Secondary | ICD-10-CM | POA: Diagnosis not present

## 2015-07-13 DIAGNOSIS — E039 Hypothyroidism, unspecified: Secondary | ICD-10-CM

## 2015-07-13 DIAGNOSIS — E785 Hyperlipidemia, unspecified: Secondary | ICD-10-CM

## 2015-07-13 DIAGNOSIS — I771 Stricture of artery: Secondary | ICD-10-CM

## 2015-07-13 DIAGNOSIS — R1011 Right upper quadrant pain: Secondary | ICD-10-CM | POA: Diagnosis not present

## 2015-07-13 MED ORDER — CLOPIDOGREL BISULFATE 75 MG PO TABS: 75.0000 mg | ORAL_TABLET | Freq: Every day | ORAL | Status: DC

## 2015-07-13 NOTE — Patient Instructions (Addendum)
Your physician wants you to follow-up in: 1 year or sooner if needed. You will receive a reminder letter in the mail two months in advance. If you don't receive a letter, please call our office to schedule the follow-up appointment.  Your physician recommends that you return for FASTING lab work .

## 2015-07-15 ENCOUNTER — Encounter: Payer: Self-pay | Admitting: Cardiovascular Disease

## 2015-07-15 NOTE — Progress Notes (Signed)
Patient ID: Bethany Clarke, female   DOB: 01/30/1956, 59 y.o.   MRN: [id number]   HPI: Bethany Clarke is a 59 y.o. female who presents for a 28 month follow-up cardiology evaluation. I last saw her in May 2014.    Bethany Clarke  has a history of hypothyroidism and hyperlipidemia.  In September 2013, she  had an endoscopy for epigastric discomfort and was treated with Nexium. She also was told to have sludge in her gallbladder and some inflammation of her bile duct, for which she has seen Dr. Henrene Pastor. The patient has a very strong family history for coronary artery disease with a sister, who underwent CABG surgery at age 88, as well as a father, who died at a young age with cancer but also had heart disease, and also many family members with stroke and heart disease. The patient had a 44 year history of tobacco use, having started at age 2. She has noticed recurrent episodes of epigastric and somewhat right-sided chest discomfort.  When I saw her back then  He complained of a progressive decline in activity with development of exertional shortness of breath  And also had awakened from sleep with chest pain.  She ultimately underwentcardiac catheterization was on Mar 03, 2013  Which demonstratednormal LV function without definite obstructive disease. Her left artery system was entirely normal. She did have mild ostial calcification of her right coronary artery without stenosis.   When I saw her initially on Feb 19, 2013 she had a 30 mm blood pressure differential between her right and left arm. Subsequent carotid duplex imaging suggested proximal left subclavian occlusion. She did have 0-49% stenosis in the right internal carotid artery and had progressive stenosis in her left mid internal carotid now showing a 50-69% diameter reduction. She had retrograde flow in her left vertebral artery suggestive of subclavian steal. In retrospect, she does admit to episodes of dizziness. Upper extremity duplex imaging  revealed a 38 mm reduction in left brachial artery pressure. A 2-D echo Doppler study showed ejection fraction of 55-60% with normal diastolic parameters and mildly thickened mitral valve leaflets appear.  I referred Bethany Clarke to Dr. Gwenlyn Found and she ultimately underwent aortic arch angiography and bilateral subclavian angiography which revealed a 99% proximal left subclavian artery stenosis with retrograde filling of the left vertebral system. Her subclavian artery was successfully stented.  She had marked improvement in her subclavian steal symptoms and her left upper extremity Doppler normalized.  She had stopped smoking.  She had gained weight.  Follow-up Doppler studies in December 2015 were not significant leak changed since the year previously in the right brachial pressure was 143, and left brachial pressure 138 arguing against upper extremity inflow disease. Carotid studies in December 2015 showed mild plaque without high-grade stenosis.  Her left vertebral artery was not visualized.   Presently, Bethany Clarke denies chest pain.  She has had some issues of right upper quadrant discomfort.  In the past.  She was noted to have sludge in her gallbladder. She is unaware of palpitations.  She has been taking atorvastatin 40 mg for hyperlipidemia.  She continues to be on aspirin and Plavix following her subclavian stent.  She also is on levothyroxine 100 g for hypothyroidism.  She presents for evaluation.  ALLERGIES: SHE DENIES ANY ALLERGIES.    Past Medical History  Diagnosis Date  . GERD (gastroesophageal reflux disease)   . Hypothyroid   . Dizziness   . Syncope, near   .  Heart burn   . H. pylori infection   . Atherosclerosis 11/14/2005    carotid US - mild calcific non-stenotic plaque bilaterally  . Carotid bruit 02/05/2009    Doppler - R/L ICAs 0-49% diameter reductin (velocities suggest low-mid range); L subclavian 0-49%  diameter reduction  . Lipidemia   . Emphysema of lung   . Pneumonia ~  2009  . Chronic bronchitis     "growing up; not anymore" (03/14/2013)  . Subclavian artery stenosis, left     subclavian steal physiology status post PTA and stenting    Past Surgical History  Procedure Laterality Date  . Abdominal hysterectomy  1999  . Subclavian artery stent Left 03/14/2013     for subclavian steal/claudication (03/14/2013)  . Dilation and curettage of uterus  1980's  . Tubal ligation  1980's  . Breast biopsy Right 1990's    "benign" (03/14/2013)  . Cardiac catheterization  03/03/2013  . Left heart catheterization with coronary angiogram Bilateral 03/03/2013    Procedure: LEFT HEART CATHETERIZATION WITH CORONARY ANGIOGRAM;  Surgeon: Troy Sine, MD;  Location: [facility] CATH LAB;  Service: Cardiovascular;  Laterality: Bilateral;  . Bilateral upper extremity angiogram N/A 03/14/2013    Procedure: BILATERAL UPPER EXTREMITY ANGIOGRAM;  Surgeon: Lorretta Harp, MD;  Location: [facility] CATH LAB;  Service: Cardiovascular;  Laterality: N/A;  . Percutaneous stent intervention  03/14/2013    Procedure: PERCUTANEOUS STENT INTERVENTION;  Surgeon: Lorretta Harp, MD;  Location: [facility] C[facility] LAB;  Service: Cardiovascular;;    No Known Allergies  Current Outpatient Prescriptions  Medication Sig Dispense Refill  . aspirin 81 MG tablet Take 81 mg by mouth daily.    Marland Kitchen atorvastatin (LIPITOR) 40 MG tablet TAKE 1 TABLET (40 MG TOTAL) BY MOUTH DAILY. 90 tablet 1  . clopidogrel (PLAVIX) 75 MG tablet Take 1 tablet (75 mg total) by mouth daily. 90 tablet 3  . erythromycin ophthalmic ointment Place 1 application into both eyes 4 (four) times daily.  0  . levothyroxine (SYNTHROID) 100 MCG tablet TAKE 1 TABLET BY MOUTH DAILY BEFORE BREAKFAST. 90 tablet 1  . LOTEMAX 0.5 % GEL Place 1 drop into both eyes at bedtime.  0  . valACYclovir (VALTREX) 500 MG tablet Take 1 tablet (500 mg total) by mouth 2 (two) times daily. 90 tablet 3   No current facility-administered medications for this visit.    SOCIAL  HISTORY: She is married, has three children. She works for [employer], who is also my patient, who recommended my name for the patient to see. The patient does not routinely exercise. There is no alcohol use. She had smoked for 44 years, and quit smoking in May 2014.  ROS General: Negative; No fevers, chills, or night sweats;  HEENT: Negative; No changes in vision or hearing, sinus congestion, difficulty swallowing Pulmonary: Negative; No cough, wheezing, shortness of breath, hemoptysis Cardiovascular: Negative; No chest pain, presyncope, syncope, palpitations GI:  Positive for transient right upper quadrant discomfort GU: Negative; No dysuria, hematuria, or difficulty voiding Musculoskeletal: Negative; no myalgias, joint pain, or weakness Hematologic/Oncology: Negative; no easy bruising, bleeding Endocrine: Negative; no heat/cold intolerance; no diabetes Neuro: Negative; no changes in balance, headaches Skin: Negative; No rashes or skin lesions Psychiatric: Negative; No behavioral problems, depression Sleep: Negative; No snoring, daytime sleepiness, hypersomnolence, bruxism, restless legs, hypnogognic hallucinations, no cataplexy Other comprehensive 14 point system review is negative.   PE BP 128/84 mmHg  Pulse 64  Ht 5' 5"  (1.651 m)  Wt 187 lb 8  oz (85.049 kg)  BMI 31.20 kg/m2 Blood pressure was 13478 in the right arm, but was 128/80 in the left arm. Wt Readings from Last 3 Encounters:  07/13/15 187 lb 8 oz (85.049 kg)  04/12/15 186 lb 9.6 oz (84.641 kg)  10/14/14 188 lb 6.4 oz (85.458 kg)   General: Alert, oriented, no distress.  Skin: normal turgor, no rashes, warm and dry HEENT: Normocephalic, atraumatic. Pupils equal round and reactive to light; sclera anicteric; extraocular muscles intact, No lid lag; Nose without nasal septal hypertrophy; Mouth/Parynx benign; Mallinpatti scale 3 Neck: No JVD, no carotid bruits; normal carotid upstroke Lungs: clear to ausculatation and  percussion bilaterally; no wheezing or rales, normal inspiratory and expiratory effort Chest wall: without tenderness to palpitation Heart: PMI not displaced, RRR, s1 s2 normal, 1/6 systolic murmur, No diastolic murmur, no rubs, gallops, thrills, or heaves Abdomen:  Minimal right upper quadrant tenderness; no hepatosplenomehaly, BS+; abdominal aorta nontender and not dilated by palpation. Back: no CVA tenderness Pulses: 2+  Musculoskeletal: full range of motion, normal strength, no joint deformities Extremities: Pulses 2+, no clubbing cyanosis or edema, Homan's sign negative  Neurologic: grossly nonfocal; Cranial nerves grossly wnl Psychologic: Normal mood and affect  ECG (independently read by me):  Normal sinus rhythm at 64 bpm.  Nonspecific T changes anteriorly   May 2014ECG: NSR at 63, anterior T wave abnormality  LABS: BMP Latest Ref Rng 04/12/2015 02/06/2014 09/07/2013  Glucose 70 - 99 mg/dL 88 88 107(H)  BUN 6 - 23 mg/dL 11 11 9   Creatinine 0.50 - 1.10 mg/dL 0.64 0.56 0.57  Sodium 135 - 145 mEq/L 138 139 137  Potassium 3.5 - 5.3 mEq/L 4.3 4.6 3.9  Chloride 96 - 112 mEq/L 100 100 100  CO2 19 - 32 mEq/L 26 28 23   Calcium 8.4 - 10.5 mg/dL 9.8 9.9 9.8   Hepatic Function Latest Ref Rng 04/12/2015 02/06/2014 09/07/2013  Total Protein 6.0 - 8.3 g/dL 7.6 7.6 7.8  Albumin 3.5 - 5.2 g/dL 4.5 4.6 4.3  AST 0 - 37 U/L 20 20 20   ALT 0 - 35 U/L 39(H) 44(H) 35  Alk Phosphatase 39 - 117 U/L 76 90 90  Total Bilirubin 0.2 - 1.2 mg/dL 0.3 0.4 0.3  Bilirubin, Direct 0.0 - 0.3 mg/dL - - -   CBC Latest Ref Rng 02/06/2014 09/07/2013 08/20/2013  WBC 4.6 - 10.2 K/uL 4.3(A) 7.0 11.1(A)  Hemoglobin 12.2 - 16.2 g/dL 14.3 14.3 13.9  Hematocrit 37.7 - 47.9 % 44.8 41.9 44.0  Platelets 150 - 400 K/uL - 257 -   Lab Results  Component Value Date   MCV 98.7* 02/06/2014   MCV 96.8 09/07/2013   MCV 100.8* 08/20/2013   Lab Results  Component Value Date   TSH 1.707 04/12/2015   Lab Results  Component  Value Date   HGBA1C 5.4 06/24/2013   Lipid Panel     Component Value Date/Time   CHOL 147 04/12/2015 1514   TRIG 238* 04/12/2015 1514   HDL 57 04/12/2015 1514   CHOLHDL 2.6 04/12/2015 1514   VLDL 48* 04/12/2015 1514   LDLCALC 42 04/12/2015 1514     RADIOLOGY: No results found.    ASSESSMENT AND PLAN: Bethany Clarke is a 59 year old female who has documented normal coronary arteries with the exception of ostial calcification of her right coronary artery.  She is status post stenting of her left subclavian artery in May 2014.  I reviewed the records from Dr. Gwenlyn Found. She continues to do well  with reference to her peripheral vascular disease. She had a 44 year history of tobacco use and fortunately quit in May 2014. She has noticed some intermittent right upper quadrant pain.  She also has noticed some mild shortness of breath with walking and nocturnal cramps. She did not have significant blood pressure differential in her arms.  On exam. She is on lipid lowering therapy and laboratory was excellent in June 2016 with an LDL of 42.  She has noticed some mild right upper quadrant tenderness.  Remotely, she had been found to have sludge in her gallbladder.  I will recheck a chemistry profile today as well as a lipid and amylase level.  Cardiovascularly, she is doing well.  We discussed the importance of exercise and weight loss.  As long as she is stable, I will see her in one year for cardiology reevaluation.  Time spent: 30 minutes  Troy Sine, MD, [facility]  07/15/2015 7:09 PM

## 2015-09-21 ENCOUNTER — Other Ambulatory Visit: Payer: Self-pay | Admitting: Cardiovascular Disease

## 2015-09-21 DIAGNOSIS — I6523 Occlusion and stenosis of bilateral carotid arteries: Secondary | ICD-10-CM

## 2015-09-28 ENCOUNTER — Telehealth: Payer: Self-pay | Admitting: Internal Medicine

## 2015-09-28 NOTE — Telephone Encounter (Signed)
Pt states she has been having RUQ abd pain for about 2 weeks. States when she bends over it feels like there is something there. Pt requesting to be seen sooner than first available. Pt scheduled to see Dr. Marina GoodellPerry 09/30/16@2pm . Pt aware of appt.

## 2015-10-01 ENCOUNTER — Ambulatory Visit (INDEPENDENT_AMBULATORY_CARE_PROVIDER_SITE_OTHER): Payer: BLUE CROSS/BLUE SHIELD | Admitting: Physician Assistant

## 2015-10-01 ENCOUNTER — Ambulatory Visit (INDEPENDENT_AMBULATORY_CARE_PROVIDER_SITE_OTHER)
Admission: RE | Admit: 2015-10-01 | Discharge: 2015-10-01 | Disposition: A | Discharge status: Home / Self Care | Payer: BLUE CROSS/BLUE SHIELD | Source: Ambulatory Visit | Attending: Physician Assistant | Admitting: Physician Assistant

## 2015-10-01 ENCOUNTER — Encounter: Payer: Self-pay | Admitting: Physician Assistant

## 2015-10-01 ENCOUNTER — Other Ambulatory Visit (INDEPENDENT_AMBULATORY_CARE_PROVIDER_SITE_OTHER): Payer: BLUE CROSS/BLUE SHIELD

## 2015-10-01 ENCOUNTER — Other Ambulatory Visit: Payer: Self-pay | Admitting: Emergency Medicine

## 2015-10-01 ENCOUNTER — Telehealth: Payer: Self-pay | Admitting: Physician Assistant

## 2015-10-01 VITALS — BP 106/76 | HR 80 | Ht 64.5 in | Wt 189.4 lb

## 2015-10-01 DIAGNOSIS — R0781 Pleurodynia: Secondary | ICD-10-CM

## 2015-10-01 DIAGNOSIS — M940 Chondrocostal junction syndrome [Tietze]: Secondary | ICD-10-CM | POA: Diagnosis not present

## 2015-10-01 LAB — CBC WITH DIFFERENTIAL/PLATELET
BASOS ABS: 0 10*3/uL (ref 0.0–0.1)
Basophils Relative: 0.1 % (ref 0.0–3.0)
Eosinophils Absolute: 0.1 10*3/uL (ref 0.0–0.7)
Eosinophils Relative: 1.1 % (ref 0.0–5.0)
HCT: 42.6 % (ref 36.0–46.0)
Hemoglobin: 14.1 g/dL (ref 12.0–15.0)
LYMPHS ABS: 2.6 10*3/uL (ref 0.7–4.0)
Lymphocytes Relative: 49.2 % — ABNORMAL HIGH (ref 12.0–46.0)
MCHC: 33.2 g/dL (ref 30.0–36.0)
MCV: 98 fl (ref 78.0–100.0)
MONO ABS: 0.6 10*3/uL (ref 0.1–1.0)
MONOS PCT: 12.1 % — AB (ref 3.0–12.0)
Neutro Abs: 2 10*3/uL (ref 1.4–7.7)
Neutrophils Relative %: 37.5 % — ABNORMAL LOW (ref 43.0–77.0)
PLATELETS: 305 10*3/uL (ref 150.0–400.0)
RBC: 4.35 Mil/uL (ref 3.87–5.11)
RDW: 12.4 % (ref 11.5–15.5)
WBC: 5.2 10*3/uL (ref 4.0–10.5)

## 2015-10-01 LAB — BASIC METABOLIC PANEL
BUN: 9 mg/dL (ref 6–23)
CO2: 32 mEq/L (ref 19–32)
Calcium: 10.2 mg/dL (ref 8.4–10.5)
Chloride: 101 mEq/L (ref 96–112)
Creatinine, Ser: 0.76 mg/dL (ref 0.40–1.20)
GFR: 82.73 mL/min (ref >60.00)
Glucose, Bld: 84 mg/dL (ref 70–99)
POTASSIUM: 4.4 meq/L (ref 3.5–5.1)
Sodium: 140 mEq/L (ref 135–145)

## 2015-10-01 LAB — HEPATIC FUNCTION PANEL
ALBUMIN: 4.8 g/dL (ref 3.5–5.2)
ALK PHOS: 75 U/L (ref 39–117)
ALT: 31 U/L (ref 0–35)
AST: 18 U/L (ref 0–37)
Bilirubin, Direct: 0.1 mg/dL (ref 0.0–0.3)
Total Bilirubin: 0.3 mg/dL (ref 0.2–1.2)
Total Protein: 8.2 g/dL (ref 6.0–8.3)

## 2015-10-01 LAB — LIPASE: LIPASE: 33 U/L (ref 11.0–59.0)

## 2015-10-01 MED ORDER — PREDNISONE 10 MG PO TABS: 10.0000 mg | ORAL_TABLET | ORAL | Status: DC

## 2015-10-01 NOTE — Telephone Encounter (Signed)
Patient is calling for xray results. Please, advise.

## 2015-10-01 NOTE — Progress Notes (Signed)
Agree with initial assessment and plans. If truly musculoskeletal and doesn't respond to these measures, then she should return to her PCP for further management

## 2015-10-01 NOTE — Telephone Encounter (Signed)
C result

## 2015-10-01 NOTE — Progress Notes (Signed)
Patient ID: Bethany Clarke, female   DOB: 01/10/1956, 59 y.o.   MRN: [id number]     History of Present Illness: Bethany Clarke  Is a pleasant 59 year old female who was known to Dr. Marina GoodellPerry. She had initially been evaluated in October 2013 regarding abdominal pain associated with nausea, vomiting and postprandial loose stools. She was noted to have an isolated extrahepatic bile duct dilation on ultrasound. Upper endoscopy showed mild gastroduodenitis, H. Pylori positive. She was treated. Subsequent MRI/MRCP revealed mild dilation of the extrahepatic bile duct with no other significant abnormalities. Liver tests were normal. He was last seen in March 2014 with complaints of continued right upper quadrant pain of one to 2 weeks' duration. She describes the pain as sharp and lasting for about 30 minutes. It was not related to meals or time of day and had no nocturnal cough component. She was advised to follow up as needed. Her pain has been on and off since then however for the past 2 months it has been fairly constant and occurring on a daily basis. She points to the area over the lower right ribs and costal margin. Her pain is noticeable if she lays on her right side. When she bends to pink something up she feels as if she gets a pinching sensation ER if she twists to the left she gets a sharp pain in the right lower ribs. Her pain is exacerbated with coughing or sneezing. She has no associated nausea, vomiting, or epigastric pain. Her bowel movements have been regular. Her pain does not radiate. The pain hurts more if she presses on the area. She has been using ibuprofen with no relief.   Past Medical History  Diagnosis Date  . GERD (gastroesophageal reflux disease)   . Hypothyroid   . Dizziness   . Syncope, near   . Heart burn   . H. pylori infection   . Atherosclerosis 11/14/2005    carotid US - mild calcific non-stenotic plaque bilaterally  . Carotid bruit 02/05/2009    Doppler - R/L ICAs  0-49% diameter reductin (velocities suggest low-mid range); L subclavian 0-49%  diameter reduction  . Lipidemia   . Emphysema of lung (HCC)   . Pneumonia ~ 2009  . Chronic bronchitis (HCC)     "growing up; not anymore" (03/14/2013)  . Subclavian artery stenosis, left     subclavian steal physiology status post PTA and stenting    Past Surgical History  Procedure Laterality Date  . Abdominal hysterectomy  1999  . Subclavian artery stent Left 03/14/2013     for subclavian steal/claudication (03/14/2013)  . Dilation and curettage of uterus  1980's  . Tubal ligation  1980's  . Breast biopsy Right 1990's    "benign" (03/14/2013)  . Cardiac catheterization  03/03/2013  . Left heart catheterization with coronary angiogram Bilateral 03/03/2013    Procedure: LEFT HEART CATHETERIZATION WITH CORONARY ANGIOGRAM;  Surgeon: Lennette Biharihomas A Kelly, MD;  [facility];  Service: Cardiovascular;  Laterality: Bilateral;  . Bilateral upper extremity angiogram N/A 03/14/2013    Procedure: BILATERAL UPPER EXTREMITY ANGIOGRAM;  Surgeon: Runell GessJonathan J Berry, MD;  [facility];  Service: Cardiovascular;  Laterality: N/A;  . Percutaneous stent intervention  03/14/2013    Procedure: PERCUTANEOUS STENT INTERVENTION;  Surgeon: Runell GessJonathan J Berry, MD;  [facility];  Service: Cardiovascular;;   Family History  Problem Relation Age of Onset  . Colon cancer Neg Hx   . Cancer Brother  spine/lung  . COPD Brother   . Hypertension Brother   . Rheum arthritis Brother   . Hyperlipidemia Brother   . Hypertension Brother   . Diabetes Mother   . Heart disease Mother   . Stroke Mother   . Cancer Father     cirrohosis  . Hypertension Father   . Heart disease Sister   . Diabetes Sister   . Hyperlipidemia Sister   . Hypertension Sister   . COPD Sister   . Arthritis Sister     rheumatoid  . Diabetes Sister   . Arthritis Sister     rheumatoid   Social History  Substance Use Topics  . Smoking  status: Former Smoker -- 0.50 packs/day for 40 years    Types: Cigarettes    Quit date: 02/11/2013  . Smokeless tobacco: Never Used  . Alcohol Use: No   Current Outpatient Prescriptions  Medication Sig Dispense Refill  . aspirin 81 MG tablet Take 81 mg by mouth daily.    Bethany Clarke Kitchen atorvastatin (LIPITOR) 40 MG tablet TAKE 1 TABLET (40 MG TOTAL) BY MOUTH DAILY. 90 tablet 1  . clopidogrel (PLAVIX) 75 MG tablet Take 1 tablet (75 mg total) by mouth daily. 90 tablet 3  . levothyroxine (SYNTHROID) 100 MCG tablet TAKE 1 TABLET BY MOUTH DAILY BEFORE BREAKFAST. 90 tablet 1  . predniSONE (DELTASONE) 10 MG tablet Take 1 tablet (10 mg total) by mouth as directed. 100 tablet 0   No current facility-administered medications for this visit.   No Known Allergies   Review of Systems: Gen: Denies any fever, chills, sweats, anorexia, fatigue, weakness, malaise, weight loss, and sleep disorder CV: Denies chest pain, angina, palpitations, syncope, orthopnea, PND, peripheral edema, and claudication. Resp: Denies dyspnea at rest, dyspnea with exercise, cough, sputum, wheezing, coughing up blood, and pleurisy. GI: Denies vomiting blood, jaundice, and fecal incontinence.   Denies dysphagia or odynophagia. GU : Denies urinary burning, blood in urine, urinary frequency, urinary hesitancy, nocturnal urination, and urinary incontinence. MS: Denies joint pain, limitation of movement, and swelling, stiffness, low back pain, extremity pain. Denies muscle weakness, cramps, atrophy.  Derm: Denies rash, itching, dry skin, hives, moles, warts, or unhealing ulcers.  Psych: Denies depression, anxiety, memory loss, suicidal ideation, hallucinations, paranoia, and confusion. Heme: Denies bruising, bleeding, and enlarged lymph nodes. Neuro:  Denies any headaches, dizziness, paresthesia Endo:  Denies any problems with DM, thyroid, adrenal    Physical Exam: BP 106/76 mmHg  Pulse 80  Ht 5' 4.5" (1.638 m)  Wt 189 lb 6 oz (85.9 kg)   BMI 32.02 kg/m2 General: Pleasant, well developed ,  Caucasianfemale in no acute distress Head: Normocephalic and atraumatic Eyes:  sclerae anicteric, conjunctiva pink  Ears: Normal auditory acuity Lungs: Clear throughout to auscultation. Chest wall without obvious bony deformity, atrophy, or spasm. She is tender to palpation over the [facility] to anterior right ribs and along the costal margin. Pain is increased with twisting to the left,  Spinal extension over 40, and forward flexion over 50. Heart: Regular rate and rhythm Abdomen: Soft, non distended, non-tender. No masses, no hepatomegaly. Normal bowel sounds Musculoskeletal: Symmetrical with no gross deformities  Extremities: No edema  Neurological: Alert oriented x 4, grossly nonfocal Psychological:  Alert and cooperative. Normal mood and affect  Assessment and Recommendations:  59 year old female with ongoing right sided pain. Her pain seems to be musculoskeletal in nature. It is point tender to palpation and worst with twisting, extending, and flexing. It is also exacerbated with  cough or sneeze. It may be representative of a costochondritis. Right rib films will be obtained. She's been advised to discontinue her ibuprofen as she is on Plavix. She will be given a trial of prednisone 10 mg 4 tabs once daily for 3 days, then 3 tabs once daily for 3 days, then 2 tabs once daily for 3 days, then 1 tablet once daily for 3 days then stop. Her completeness sake, a CBC, basic metabolic panel, hepatic function panel, and lipase will be obtained. She will follow up in 6 weeks, sooner if needed.   She will be due for routine surveillance colonoscopy in 2020.( negative screening colonoscopy 2010).      Mourad Cwikla, Tollie Pizza PA-C 10/01/2015,

## 2015-10-01 NOTE — Telephone Encounter (Signed)
Left a message for patient to call back. See results.

## 2015-10-01 NOTE — Patient Instructions (Addendum)
Your physician has requested that you go to the basement for lab work and an x-ray before leaving today.  Please stop Ibuprofen.   We have sent the following medications to your pharmacy for you to pick up at your convenience: Prednisone 10 mg 4 tabs x 3 days 3 tabs x 3 days 2 tabs x 2 days 1 tab   X 3 days then stop  Please keep your follow up with Dr. Marina GoodellPerry in January.

## 2015-10-04 ENCOUNTER — Other Ambulatory Visit: Payer: Self-pay | Admitting: Family Medicine

## 2015-10-14 ENCOUNTER — Ambulatory Visit (HOSPITAL_COMMUNITY)
Admission: RE | Admit: 2015-10-14 | Discharge: 2015-10-14 | Disposition: A | Discharge status: Home / Self Care | Payer: BLUE CROSS/BLUE SHIELD | Source: Ambulatory Visit | Attending: Cardiovascular Disease | Admitting: Cardiovascular Disease

## 2015-10-14 DIAGNOSIS — I6523 Occlusion and stenosis of bilateral carotid arteries: Secondary | ICD-10-CM | POA: Diagnosis not present

## 2015-10-16 ENCOUNTER — Ambulatory Visit (INDEPENDENT_AMBULATORY_CARE_PROVIDER_SITE_OTHER): Payer: BLUE CROSS/BLUE SHIELD | Admitting: Family Medicine

## 2015-10-16 ENCOUNTER — Ambulatory Visit (INDEPENDENT_AMBULATORY_CARE_PROVIDER_SITE_OTHER): Payer: BLUE CROSS/BLUE SHIELD

## 2015-10-16 VITALS — BP 122/80 | HR 75 | Temp 98.0°F | Resp 16 | Ht 65.0 in | Wt 191.0 lb

## 2015-10-16 DIAGNOSIS — R05 Cough: Secondary | ICD-10-CM

## 2015-10-16 DIAGNOSIS — R059 Cough, unspecified: Secondary | ICD-10-CM

## 2015-10-16 DIAGNOSIS — J441 Chronic obstructive pulmonary disease with (acute) exacerbation: Secondary | ICD-10-CM | POA: Diagnosis not present

## 2015-10-16 DIAGNOSIS — R0789 Other chest pain: Secondary | ICD-10-CM

## 2015-10-16 DIAGNOSIS — R062 Wheezing: Secondary | ICD-10-CM

## 2015-10-16 NOTE — Progress Notes (Addendum)
Subjective:  This chart was scribed for Meredith Staggers, MD by [facility], medical scribe at [facility].The patient was seen in exam room 11 and the patient's care was started at 12:16 PM.   Patient ID: Bethany Clarke, female    DOB: [birth year], 59 y.o.   MRN: [id number] Chief Complaint  Patient presents with  . Cough    x 8-9 days  . chest congestion    x 2 days  . Chest Pain    x 2 days   HPI HPI Comments: Bethany Clarke is a 59 y.o. female who presents to [facility] due to a dry cough and wheezing which began about two weeks ago. Also been having chest and sinus congestion which is worse over the past two day. Seen at another clinic on five days ago, she was  thought to have pneumonia given Z-pak. Also placed on a prednisone taper from 40 mg to 10 mg at GI after chest x-ray with rib series on 12/16 showed mild left base subsegmental atelectasis. Currently taking 10 mg prednisone. Hx of tobacco abuse, COPD, but declined treatment for COPD in the past. See previous visits. Has albuterol but will not use it.   Patient Active Problem List   Diagnosis Date Noted  . Paresthesia 07/14/2013  . Bilateral foot pain 05/01/2013  . Subclavian artery stenosis, left   . Subclavian steal syndrome 03/16/2013  . Subclavian arterial stenosis (HCC) 03/11/2013  . Carotid stenosis 03/11/2013  . Hyperlipidemia LDL goal < 70 03/11/2013  . GERD (gastroesophageal reflux disease)   . Hypothyroid   . Dizziness   . Syncope, near   . Heart burn   . Hypothyroidism 05/28/2007  . COPD 05/28/2007   Past Medical History  Diagnosis Date  . GERD (gastroesophageal reflux disease)   . Hypothyroid   . Dizziness   . Syncope, near   . Heart burn   . H. pylori infection   . Atherosclerosis 11/14/2005    carotid US - mild calcific non-stenotic plaque bilaterally  . Carotid bruit 02/05/2009    Doppler - R/L ICAs 0-49% diameter reductin (velocities suggest  low-mid range); L subclavian 0-49%  diameter reduction  . Lipidemia   . Emphysema of lung (HCC)   . Pneumonia ~ 2009  . Chronic bronchitis (HCC)     "growing up; not anymore" (03/14/2013)  . Subclavian artery stenosis, left     subclavian steal physiology status post PTA and stenting   Past Surgical History  Procedure Laterality Date  . Abdominal hysterectomy  1999  . Subclavian artery stent Left 03/14/2013     for subclavian steal/claudication (03/14/2013)  . Dilation and curettage of uterus  1980's  . Tubal ligation  1980's  . Breast biopsy Right 1990's    "benign" (03/14/2013)  . Cardiac catheterization  03/03/2013  . Left heart catheterization with coronary angiogram Bilateral 03/03/2013    Procedure: LEFT HEART CATHETERIZATION WITH CORONARY ANGIOGRAM;  Surgeon: Lennette Bihari, MD;  Location: [facility] CATH LAB;  Service: Cardiovascular;  Laterality: Bilateral;  . Bilateral upper extremity angiogram N/A 03/14/2013    Procedure: BILATERAL UPPER EXTREMITY ANGIOGRAM;  Surgeon: [name], MD;  Location: [facility] CATH LAB;  Service: Cardiovascular;  Laterality: N/A;  . Percutaneous stent intervention  03/14/2013    Procedure: PERCUTANEOUS STENT INTERVENTION;  Surgeon: [name], MD;  Location[facility]ATH LAB;  Service: Cardiovascular;;   No Known Allergies Prior to Admission medications  Medication Sig Start Date End Date Taking? Authorizing Provider  aspirin 81 MG tablet Take 81 mg by mouth daily.   Yes Historical Provider, MD  atorvastatin (LIPITOR) 40 MG tablet TAKE 1 TABLET BY MOUTH EVERY DAY  "OFFICE VISIT NEEDED FOR REFILLS" 10/04/15  Yes [name], MD  clopidogrel (PLAVIX) 75 MG tablet Take 1 tablet (75 mg total) by mouth daily. 07/13/15  Yes Lennette Bihari, MD  levothyroxine (SYNTHROID) 100 MCG tablet TAKE 1 TABLET BY MOUTH DAILY BEFORE BREAKFAST. 04/12/15  Yes [name], MD  predniSONE (DELTASONE) 10 MG tablet Take 1 tablet (10 mg total) by mouth as directed. 10/01/15  Yes  Lori P Hvozdovic, PA-C   Social History   Social History  . Marital Status: Married    Spouse Name: N/A  . Number of Children: 3  . Years of Education: N/A   Occupational History  . 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 Smoking status: Former Smoker -- 0.50 packs/day for 40 years    Types: Cigarettes    Quit date: 02/11/2013  . Smokeless tobacco: Never Used  . Alcohol Use: No  . Drug Use: No  . Sexual Activity: Yes     Comment: number of sex partners in the last 12 months  1   Other Topics Concern  . Not on file   Social History Narrative   Daily caffeine. Married. Education: [employer]. Exercise: No.   Review of Systems  HENT: Positive for congestion.   Respiratory: Positive for cough, shortness of breath and wheezing.       Objective:  BP 122/80 mmHg  Pulse 75  Temp(Src) 98 F (36.7 C) (Oral)  Resp 16  Ht  (1.651 m)  Wt 191 lb (86.637 kg)  BMI 31.78 kg/m2  SpO2 95% Physical Exam  Constitutional: She is oriented to person, place, and time. She appears well-developed and well-nourished. No distress.  HENT:  Head: Normocephalic and atraumatic.  Eyes: Pupils are equal, round, and reactive to light.  Neck: Normal range of motion.  No stridor.  Cardiovascular: Normal rate and regular rhythm.   Pulmonary/Chest: Effort normal. No respiratory distress. She has wheezes.  Distant breath sounds with faint wheeze. Normal effort.  Musculoskeletal: Normal range of motion.  Neurological: She is alert and oriented to person, place, and time.  Skin: Skin is warm and dry.  Psychiatric: She has a normal mood and affect. Her behavior is normal.  Nursing note and vitals reviewed.  [facility] reading (PRIMARY) by  Dr. Neva Seat: CXR: Atelectasis versus scar lower lobes. No acute infiltrate seen.     Assessment & Plan:   Bethany Clarke is a 59 y.o. female Cough - Plan: DG Chest 2 View  Wheezing - Plan: DG Chest 2 View  COPD exacerbation (HCC) - Plan: DG Chest  2 View  Chest wall pain  Possible initial viral illness with worsening cough. Underlying COPD without treatment. She has declined inhalers in the past as well as declines inhalers today including albuterol  Just finished azithromycin yesterday, so this is still treating possible bacterial component. Will increase prednisone to 40 mg a day for 3 days, then 30 mg for 2 days, then 20 mg per day for 2 days. Then 10 mg for 2 days. RTC/ER/911 precautions discussed, and risks of not using inhaler such as albuterol were discussed. Understanding expressed.  Pain with cough likely chest wall pain versus costochondritis it was recently treated by GI. Symptomatic care,  prednisone increase as above. RTC/ER precautions.  No orders of the defined types were placed in this encounter.   Patient Instructions  I suspect her cough is worse due to COPD flare. Increase the prednisone up to 4 pills per day for the next 3 days then 3 pills per day for 2 days, 2 pills per day for 2 days, then 1 pill per day for 2 days. Stop prednisone after this course. I do recommend albuterol inhaler for wheezing, but as this was declined today - return here or the emergency room if needed for worsening symptoms.  If you do have a fever return for recheck and blood work.  Cough, Adult Coughing is a reflex that clears your throat and your airways. Coughing helps to heal and protect your lungs. It is normal to cough occasionally, but a cough that happens with other symptoms or lasts a long time may be a sign of a condition that needs treatment. A cough may last only 2-3 weeks (acute), or it may last longer than 8 weeks (chronic). CAUSES Coughing is commonly caused by:  Breathing in substances that irritate your lungs.  A viral or bacterial respiratory infection.  Allergies.  Asthma.  Postnasal drip.  Smoking.  Acid backing up from the stomach into the esophagus (gastroesophageal reflux).  Certain medicines.  Chronic lung  problems, including COPD (or rarely, lung cancer).  Other medical conditions such as heart failure. HOME CARE INSTRUCTIONS  Pay attention to any changes in your symptoms. Take these actions to help with your discomfort:  Take medicines only as told by your health care provider.  If you were prescribed an antibiotic medicine, take it as told by your health care provider. Do not stop taking the antibiotic even if you start to feel better.  Talk with your health care provider before you take a cough suppressant medicine.  Drink enough fluid to keep your urine clear or pale yellow.  If the air is dry, use a cold steam vaporizer or humidifier in your bedroom or your home to help loosen secretions.  Avoid anything that causes you to cough at work or at home.  If your cough is worse at night, try sleeping in a semi-upright position.  Avoid cigarette smoke. If you smoke, quit smoking. If you need help quitting, ask your health care provider.  Avoid caffeine.  Avoid alcohol.  Rest as needed. SEEK MEDICAL CARE IF:   You have new symptoms.  You cough up pus.  Your cough does not get better after 2-3 weeks, or your cough gets worse.  You cannot control your cough with suppressant medicines and you are losing sleep.  You develop pain that is getting worse or pain that is not controlled with pain medicines.  You have a fever.  You have unexplained weight loss.  You have night sweats. SEEK IMMEDIATE MEDICAL CARE IF:  You cough up blood.  You have difficulty breathing.  Your heartbeat is very fast.   This information is not intended to replace advice given to you by your health care provider. Make sure you discuss any questions you have with your health care provider.   Document Released: 03/31/2011 Document Revised: 06/23/2015 Document Reviewed: 12/09/2014 Elsevier Interactive Patient Education 2016 Elsevier Inc. Chest Wall Pain Chest wall pain is pain in or around the  bones and muscles of your chest. Sometimes, an injury causes this pain. Sometimes, the cause may not be known. This pain may take several weeks or longer to get  better. HOME CARE INSTRUCTIONS  Pay attention to any changes in your symptoms. Take these actions to help with your pain:   Rest as told by your health care provider.   Avoid activities that cause pain. These include any activities that use your chest muscles or your abdominal and side muscles to lift heavy items.   If directed, apply ice to the painful area:  Put ice in a plastic bag.  Place a towel between your skin and the bag.  Leave the ice on for 20 minutes, 2-3 times per day.  Take over-the-counter and prescription medicines only as told by your health care provider.  Do not use tobacco products, including cigarettes, chewing tobacco, and e-cigarettes. If you need help quitting, ask your health care provider.  Keep all follow-up visits as told by your health care provider. This is important. SEEK MEDICAL CARE IF:  You have a fever.  Your chest pain becomes worse.  You have new symptoms. SEEK IMMEDIATE MEDICAL CARE IF:  You have nausea or vomiting.  You feel sweaty or light-headed.  You have a cough with phlegm (sputum) or you cough up blood.  You develop shortness of breath.   This information is not intended to replace advice given to you by your health care provider. Make sure you discuss any questions you have with your health care provider.   Document Released: 10/02/2005 Document Revised: 06/23/2015 Document Reviewed: 12/28/2014 Elsevier Interactive Patient Education [employer]     I personally performed the services described in this documentation, which was scribed in my presence. The recorded information has been reviewed and considered, and addended by me as needed.     By signing my name below, I, Nadim Abuhashem, attest that this documentation has been prepared under the  direction and in the presence of Meredith Staggers, MD.  Electronically Signed: Conchita Paris, medical scribe. 10/16/2015, 12:27 PM.

## 2015-10-16 NOTE — Patient Instructions (Addendum)
I suspect her cough is worse due to COPD flare. Increase the prednisone up to 4 pills per day for the next 3 days then 3 pills per day for 2 days, 2 pills per day for 2 days, then 1 pill per day for 2 days. Stop prednisone after this course. I do recommend albuterol inhaler for wheezing, but as this was declined today - return here or the emergency room if needed for worsening symptoms.  If you do have a fever return for recheck and blood work.  Cough, Adult Coughing is a reflex that clears your throat and your airways. Coughing helps to heal and protect your lungs. It is normal to cough occasionally, but a cough that happens with other symptoms or lasts a long time may be a sign of a condition that needs treatment. A cough may last only 2-3 weeks (acute), or it may last longer than 8 weeks (chronic). CAUSES Coughing is commonly caused by:  Breathing in substances that irritate your lungs.  A viral or bacterial respiratory infection.  Allergies.  Asthma.  Postnasal drip.  Smoking.  Acid backing up from the stomach into the esophagus (gastroesophageal reflux).  Certain medicines.  Chronic lung problems, including COPD (or rarely, lung cancer).  Other medical conditions such as heart failure. HOME CARE INSTRUCTIONS  Pay attention to any changes in your symptoms. Take these actions to help with your discomfort:  Take medicines only as told by your health care provider.  If you were prescribed an antibiotic medicine, take it as told by your health care provider. Do not stop taking the antibiotic even if you start to feel better.  Talk with your health care provider before you take a cough suppressant medicine.  Drink enough fluid to keep your urine clear or pale yellow.  If the air is dry, use a cold steam vaporizer or humidifier in your bedroom or your home to help loosen secretions.  Avoid anything that causes you to cough at work or at home.  If your cough is worse at night,  try sleeping in a semi-upright position.  Avoid cigarette smoke. If you smoke, quit smoking. If you need help quitting, ask your health care provider.  Avoid caffeine.  Avoid alcohol.  Rest as needed. SEEK MEDICAL CARE IF:   You have new symptoms.  You cough up pus.  Your cough does not get better after 2-3 weeks, or your cough gets worse.  You cannot control your cough with suppressant medicines and you are losing sleep.  You develop pain that is getting worse or pain that is not controlled with pain medicines.  You have a fever.  You have unexplained weight loss.  You have night sweats. SEEK IMMEDIATE MEDICAL CARE IF:  You cough up blood.  You have difficulty breathing.  Your heartbeat is very fast.   This information is not intended to replace advice given to you by your health care provider. Make sure you discuss any questions you have with your health care provider.   Document Released: 03/31/2011 Document Revised: 06/23/2015 Document Reviewed: 12/09/2014 Elsevier Interactive Patient Education 2016 Elsevier Inc. Chest Wall Pain Chest wall pain is pain in or around the bones and muscles of your chest. Sometimes, an injury causes this pain. Sometimes, the cause may not be known. This pain may take several weeks or longer to get better. HOME CARE INSTRUCTIONS  Pay attention to any changes in your symptoms. Take these actions to help with your pain:   Rest  as told by your health care provider.   Avoid activities that cause pain. These include any activities that use your chest muscles or your abdominal and side muscles to lift heavy items.   If directed, apply ice to the painful area:  Put ice in a plastic bag.  Place a towel between your skin and the bag.  Leave the ice on for 20 minutes, 2-3 times per day.  Take over-the-counter and prescription medicines only as told by your health care provider.  Do not use tobacco products, including cigarettes,  chewing tobacco, and e-cigarettes. If you need help quitting, ask your health care provider.  Keep all follow-up visits as told by your health care provider. This is important. SEEK MEDICAL CARE IF:  You have a fever.  Your chest pain becomes worse.  You have new symptoms. SEEK IMMEDIATE MEDICAL CARE IF:  You have nausea or vomiting.  You feel sweaty or light-headed.  You have a cough with phlegm (sputum) or you cough up blood.  You develop shortness of breath.   This information is not intended to replace advice given to you by your health care provider. Make sure you discuss any questions you have with your health care provider.   Document Released: 10/02/2005 Document Revised: 06/23/2015 Document Reviewed: 12/28/2014 Elsevier Interactive Patient Education [employer]

## 2015-10-19 ENCOUNTER — Telehealth: Payer: Self-pay

## 2015-10-19 DIAGNOSIS — G451 Carotid artery syndrome (hemispheric): Secondary | ICD-10-CM

## 2015-10-19 NOTE — Telephone Encounter (Signed)
-----   Message from Runell GessJonathan J Berry, MD sent at 10/19/2015  1:26 PM EST ----- No change from prior study. Repeat in 12 months.

## 2015-10-20 ENCOUNTER — Telehealth: Payer: Self-pay

## 2015-10-20 MED ORDER — ALBUTEROL SULFATE HFA 108 (90 BASE) MCG/ACT IN AERS: 1.0000 | INHALATION_SPRAY | RESPIRATORY_TRACT | Status: DC | PRN

## 2015-10-20 NOTE — Telephone Encounter (Signed)
I did not send it in as she declined any inhalers at that OV, unless I misunderstood her. See last OV. I will send in albuterol, but if frequent use or persistent need more than few days - RTC for eval.

## 2015-10-20 NOTE — Telephone Encounter (Signed)
Patient is calling because she doesn't understand why her medication has not been sent to the pharmacy yet. Patient states that she was recently here for an office visit and the medication was supposed to have been send that day. I informed patient that the message has been acknowledged and that we need an okay before the medication gets sent to the pharmacy

## 2015-10-20 NOTE — Telephone Encounter (Signed)
Dr Neva SeatGreene   REQUEST REFILL OF abuterol.  Stated you offered to fill it at her last OV.    Pharmacy: [id number] - [place], [place]   [phone]

## 2015-10-20 NOTE — Telephone Encounter (Signed)
Dr. Neva SeatGreene  can we send in?

## 2015-10-21 NOTE — Telephone Encounter (Signed)
Pt advised.

## 2015-11-05 ENCOUNTER — Other Ambulatory Visit: Payer: Self-pay | Admitting: Family Medicine

## 2015-11-08 ENCOUNTER — Other Ambulatory Visit: Payer: Self-pay | Admitting: Family Medicine

## 2015-11-15 ENCOUNTER — Other Ambulatory Visit: Payer: Self-pay | Admitting: Family Medicine

## 2015-11-16 ENCOUNTER — Ambulatory Visit (INDEPENDENT_AMBULATORY_CARE_PROVIDER_SITE_OTHER): Payer: BLUE CROSS/BLUE SHIELD | Admitting: Internal Medicine

## 2015-11-16 ENCOUNTER — Encounter: Payer: Self-pay | Admitting: Internal Medicine

## 2015-11-16 ENCOUNTER — Telehealth: Payer: Self-pay

## 2015-11-16 VITALS — BP 124/76 | HR 72 | Ht 64.5 in | Wt 190.8 lb

## 2015-11-16 DIAGNOSIS — M791 Myalgia: Secondary | ICD-10-CM

## 2015-11-16 DIAGNOSIS — R0781 Pleurodynia: Secondary | ICD-10-CM

## 2015-11-16 DIAGNOSIS — R0789 Other chest pain: Secondary | ICD-10-CM

## 2015-11-16 DIAGNOSIS — M7918 Myalgia, other site: Secondary | ICD-10-CM

## 2015-11-16 NOTE — Progress Notes (Signed)
HISTORY OF PRESENT ILLNESS:  Bethany Clarke is a 60 y.o. female who was initially evaluated October 2013 for abdominal pain with nausea vomiting and loose stools. Upper endoscopy with gastroduodenitis and H. pylori for which she was treated. Benign biliary ductal dilation evaluated with MRCP and normal liver tests. Gallbladder intact. Subsequently evaluated March 2014 for right upper quadrant pain. No worrisome features. Normal liver tests. Reevaluated 10/01/2015 for sharp right upper quadrant pain affected by movements and body position. Was placed on a short course of prednisone with transient improvement. Seen by her cardiologist does not feel that her pain is cardiac. Presents today for follow-up. Patient reports having had a few other episodes of severe right upper quadrant pain. Describes it as sharp knifelike. Again, affected by certain movements or body position. No shortness of breath or other symptoms. No cough.  REVIEW OF SYSTEMS:  All non-GI ROS negative upon review  Past Medical History  Diagnosis Date  . GERD (gastroesophageal reflux disease)   . Hypothyroid   . Dizziness   . Syncope, near   . Heart burn   . H. pylori infection   . Atherosclerosis 11/14/2005    carotid US - mild calcific non-stenotic plaque bilaterally  . Carotid bruit 02/05/2009    Doppler - R/L ICAs 0-49% diameter reductin (velocities suggest low-mid range); L subclavian 0-49%  diameter reduction  . Lipidemia   . Emphysema of lung (HCC)   . Pneumonia ~ 2009  . Chronic bronchitis (HCC)     "growing up; not anymore" (03/14/2013)  . Subclavian artery stenosis, left     subclavian steal physiology status post PTA and stenting  . Costochondritis   . Colon polyp     hyperplastic  . Hemorrhoids     Past Surgical History  Procedure Laterality Date  . Abdominal hysterectomy  1999  . Subclavian artery stent Left 03/14/2013     for subclavian steal/claudication (03/14/2013)  . Dilation and curettage of  uterus  1980's  . Tubal ligation  1980's  . Breast biopsy Right 1990's    "benign" (03/14/2013)  . Cardiac catheterization  03/03/2013  . Left heart catheterization with coronary angiogram Bilateral 03/03/2013    Procedure: LEFT HEART CATHETERIZATION WITH CORONARY ANGIOGRAM;  Surgeon: Lennette Bihari, MD;  Location: [facility] CATH LAB;  Service: Cardiovascular;  Laterality: Bilateral;  . Bilateral upper extremity angiogram N/A 03/14/2013    Procedure: BILATERAL UPPER EXTREMITY ANGIOGRAM;  Surgeon: [name], MD;  Location: [facility] CATH LAB;  Service: Cardiovascular;  Laterality: N/A;  . Percutaneous stent intervention  03/14/2013    Procedure: PERCUTANEOUS STENT INTERVENTION;  Surgeon: [name], MD;  Location: [facility] CATH LAB;  Service: Cardiovascular;;    Social History Bethany Clarke  reports that she quit smoking about 2 years ago. Her smoking use included Cigarettes. She has a 20 pack-year smoking history. She has never used smokeless tobacco. She reports that she does not drink alcohol or use illicit drugs.  family history includes Arthritis in her sister and sister; COPD in her brother and sister; Cancer in her brother and father; Diabetes in her mother, sister, and sister; Heart disease in her mother and sister; Hyperlipidemia in her brother and sister; Hypertension in her brother, brother, father, and sister; Rheum arthritis in her brother; Stroke in her mother. There is no history of Colon cancer.  No Known Allergies     PHYSICAL EXAMINATION: Vital signs: BP 124/76 mmHg  Pulse 72  Ht 5' 4.5" (1.638 m)  Wt 190 lb 12.8 oz (86.546 kg)  BMI 32.26 kg/m2 General: Well-developed, well-nourished, no acute distress HEENT: Sclerae are anicteric, conjunctiva pink. Oral mucosa intact Lungs: Clear CHEST: Severe tenderness to palpation midline right anterior chest inferiorly at the lower ribs. This reproduces her pain unequivocally Heart: Regular Abdomen: soft, nontender, nondistended, no  obvious ascites, no peritoneal signs, normal bowel sounds. No organomegaly. Extremities: No clubbing, cyanosis or edema Psychiatric: alert and oriented x3. Cooperative     ASSESSMENT:  #1. Musculoskeletal chest pain/right upper quadrant pain   PLAN:  #1. Reassurance #2. Return to the care of your primary care provider regarding any additional evaluation and treatment. #3 per GI follow-up as needed

## 2015-11-16 NOTE — Patient Instructions (Signed)
Please follow up as needed 

## 2015-11-16 NOTE — Telephone Encounter (Signed)
Patient would like to see Dr. Neva Seat for a follow up on muscular skeletal issues. She was told that she needs to follow up within a week but the only appointments open are same day. Patient would like to know if there's any way possible she can be squeezed in soon.

## 2015-11-24 NOTE — Telephone Encounter (Signed)
Pt is in a really bad mood because Dr Neva Seat didn't have any appts for her and even though she was given his walk in hours, that did not satisfy her either, would like a call back from Dr Neva Seat to [phone] States she have been calling 4 times for a call back to no avail.

## 2015-11-26 NOTE — Telephone Encounter (Signed)
I have scheduled an appt for Pt on 12/01/2015 at 4:00 with Dr. Neva Seat.   LVM to tell patient date and time, if she needed to cancel to call us back.

## 2015-12-01 ENCOUNTER — Ambulatory Visit (INDEPENDENT_AMBULATORY_CARE_PROVIDER_SITE_OTHER): Payer: BLUE CROSS/BLUE SHIELD | Admitting: Family Medicine

## 2015-12-01 ENCOUNTER — Encounter: Payer: Self-pay | Admitting: Family Medicine

## 2015-12-01 VITALS — BP 110/74 | HR 69 | Temp 98.2°F | Resp 16 | Ht 64.75 in | Wt 189.2 lb

## 2015-12-01 DIAGNOSIS — Z23 Encounter for immunization: Secondary | ICD-10-CM

## 2015-12-01 DIAGNOSIS — E039 Hypothyroidism, unspecified: Secondary | ICD-10-CM | POA: Diagnosis not present

## 2015-12-01 DIAGNOSIS — R0789 Other chest pain: Secondary | ICD-10-CM

## 2015-12-01 DIAGNOSIS — E785 Hyperlipidemia, unspecified: Secondary | ICD-10-CM | POA: Diagnosis not present

## 2015-12-01 DIAGNOSIS — Z1239 Encounter for other screening for malignant neoplasm of breast: Secondary | ICD-10-CM | POA: Diagnosis not present

## 2015-12-01 NOTE — Progress Notes (Signed)
Subjective:    Patient ID: Bethany Clarke, female    DOB: Feb 19, 1956, 60 y.o.   MRN: [id number] By signing my name below, I, Littie Deeds, attest that this documentation has been prepared under the direction and in the presence of Meredith Staggers, MD.  Electronically Signed: Littie Deeds, Medical Scribe. 12/01/2015. 4:50 PM.  HPI HPI Comments: Bethany Clarke is a 60 y.o. female who presents to the [facility] for a follow-up. Patient last ate some peanut butter crackers around 11:00 AM today.  Hyperlipidemia: She takes Lipitor 40 mg qd and Plavix 75 mg qd. With history of subclavian artery stenosis. She has not had any testing elsewhere. Lab Results  Component Value Date   CHOL 147 04/12/2015   HDL 57 04/12/2015   LDLCALC 42 04/12/2015   TRIG 238* 04/12/2015   CHOLHDL 2.6 04/12/2015    Lab Results  Component Value Date   ALT 31 10/01/2015   AST 18 10/01/2015   ALKPHOS 75 10/01/2015   BILITOT 0.3 10/01/2015    Hypothyroidism:  Lab Results  Component Value Date   TSH 1.707 04/12/2015    Right-sided rib pain: She was seen by Dr. Marina Goodell, GI. Had been seen December 16th for RUQ pain, worsened by movement. Improved with prednisone. Saw me on December 31st. We increased prednisone to 40 mg a day, then tapered down. The increased dose of prednisone did not change her symptoms. Suspected costochondritis. When she was recently seen by Dr. Marina Goodell, endorsed a few other episodes of RUQ pain. Thought it was musculoskeletal pain and reassured her. She has also seen 2 cardiologists who believed the pain was musculoskeletal in nature. She had a CXR on December 31st which showed no active cardiopulmonary disease. Of note, she had been recently treated for a suspected bronchitis or COPD flare prior to the chest wall pain. She has had the chest wall pain intermittently for years, but this worsened recently and is now persistently painful. Patient locates the pain to underneath the  breast and notes the area is tender to the touch. She has been taking Tylenol for the pain which provides some relief; she does not want any pain medications. She has SOB at baseline and uses 2 puffs of albuterol, every 2-3 days. She had a CT of her chest in April 2010, which showed no suspicious pulmonary nodules, stable pleural parenchymal thickening of the lingula, and mild coronary calcification. She had a negative mammogram in 2010. She also had a negative rib series in December last year. Patient denies calf/leg swelling, night sweats and unexplained weight loss. She also denies history of PE, DVT, and breast cancer. No FMHx of breast cancer per patient.  Patient Active Problem List   Diagnosis Date Noted  . Paresthesia 07/14/2013  . Bilateral foot pain 05/01/2013  . Subclavian artery stenosis, left   . Subclavian steal syndrome 03/16/2013  . Subclavian arterial stenosis (HCC) 03/11/2013  . Carotid stenosis 03/11/2013  . Hyperlipidemia LDL goal < 70 03/11/2013  . GERD (gastroesophageal reflux disease)   . Hypothyroid   . Dizziness   . Syncope, near   . Heart burn   . Hypothyroidism 05/28/2007  . COPD 05/28/2007   Past Medical History  Diagnosis Date  . GERD (gastroesophageal reflux disease)   . Hypothyroid   . Dizziness   . Syncope, near   . Heart burn   . H. pylori infection   . Atherosclerosis 11/14/2005    carotid US - mild calcific  non-stenotic plaque bilaterally  . Carotid bruit 02/05/2009    Doppler - R/L ICAs 0-49% diameter reductin (velocities suggest low-mid range); L subclavian 0-49%  diameter reduction  . Lipidemia   . Emphysema of lung (HCC)   . Pneumonia ~ 2009  . Chronic bronchitis (HCC)     "growing up; not anymore" (03/14/2013)  . Subclavian artery stenosis, left     subclavian steal physiology status post PTA and stenting  . Costochondritis   . Colon polyp     hyperplastic  . Hemorrhoids    Past Surgical History  Procedure Laterality Date  . Abdominal  hysterectomy  1999  . Subclavian artery stent Left 03/14/2013     for subclavian steal/claudication (03/14/2013)  . Dilation and curettage of uterus  1980's  . Tubal ligation  1980's  . Breast biopsy Right 1990's    "benign" (03/14/2013)  . Cardiac catheterization  03/03/2013  . Left heart catheterization with coronary angiogram Bilateral 03/03/2013    Procedure: LEFT HEART CATHETERIZATION WITH CORONARY ANGIOGRAM;  Surgeon: Lennette Bihari, MD;  Location: [facility] CATH LAB;  Service: Cardiovascular;  Laterality: Bilateral;  . Bilateral upper extremity angiogram N/A 03/14/2013    Procedure: BILATERAL UPPER EXTREMITY ANGIOGRAM;  Surgeon: [name], MD;  Location: [facility] CATH LAB;  Service: Cardiovascular;  Laterality: N/A;  . Percutaneous stent intervention  03/14/2013    Procedure: PERCUTANEOUS STENT INTERVENTION;  Surgeon: [name], MD;  Location: [facility] CATH LAB;  Service: Cardiovascular;;   No Known Allergies Prior to Admission medications   Medication Sig Start Date End Date Taking? Authorizing Provider  albuterol (PROVENTIL HFA;VENTOLIN HFA) 108 (90 Base) MCG/ACT inhaler Inhale 1-2 puffs into the lungs every 4 (four) hours as needed for wheezing or shortness of breath. 10/20/15  Yes [name], MD  aspirin 81 MG tablet Take 81 mg by mouth daily.   Yes Historical Provider, MD  atorvastatin (LIPITOR) 40 MG tablet TAKE 1 TABLET BY MOUTH EVERY DAY "OFFICE VISIT NEEDED FOR REFILLS" 11/15/15  Yes [name], MD  clopidogrel (PLAVIX) 75 MG tablet Take 1 tablet (75 mg total) by mouth daily. 07/13/15  Yes Lennette Bihari, MD  levothyroxine (SYNTHROID) 100 MCG tablet Take 1 tablet by mouth daily before breakfast 11/07/15  Yes [name], MD   Social History   Social History  . Marital Status: Married    Spouse Name: N/A  . Number of Children: 3  . Years of Education: N/A   Occupational History  . 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 Smoking status: Former Smoker -- 0.50  packs/day for 40 years    Types: Cigarettes    Quit date: 02/11/2013  . Smokeless tobacco: Never Used  . Alcohol Use: No  . Drug Use: No  . Sexual Activity: Yes     Comment: number of sex partners in the last 12 months  1   Other Topics Concern  . Not on file   Social History Narrative   Daily caffeine. Married. Education: [employer]. Exercise: No.     Review of Systems  Constitutional: Negative for diaphoresis and unexpected weight change.  Respiratory: Positive for shortness of breath.   Cardiovascular: Positive for chest pain. Negative for leg swelling.  Skin: Negative for color change and rash.       Objective:   Physical Exam  Constitutional: She is oriented to person, place, and time. She appears well-developed and well-nourished. No distress.  HENT:  Head:  Normocephalic and atraumatic.  Mouth/Throat: Oropharynx is clear and moist. No oropharyngeal exudate.  Eyes: Pupils are equal, round, and reactive to light.  Neck: Neck supple.  Cardiovascular: Normal rate.   Pulmonary/Chest: Effort normal. She exhibits tenderness.  Tenderness over anterior axillary to the mid clavicular line, mid to lower ribs on the right anteriorly.  Abdominal: Soft. She exhibits no distension. There is no tenderness. There is negative Murphy's sign.  Genitourinary:  Tenderness along inferior aspect of right breast and along chest wall below the breast.  Slight tenderness along inferior aspect of left breast and along chest wall below the breast. No skin changes. No nipple discharge. No blisters. No nodules.  Musculoskeletal: She exhibits no edema.  Lymphadenopathy:  No lymphadenopathy axilla.  Neurological: She is alert and oriented to person, place, and time. No cranial nerve deficit.  Skin: Skin is warm and dry. No rash noted.  Psychiatric: She has a normal mood and affect. Her behavior is normal.  Nursing note and vitals reviewed.   Filed Vitals:   12/01/15 1620  BP: 110/74  Pulse:  69  Temp: 98.2 F (36.8 C)  TempSrc: Oral  Resp: 16  Height: 5' 4.75" (1.645 m)  Weight: 189 lb 3.2 oz (85.821 kg)  SpO2: 95%         Assessment & Plan:  KETSIA LINEBAUGH is a 60 y.o. female Flu vaccine need - Plan: Flu Vaccine QUAD 36+ mos IM given  Right-sided chest wall pain  -Long-standing with initial suspected chest wall/costochondritic pain. Recent worsening. History of tobacco abuse. No known risk factors for DVT/PE, but with persistent now worsening pain, and underlying tobacco use, will check CT of chest. Also with discomfort at the lower aspect of her breast bilaterally right greater left, will check screening mammogram. RTC/ER precautions if acute worsening. She declined stronger pain medication at this time, so can continue Tylenol for now, call if stronger medicine needed.  Hyperlipidemia - Plan: COMPLETE METABOLIC PANEL WITH GFR, Lipid panel  -Labs pending. Continue Lipitor 40 mg daily.  Hypothyroidism, unspecified hypothyroidism type - Plan: TSH  - TSH pending. Continue same dose of Synthroid for now.  Breast cancer screening - Plan: MM Digital Screening ordered.   No orders of the defined types were placed in this encounter.   Patient Instructions  We will schedule ct scan and mammogram, tylenol as needed. Call me if stronger med needed.  Return to the clinic or go to the nearest emergency room if any of your symptoms worsen or new symptoms occur.  You should receive a call or letter about your lab results within the next week to 10 days.      I personally performed the services described in this documentation, which was scribed in my presence. The recorded information has been reviewed and considered, and addended by me as needed.

## 2015-12-01 NOTE — Patient Instructions (Signed)
We will schedule ct scan and mammogram, tylenol as needed. Call me if stronger med needed.  Return to the clinic or go to the nearest emergency room if any of your symptoms worsen or new symptoms occur.  You should receive a call or letter about your lab results within the next week to 10 days.

## 2015-12-02 LAB — COMPLETE METABOLIC PANEL WITH GFR
ALBUMIN: 4.6 g/dL (ref 3.6–5.1)
ALK PHOS: 62 U/L (ref 33–130)
ALT: 44 U/L — ABNORMAL HIGH (ref 6–29)
AST: 24 U/L (ref 10–35)
BILIRUBIN TOTAL: 0.3 mg/dL (ref 0.2–1.2)
BUN: 7 mg/dL (ref 7–25)
CALCIUM: 9.9 mg/dL (ref 8.6–10.4)
CO2: 26 mmol/L (ref 20–31)
Chloride: 101 mmol/L (ref 98–110)
Creat: 0.68 mg/dL (ref 0.50–1.05)
GFR, Est African American: >89 mL/min (ref >=60)
GLUCOSE: 90 mg/dL (ref 65–99)
POTASSIUM: 3.8 mmol/L (ref 3.5–5.3)
Sodium: 138 mmol/L (ref 135–146)
TOTAL PROTEIN: 7.6 g/dL (ref 6.1–8.1)

## 2015-12-02 LAB — LIPID PANEL
CHOL/HDL RATIO: 2.5 ratio (ref <=5.0)
CHOLESTEROL: 148 mg/dL (ref 125–200)
HDL: 59 mg/dL (ref >=46)
LDL Cholesterol: 62 mg/dL (ref <130)
Triglycerides: 133 mg/dL (ref <150)
VLDL: 27 mg/dL (ref <30)

## 2015-12-02 LAB — TSH: TSH: 3.02 m[IU]/L

## 2015-12-06 ENCOUNTER — Other Ambulatory Visit: Payer: Self-pay | Admitting: Family Medicine

## 2015-12-06 DIAGNOSIS — N644 Mastodynia: Secondary | ICD-10-CM

## 2015-12-06 DIAGNOSIS — R0789 Other chest pain: Secondary | ICD-10-CM

## 2015-12-08 ENCOUNTER — Other Ambulatory Visit: Payer: Self-pay | Admitting: Family Medicine

## 2015-12-08 ENCOUNTER — Telehealth: Payer: Self-pay

## 2015-12-08 DIAGNOSIS — R079 Chest pain, unspecified: Secondary | ICD-10-CM

## 2015-12-08 DIAGNOSIS — N644 Mastodynia: Secondary | ICD-10-CM

## 2015-12-08 DIAGNOSIS — R0789 Other chest pain: Secondary | ICD-10-CM

## 2015-12-08 NOTE — Telephone Encounter (Signed)
I do not know what happened with the CT chest order - I thought this was placed at our last visit. Please apologize for this delay from me. I placed order today. Not sure what "tomo" order is.  I thought I ordered the tests the way they instructed by staff message.  Please change to whatever order they need.   Thanks. -JG.

## 2015-12-08 NOTE — Telephone Encounter (Signed)
Patient called to inquire about the status of the orders placed at her last OV with Dr Neva Seat.  A CT scan was supposed to have been placed, but I do not see it in the orders tab on the patient's chart.  Please place the necessary order.  Also, [facility] sent a message back stating that the mammogram/ultrasound orders need to be changed to a "tomo."  Please change the order as needed.  The patient has called and is very upset that she cannot get scheduled yet.  CB#: [phone]

## 2015-12-10 NOTE — Telephone Encounter (Signed)
Order for MMG with tomo ordered. CT ordered.

## 2015-12-12 ENCOUNTER — Other Ambulatory Visit: Payer: Self-pay | Admitting: Family Medicine

## 2015-12-13 ENCOUNTER — Ambulatory Visit (INDEPENDENT_AMBULATORY_CARE_PROVIDER_SITE_OTHER): Payer: BLUE CROSS/BLUE SHIELD

## 2015-12-13 ENCOUNTER — Ambulatory Visit (INDEPENDENT_AMBULATORY_CARE_PROVIDER_SITE_OTHER): Payer: BLUE CROSS/BLUE SHIELD | Admitting: Family Medicine

## 2015-12-13 VITALS — BP 124/76 | HR 82 | Temp 98.5°F | Resp 16

## 2015-12-13 DIAGNOSIS — R0789 Other chest pain: Secondary | ICD-10-CM

## 2015-12-13 DIAGNOSIS — R6883 Chills (without fever): Secondary | ICD-10-CM

## 2015-12-13 DIAGNOSIS — R05 Cough: Secondary | ICD-10-CM

## 2015-12-13 DIAGNOSIS — J441 Chronic obstructive pulmonary disease with (acute) exacerbation: Secondary | ICD-10-CM

## 2015-12-13 DIAGNOSIS — R059 Cough, unspecified: Secondary | ICD-10-CM

## 2015-12-13 DIAGNOSIS — E038 Other specified hypothyroidism: Secondary | ICD-10-CM

## 2015-12-13 DIAGNOSIS — R070 Pain in throat: Secondary | ICD-10-CM

## 2015-12-13 DIAGNOSIS — R5383 Other fatigue: Secondary | ICD-10-CM | POA: Diagnosis not present

## 2015-12-13 DIAGNOSIS — E785 Hyperlipidemia, unspecified: Secondary | ICD-10-CM | POA: Diagnosis not present

## 2015-12-13 LAB — POCT INFLUENZA A/B
Influenza A, POC: NEGATIVE
Influenza B, POC: NEGATIVE

## 2015-12-13 MED ORDER — LEVOTHYROXINE SODIUM 100 MCG PO TABS: ORAL_TABLET | ORAL | Status: DC

## 2015-12-13 MED ORDER — PREDNISONE 20 MG PO TABS: 40.0000 mg | ORAL_TABLET | Freq: Every day | ORAL | Status: DC

## 2015-12-13 MED ORDER — HYDROCODONE-ACETAMINOPHEN 5-325 MG PO TABS: 1.0000 | ORAL_TABLET | Freq: Four times a day (QID) | ORAL | Status: DC | PRN

## 2015-12-13 MED ORDER — ATORVASTATIN CALCIUM 40 MG PO TABS: ORAL_TABLET | ORAL | Status: DC

## 2015-12-13 NOTE — Progress Notes (Signed)
Subjective:  By signing my name below, I, [name], attest that this documentation has been prepared under the direction and in the presence of Meredith Staggers, MD. Electronically Signed: [name], Scribe. 12/13/2015 , 4:11 PM .  Patient was seen in Room 1 .   Patient ID: Bethany Clarke, female    DOB: Sep 05, 1956, 60 y.o.   MRN: [id number] Chief Complaint  Patient presents with  . Follow-up    cough, leading to chest pain   HPI Bethany Clarke is a 60 y.o. female Here for follow up.   Cough She is a former smoker with history of COPD. See prior visits regarding her COPD. Gold category D based on past evaluation by pulmonary, but declined daily treatments. Only uses albuterol as needed. She's used it for relief 4 times 2 days ago because she had shortness of breath. She has persistent coughs and worsened in last 4 days. Her coughs have caused some back pain. She felt chills with sore throat, rhinorrhea, and ear pain started yesterday. She received a flu shot this year. She's taken tessalon perles without relief. She denies headache. She denies any known sick contact.   Chest Wall Pain She was seen most recently for right sided chest wall pain. She had been seen by GI and then myself in December. She reported some improvement with prednisone treatment which had been prescribed on Dec 17 for suspected chest wall/MSK pain or costochondritis. She had some cough when I saw her in Dec 31st, thought to have COPD flare up. She had already been treated with zpak and increased prednisone to 40mg  for 3 days then tapered over 6 days.  She was seen again on Jan 31st by Dr. Dorene Sorrow. Noted that she had benign biliary ductal dilation, normal liver test. This was performed in the past. Again, thought this to be musculoskeletal pain, asked to follow up with me. I saw her 12 days ago. At that time, she was endorsing some pain in lower breast, right greater than left, and persistent right sided chest  pain. CT of chest was ordered as well as mammogram and ultrasound of the breast. Declined any stronger medications last visit. She is still experiencing some soreness in her breast. She denies nausea, vomiting, appetite loss, or blood in stool.   Bloodwork + TSH She needs to get synthroid refilled.  She had bloodwork done on 12/01/15. Her TSH was 3.02, which was normal. Her cholesterol was also normal.   Patient Active Problem List   Diagnosis Date Noted  . Paresthesia 07/14/2013  . Bilateral foot pain 05/01/2013  . Subclavian artery stenosis, left   . Subclavian steal syndrome 03/16/2013  . Subclavian arterial stenosis (HCC) 03/11/2013  . Carotid stenosis 03/11/2013  . Hyperlipidemia LDL goal < 70 03/11/2013  . GERD (gastroesophageal reflux disease)   . Hypothyroid   . Dizziness   . Syncope, near   . Heart burn   . Hypothyroidism 05/28/2007  . COPD 05/28/2007   Past Medical History  Diagnosis Date  . GERD (gastroesophageal reflux disease)   . Hypothyroid   . Dizziness   . Syncope, near   . Heart burn   . H. pylori infection   . Atherosclerosis 11/14/2005    carotid US - mild calcific non-stenotic plaque bilaterally  . Carotid bruit 02/05/2009    Doppler - R/L ICAs 0-49% diameter reductin (velocities suggest low-mid range); L subclavian 0-49%  diameter reduction  . Lipidemia   . Emphysema of lung (HCC)   .  Pneumonia ~ 2009  . Chronic bronchitis (HCC)     "growing up; not anymore" (03/14/2013)  . Subclavian artery stenosis, left     subclavian steal physiology status post PTA and stenting  . Costochondritis   . Colon polyp     hyperplastic  . Hemorrhoids    Past Surgical History  Procedure Laterality Date  . Abdominal hysterectomy  1999  . Subclavian artery stent Left 03/14/2013     for subclavian steal/claudication (03/14/2013)  . Dilation and curettage of uterus  1980's  . Tubal ligation  1980's  . Breast biopsy Right 1990's    "benign" (03/14/2013)  . Cardiac  catheterization  03/03/2013  . Left heart catheterization with coronary angiogram Bilateral 03/03/2013    Procedure: LEFT HEART CATHETERIZATION WITH CORONARY ANGIOGRAM;  Surgeon: Lennette Bihari, MD;  Location: [facility] CATH LAB;  Service: Cardiovascular;  Laterality: Bilateral;  . Bilateral upper extremity angiogram N/A 03/14/2013    Procedure: BILATERAL UPPER EXTREMITY ANGIOGRAM;  Surgeon: [name], MD;  Location[facility] CATH LAB;  Service: Cardiovascular;  Laterality: N/A;  . Percutaneous stent intervention  03/14/2013    Procedure: PERCUTANEOUS STENT INTERVENTION;  Surgeon: [name], MD;  Location: [facility] CATH LAB;  Service: Cardiovascular;;   No Known Allergies Prior to Admission medications   Medication Sig Start Date End Date Taking? Authorizing Provider  albuterol (PROVENTIL HFA;VENTOLIN HFA) 108 (90 Base) MCG/ACT inhaler Inhale 1-2 puffs into the lungs every 4 (four) hours as needed for wheezing or shortness of breath. 10/20/15  Yes [name], MD  aspirin 81 MG tablet Take 81 mg by mouth daily.   Yes Historical Provider, MD  atorvastatin (LIPITOR) 40 MG tablet TAKE 1 TABLET BY MOUTH EVERY DAY "OFFICE VISIT NEEDED FOR REFILLS" 11/15/15  Yes [name], MD  clopidogrel (PLAVIX) 75 MG tablet Take 1 tablet (75 mg total) by mouth daily. 07/13/15  Yes Lennette Bihari, MD  SYNTHROID 100 MCG tablet TAKE 1 TABLET BY MOUTH DAILY BEFORE BREAKFAST 12/13/15  Yes [name], MD   Social History   Social History  . Marital Status: Married    Spouse Name: N/A  . Number of Children: 3  . Years of Education: N/A   Occupational History  . 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 Smoking status: Former Smoker -- 0.50 packs/day for 40 years    Types: Cigarettes    Quit date: 02/11/2013  . Smokeless tobacco: Never Used  . Alcohol Use: No  . Drug Use: No  . Sexual Activity: Yes     Comment: number of sex partners in the last 12 months  1   Other Topics Concern  . Not on file     Social History Narrative   Daily caffeine. Married. Education: [employer]. Exercise: No.   Review of Systems  Constitutional: Positive for chills and fatigue. Negative for fever and appetite change.  HENT: Positive for ear pain, rhinorrhea and sore throat.   Respiratory: Positive for cough and chest tightness. Negative for shortness of breath and wheezing.   Cardiovascular: Positive for chest pain.  Gastrointestinal: Negative for nausea, vomiting, diarrhea, constipation and blood in stool.  Musculoskeletal: Positive for back pain.      Objective:   Physical Exam  Constitutional: She is oriented to person, place, and time. She appears well-developed and well-nourished. No distress.  HENT:  Head: Normocephalic and atraumatic.  Right Ear: Hearing, tympanic membrane, external ear and ear canal normal.  Left Ear: Hearing, tympanic membrane, external ear and ear canal normal.  Nose: Rhinorrhea present. Right sinus exhibits no maxillary sinus tenderness and no frontal sinus tenderness. Left sinus exhibits no maxillary sinus tenderness and no frontal sinus tenderness.  Mouth/Throat: Oropharynx is clear and moist and mucous membranes are normal. No oropharyngeal exudate.  Eyes: Conjunctivae and EOM are normal. Pupils are equal, round, and reactive to light.  Cardiovascular: Normal rate, regular rhythm, normal heart sounds and intact distal pulses.   No murmur heard. Pulmonary/Chest: Effort normal and breath sounds normal. No respiratory distress. She has no wheezes. She has no rhonchi.  Tender diffusely over anterior lower chest wall, no crepitance   Neurological: She is alert and oriented to person, place, and time.  Skin: Skin is warm and dry. No rash noted.  Psychiatric: She has a normal mood and affect. Her behavior is normal.  Vitals reviewed.   Filed Vitals:   12/13/15 1430  BP: 124/76  Pulse: 82  Temp: 98.5 F (36.9 C)  Resp: 16  SpO2: 96%   Results for orders placed or  performed in visit on 12/13/15  POCT Influenza A/B  Result Value Ref Range   Influenza A, POC Negative Negative   Influenza B, POC Negative Negative   Dg Chest 1 View  12/13/2015  CLINICAL DATA:  60 year old female with right-sided chest wall pain. EXAM: RIGHT RIBS AND CHEST - 3+ VIEW; CHEST  1 VIEW COMPARISON:  Chest x-ray 10/16/2015. FINDINGS: Lung volumes are normal. No consolidative airspace disease. No pleural effusions. No pneumothorax. No pulmonary nodule or mass noted. Pulmonary vasculature and the cardiomediastinal silhouette are within normal limits. Atherosclerosis in the thoracic aorta. Stent noted, possibly in the left proximal common carotid artery or left subclavian artery. Dedicated views of the right ribs demonstrate no acute displaced right-sided rib fractures. IMPRESSION: 1. No acute displaced right-sided rib fractures. 2. No radiographic evidence of acute cardiopulmonary disease. 3. Atherosclerosis. Electronically Signed   By: Trudie Reed M.D.   On: 12/13/2015 16:44   Dg Ribs Unilateral W/chest Right  12/13/2015  CLINICAL DATA:  60 year old female with right-sided chest wall pain. EXAM: RIGHT RIBS AND CHEST - 3+ VIEW; CHEST  1 VIEW COMPARISON:  Chest x-ray 10/16/2015. FINDINGS: Lung volumes are normal. No consolidative airspace disease. No pleural effusions. No pneumothorax. No pulmonary nodule or mass noted. Pulmonary vasculature and the cardiomediastinal silhouette are within normal limits. Atherosclerosis in the thoracic aorta. Stent noted, possibly in the left proximal common carotid artery or left subclavian artery. Dedicated views of the right ribs demonstrate no acute displaced right-sided rib fractures. IMPRESSION: 1. No acute displaced right-sided rib fractures. 2. No radiographic evidence of acute cardiopulmonary disease. 3. Atherosclerosis. Electronically Signed   By: Trudie Reed M.D.   On: 12/13/2015 16:44       Assessment & Plan:   JANNELY HENTHORN is a 60  y.o. female Cough - Plan: POCT Influenza A/B, DG Chest 1 View, Ambulatory referral to Pulmonology COPD exacerbation (HCC) - Plan: DG Chest 1 View, predniSONE (DELTASONE) 20 MG tablet  - longstanding cough with COPD, only inhaler treatment of albuterol as other inhaled therapies declined.   -Recent increase in cough suspected COPD exacerbation. No productive phlegm, no fever, doubt bacterial infection - abx deferred.    -Prednisone 40 mg daily 5 days, continue albuterol, RTC precautions if not improving the next few days. Sooner if worse.  Right-sided chest wall pain - Plan: DG Ribs Unilateral W/Chest Right, HYDROcodone-acetaminophen (NORCO/VICODIN) 5-325 MG  tablet, Ambulatory referral to Pulmonology  -Still reproducible on exam. Still appears to be costochondritis/musculoskeletal pain and persistent cough likely contributory.  -CT scan to be order the next few days, patient to call to schedule and mammogram/ultrasound is also pending.  -Refer to pulmonary for other pending and to discuss cough.  -Can also call her cardiologist to evaluate with this persistent chest pain, but appears to be again reproducible and chest wall pain.  -ER/RTC precautions discussed.  - lortab 5/325mg  Rx - SED  Chills - Plan: POCT Influenza A/B, DG Chest 1 View  Possible viral illness with COPD flare as above. RTC precautions if febrile or worsening.  Hyperlipidemia - Plan: atorvastatin (LIPITOR) 40 MG tablet  Continue Lipitor 40 mg daily.  Other specified hypothyroidism - Plan: levothyroxine (SYNTHROID) 100 MCG tablet  -TSH recently controlled. Refilled Synthroid 6 months.  Meds ordered this encounter  Medications  . predniSONE (DELTASONE) 20 MG tablet    Sig: Take 2 tablets (40 mg total) by mouth daily with breakfast.    Dispense:  10 tablet    Refill:  0  . HYDROcodone-acetaminophen (NORCO/VICODIN) 5-325 MG tablet    Sig: Take 1 tablet by mouth every 6 (six) hours as needed for moderate pain.     Dispense:  20 tablet    Refill:  0  . levothyroxine (SYNTHROID) 100 MCG tablet    Sig: TAKE 1 TABLET BY MOUTH DAILY BEFORE BREAKFAST    Dispense:  90 tablet    Refill:  1  . atorvastatin (LIPITOR) 40 MG tablet    Sig: TAKE 1 TABLET BY MOUTH EVERY DAY    Dispense:  90 tablet    Refill:  1   Patient Instructions  They did not see pneumonia or rib fracture on x-ray today. I suspect some your cough may be due to a viral illness, or flare of COPD. Based on the amount of albuterol use Saturday, and persistent cough, can start prednisone 2 pills per day for 5 days. Continue your albuterol up to every 4-6 hours as needed, but if you have to use that medicine more than 3 times per day in the next 2-3 days, return here or the emergency room. Hydrocodone can help with the chest wall pain as well as sometimes suppress the cough.  Schedule CT scan this week if possible.  If chest wall pain is not improving with prednisone, and no source found on your CAT scan, would recommend you follow-up with Dr. Allyson Sabal. I did  refer you back to pulmonary to look into other causes. Do not take ibuprofen or Aleve for now as will be taking prednisone, but once you're off of the prednisone, can take either ibuproen or Aleve over-the-counter for chest wall pain.  Return to the clinic or go to the nearest emergency room if any of your symptoms worsen or new symptoms occur.  Cough, Adult Coughing is a reflex that clears your throat and your airways. Coughing helps to heal and protect your lungs. It is normal to cough occasionally, but a cough that happens with other symptoms or lasts a long time may be a sign of a condition that needs treatment. A cough may last only 2-3 weeks (acute), or it may last longer than 8 weeks (chronic). CAUSES Coughing is commonly caused by:  Breathing in substances that irritate your lungs.  A viral or bacterial respiratory infection.  Allergies.  Asthma.  Postnasal  drip.  Smoking.  Acid backing up from the stomach into the  esophagus (gastroesophageal reflux).  Certain medicines.  Chronic lung problems, including COPD (or rarely, lung cancer).  Other medical conditions such as heart failure. HOME CARE INSTRUCTIONS  Pay attention to any changes in your symptoms. Take these actions to help with your discomfort:  Take medicines only as told by your health care provider.  If you were prescribed an antibiotic medicine, take it as told by your health care provider. Do not stop taking the antibiotic even if you start to feel better.  Talk with your health care provider before you take a cough suppressant medicine.  Drink enough fluid to keep your urine clear or pale yellow.  If the air is dry, use a cold steam vaporizer or humidifier in your bedroom or your home to help loosen secretions.  Avoid anything that causes you to cough at work or at home.  If your cough is worse at night, try sleeping in a semi-upright position.  Avoid cigarette smoke. If you smoke, quit smoking. If you need help quitting, ask your health care provider.  Avoid caffeine.  Avoid alcohol.  Rest as needed. SEEK MEDICAL CARE IF:   You have new symptoms.  You cough up pus.  Your cough does not get better after 2-3 weeks, or your cough gets worse.  You cannot control your cough with suppressant medicines and you are losing sleep.  You develop pain that is getting worse or pain that is not controlled with pain medicines.  You have a fever.  You have unexplained weight loss.  You have night sweats. SEEK IMMEDIATE MEDICAL CARE IF:  You cough up blood.  You have difficulty breathing.  Your heartbeat is very fast.   This information is not intended to replace advice given to you by your health care provider. Make sure you discuss any questions you have with your health care provider.   Document Released: 03/31/2011 Document Revised: 06/23/2015 Document  Reviewed: 12/09/2014 Elsevier Interactive Patient Education 2016 Elsevier Inc[employer]    Chronic Obstructive Pulmonary Disease Chronic obstructive pulmonary disease (COPD) is a common lung condition in which airflow from the lungs is limited. COPD is a general term that can be used to describe many different lung problems that limit airflow, including both chronic bronchitis and emphysema. If you have COPD, your lung function will probably never return to normal, but there are measures you can take to improve lung function and make yourself feel better. CAUSES   Smoking (common).  Exposure to secondhand smoke.  Genetic problems.  Chronic inflammatory lung diseases or recurrent infections. SYMPTOMS  Shortness of breath, especially with physical activity.  Deep, persistent (chronic) cough with a large amount of thick mucus.  Wheezing.  Rapid breaths (tachypnea).  Gray or bluish discoloration (cyanosis) of the skin, especially in your fingers, toes, or lips.  Fatigue.  Weight loss.  Frequent infections or episodes when breathing symptoms become much worse (exacerbations).  Chest tightness. DIAGNOSIS Your health care provider will take a medical history and perform a physical examination to diagnose COPD. Additional tests for COPD may include:  Lung (pulmonary) function tests.  Chest X-ray.  CT scan.  Blood tests. TREATMENT  Treatment for COPD may include:  Inhaler and nebulizer medicines. These help manage the symptoms of COPD and make your breathing more comfortable.  Supplemental oxygen. Supplemental oxygen is only helpful if you have a low oxygen level in your blood.  Exercise and physical activity. These are beneficial for nearly all people with COPD.  Lung surgery or transplant.  Nutrition therapy to gain weight, if you are underweight.  Pulmonary rehabilitation. This may involve working with a team of health care providers and specialists, such as respiratory,  occupational, and physical therapists. HOME CARE INSTRUCTIONS  Take all medicines (inhaled or pills) as directed by your health care provider.  Avoid over-the-counter medicines or cough syrups that dry up your airway (such as antihistamines) and slow down the elimination of secretions unless instructed otherwise by your health care provider.  If you are a smoker, the most important thing that you can do is stop smoking. Continuing to smoke will cause further lung damage and breathing trouble. Ask your health care provider for help with quitting smoking. He or she can direct you to community resources or hospitals that provide support.  Avoid exposure to irritants such as smoke, chemicals, and fumes that aggravate your breathing.  Use oxygen therapy and pulmonary rehabilitation if directed by your health care provider. If you require home oxygen therapy, ask your health care provider whether you should purchase a pulse oximeter to measure your oxygen level at home.  Avoid contact with individuals who have a contagious illness.  Avoid extreme temperature and humidity changes.  Eat healthy foods. Eating smaller, more frequent meals and resting before meals may help you maintain your strength.  Stay active, but balance activity with periods of rest. Exercise and physical activity will help you maintain your ability to do things you want to do.  Preventing infection and hospitalization is very important when you have COPD. Make sure to receive all the vaccines your health care provider recommends, especially the pneumococcal and influenza vaccines. Ask your health care provider whether you need a pneumonia vaccine.  Learn and use relaxation techniques to manage stress.  Learn and use controlled breathing techniques as directed by your health care provider. Controlled breathing techniques include:  Pursed lip breathing. Start by breathing in (inhaling) through your nose for 1 second. Then, purse  your lips as if you were going to whistle and breathe out (exhale) through the pursed lips for 2 seconds.  Diaphragmatic breathing. Start by putting one hand on your abdomen just above your waist. Inhale slowly through your nose. The hand on your abdomen should move out. Then purse your lips and exhale slowly. You should be able to feel the hand on your abdomen moving in as you exhale.  Learn and use controlled coughing to clear mucus from your lungs. Controlled coughing is a series of short, progressive coughs. The steps of controlled coughing are: 1. Lean your head slightly forward. 2. Breathe in deeply using diaphragmatic breathing. 3. Try to hold your breath for 3 seconds. 4. Keep your mouth slightly open while coughing twice. 5. Spit any mucus out into a tissue. 6. Rest and repeat the steps once or twice as needed. SEEK MEDICAL CARE IF:  You are coughing up more mucus than usual.  There is a change in the color or thickness of your mucus.  Your breathing is more labored than usual.  Your breathing is faster than usual. SEEK IMMEDIATE MEDICAL CARE IF:  You have shortness of breath while you are resting.  You have shortness of breath that prevents you from:  Being able to talk.  Performing your usual physical activities.  You have chest pain lasting longer than 5 minutes.  Your skin color is more cyanotic than usual.  You measure low oxygen saturations for longer than 5 minutes with a pulse oximeter. MAKE SURE YOU:  Understand these  instructions.  Will watch your condition.  Will get help right away if you are not doing well or get worse.   This information is not intended to replace advice given to you by your health care provider. Make sure you discuss any questions you have with your health care provider.   Document Released: 07/12/2005 Document Revised: 10/23/2014 Document Reviewed: 05/29/2013 Elsevier Interactive Patient Education 2016 Elsevier Inc.   Chest  Wall Pain Chest wall pain is pain in or around the bones and muscles of your chest. Sometimes, an injury causes this pain. Sometimes, the cause may not be known. This pain may take several weeks or longer to get better. HOME CARE INSTRUCTIONS  Pay attention to any changes in your symptoms. Take these actions to help with your pain:   Rest as told by your health care provider.   Avoid activities that cause pain. These include any activities that use your chest muscles or your abdominal and side muscles to lift heavy items.   If directed, apply ice to the painful area:  Put ice in a plastic bag.  Place a towel between your skin and the bag.  Leave the ice on for 20 minutes, 2-3 times per day.  Take over-the-counter and prescription medicines only as told by your health care provider.  Do not use tobacco products, including cigarettes, chewing tobacco, and e-cigarettes. If you need help quitting, ask your health care provider.  Keep all follow-up visits as told by your health care provider. This is important. SEEK MEDICAL CARE IF:  You have a fever.  Your chest pain becomes worse.  You have new symptoms. SEEK IMMEDIATE MEDICAL CARE IF:  You have nausea or vomiting.  You feel sweaty or light-headed.  You have a cough with phlegm (sputum) or you cough up blood.  You develop shortness of breath.   This information is not intended to replace advice given to you by your health care provider. Make sure you discuss any questions you have with your health care provider.   Document Released: 10/02/2005 Document Revised: 06/23/2015 Document Reviewed: 12/28/2014 Elsevier Interactive Patient Education [employer]     I personally performed the services described in this documentation, which was scribed in my presence. The recorded information has been reviewed and considered, and addended by me as needed.

## 2015-12-13 NOTE — Patient Instructions (Addendum)
They did not see pneumonia or rib fracture on x-ray today. I suspect some your cough may be due to a viral illness, or flare of COPD. Based on the amount of albuterol use Saturday, and persistent cough, can start prednisone 2 pills per day for 5 days. Continue your albuterol up to every 4-6 hours as needed, but if you have to use that medicine more than 3 times per day in the next 2-3 days, return here or the emergency room. Hydrocodone can help with the chest wall pain as well as sometimes suppress the cough.  Schedule CT scan this week if possible.  If chest wall pain is not improving with prednisone, and no source found on your CAT scan, would recommend you follow-up with Dr. Allyson Sabal. I did  refer you back to pulmonary to look into other causes. Do not take ibuprofen or Aleve for now as will be taking prednisone, but once you're off of the prednisone, can take either ibuproen or Aleve over-the-counter for chest wall pain.  Return to the clinic or go to the nearest emergency room if any of your symptoms worsen or new symptoms occur.  Cough, Adult Coughing is a reflex that clears your throat and your airways. Coughing helps to heal and protect your lungs. It is normal to cough occasionally, but a cough that happens with other symptoms or lasts a long time may be a sign of a condition that needs treatment. A cough may last only 2-3 weeks (acute), or it may last longer than 8 weeks (chronic). CAUSES Coughing is commonly caused by:  Breathing in substances that irritate your lungs.  A viral or bacterial respiratory infection.  Allergies.  Asthma.  Postnasal drip.  Smoking.  Acid backing up from the stomach into the esophagus (gastroesophageal reflux).  Certain medicines.  Chronic lung problems, including COPD (or rarely, lung cancer).  Other medical conditions such as heart failure. HOME CARE INSTRUCTIONS  Pay attention to any changes in your symptoms. Take these actions to help with  your discomfort:  Take medicines only as told by your health care provider.  If you were prescribed an antibiotic medicine, take it as told by your health care provider. Do not stop taking the antibiotic even if you start to feel better.  Talk with your health care provider before you take a cough suppressant medicine.  Drink enough fluid to keep your urine clear or pale yellow.  If the air is dry, use a cold steam vaporizer or humidifier in your bedroom or your home to help loosen secretions.  Avoid anything that causes you to cough at work or at home.  If your cough is worse at night, try sleeping in a semi-upright position.  Avoid cigarette smoke. If you smoke, quit smoking. If you need help quitting, ask your health care provider.  Avoid caffeine.  Avoid alcohol.  Rest as needed. SEEK MEDICAL CARE IF:   You have new symptoms.  You cough up pus.  Your cough does not get better after 2-3 weeks, or your cough gets worse.  You cannot control your cough with suppressant medicines and you are losing sleep.  You develop pain that is getting worse or pain that is not controlled with pain medicines.  You have a fever.  You have unexplained weight loss.  You have night sweats. SEEK IMMEDIATE MEDICAL CARE IF:  You cough up blood.  You have difficulty breathing.  Your heartbeat is very fast.   This information is not intended  to replace advice given to you by your health care provider. Make sure you discuss any questions you have with your health care provider.   Document Released: 03/31/2011 Document Revised: 06/23/2015 Document Reviewed: 12/09/2014 Elsevier Interactive Patient Education 2016 Else[employer] Inc[employer]    Chronic Obstructive Pulmonary Disease Chronic obstructive pulmonary disease (COPD) is a common lung condition in which airflow from the lungs is limited. COPD is a general term that can be used to describe many different lung problems that limit airflow,  including both chronic bronchitis and emphysema. If you have COPD, your lung function will probably never return to normal, but there are measures you can take to improve lung function and make yourself feel better. CAUSES   Smoking (common).  Exposure to secondhand smoke.  Genetic problems.  Chronic inflammatory lung diseases or recurrent infections. SYMPTOMS  Shortness of breath, especially with physical activity.  Deep, persistent (chronic) cough with a large amount of thick mucus.  Wheezing.  Rapid breaths (tachypnea).  Gray or bluish discoloration (cyanosis) of the skin, especially in your fingers, toes, or lips.  Fatigue.  Weight loss.  Frequent infections or episodes when breathing symptoms become much worse (exacerbations).  Chest tightness. DIAGNOSIS Your health care provider will take a medical history and perform a physical examination to diagnose COPD. Additional tests for COPD may include:  Lung (pulmonary) function tests.  Chest X-ray.  CT scan.  Blood tests. TREATMENT  Treatment for COPD may include:  Inhaler and nebulizer medicines. These help manage the symptoms of COPD and make your breathing more comfortable.  Supplemental oxygen. Supplemental oxygen is only helpful if you have a low oxygen level in your blood.  Exercise and physical activity. These are beneficial for nearly all people with COPD.  Lung surgery or transplant.  Nutrition therapy to gain weight, if you are underweight.  Pulmonary rehabilitation. This may involve working with a team of health care providers and specialists, such as respiratory, occupational, and physical therapists. HOME CARE INSTRUCTIONS  Take all medicines (inhaled or pills) as directed by your health care provider.  Avoid over-the-counter medicines or cough syrups that dry up your airway (such as antihistamines) and slow down the elimination of secretions unless instructed otherwise by your health care  provider.  If you are a smoker, the most important thing that you can do is stop smoking. Continuing to smoke will cause further lung damage and breathing trouble. Ask your health care provider for help with quitting smoking. He or she can direct you to community resources or hospitals that provide support.  Avoid exposure to irritants such as smoke, chemicals, and fumes that aggravate your breathing.  Use oxygen therapy and pulmonary rehabilitation if directed by your health care provider. If you require home oxygen therapy, ask your health care provider whether you should purchase a pulse oximeter to measure your oxygen level at home.  Avoid contact with individuals who have a contagious illness.  Avoid extreme temperature and humidity changes.  Eat healthy foods. Eating smaller, more frequent meals and resting before meals may help you maintain your strength.  Stay active, but balance activity with periods of rest. Exercise and physical activity will help you maintain your ability to do things you want to do.  Preventing infection and hospitalization is very important when you have COPD. Make sure to receive all the vaccines your health care provider recommends, especially the pneumococcal and influenza vaccines. Ask your health care provider whether you need a pneumonia vaccine.  Learn and use  relaxation techniques to manage stress.  Learn and use controlled breathing techniques as directed by your health care provider. Controlled breathing techniques include:  Pursed lip breathing. Start by breathing in (inhaling) through your nose for 1 second. Then, purse your lips as if you were going to whistle and breathe out (exhale) through the pursed lips for 2 seconds.  Diaphragmatic breathing. Start by putting one hand on your abdomen just above your waist. Inhale slowly through your nose. The hand on your abdomen should move out. Then purse your lips and exhale slowly. You should be able to  feel the hand on your abdomen moving in as you exhale.  Learn and use controlled coughing to clear mucus from your lungs. Controlled coughing is a series of short, progressive coughs. The steps of controlled coughing are: 1. Lean your head slightly forward. 2. Breathe in deeply using diaphragmatic breathing. 3. Try to hold your breath for 3 seconds. 4. Keep your mouth slightly open while coughing twice. 5. Spit any mucus out into a tissue. 6. Rest and repeat the steps once or twice as needed. SEEK MEDICAL CARE IF:  You are coughing up more mucus than usual.  There is a change in the color or thickness of your mucus.  Your breathing is more labored than usual.  Your breathing is faster than usual. SEEK IMMEDIATE MEDICAL CARE IF:  You have shortness of breath while you are resting.  You have shortness of breath that prevents you from:  Being able to talk.  Performing your usual physical activities.  You have chest pain lasting longer than 5 minutes.  Your skin color is more cyanotic than usual.  You measure low oxygen saturations for longer than 5 minutes with a pulse oximeter. MAKE SURE YOU:  Understand these instructions.  Will watch your condition.  Will get help right away if you are not doing well or get worse.   This information is not intended to replace advice given to you by your health care provider. Make sure you discuss any questions you have with your health care provider.   Document Released: 07/12/2005 Document Revised: 10/23/2014 Document Reviewed: 05/29/2013 Elsevier Interactive Patient Education 2016 Elsevier Inc.   Chest Wall Pain Chest wall pain is pain in or around the bones and muscles of your chest. Sometimes, an injury causes this pain. Sometimes, the cause may not be known. This pain may take several weeks or longer to get better. HOME CARE INSTRUCTIONS  Pay attention to any changes in your symptoms. Take these actions to help with your pain:    Rest as told by your health care provider.   Avoid activities that cause pain. These include any activities that use your chest muscles or your abdominal and side muscles to lift heavy items.   If directed, apply ice to the painful area:  Put ice in a plastic bag.  Place a towel between your skin and the bag.  Leave the ice on for 20 minutes, 2-3 times per day.  Take over-the-counter and prescription medicines only as told by your health care provider.  Do not use tobacco products, including cigarettes, chewing tobacco, and e-cigarettes. If you need help quitting, ask your health care provider.  Keep all follow-up visits as told by your health care provider. This is important. SEEK MEDICAL CARE IF:  You have a fever.  Your chest pain becomes worse.  You have new symptoms. SEEK IMMEDIATE MEDICAL CARE IF:  You have nausea or vomiting.  You  feel sweaty or light-headed.  You have a cough with phlegm (sputum) or you cough up blood.  You develop shortness of breath.   This information is not intended to replace advice given to you by your health care provider. Make sure you discuss any questions you have with your health care provider.   Document Released: 10/02/2005 Document Revised: 06/23/2015 Document Reviewed: 12/28/2014 Elsevier Interactive Patient Education [employer]

## 2015-12-14 ENCOUNTER — Other Ambulatory Visit: Payer: BLUE CROSS/BLUE SHIELD

## 2015-12-17 ENCOUNTER — Telehealth: Payer: Self-pay

## 2015-12-17 NOTE — Telephone Encounter (Signed)
Pt called in and is upset about her CT being cancelled. I spoke to referrals her insurance denied it and a message has ben sent to Dr Neva SeatGreene. She can be reached [phone] home or [phone] cell. Thank you

## 2015-12-17 NOTE — Telephone Encounter (Signed)
Dr. Neva SeatGreene to review

## 2015-12-19 NOTE — Telephone Encounter (Signed)
Bethany Clarke or Bethany Clarke can you please check into this?  Please explain to patient I'm not sure of the reason it was denied at this time, but I can refer her to pulmonary now if she would like, and they can possibly help set up a CT.  Let me know what she would like to do.

## 2015-12-20 ENCOUNTER — Other Ambulatory Visit: Payer: BLUE CROSS/BLUE SHIELD

## 2015-12-20 NOTE — Telephone Encounter (Signed)
The order request was denied due to the following: "Advanced imaging is not generally appropriate for this indication."  The indication being the diagnosis given for the order, as well as any documentation listed in the OV notes.  I called [facility] and they directed me to a voicemail to a nurse that performs provider courtesy reviews.  She will likely call me back today or tomorrow.  We will find out if we are able to appeal the adverse determination via additional information or peer to peer review.

## 2015-12-20 NOTE — Telephone Encounter (Signed)
Called patients mobile, message left. I see where we are in the process of trying to get this CT pushed through. I let her know that I'm happy to refer her to pulmonary sooner if she would like, and could possibly have her see them this week. She's to let me know what she would like to do.

## 2015-12-20 NOTE — Telephone Encounter (Signed)
Referral, do we know why this was cancelled or denied?

## 2015-12-21 ENCOUNTER — Ambulatory Visit
Admission: RE | Admit: 2015-12-21 | Discharge: 2015-12-21 | Disposition: A | Discharge status: Home / Self Care | Payer: BLUE CROSS/BLUE SHIELD | Source: Ambulatory Visit | Attending: Family Medicine | Admitting: Family Medicine

## 2015-12-21 DIAGNOSIS — R0789 Other chest pain: Secondary | ICD-10-CM

## 2015-12-21 DIAGNOSIS — N644 Mastodynia: Secondary | ICD-10-CM

## 2015-12-23 ENCOUNTER — Ambulatory Visit (INDEPENDENT_AMBULATORY_CARE_PROVIDER_SITE_OTHER): Payer: BLUE CROSS/BLUE SHIELD | Admitting: Internal Medicine

## 2015-12-23 ENCOUNTER — Encounter: Payer: Self-pay | Admitting: Internal Medicine

## 2015-12-23 VITALS — BP 124/78 | HR 66 | Ht 64.5 in | Wt 188.4 lb

## 2015-12-23 DIAGNOSIS — J449 Chronic obstructive pulmonary disease, unspecified: Secondary | ICD-10-CM

## 2015-12-23 MED ORDER — BUDESONIDE-FORMOTEROL FUMARATE 80-4.5 MCG/ACT IN AERO: INHALATION_SPRAY | RESPIRATORY_TRACT | Status: DC

## 2015-12-23 MED ORDER — PANTOPRAZOLE SODIUM 40 MG PO TBEC: 40.0000 mg | DELAYED_RELEASE_TABLET | Freq: Every day | ORAL | Status: DC

## 2015-12-23 MED ORDER — TRAMADOL HCL 50 MG PO TABS: ORAL_TABLET | ORAL | Status: DC

## 2015-12-23 MED ORDER — FAMOTIDINE 20 MG PO TABS: ORAL_TABLET | ORAL | Status: DC

## 2015-12-23 MED ORDER — FLUTTER DEVI
Status: DC
Start: 2015-12-23 — End: 2017-04-06

## 2015-12-23 NOTE — Patient Instructions (Addendum)
The key to effective treatment for your cough is eliminating the non-stop cycle of cough you're stuck in long enough to let your airway heal completely and then see if there is anything still making you cough once you stop the cough suppression, but this should take no more than 5 days to figure out  First try mucinex dm  1200 mg  12 hours with the flutter and supplement if needed with  tramadol 50 mg up to 1-2 every 4 hours to suppress the urge to cough at all or even clear your throat. Swallowing water or using ice chips/non mint and menthol containing candies (such as lifesavers or sugarless jolly ranchers) are also effective.  You should rest your voice and avoid activities that you know make you cough.  Once you have eliminated the cough for 3 straight days try reducing the tramadol first,  then the mucinex dm as tolerated.    Protonix (pantoprazole) Take 30-60 min before first meal of the day and Pepcid 20 mg one bedtime until return   GERD (REFLUX)  is an extremely common cause of respiratory symptoms, many times with no significant heartburn at all.    It can be treated with medication, but also with lifestyle changes including avoidance of late meals, excessive alcohol, smoking cessation, and avoid fatty foods, chocolate, peppermint, colas, red wine, and acidic juices such as orange juice.  NO MINT OR MENTHOL PRODUCTS SO NO COUGH DROPS  USE HARD CANDY INSTEAD (jolley ranchers or Stover's or Lifesavers (all available in sugarless versions) NO OIL BASED VITAMINS - use powdered substitutes.  Symbicort 80 Take 2 puffs first thing in am and then another 2 puffs about 12 hours later.    Please schedule a follow up office visit in 6 weeks, call sooner if needed with pfts

## 2015-12-23 NOTE — Assessment & Plan Note (Addendum)
11/2013 Spirometry > ratio 62%, FEV1 1.48 L (57% pred) but f/v no physiologic in the effort dep portion  11/2013 AAT level normal, no mutation - 12/23/2015  extensive coaching HFA effectiveness =   90% > try symbicort 80 2bid   Symptoms of sob/cough / cp  are markedly disproportionate to objective findings and not clear this is all a lung problem but pt does appear to have difficult airway management issues. DDX of  difficult airways management almost all start with A and  include Adherence, Ace Inhibitors, Acid Reflux, Active Sinus Disease, Alpha 1 Antitripsin deficiency, Anxiety masquerading as Airways dz,  ABPA,  Allergy(esp in young), Aspiration (esp in elderly), Adverse effects of meds,  Active smokers, A bunch of PE's (a small clot burden can't cause this syndrome unless there is already severe underlying pulm or vascular dz with poor reserve) plus two Bs  = Bronchiectasis and Beta blocker use..and one C= CHF  Adherence is always the initial "prime suspect" and is a multilayered concern that requires a "trust but verify" approach in every patient - starting with knowing how to use medications, especially inhalers, correctly, keeping up with refills and understanding the fundamental difference between maintenance and prns vs those medications only taken for a very short course and then stopped and not refilled.  - The proper method of use, as well as anticipated side effects, of a metered-dose inhaler are discussed and demonstrated to the patient. Improved effectiveness after extensive coaching during this visit to a level of approximately 90 % from a baseline of 75 %   ? Acid (or non-acid) GERD > always difficult to exclude as up to 75% of pts in some series report no assoc GI/ Heartburn symptoms> rec max (24h)  acid suppression and diet restrictions/ reviewed and instructions given in writing.   ? Allergy/asthma > try just low dose ics for now as higher doses risk making the cough worse  ? Active  smoking > denies.  Note when respiratory symptoms begin or become refractory well after a patient reports complete smoking cessation,  Especially when this wasn't the case while they were smoking, a red flag is raised based on the work of Dr Primitivo GauzeFletcher which states:  if you quit smoking when your best day FEV1 is still well preserved it is highly unlikely you will progress to severe disease.  That is to say, once the smoking stops,  the symptoms should not suddenly erupt or markedly worsen.  If so, the differential diagnosis should include  obesity/deconditioning,  LPR/Reflux/Aspiration syndromes,  occult CHF, or  especially side effect of medications commonly used in this population which I could not identify in her case  Next step is CT chest which she will have report sent to me and return here in 6 weeks for pfts   I had an extended discussion with the patient reviewing all relevant studies completed to date and  lasting 35 minutes of a 60 minute visit    Each maintenance medication was reviewed in detail including most importantly the difference between maintenance and prns and under what circumstances the prns are to be triggered using an action plan format that is not reflected in the computer generated alphabetically organized AVS.    Please see instructions for details which were reviewed in writing and the patient given a copy highlighting the part that I personally wrote and discussed at today's ov.

## 2015-12-23 NOTE — Telephone Encounter (Signed)
The CT scan has been approved by the insurance through provider appeal.  The [facility] and [facility] have been notified of this determination, so that she can be rescheduled.

## 2015-12-23 NOTE — Progress Notes (Signed)
Subjective:     Patient ID: Bethany Clarke, female   DOB: 02/19/1956,    MRN: [id number]  HPI  [id number] yowf quit smoking in 2014 with onset of episodic cough/sob x age 60 more a chronic issue age 60 and really stopped smoking due to stent at wt around 160 with progressive doe since quit and intermittent severe cough and unexplained R ant cp/ L post cp x since spring of 2016 so scheduled for CT and referred by Dr Silas SacramentoJeff Greene to pulmonary clinic 12/23/2015   12/23/2015 1st [facility] office visit/ Cornelious Diven   Chief Complaint  Patient presents with  . Pulmonary Consult    Pt seen here last by Dr Kendrick FriesMcQuaid in 2015. She was referred back to see us by Dr Neva SeatGreene. She is c/o increased cough x 6 months. She c/o pain in her back and also under her right breast- started 6 months ago, but worse for the past 2 months. She also c/o SOB when she gets in a hurry and when walking up stairs.   MMRC2 = can't walk a nl pace on a flat grade s sob x 100 yards and freq has to stop at top / some better with saba  Worse daily cough x6 m p lunch especially, some at bedtime but no flares in am's Cp started p bad cough R below breast and L post below scapula "like a numb sensation" with no h/o shingles she's aware of.  No obvious day to day or daytime variability or assoc excess/ purulent sputum or mucus plugs or hemoptysis or chest tightness, subjective wheeze or overt sinus or hb symptoms. No unusual exp hx or h/o childhood pna/ asthma or knowledge of premature birth.  Sleeping ok without nocturnal  or early am exacerbation  of respiratory  c/o's or need for noct saba. Also denies any obvious fluctuation of symptoms with weather or environmental changes or other aggravating or alleviating factors except as outlined above   Current Medications, Allergies, Complete Past Medical History, Past Surgical History, Family History, and Social History were reviewed in [facility] electronic medical record.  ROS  The following are  not active complaints unless bolded sore throat, dysphagia, dental problems, itching, sneezing,  nasal congestion or excess/ purulent secretions, ear ache,   fever, chills, sweats, unintended wt loss, classically pleuritic or exertional cp,  orthopnea pnd or leg swelling, presyncope, palpitations, abdominal pain, anorexia, nausea, vomiting, diarrhea  or change in bowel or bladder habits, change in stools or urine, dysuria,hematuria,  rash, arthralgias, visual complaints, headache, numbness, weakness or ataxia or problems with walking or coordination,  change in mood/affect or memory.         Review of Systems     Objective:   Physical Exam    amb wf nad rattling congested cough   Wt Readings from Last 3 Encounters:  12/23/15 188 lb 6.4 oz (85.458 kg)  12/01/15 189 lb 3.2 oz (85.821 kg)  11/16/15 190 lb 12.8 oz (86.546 kg)    Vital signs reviewed   HEENT: upper and lower dentures , turbinates, and oropharynx. Nl external ear canals without cough reflex   NECK :  without JVD/Nodes/TM/ nl carotid upstrokes bilaterally   LUNGS: no acc muscle use,  Nl contour chest which is clear to A and P bilaterally without cough on insp or exp maneuvers   CV:  RRR  no s3 or murmur or increase in P2, no edema   ABD:  soft and nontender with nl inspiratory  excursion in the supine position. No bruits or organomegaly, bowel sounds nl  MS:  Nl gait/ ext warm without deformities, calf tenderness, cyanosis or clubbing No obvious joint restrictions   SKIN: warm and dry without lesions    NEURO:  alert, approp, nl sensorium with  no motor deficits      I personally reviewed images and agree with radiology impression as follows:  CXR:  12/13/15 1. No acute displaced right-sided rib fractures. 2. No radiographic evidence of acute cardiopulmonary disease. 3. Atherosclerosis.     Assessment:

## 2015-12-24 ENCOUNTER — Ambulatory Visit
Admission: RE | Admit: 2015-12-24 | Discharge: 2015-12-24 | Disposition: A | Discharge status: Home / Self Care | Payer: BLUE CROSS/BLUE SHIELD | Source: Ambulatory Visit | Attending: Family Medicine | Admitting: Family Medicine

## 2015-12-24 DIAGNOSIS — R079 Chest pain, unspecified: Secondary | ICD-10-CM

## 2015-12-24 MED ORDER — IOPAMIDOL (ISOVUE-300) INJECTION 61%
75.0000 mL | Freq: Once | INTRAVENOUS | Status: AC | PRN
  Administered 2015-12-24: 75 mL via INTRAVENOUS

## 2016-01-13 ENCOUNTER — Other Ambulatory Visit: Payer: Self-pay | Admitting: Family Medicine

## 2016-01-27 ENCOUNTER — Other Ambulatory Visit: Payer: Self-pay | Admitting: Internal Medicine

## 2016-01-27 DIAGNOSIS — J449 Chronic obstructive pulmonary disease, unspecified: Secondary | ICD-10-CM

## 2016-01-31 ENCOUNTER — Ambulatory Visit (INDEPENDENT_AMBULATORY_CARE_PROVIDER_SITE_OTHER): Payer: BLUE CROSS/BLUE SHIELD | Admitting: Internal Medicine

## 2016-01-31 DIAGNOSIS — J449 Chronic obstructive pulmonary disease, unspecified: Secondary | ICD-10-CM | POA: Diagnosis not present

## 2016-01-31 LAB — PULMONARY FUNCTION TEST
DL/VA % pred: 61 %
DL/VA: 3 ml/min/mmHg/L
DLCO COR % PRED: 53 %
DLCO COR: 13.61 ml/min/mmHg
DLCO unc % pred: 53 %
DLCO unc: 13.48 ml/min/mmHg
FEF 25-75 POST: 1.32 L/s
FEF 25-75 Pre: 0.68 L/sec
FEF2575-%Change-Post: 93 %
FEF2575-%PRED-POST: 54 %
FEF2575-%PRED-PRE: 28 %
FEV1-%CHANGE-POST: 21 %
FEV1-%PRED-POST: 64 %
FEV1-%Pred-Pre: 53 %
FEV1-POST: 1.71 L
FEV1-Pre: 1.41 L
FEV1FVC-%CHANGE-POST: 7 %
FEV1FVC-%PRED-PRE: 78 %
FEV6-%Change-Post: 13 %
FEV6-%Pred-Post: 78 %
FEV6-%Pred-Pre: 68 %
FEV6-POST: 2.57 L
FEV6-Pre: 2.26 L
FEV6FVC-%Change-Post: 0 %
FEV6FVC-%PRED-POST: 102 %
FEV6FVC-%Pred-Pre: 102 %
FVC-%Change-Post: 13 %
FVC-%PRED-POST: 76 %
FVC-%PRED-PRE: 67 %
FVC-POST: 2.6 L
FVC-PRE: 2.3 L
POST FEV1/FVC RATIO: 66 %
PRE FEV1/FVC RATIO: 61 %
PRE FEV6/FVC RATIO: 98 %
Post FEV6/FVC ratio: 99 %
RV % pred: 165 %
RV: 3.33 L
TLC % PRED: 110 %
TLC: 5.73 L

## 2016-01-31 NOTE — Progress Notes (Signed)
PFT done today. 

## 2016-02-02 NOTE — Progress Notes (Signed)
Quick Note:  lmtcb ______ 

## 2016-02-03 ENCOUNTER — Ambulatory Visit (INDEPENDENT_AMBULATORY_CARE_PROVIDER_SITE_OTHER): Payer: BLUE CROSS/BLUE SHIELD | Admitting: Internal Medicine

## 2016-02-03 ENCOUNTER — Encounter: Payer: Self-pay | Admitting: Internal Medicine

## 2016-02-03 VITALS — BP 134/82 | HR 67 | Ht 64.75 in | Wt 187.0 lb

## 2016-02-03 DIAGNOSIS — J449 Chronic obstructive pulmonary disease, unspecified: Secondary | ICD-10-CM

## 2016-02-03 MED ORDER — BUDESONIDE-FORMOTEROL FUMARATE 160-4.5 MCG/ACT IN AERO: INHALATION_SPRAY | RESPIRATORY_TRACT | Status: DC

## 2016-02-03 NOTE — Patient Instructions (Signed)
Plan A = Automatic = symbicort 160 Take 2 puffs first thing in am and then another 2 puffs about 12 hours later.    Work on inhaler technique:  relax and gently blow all the way out then take a nice smooth deep breath back in, triggering the inhaler at same time you start breathing in.  Hold for up to 5 seconds if you can. Blow out thru nose. Rinse and gargle with water when done     Plan B = Backup Only use your albuterol as a rescue medication to be used if you can't catch your breath by resting or doing a relaxed purse lip breathing pattern.  - The less you use it, the better it will work when you need it. - Ok to use the inhaler up to 2 puffs  every 4 hours if you must but call for appointment if use goes up over your usual need - Don't leave home without it !!  (think of it like the spare tire for your car)   For cough > mucinex dm (or mucinex) up to 1200 mg every 12 hours and use the flutter valve as much as possible   Please schedule a follow up office visit in 6 weeks, call sooner if needed

## 2016-02-03 NOTE — Progress Notes (Signed)
Subjective:     Patient ID: Bethany Clarke, female   DOB: 11/03/1955,    MRN: [id number]    Brief patient profile:  [id number] quit smoking in 2014 with onset of episodic cough/sob x age 60 more a chronic issue age 60 and really stopped smoking due to stent at wt around 160 with progressive doe since quit and intermittent severe cough and unexplained R ant cp/ L post cp x since spring of 2016 so scheduled for CT and referred by Dr Silas SacramentoJeff Greene to pulmonary clinic 12/23/2015 with documented GOLD II copd 01/31/16    History of Present Illness  12/23/2015 1st  Pulmonary office visit/ Mikael Debell   Chief Complaint  Patient presents with  . Pulmonary Consult    Pt seen here last by Dr Kendrick FriesMcQuaid in 2015. She was referred back to see us by Dr Neva SeatGreene. She is c/o increased cough x 6 months. She c/o pain in her back and also under her right breast- started 6 months ago, but worse for the past 2 months. She also c/o SOB when she gets in a hurry and when walking up stairs.   MMRC2 = can't walk a nl pace on a flat grade s sob x 100 yards and freq has to stop at top / some better with saba  Worse daily cough x 6 m p lunch especially, some at bedtime but no flares in am's Cp started p bad cough R below breast and L post below scapula "like a numb sensation" with no h/o shingles she's aware of First try mucinex dm  1200 mg  12 hours with the flutter and supplement if needed with  tramadol 50 mg up to 1-2 every 4 hours to suppress the urge to cough at all or even clear your throat. Swallowing water or using ice chips/non mint and menthol containing candies (such as lifesavers or sugarless jolly ranchers) are also effective.  You should rest your voice and avoid activities that you know make you cough. Once you have eliminated the cough for 3 straight days try reducing the tramadol first,  then the mucinex dm as tolerated.   Protonix (pantoprazole) Take 30-60 min before first meal of the day and Pepcid 20 mg one bedtime  until return  GERD  Diet  Symbicort 80 Take 2 puffs first thing in am and then another 2 puffs about 12 hours later.    02/03/2016  f/u ov/[facility] re: GOLD II copd/ not using much tramadol/ still on symb 80/gerd rx/ maybe albuterol once per day  Chief Complaint  Patient presents with  . Follow-up    Review PFT's today. Pt states her breathing is at baseline. Her cough has improved some. The pain under right breast and her back are unchanged. She is using albuterol inhaler 1 x daily on average.   doe improved MMRC1 = can walk nl pace, flat grade, can't hurry or go uphills or steps s sob   Cp better, cough also better    No obvious day to day or daytime variability or assoc excess/ purulent sputum or mucus plugs or hemoptysis or chest tightness, subjective wheeze or overt sinus or hb symptoms. No unusual exp hx or h/o childhood pna/ asthma or knowledge of premature birth.  Sleeping ok without nocturnal  or early am exacerbation  of respiratory  c/o's or need for noct saba. Also denies any obvious fluctuation of symptoms with weather or environmental changes or other aggravating or alleviating factors except as outlined above  Current Medications, Allergies, Complete Past Medical History, Past Surgical History, Family History, and Social History were reviewed in [employer] record.  ROS  The following are not active complaints unless bolded sore throat, dysphagia, dental problems, itching, sneezing,  nasal congestion or excess/ purulent secretions, ear ache,   fever, chills, sweats, unintended wt loss, classically pleuritic or exertional cp,  orthopnea pnd or leg swelling, presyncope, palpitations, abdominal pain, anorexia, nausea, vomiting, diarrhea  or change in bowel or bladder habits, change in stools or urine, dysuria,hematuria,  rash, arthralgias, visual complaints, headache, numbness, weakness or ataxia or problems with walking or coordination,  change in mood/affect or  memory.               Objective:   Physical Exam    amb wf nad still  congested cough    02/03/2016        187   12/23/15 188 lb 6.4 oz (85.458 kg)  12/01/15 189 lb 3.2 oz (85.821 kg)  11/16/15 190 lb 12.8 oz (86.546 kg)    Vital signs reviewed   HEENT: upper and lower dentures , turbinates, and oropharynx. Nl external ear canals without cough reflex   NECK :  without JVD/Nodes/TM/ nl carotid upstrokes bilaterally   LUNGS: no acc muscle use,  Nl contour chest with mild bilateral  Mid/late exp rhonchi/exp cough    CV:  RRR  no s3 or murmur or increase in P2, no edema   ABD:  soft and nontender with nl inspiratory excursion in the supine position. No bruits or organomegaly, bowel sounds nl  MS:  Nl gait/ ext warm without deformities, calf tenderness, cyanosis or clubbing No obvious joint restrictions   SKIN: warm and dry without lesions    NEURO:  alert, approp, nl sensorium with  no motor deficits      I personally reviewed images and agree with radiology impression as follows:  CXR:  12/13/15 1. No acute displaced right-sided rib fractures. 2. No radiographic evidence of acute cardiopulmonary disease. 3. Atherosclerosis.     Assessment:

## 2016-02-03 NOTE — Assessment & Plan Note (Addendum)
11/2013 Spirometry > ratio 62%, FEV1 1.48 L (57% pred) but f/v no physiologic in the effort dep portion  11/2013 AAT level normal, no mutation - 12/23/2015    try symbicort 80 2bid  - CT 12/24/15 > Minimal atelectasis or scarring seen in lingular segment of left upper lobe. Mild emphysematous disease is noted in the upper lobes bilaterally. No other abnormality seen in the chest. - PFT's   01/31/16  FEV1 1.71 (64 % ) ratio 66  p 21 % improvement from saba p am dose of symb  with DLCO  53/53c % corrects to 61 % for alv volume and ERV 14%   - 02/03/2016  After extensive coaching HFA effectiveness =  75%   > try symbicort 160 2bid   She clearly has undertreated AB based on p saba improvement p symb 80 x 2 in am    rec max rx for airways inflammation > high dose symbicort if tolerates/ flutter for cough   I had an extended discussion with the patient reviewing all relevant studies completed to date and  lasting 15 to 20 minutes of a 25 minute visit    Each maintenance medication was reviewed in detail including most importantly the difference between maintenance and prns and under what circumstances the prns are to be triggered using an action plan format that is not reflected in the computer generated alphabetically organized AVS.    Please see instructions for details which were reviewed in writing and the patient given a copy highlighting the part that I personally wrote and discussed at today's ov.

## 2016-02-03 NOTE — Progress Notes (Signed)
Quick Note:  D/w pt at ov today ______ 

## 2016-02-07 ENCOUNTER — Other Ambulatory Visit: Payer: Self-pay | Admitting: Family Medicine

## 2016-02-25 ENCOUNTER — Ambulatory Visit: Payer: BLUE CROSS/BLUE SHIELD | Admitting: Cardiovascular Disease

## 2016-03-15 ENCOUNTER — Encounter: Payer: Self-pay | Admitting: Cardiovascular Disease

## 2016-03-15 ENCOUNTER — Ambulatory Visit (INDEPENDENT_AMBULATORY_CARE_PROVIDER_SITE_OTHER): Payer: BLUE CROSS/BLUE SHIELD | Admitting: Cardiovascular Disease

## 2016-03-15 VITALS — BP 150/89 | HR 74 | Ht 64.5 in | Wt 186.4 lb

## 2016-03-15 DIAGNOSIS — R42 Dizziness and giddiness: Secondary | ICD-10-CM | POA: Diagnosis not present

## 2016-03-15 DIAGNOSIS — I771 Stricture of artery: Secondary | ICD-10-CM

## 2016-03-15 DIAGNOSIS — R55 Syncope and collapse: Secondary | ICD-10-CM | POA: Diagnosis not present

## 2016-03-15 NOTE — Assessment & Plan Note (Signed)
History of left subclavian artery stenosis with subclavian steal status post left subclavian artery PTA and stenting by myself for dysuria 99% proximal stenosis to 0% residual in 2014. Follow-up Dopplers performed 10/14/15 revealed this to be widely patent.

## 2016-03-15 NOTE — Assessment & Plan Note (Signed)
History of hyperlipidemia on statin therapy with recent lipid profile performed 12/01/15 revealed a total cholesterol 148, LDL 62 and HDL of 59.

## 2016-03-15 NOTE — Patient Instructions (Addendum)
Medication Instructions:  Your physician recommends that you continue on your current medications as directed. Please refer to the Current Medication list given to you today.   Labwork: NONE  Testing/Procedures: Your physician has requested that you have a carotid duplex. This test is an ultrasound of the carotid arteries in your neck. It looks at blood flow through these arteries that supply the brain with blood. Allow one hour for this exam. There are no restrictions or special instructions.  IN December 2017.   Follow-Up: Your physician wants you to follow-up in: 12 MONTHS WITH DR Allyson SabalBERRY. You will receive a reminder letter in the mail two months in advance. If you don't receive a letter, please call our office to schedule the follow-up appointment.   Any Other Special Instructions Will Be Listed Below (If Applicable).     If you need a refill on your cardiac medications before your next appointment, please call your pharmacy.

## 2016-03-15 NOTE — Progress Notes (Signed)
03/15/2016 Bethany Clarke   1956-08-03  [id number]  Primary Physician Bethany Agreste, MD Primary Cardiologist: Bethany Harp MD Bethany Clarke   HPI:  he patient is a 60 year old female, who has a history of hypothyroidism and hyperlipidemia since 1999. I last saw her in the office 10/28/13.In September 2013, she apparently had an endoscopy for epigastric discomfort and was treated with Nexium. She also was told to have sludge in her gallbladder and some inflammation of her bile duct, for which she has seen Dr. Henrene Clarke. The patient has a very strong family history for coronary artery disease with a sister, who underwent CABG surgery at age 10, as well as a father, who died at a young age with cancer but also had heart disease, and also many family members with stroke and heart disease. The patient admits to a 44 year history of tobacco use, having started at age 22. She has noticed recurrent episodes of epigastric and somewhat right-sided chest discomfort. She also has noticed a progressive decline in activity with development of exertional shortness of breath. Approximately three weeks ago, she was awakened from sleep with a lower sternal epigastric discomfort, which radiated to her back and was associated with jaw discomfort. She was evaluated at urgent care. In light of her strong family history and progressive symptomatology after my initial evaluation on 02/19/2013, and a cardiac catheterization was performed on Mar 03, 2013. His reveal normal LV function without definite obstructive disease. Her left artery system was entirely normal. She did have mild ostial calcification of her right coronary artery without stenosis.  [name] admits to arm fatigue and weakness. When I saw her initially on may 7,2014 she did have a 30 mm blood pressure differential between her right and left arm. Subsequent carotid duplex imaging suggested proximal left subclavian occlusion. She did have 0-49%  stenosis in the right internal carotid artery and had progressive stenosis in her left mid internal carotid now showing a 50-69% diameter reduction. She had retrograde flow in her left vertebral artery suggestive of subclavian steal. In retrospect, she does admit to episodes of dizziness. Upper extremity duplex imaging revealed a 38 mm reduction in left brachial artery pressure.  A 2-D echo Doppler study showed ejection fraction of 55-60% with normal diastolic parameters and mildly thickened mitral valve leaflets appear today to further discuss potential peripheral vascular catheterization and potential for intervention in the left subclavian artery.  She underwent aortic arch angiography and bilateral subclavian angiography revealing a 99% proximal left subclavian artery stenosis with retrograde left vertebral filling. I started her proximal/ostial left subclavian artery with excellent angiographic results. She was discharged on the following day. Her followup Dopplers were normal. Since discharge she no longer has MA claudication. Her dizziness has resolved as well.  She presents today for to evaluate bilateral foot pain that is new over the last 2 weeks. The pain is in the anterior portion of her foot records for seconds at a time without relationship to activity. There is a neuropathic component. Her exam is completely normal and she has 2+ pedal pulses.  I performed a left subclavian artery angiography, PTCA and stenting in May of last year with marked improvement in her subclavian steal symptoms and her left upper; the patient. Assessment Doppler is normalized. She did stop smoking at that time. She's gained 20 pounds and complains of shortness of breath. She had angiographic documented normal coronary arteries by cath performed Dr. Claiborne Clarke. ince I saw HER-2 years  ago she's remained quite nicely stable. Her left upper extremity is without symptoms and her carotid Doppler showed this to be widely  patent.   Current Outpatient Prescriptions  Medication Sig Dispense Refill  . albuterol (PROVENTIL HFA;VENTOLIN HFA) 108 (90 Base) MCG/ACT inhaler Inhale 1-2 puffs into the lungs every 4 (four) hours as needed for wheezing or shortness of breath. 1 Inhaler 0  . aspirin 81 MG tablet Take 81 mg by mouth daily.    Marland Kitchen atorvastatin (LIPITOR) 40 MG tablet TAKE 1 TABLET BY MOUTH EVERY DAY 90 tablet 1  . budesonide-formoterol (SYMBICORT) 160-4.5 MCG/ACT inhaler Take 2 puffs first thing in am and then another 2 puffs about 12 hours later. 1 Inhaler 11  . clopidogrel (PLAVIX) 75 MG tablet Take 1 tablet (75 mg total) by mouth daily. 90 tablet 3  . famotidine (PEPCID) 20 MG tablet One at bedtime 30 tablet 2  . levothyroxine (SYNTHROID) 100 MCG tablet TAKE 1 TABLET BY MOUTH DAILY BEFORE BREAKFAST 90 tablet 1  . pantoprazole (PROTONIX) 40 MG tablet Take 1 tablet (40 mg total) by mouth daily. Take 30-60 min before first meal of the day 30 tablet 2  . Respiratory Therapy Supplies (FLUTTER) DEVI Use as directed 1 each 0   No current facility-administered medications for this visit.    No Known Allergies  Social History   Social History  . Marital Status: Married    Spouse Name: N/A  . Number of Children: 3  . Years of Education: N/A   Occupational History  . 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 Smoking status: Former Smoker -- 0.50 packs/day for 40 years    Types: Cigarettes    Quit date: 02/11/2013  . Smokeless tobacco: Never Used  . Alcohol Use: No  . Drug Use: No  . Sexual Activity: Yes     Comment: number of sex partners in the last 12 months  1   Other Topics Concern  . Not on file   Social History Narrative   Daily caffeine. Married. Education: [employer]. Exercise: No.     Review of Systems: General: negative for chills, fever, night sweats or weight changes.  Cardiovascular: negative for chest pain, dyspnea on exertion, edema, orthopnea, palpitations, paroxysmal  nocturnal dyspnea or shortness of breath Dermatological: negative for rash Respiratory: negative for cough or wheezing Urologic: negative for hematuria Abdominal: negative for nausea, vomiting, diarrhea, bright red blood per rectum, melena, or hematemesis Neurologic: negative for visual changes, syncope, or dizziness All other systems reviewed and are otherwise negative except as noted above.    Blood pressure 150/89, pulse 74, height 5' 4.5" (1.638 m), weight 186 lb 6.4 oz (84.55 kg).  General appearance: alert and no distress Neck: no adenopathy, no carotid bruit, no JVD, supple, symmetrical, trachea midline and thyroid not enlarged, symmetric, no tenderness/mass/nodules Lungs: clear to auscultation bilaterally Heart: regular rate and rhythm, S1, S2 normal, no murmur, click, rub or gallop Extremities: extremities normal, atraumatic, no cyanosis or edema  EKG normal sinus rhythm at 74 with nonspecific ST and T-wave changes. I personally reviewed this EKG  ASSESSMENT AND PLAN:   Subclavian arterial stenosis (HCC) History of left subclavian artery stenosis with subclavian steal status post left subclavian artery PTA and stenting by myself for dysuria 99% proximal stenosis to 0% residual in 2014. Follow-up Dopplers performed 10/14/15 revealed this to be widely patent.  Hyperlipidemia LDL goal < 70 History of hyperlipidemia on statin therapy with recent lipid profile  performed 12/01/15 revealed a total cholesterol 148, LDL 62 and HDL of 59.      Bethany Harp MD FACP,FACC,FAHA, [facility] 03/15/2016 4:26 PM

## 2016-03-17 ENCOUNTER — Encounter: Payer: Self-pay | Admitting: Internal Medicine

## 2016-03-17 ENCOUNTER — Ambulatory Visit (INDEPENDENT_AMBULATORY_CARE_PROVIDER_SITE_OTHER): Payer: BLUE CROSS/BLUE SHIELD | Admitting: Internal Medicine

## 2016-03-17 VITALS — BP 130/82 | HR 60 | Ht 64.5 in | Wt 185.6 lb

## 2016-03-17 DIAGNOSIS — R0789 Other chest pain: Secondary | ICD-10-CM | POA: Diagnosis not present

## 2016-03-17 DIAGNOSIS — J449 Chronic obstructive pulmonary disease, unspecified: Secondary | ICD-10-CM | POA: Diagnosis not present

## 2016-03-17 MED ORDER — ALBUTEROL SULFATE HFA 108 (90 BASE) MCG/ACT IN AERS: 1.0000 | INHALATION_SPRAY | RESPIRATORY_TRACT | Status: DC | PRN

## 2016-03-17 NOTE — Patient Instructions (Signed)
Classic subdiaphragmatic pain pattern suggests ibs:  Stereotypical,   very limited distribution of pain locations, daytime, not exacerbated by ex or coughing, worse in sitting position,  not as likely  supine due to the dome effect of the diaphragm is  canceled in that position. Frequently these patients have had multiple negative GI workups and CT scans.  Treatment consists of avoiding foods that cause gas (especially [place] food, boiled eggs, beans and raw vegetables like spinach and salads)  and citrucel 1 heaping tsp twice daily with a large glass of water.  Pain should improve w/in 2 weeks     If you are satisfied with your treatment plan,  let your doctor know and he/she can either refill your medications or you can return here when your prescription runs out.     If in any way you are not 100% satisfied,  please tell us.  If 100% better, tell your friends!  Pulmonary follow up is as needed

## 2016-03-17 NOTE — Assessment & Plan Note (Signed)
11/2013 Spirometry > ratio 62%, FEV1 1.48 L (57% pred) but f/v no physiologic in the effort dep portion  11/2013 AAT level normal, no mutation - 12/23/2015    try symbicort 80 2bid  - CT 12/24/15 > Minimal atelectasis or scarring seen in lingular segment of left upper lobe. Mild emphysematous disease is noted in the upper lobes bilaterally. No other abnormality seen in the chest. - PFT's   01/31/16  FEV1 1.71 (64 % ) ratio 66  p 21 % improvement from saba p am dose of symb  with DLCO  53/53c % corrects to 61 % for alv volume and ERV 14%  - 02/03/2016    try symbicort 160 2bid  - 03/17/2016  After extensive coaching HFA effectiveness =    90%   I had an extended final summary discussion with the patient reviewing all relevant studies completed to date and  lasting 15 to 20 minutes of a 25 minute visit on the following issues:    Only GOLD II and doing much better overall with excellent ex tol   I reviewed the Fletcher curve with the patient that basically indicates  if you quit smoking when your best day FEV1 is still well preserved (as is relatively  the case here)  it is highly unlikely you will progress to severe disease and informed the patient there was no medication on the market that has proven to alter the curve/ its downward trajectory  or the likelihood of progression of their disease.  Therefore stopping smoking and maintaining abstinence is the most important aspect of care, not choice of inhalers or for that matter, doctors.    Pulmonary f/u is therefor prn  Each maintenance medication was reviewed in detail including most importantly the difference between maintenance and as needed and under what circumstances the prns are to be used.  Please see instructions for details which were reviewed in writing and the patient given a copy.

## 2016-03-17 NOTE — Progress Notes (Signed)
Subjective:     Patient ID: Bethany Clarke, female   DOB: 1956/06/16,    MRN: [id number]    Brief patient profile:  60 yowf quit smoking in 2014 with onset of episodic cough/sob x age 60 more a chronic issue age 78 and really stopped smoking due to stent at wt around 160 with progressive doe since quit and intermittent severe cough and unexplained R ant cp/ L post cp x since spring of 2016 so scheduled for CT and referred by Bethany Clarke to pulmonary clinic 12/23/2015 with documented GOLD II copd 01/31/16    History of Present Illness  12/23/2015 1st [facility] office visit/ Bethany Clarke   Chief Complaint  Patient presents with  . Pulmonary Consult    Pt seen here last by Bethany Clarke in 2015. She was referred back to see [place] by Bethany Clarke. She is c/o increased cough x 6 months. She c/o pain in her back and also under her right breast- started 6 months ago, but worse for the past 2 months. She also c/o SOB when she gets in a hurry and when walking up stairs.   MMRC2 = can't walk a nl pace on a flat grade s sob x 100 yards and freq has to stop at top / some better with saba  Worse daily cough x 6 m p lunch especially, some at bedtime but no flares in am's Cp started p bad cough R below breast and L post below scapula "like a numb sensation" with no h/o shingles she's aware of First try mucinex dm  1200 mg  12 hours with the flutter and supplement if needed with  tramadol 50 mg up to 1-2 every 4 hours to suppress the urge to cough at all or even clear your throat. Swallowing water or using ice chips/non mint and menthol containing candies (such as lifesavers or sugarless jolly ranchers) are also effective.  You should rest your voice and avoid activities that you know make you cough. Once you have eliminated the cough for 3 straight days try reducing the tramadol first,  then the mucinex dm as tolerated.   Protonix (pantoprazole) Take 30-60 min before first meal of the day and Pepcid 20 mg one bedtime  until return  GERD  Diet  Symbicort 80 Take 2 puffs first thing in am and then another 2 puffs about 12 hours later.    02/03/2016  f/u ov/Bethany Clarke re: GOLD II copd/ not using much tramadol/ still on symb 80/gerd rx/ maybe albuterol once per day  Chief Complaint  Patient presents with  . Follow-up    Review PFT's today. Pt states her breathing is at baseline. Her cough has improved some. The pain under right breast and her back are unchanged. She is using albuterol inhaler 1 x daily on average.   doe improved MMRC1 = can walk nl pace, flat grade, can't hurry or go uphills or steps s sob   Cp better, cough also better  rec Plan A = Automatic = symbicort 160 Take 2 puffs first thing in am and then another 2 puffs about 12 hours later.  Work on inhaler technique:  Plan B = Backup Only use your albuterol as a rescue medication For cough > mucinex dm (or mucinex) up to 1200 mg every 12 hours and use the flutter valve as much as possible     03/17/2016  f/u ov/Bethany Clarke re: symbicort 160 2 bid/ prn saba  Chief Complaint  Patient presents with  .  Follow-up    Breathing is unchanged. She is using her albuterol 3 x per wk on average. Her right side pain is not as bad as it has been but still present.   R CP  X 6 m happens sev times a week/ more likely sitting/bending over, occ hs/ not with cough  Last 5 secs to 1 hour  Treadmill up to 3.5 mph x 2 miles s stopping flat grade     No obvious day to day or daytime variability or assoc excess/ purulent sputum or mucus plugs or hemoptysis or chest tightness, subjective wheeze or overt sinus or hb symptoms. No unusual exp hx or h/o childhood pna/ asthma or knowledge of premature birth.  Sleeping ok without nocturnal  or early am exacerbation  of respiratory  c/o's or need for noct saba. Also denies any obvious fluctuation of symptoms with weather or environmental changes or other aggravating or alleviating factors except as outlined above   Current  Medications, Allergies, Complete Past Medical History, Past Surgical History, Family History, and Social History were reviewed in [facility] electronic medical record.  ROS  The following are not active complaints unless bolded sore throat, dysphagia, dental problems, itching, sneezing,  nasal congestion or excess/ purulent secretions, ear ache,   fever, chills, sweats, unintended wt loss, classically pleuritic or exertional cp,  orthopnea pnd or leg swelling, presyncope, palpitations, abdominal pain, anorexia, nausea, vomiting, diarrhea  or change in bowel or bladder habits, change in stools or urine, dysuria,hematuria,  rash, arthralgias, visual complaints, headache, numbness, weakness or ataxia or problems with walking or coordination,  change in mood/affect or memory.               Objective:   Physical Exam    amb wf nad / minimal rattling cough on fvc   03/17/2016          186   02/03/2016        187   12/23/15 188 lb 6.4 oz (85.458 kg)  12/01/15 189 lb 3.2 oz (85.821 kg)  11/16/15 190 lb 12.8 oz (86.546 kg)    Vital signs reviewed   HEENT: upper and lower dentures , turbinates, and oropharynx. Nl external ear canals without cough reflex   NECK :  without JVD/Nodes/TM/ nl carotid upstrokes bilaterally   LUNGS: no acc muscle use,  Nl contour chest with clear bilaterally   CV:  RRR  no s3 or murmur or increase in P2, no edema   ABD:  soft and nontender with nl inspiratory excursion in the supine position. No bruits or organomegaly, bowel sounds nl  MS:  Nl gait/ ext warm without deformities, calf tenderness, cyanosis or clubbing No obvious joint restrictions   SKIN: warm and dry without lesions    NEURO:  alert, approp, nl sensorium with  no motor deficits      I personally reviewed images and agree with radiology impression as follows:  CXR:  12/13/15 1. No acute displaced right-sided rib fractures. 2. No radiographic evidence of acute cardiopulmonary  disease. 3. Atherosclerosis.     Assessment:

## 2016-03-17 NOTE — Assessment & Plan Note (Signed)
Very convincing for hep flexure syndrome > needs trial for IBS rx

## 2016-04-10 ENCOUNTER — Other Ambulatory Visit: Payer: Self-pay | Admitting: Internal Medicine

## 2016-05-30 ENCOUNTER — Other Ambulatory Visit: Payer: Self-pay | Admitting: Internal Medicine

## 2016-06-09 ENCOUNTER — Other Ambulatory Visit: Payer: Self-pay | Admitting: Family Medicine

## 2016-06-09 DIAGNOSIS — E785 Hyperlipidemia, unspecified: Secondary | ICD-10-CM

## 2016-06-30 IMAGING — CR DG RIBS W/ CHEST 3+V*R*
3 series · 3 of 3 positions shown · non-contrast
Comparison: Chest x-ray 10/16/2015.

CLINICAL DATA: 59-year-old female with right-sided chest wall pain.

EXAM:
RIGHT RIBS AND CHEST - 3+ VIEW; CHEST  1 VIEW

[PA]
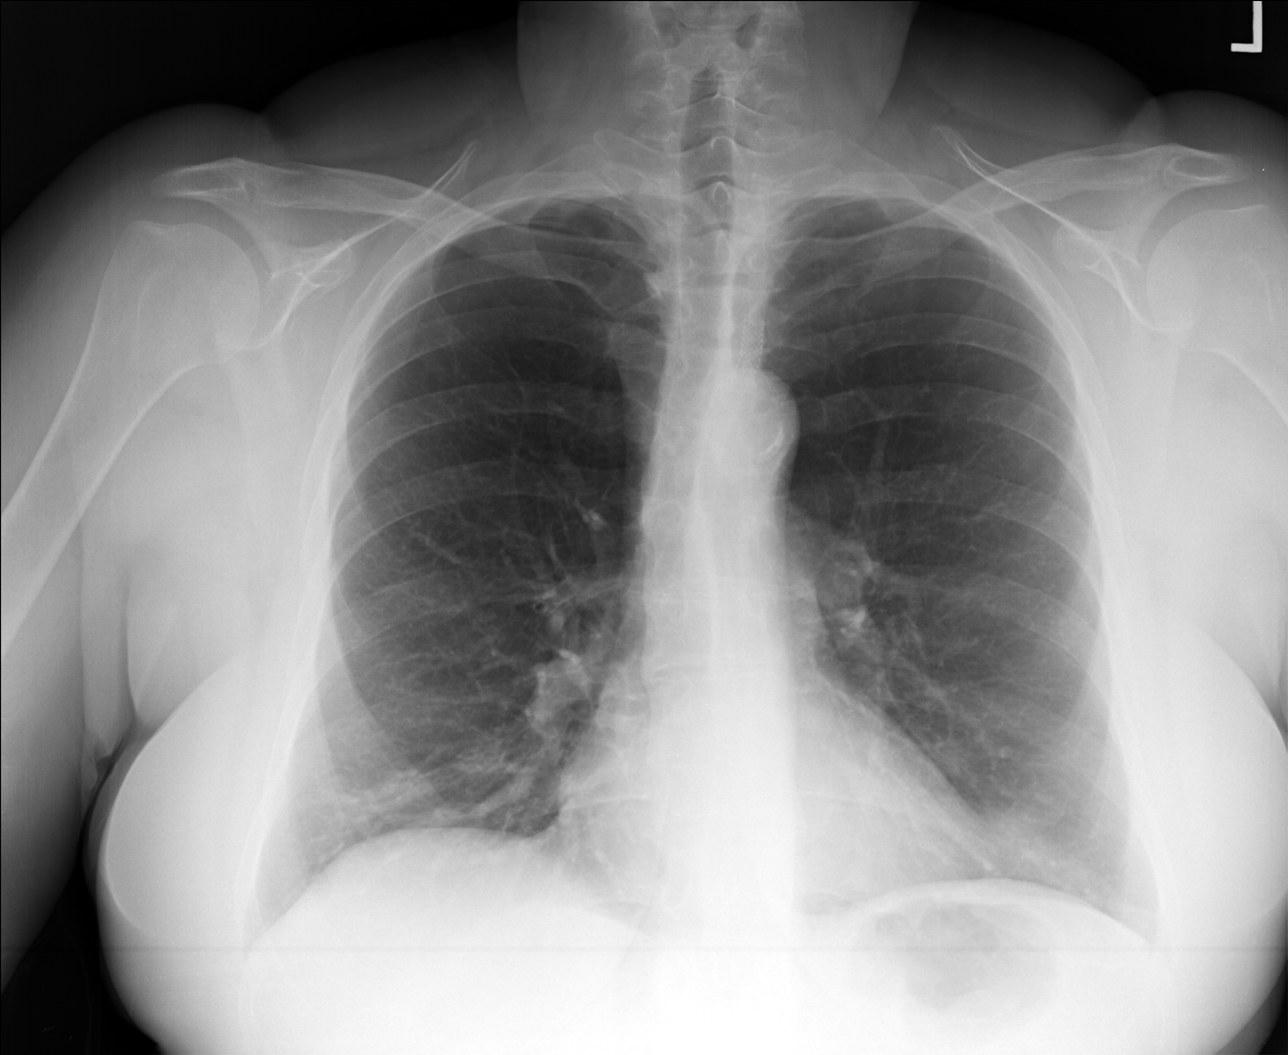

[rpo (1 of 2)]
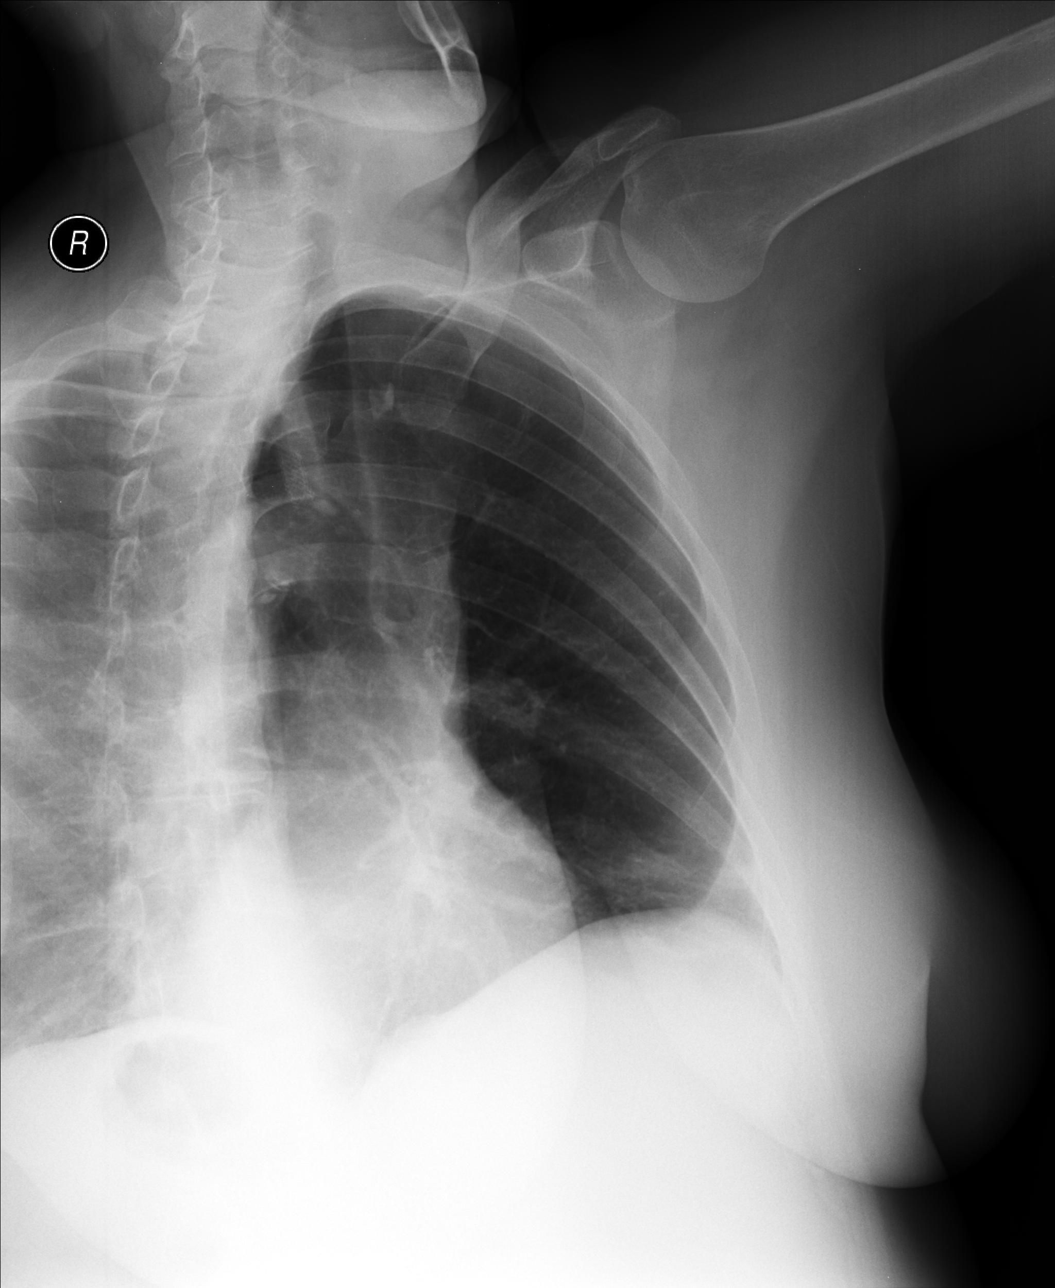

[rpo (2 of 2)]
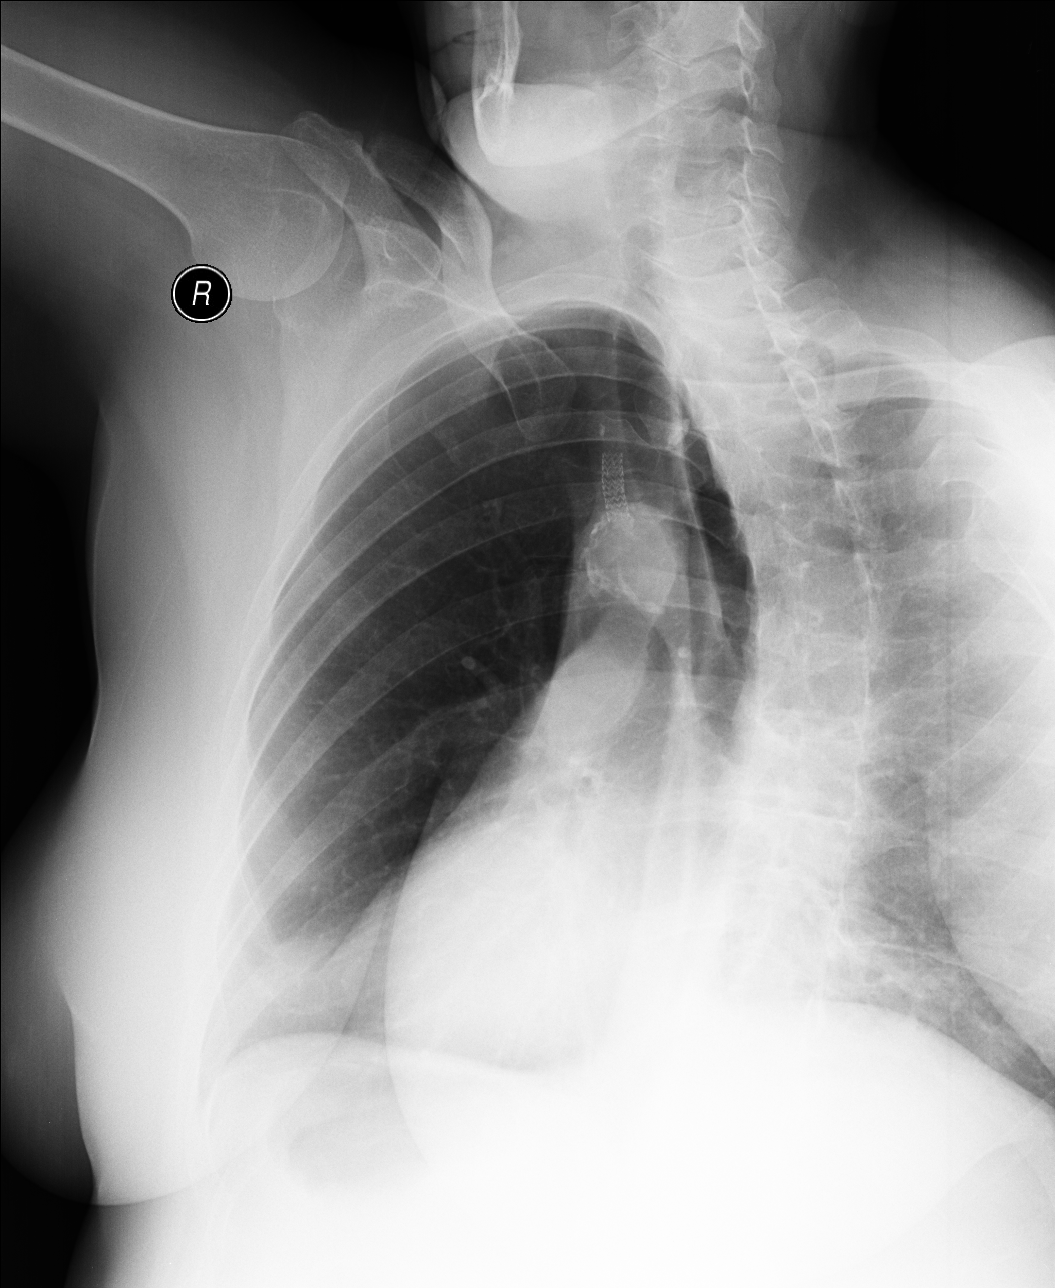

[3 of 3 positions shown; findings below may reference images not displayed]

FINDINGS: Lung volumes are normal. No consolidative airspace disease. No
pleural effusions. No pneumothorax. No pulmonary nodule or mass
noted. Pulmonary vasculature and the cardiomediastinal silhouette
are within normal limits. Atherosclerosis in the thoracic aorta.
Stent noted, possibly in the left proximal common carotid artery or
left subclavian artery.

Dedicated views of the right ribs demonstrate no acute displaced
right-sided rib fractures.
IMPRESSION: 1. No acute displaced right-sided rib fractures.
2. No radiographic evidence of acute cardiopulmonary disease.
3. Atherosclerosis.

## 2016-06-30 IMAGING — CR DG CHEST 1V
1 series · 1 of 1 positions shown · non-contrast
Comparison: Chest x-ray 10/16/2015.

CLINICAL DATA: 59-year-old female with right-sided chest wall pain.

EXAM:
RIGHT RIBS AND CHEST - 3+ VIEW; CHEST  1 VIEW

[PA]
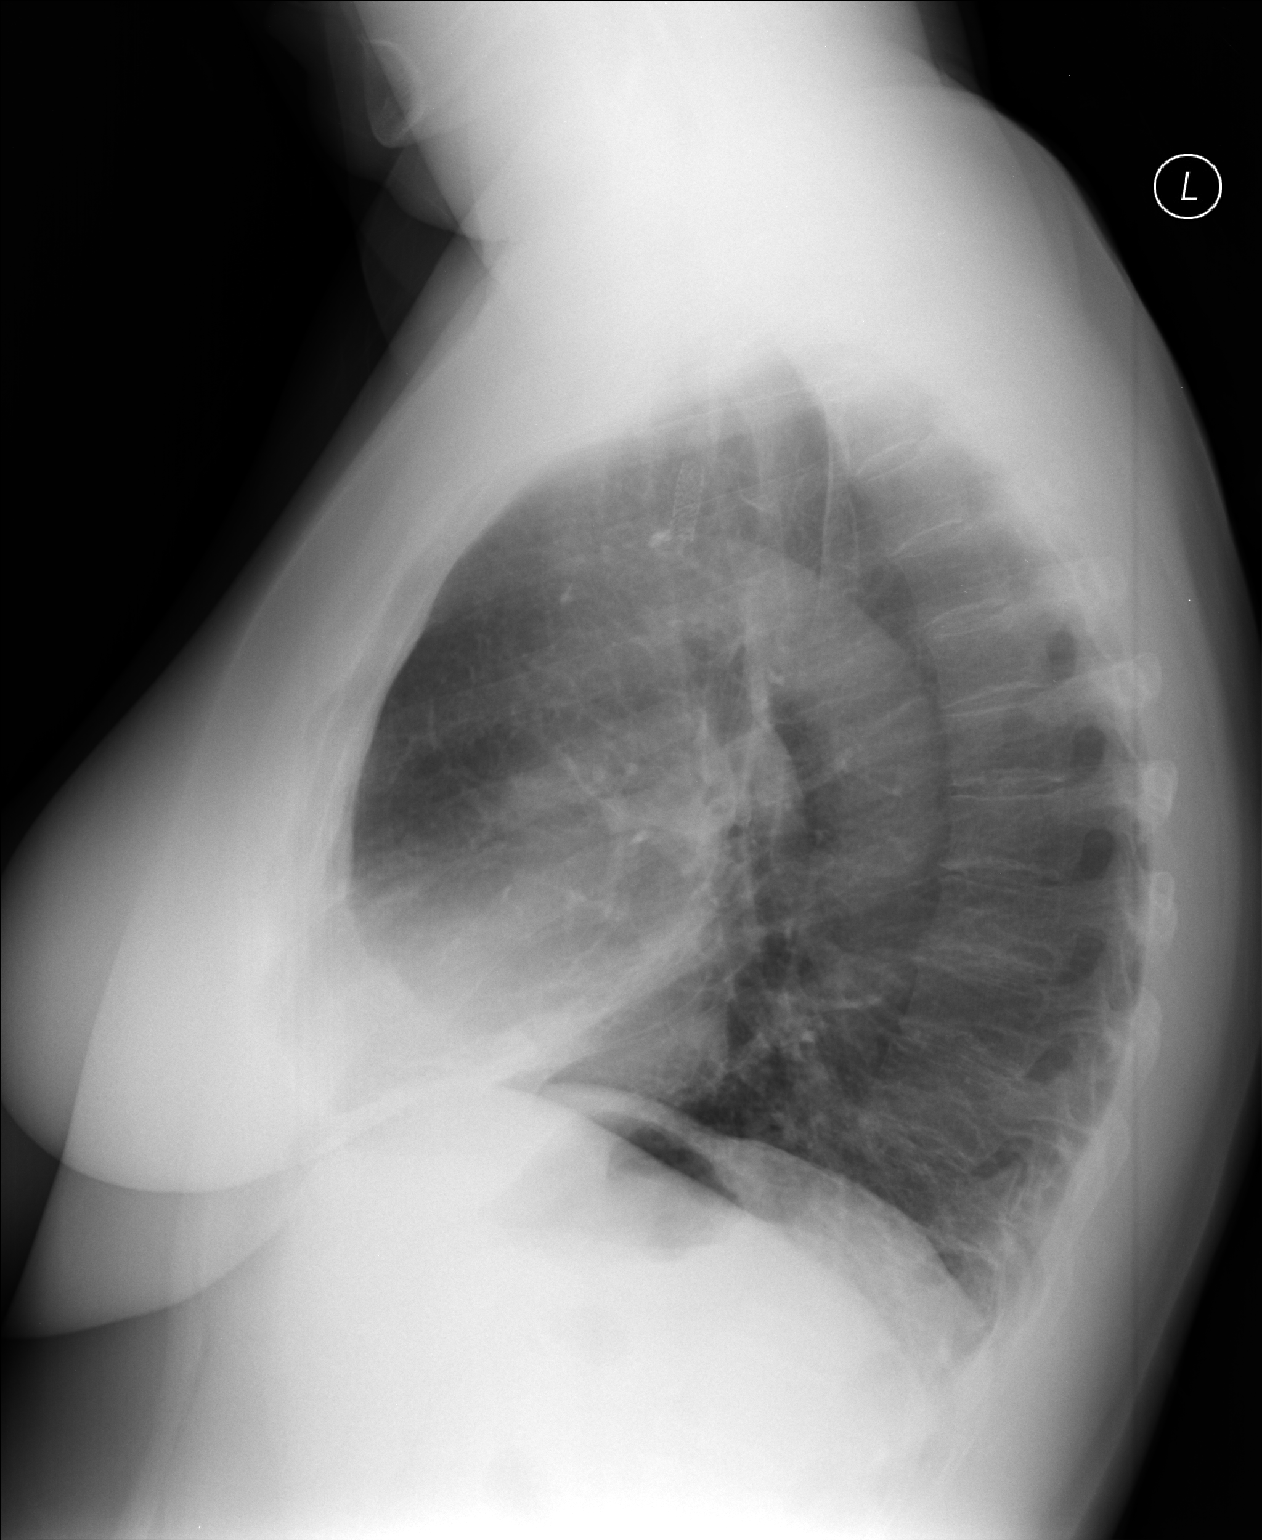

[1 of 1 positions shown; findings below may reference images not displayed]

FINDINGS: Lung volumes are normal. No consolidative airspace disease. No
pleural effusions. No pneumothorax. No pulmonary nodule or mass
noted. Pulmonary vasculature and the cardiomediastinal silhouette
are within normal limits. Atherosclerosis in the thoracic aorta.
Stent noted, possibly in the left proximal common carotid artery or
left subclavian artery.

Dedicated views of the right ribs demonstrate no acute displaced
right-sided rib fractures.
IMPRESSION: 1. No acute displaced right-sided rib fractures.
2. No radiographic evidence of acute cardiopulmonary disease.
3. Atherosclerosis.

## 2016-07-02 ENCOUNTER — Other Ambulatory Visit: Payer: Self-pay | Admitting: Family Medicine

## 2016-07-02 DIAGNOSIS — E038 Other specified hypothyroidism: Secondary | ICD-10-CM

## 2016-08-07 ENCOUNTER — Other Ambulatory Visit: Payer: Self-pay | Admitting: Cardiovascular Disease

## 2016-10-13 ENCOUNTER — Other Ambulatory Visit: Payer: Self-pay | Admitting: Family Medicine

## 2016-10-13 DIAGNOSIS — E038 Other specified hypothyroidism: Secondary | ICD-10-CM

## 2016-10-31 ENCOUNTER — Ambulatory Visit (HOSPITAL_COMMUNITY)
Admission: RE | Admit: 2016-10-31 | Discharge: 2016-10-31 | Disposition: A | Discharge status: Home / Self Care | Payer: BLUE CROSS/BLUE SHIELD | Source: Ambulatory Visit | Attending: Cardiology | Admitting: Cardiology

## 2016-10-31 DIAGNOSIS — E785 Hyperlipidemia, unspecified: Secondary | ICD-10-CM | POA: Diagnosis not present

## 2016-10-31 DIAGNOSIS — J449 Chronic obstructive pulmonary disease, unspecified: Secondary | ICD-10-CM | POA: Insufficient documentation

## 2016-10-31 DIAGNOSIS — I6523 Occlusion and stenosis of bilateral carotid arteries: Secondary | ICD-10-CM | POA: Insufficient documentation

## 2016-10-31 DIAGNOSIS — G451 Carotid artery syndrome (hemispheric): Secondary | ICD-10-CM | POA: Diagnosis present

## 2016-10-31 DIAGNOSIS — Z87891 Personal history of nicotine dependence: Secondary | ICD-10-CM | POA: Insufficient documentation

## 2016-11-04 ENCOUNTER — Encounter: Payer: Self-pay | Admitting: Cardiovascular Disease

## 2016-11-06 ENCOUNTER — Other Ambulatory Visit: Payer: Self-pay | Admitting: Cardiovascular Disease

## 2016-11-06 DIAGNOSIS — I6529 Occlusion and stenosis of unspecified carotid artery: Secondary | ICD-10-CM

## 2016-11-08 DIAGNOSIS — J441 Chronic obstructive pulmonary disease with (acute) exacerbation: Secondary | ICD-10-CM | POA: Diagnosis not present

## 2016-11-08 DIAGNOSIS — J111 Influenza due to unidentified influenza virus with other respiratory manifestations: Secondary | ICD-10-CM | POA: Diagnosis not present

## 2017-03-29 DIAGNOSIS — F4323 Adjustment disorder with mixed anxiety and depressed mood: Secondary | ICD-10-CM | POA: Diagnosis not present

## 2017-04-06 ENCOUNTER — Ambulatory Visit (INDEPENDENT_AMBULATORY_CARE_PROVIDER_SITE_OTHER): Payer: BLUE CROSS/BLUE SHIELD | Admitting: Family Medicine

## 2017-04-06 ENCOUNTER — Encounter: Payer: Self-pay | Admitting: Family Medicine

## 2017-04-06 VITALS — BP 127/84 | HR 66 | Temp 98.0°F | Resp 18 | Ht 64.96 in | Wt 172.0 lb

## 2017-04-06 DIAGNOSIS — Z0000 Encounter for general adult medical examination without abnormal findings: Secondary | ICD-10-CM

## 2017-04-06 DIAGNOSIS — I6523 Occlusion and stenosis of bilateral carotid arteries: Secondary | ICD-10-CM | POA: Diagnosis not present

## 2017-04-06 DIAGNOSIS — G458 Other transient cerebral ischemic attacks and related syndromes: Secondary | ICD-10-CM

## 2017-04-06 DIAGNOSIS — Z23 Encounter for immunization: Secondary | ICD-10-CM

## 2017-04-06 DIAGNOSIS — E039 Hypothyroidism, unspecified: Secondary | ICD-10-CM | POA: Diagnosis not present

## 2017-04-06 DIAGNOSIS — E785 Hyperlipidemia, unspecified: Secondary | ICD-10-CM | POA: Diagnosis not present

## 2017-04-06 DIAGNOSIS — F418 Other specified anxiety disorders: Secondary | ICD-10-CM

## 2017-04-06 MED ORDER — ZOSTER VAC RECOMB ADJUVANTED 50 MCG/0.5ML IM SUSR: 0.5000 mL | Freq: Once | INTRAMUSCULAR | 1 refills | Status: AC

## 2017-04-06 MED ORDER — CLOPIDOGREL BISULFATE 75 MG PO TABS: 75.0000 mg | ORAL_TABLET | Freq: Every day | ORAL | 1 refills | Status: DC

## 2017-04-06 MED ORDER — CLONAZEPAM 0.5 MG PO TABS
0.5000 mg | ORAL_TABLET | Freq: Two times a day (BID) | ORAL | 1 refills | Status: DC | PRN
Start: 2017-04-06 — End: 2017-10-07

## 2017-04-06 MED ORDER — FLUOXETINE HCL 20 MG PO TABS: 20.0000 mg | ORAL_TABLET | Freq: Every day | ORAL | 1 refills | Status: DC

## 2017-04-06 MED ORDER — ATORVASTATIN CALCIUM 40 MG PO TABS: ORAL_TABLET | ORAL | 1 refills | Status: DC

## 2017-04-06 NOTE — Progress Notes (Signed)
   Subjective:    Patient ID: Bethany Clarke, female    DOB: 11/11/1955, 61 y.o.   MRN: [id number]  HPI    Review of Systems  Constitutional: Negative.   HENT: Negative.   Eyes: Negative.   Respiratory: Negative.   Cardiovascular: Negative.   Gastrointestinal: Negative.   Endocrine: Negative.   Genitourinary: Negative.   Musculoskeletal: Negative.   Skin: Negative.   Allergic/Immunologic: Negative.   Neurological: Negative.   Hematological: Negative.   Psychiatric/Behavioral: Negative.        Objective:   Physical Exam        Assessment & Plan:

## 2017-04-06 NOTE — Progress Notes (Deleted)
hx of anxiety and depression for past 5 years. now followed by therapist Loralie ChampagneAmanda Kirby, started 2 weeks ago.  she wanted to refer her to psychiatry for medication, but exact med unknown.  proaac, klon in past  no SI/HI no etoh, no idu.    thy - Faythe Dingwalltakingn synth  CAr stn sub ste - plavix out 4 mon, rx by berry, lipit 40 qd  not needing PPI  COPD - not tak symbicort,rare use albut, quit smoking 3 yr ago.   exer - walking QD, walking dog, coach girls VB - 3 teams.   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   optho - 3 months ago, has glasses  mm - will schedule

## 2017-04-06 NOTE — Patient Instructions (Addendum)
We recommend that you schedule a mammogram for breast cancer screening. Typically, you do not need a referral to do this. Please contact a local imaging center to schedule your mammogram. [facility]) - [phone] or [phone]  Start Prozac once per day. Klonopin if needed up to twice per day for breakthrough anxiety symptoms. Continue to meet with your therapist and follow-up with me in 3-4 weeks to see how that medication is doing. You may not notice complete improvement with that medication for up to 6 weeks. We can always adjust the dose at that time if needed. Follow up sooner if worse.  Shingles vaccine was sent to your pharmacy. You can check into the cost of that vaccine there, and have given at your  pharmacy.  I refilled your Plavix, but that appears to have been prescribed by cardiologist previously. Continue routine follow-up with cardiologist.   Keeping You Healthy  Get These Tests  Blood Pressure- Have your blood pressure checked by your healthcare provider at least once a year.  Normal blood pressure is 120/80.  Weight- Have your body mass index (BMI) calculated to screen for obesity.  BMI is a measure of body fat based on height and weight.  You can calculate your own BMI at https://www.west-esparza.com/www.[email or web address]/bmi/  Cholesterol- Have your cholesterol checked every year.  Diabetes- Have your blood sugar checked every year if you have high blood pressure, high cholesterol, a family history of diabetes or if you are overweight.  Pap Test - Have a pap test every 1 to 5 years if you have been sexually active.  If you are older than 65 and recent pap tests have been normal you may not need additional pap tests.  In addition, if you have had a hysterectomy  for benign disease additional pap tests are not necessary.  Mammogram-Yearly mammograms are essential for early detection of breast cancer  Screening for Colon Cancer- Colonoscopy starting at age 61.  Screening may begin sooner depending on your family history and other health conditions.  Follow up colonoscopy as directed by your Gastroenterologist.  Screening for Osteoporosis- Screening begins at age 61 with bone density scanning, sooner if you are at higher risk for developing Osteoporosis.  Get these medicines  Calcium with Vitamin D- Your body requires 1200-1500 mg of Calcium a day and [phone] IU of Vitamin D a day.  You can only absorb 500 mg of Calcium at a time therefore Calcium must be taken in 2 or 3 separate doses throughout the day.  Hormones- Hormone therapy has been associated with increased risk for certain cancers and heart disease.  Talk to your healthcare provider about if you need relief from menopausal symptoms.  Aspirin- Ask your healthcare provider about taking Aspirin to prevent Heart Disease and Stroke.  Get these Immuniztions  Flu shot- Every fall  Pneumonia shot- Once after the age of 61; if you are younger ask your healthcare provider if you need a pneumonia shot.  Tetanus- Every ten years.  Zostavax- Once after the age of 61 to prevent shingles.  Take these steps  Don't smoke- Your healthcare provider can help you quit. For tips on how to quit, ask your healthcare provider or go to www.smokefree.gov or call 1-800 QUIT-NOW.  Be physically active- Exercise 5 days a week for a minimum of 30 minutes.  If you are not already physically active, start slow and gradually work up to 30 minutes of moderate physical activity.  Try walking, dancing,  bike riding, swimming, etc.  Eat a healthy diet- Eat a variety of healthy foods such as fruits, vegetables, whole grains, low fat milk, low fat cheeses, yogurt, lean meats, chicken, fish, eggs, dried beans, tofu, etc.  For more information go to www.thenutritionsource.org  Dental visit- Brush and floss teeth twice daily; visit your dentist twice a year.  Eye exam- Visit your Optometrist or Ophthalmologist  yearly.  Drink alcohol in moderation- Limit alcohol intake to one drink or less a day.  Never drink and drive.  Depression- Your emotional health is as important as your physical health.  If you're feeling down or losing interest in things you normally enjoy, please talk to your healthcare provider.  Seat Belts- can save your life; always wear one  Smoke/Carbon Monoxide detectors- These detectors need to be installed on the appropriate level of your home.  Replace batteries at least once a year.  Violence- If anyone is threatening or hurting you, please tell your healthcare provider.  Living Will/ Health care power of attorney- Discuss with your healthcare provider and family.   IF you received an x-ray today, you will receive an invoice from [facility]. Please contact [facility] at [phone] with questions or concerns regarding your invoice.   IF you received labwork today, you will receive an invoice from [facility]. Please contact [facility] at [phone] with questions or concerns regarding your invoice.   Our billing staff will not be able to assist you with questions regarding bills from these companies.  You will be contacted with the lab results as soon as they are available. The fastest way to get your results is to activate your My Chart account. Instructions are located on the last page of this paperwork. If you have not heard from [place] regarding the results in 2 weeks, please contact this office.

## 2017-04-06 NOTE — Progress Notes (Signed)
Subjective:  By signing my name below, I, [name], attest that this documentation has been prepared under the direction and in the presence of Meredith Staggers, MD. Electronically Signed: [name], Scribe. 04/06/2017 , 1:55 PM .  Patient was seen in Room 10 .   Patient ID: Bethany Clarke, female    DOB: Jun 13, 1956, 61 y.o.   MRN: [id number] Chief Complaint  Patient presents with  . Annual Exam   HPI Bethany Clarke is a 61 y.o. female Here for annual physical. Patient has history of hypothyroidism, COPD, GERD, subclavian arterial stenosis followed by cardiology.   History was placed in note out of room, as patient preferred to discuss her history and exam without medical scribe present. Information completed after visit based on verbal history with notes from provider to scribe.   Hypothyroidism Lab Results  Component Value Date   TSH 3.02 12/01/2015   She takes Synthroid QD. No new Side effects.  COPD She's been evaluated by Dr. Sherene Sires with last visit in June 2017. She has COPD GOLD stage II. Smoking cessation was discussed. She was treated with Symbicort 160 2 puffs BID.  She is not taking Symbicort recently, and rare use of Albuterol. She quit smoking 3 years ago.   HLD Lab Results  Component Value Date   CHOL 148 12/01/2015   HDL 59 12/01/2015   LDLCALC 62 12/01/2015   TRIG 133 12/01/2015   CHOLHDL 2.5 12/01/2015   Lab Results  Component Value Date   ALT 44 (H) 12/01/2015   AST 24 12/01/2015   ALKPHOS 62 12/01/2015   BILITOT 0.3 12/01/2015   She takes lipitor 40mg  QD.   Subclavian arterial stenosis - carotid  She is followed by cardiologist, Dr. Allyson Sabal. She had a carotid doppler done on 10/31/16, appears to be stable with 1-39% RICA and 40-59% LICA, both stable. Plan for repeat study in 12 months. She had been treated with Plavix 75mg  QD in the past.   She's been out of her Plavix and Lipitor for 4 months now. Her Plavix was prescribed by Dr.  Allyson Sabal previously.  GERD She's used protonix 40mg  BID in the past. She denies needing PPI recently.   Cancer Screening Colonoscopy: Feb 23, 2009, repeat in 5-10 years, single hyperplastic polyp. Likely 10 year follow up Mammogram: march 2017, no evidence of malignancy; repeat in 1 year. She is going to schedule her mammogram, phone number provided.  Cervical cancer screening: she had a hysterectomy for benign reasons.   Immunizations Immunization History  Administered Date(s) Administered  . Influenza,inj,Quad PF,36+ Mos 12/01/2015  . Pneumococcal Polysaccharide-23 10/17/2003  . Td 10/16/2000  . Tdap 03/15/2007   Shingles: She would like Shingles vaccine; prescription was printed.   Depression Depression screen [facility] 2/9 04/06/2017 12/13/2015 12/01/2015 10/16/2015 04/12/2015  Decreased Interest 0 0 0 0 0  Down, Depressed, Hopeless 0 0 0 0 0  PHQ - 2 Score 0 0 0 0 0   She's had anxiety and depression symptoms for the past 5 years. We have discussed this and treated with Prozac and klonopin in the past, which she felt was okay. She did not recall any problems or reactions with these medications. She is currently followed by her therapist, Loralie Champagne, starting 2 weeks ago. Her therapist wanted her to be referred to psychiatry for medication, but patient wanted to follow up with me for prescribed medication. She denies SI/HI, alcohol use or illicit drug use. She is amendable to start SSRI  and continuing counseling. She was tearful during this discussion.   Vision  Visual Acuity Screening   Right eye Left eye Both eyes  Without correction:     With correction: 20/40 20/40 20/40    She last saw eye doctor 3 months ago, as she wears glasses, specifically for work.   Dentist She sees her dentist once a year.   Exercise She walks daily, as well as walking her dog. 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   Patient Active Problem List   Diagnosis Date Noted  . Chest pain,  atypical 03/17/2016  . Paresthesia 07/14/2013  . Bilateral foot pain 05/01/2013  . Subclavian artery stenosis, left (HCC)   . Subclavian steal syndrome 03/16/2013  . Subclavian arterial stenosis (HCC) 03/11/2013  . Carotid stenosis 03/11/2013  . Hyperlipidemia LDL goal < 70 03/11/2013  . GERD (gastroesophageal reflux disease)   . Hypothyroid   . Dizziness   . Syncope, near   . Heart burn   . Hypothyroidism 05/28/2007  . COPD GOLD II with reversibility  05/28/2007   Past Medical History:  Diagnosis Date  . Atherosclerosis 11/14/2005   carotid US - mild calcific non-stenotic plaque bilaterally  . Carotid bruit 02/05/2009   Doppler - R/L ICAs 0-49% diameter reductin (velocities suggest low-mid range); L subclavian 0-49%  diameter reduction  . Chronic bronchitis (HCC)    "growing up; not anymore" (03/14/2013)  . Colon polyp    hyperplastic  . Costochondritis   . Dizziness   . Emphysema of lung (HCC)   . GERD (gastroesophageal reflux disease)   . H. pylori infection   . Heart burn   . Hemorrhoids   . Hypothyroid   . Lipidemia   . Pneumonia ~ 2009  . Subclavian artery stenosis, left (HCC)    subclavian steal physiology status post PTA and stenting  . Syncope, near    Past Surgical History:  Procedure Laterality Date  . ABDOMINAL HYSTERECTOMY  1999  . BILATERAL UPPER EXTREMITY ANGIOGRAM N/A 03/14/2013   Procedure: BILATERAL UPPER EXTREMITY ANGIOGRAM;  Surgeon: [name], MD;  Location: [facility] CATH LAB;  Service: Cardiovascular;  Laterality: N/A;  . BREAST BIOPSY Right 1990's   "benign" (03/14/2013)  . CARDIAC CATHETERIZATION  03/03/2013  . DILATION AND CURETTAGE OF UTERUS  1980's  . LEFT HEART CATHETERIZATION WITH CORONARY ANGIOGRAM Bilateral 03/03/2013   Procedure: LEFT HEART CATHETERIZATION WITH CORONARY ANGIOGRAM;  Surgeon: Lennette Bihari, MD;  Location: [facility] CATH LAB;  Service: Cardiovascular;  Laterality: Bilateral;  . PERCUTANEOUS STENT INTERVENTION  03/14/2013    Procedure: PERCUTANEOUS STENT INTERVENTION;  Surgeon: [name], MD;  Location: [facility] CATH LAB;  Service: Cardiovascular;;  . SUBCLAVIAN ARTERY STENT Left 03/14/2013    for subclavian steal/claudication (03/14/2013)  . TUBAL LIGATION  1980's   No Known Allergies Prior to Admission medications   Medication Sig Start Date End Date Taking? Authorizing Provider  albuterol (PROVENTIL HFA;VENTOLIN HFA) 108 (90 Base) MCG/ACT inhaler Inhale 1-2 puffs into the lungs every 4 (four) hours as needed for wheezing or shortness of breath. 03/17/16  Yes Nyoka Cowden, MD  aspirin 81 MG tablet Take 81 mg by mouth daily.   Yes [provider]  atorvastatin (LIPITOR) 40 MG tablet TAKE 1 TABLET BY MOUTH EVERY DAY 12/13/15  Yes [name], MD  budesonide-formoterol [facility]) 160-4.5 MCG/ACT inhaler Take 2 puffs first thing in am and then another 2 puffs about 12 hours later. 02/03/16  Yes Sandrea Hughs  B, MD  clopidogrel (PLAVIX) 75 MG tablet TAKE 1 TABLET (75 MG TOTAL) BY MOUTH DAILY. 08/07/16  Yes [name], MD  pantoprazole (PROTONIX) 40 MG tablet TAKE 1 TABLET BY MOUTH EVERY DAY 30-60 MIN BEFORE FIRST MEAL OF THE DAY 05/31/16  Yes Nyoka Cowden, MD  SYNTHROID 100 MCG tablet TAKE 1 TABLET BY MOUTH DAILY BEFORE BREAKFAST 10/13/16  Yes [name], MD   Social History   Social History  . Marital status: Married    Spouse name: N/A  . Number of children: 3  . Years of education: N/A   Occupational History  . [employer]   Social History Main Topics  . Smoking status: Former Smoker    Packs/day: 0.50    Years: 40.00    Types: Cigarettes    Quit date: 02/11/2013  . Smokeless tobacco: Never Used  . Alcohol use No  . Drug use: No  . Sexual activity: Yes     Comment: number of sex partners in the last 12 months  1   Other Topics Concern  . Not on file   Social History Narrative   Daily caffeine. Married. Education: [employer]. Exercise: No.    Review of Systems 13 point ROS - negative    Objective:   Physical Exam  Constitutional: She is oriented to person, place, and time. She appears well-developed and well-nourished.  HENT:  Head: Normocephalic and atraumatic.  Right Ear: External ear normal.  Left Ear: External ear normal.  Mouth/Throat: Oropharynx is clear and moist.  Eyes: Conjunctivae are normal. Pupils are equal, round, and reactive to light.  Neck: Normal range of motion. Neck supple. No thyromegaly present.  Cardiovascular: Normal rate, regular rhythm, normal heart sounds and intact distal pulses.   No murmur heard. Pulmonary/Chest: Effort normal and breath sounds normal. No respiratory distress. She has no wheezes.  Abdominal: Soft. Bowel sounds are normal. There is no tenderness.  Musculoskeletal: Normal range of motion. She exhibits no edema or tenderness.  Lymphadenopathy:    She has no cervical adenopathy.  Neurological: She is alert and oriented to person, place, and time.  Skin: Skin is warm and dry. No rash noted.  Psychiatric: She has a normal mood and affect. Her behavior is normal. Thought content normal.     Vitals:   04/06/17 1334  BP: 127/84  Pulse: 66  Resp: 18  Temp: 98 F (36.7 C)  TempSrc: Oral  SpO2: 95%  Weight: 172 lb (78 kg)  Height: 5' 4.96" (1.65 m)      Assessment & Plan:   Bethany Clarke is a 61 y.o. female Annual physical exam  - -anticipatory guidance as below in AVS, screening labs above. Health maintenance items as above in HPI discussed/recommended as applicable.   Depression with anxiety - Plan: FLUoxetine (PROZAC) 20 MG tablet, clonazePAM (KLONOPIN) 0.5 MG tablet  - Start Prozac 20 mg daily as previous as tolerated. Continue counseling. Klonopin 0.5 mg twice a day when necessary. Discussed using this medication as a bridge, short-term or for breakthrough anxiety, not monotherapy.  Hyperlipidemia, unspecified hyperlipidemia type - Plan: Comprehensive  metabolic panel, atorvastatin (LIPITOR) 40 MG tablet  -Tolerating Lipitor, continue same dose. Refilled. CMP pending   Bilateral carotid artery stenosis Subclavian steal syndrome - Plan: Lipid panel, clopidogrel (PLAVIX) 75 MG tablet  -Continue Plavix, statin, routine follow-up with cardiology  Hypothyroidism, unspecified type - Plan: TSH  -Check TSH, continue same dose of Synthroid for now.  Need for shingles vaccine - Plan: Zoster Vac Recomb Adjuvanted [facility]) injection  -Prescription provided for her pharmacy to dispense/inject  Meds ordered this encounter  Medications  . FLUoxetine (PROZAC) 20 MG tablet    Sig: Take 1 tablet (20 mg total) by mouth daily.    Dispense:  30 tablet    Refill:  1  . clonazePAM (KLONOPIN) 0.5 MG tablet    Sig: Take 1 tablet (0.5 mg total) by mouth 2 (two) times daily as needed for anxiety.    Dispense:  20 tablet    Refill:  1  . clopidogrel (PLAVIX) 75 MG tablet    Sig: Take 1 tablet (75 mg total) by mouth daily.    Dispense:  90 tablet    Refill:  1  . atorvastatin (LIPITOR) 40 MG tablet    Sig: TAKE 1 TABLET BY MOUTH EVERY DAY    Dispense:  90 tablet    Refill:  1  . Zoster Vac Recomb Adjuvanted [facility]) injection    Sig: Inject 0.5 mLs into the muscle once. Repeat injection once in 2-6 months.    Dispense:  0.5 mL    Refill:  1   Patient Instructions   We recommend that you schedule a mammogram for breast cancer screening. Typically, you do not need a referral to do this. Please contact a local imaging center to schedule your mammogram. [facility]) - [phone] or [phone]  Start Prozac once per day. Klonopin if needed up to twice per day for breakthrough anxiety symptoms. Continue to meet with your therapist and follow-up with me in 3-4 weeks to see how that medication is doing. You may not notice complete improvement with that medication for up to 6 weeks. We can always adjust the dose at that  time if needed. Follow up sooner if worse.  Shingles vaccine was sent to your pharmacy. You can check into the cost of that vaccine there, and have given at your  pharmacy.  I refilled your Plavix, but that appears to have been prescribed by cardiologist previously. Continue routine follow-up with cardiologist.   Keeping You Healthy  Get These Tests  Blood Pressure- Have your blood pressure checked by your healthcare provider at least once a year.  Normal blood pressure is 120/80.  Weight- Have your body mass index (BMI) calculated to screen for obesity.  BMI is a measure of body fat based on height and weight.  You can calculate your own BMI at https://www.west-esparza.com/  Cholesterol- Have your cholesterol checked every year.  Diabetes- Have your blood sugar checked every year if you have high blood pressure, high cholesterol, a family history of diabetes or if you are overweight.  Pap Test - Have a pap test every 1 to 5 years if you have been sexually active.  If you are older than 65 and recent pap tests have been normal you may not need additional pap tests.  In addition, if you have had a hysterectomy  for benign disease additional pap tests are not necessary.  Mammogram-Yearly mammograms are essential for early detection of breast cancer  Screening for Colon Cancer- Colonoscopy starting at age 40. Screening may begin sooner depending on your family history and other health conditions.  Follow up colonoscopy as directed by your Gastroenterologist.  Screening for Osteoporosis- Screening begins at age 82 with bone density scanning, sooner if you are at higher risk for developing Osteoporosis.  Get these medicines  Calcium with Vitamin D-  Your body requires 1200-1500 mg of Calcium a day and [phone] IU of Vitamin D a day.  You can only absorb 500 mg of Calcium at a time therefore Calcium must be taken in 2 or 3 separate doses throughout the day.  Hormones- Hormone therapy has been  associated with increased risk for certain cancers and heart disease.  Talk to your healthcare provider about if you need relief from menopausal symptoms.  Aspirin- Ask your healthcare provider about taking Aspirin to prevent Heart Disease and Stroke.  Get these Immuniztions  Flu shot- Every fall  Pneumonia shot- Once after the age of 61; if you are younger ask your healthcare provider if you need a pneumonia shot.  Tetanus- Every ten years.  Zostavax- Once after the age of 61 to prevent shingles.  Take these steps  Don't smoke- Your healthcare provider can help you quit. For tips on how to quit, ask your healthcare provider or go to www.smokefree.gov or call 1-800 QUIT-NOW.  Be physically active- Exercise 5 days a week for a minimum of 30 minutes.  If you are not already physically active, start slow and gradually work up to 30 minutes of moderate physical activity.  Try walking, dancing, bike riding, swimming, etc.  Eat a healthy diet- Eat a variety of healthy foods such as fruits, vegetables, whole grains, low fat milk, low fat cheeses, yogurt, lean meats, chicken, fish, eggs, dried beans, tofu, etc.  For more information go to www.thenutritionsource.org  Dental visit- Brush and floss teeth twice daily; visit your dentist twice a year.  Eye exam- Visit your Optometrist or Ophthalmologist yearly.  Drink alcohol in moderation- Limit alcohol intake to one drink or less a day.  Never drink and drive.  Depression- Your emotional health is as important as your physical health.  If you're feeling down or losing interest in things you normally enjoy, please talk to your healthcare provider.  Seat Belts- can save your life; always wear one  Smoke/Carbon Monoxide detectors- These detectors need to be installed on the appropriate level of your home.  Replace batteries at least once a year.  Violence- If anyone is threatening or hurting you, please tell your healthcare provider.  Living  Will/ Health care power of attorney- Discuss with your healthcare provider and family.   IF you received an x-ray today, you will receive an invoice from [facility]. Please contact [facility] at [phone] with questions or concerns regarding your invoice.   IF you received labwork today, you will receive an invoice from [facility]. Please contact [facility] at [phone] with questions or concerns regarding your invoice.   Our billing staff will not be able to assist you with questions regarding bills from these companies.  You will be contacted with the lab results as soon as they are available. The fastest way to get your results is to activate your My Chart account. Instructions are located on the last page of this paperwork. If you have not heard from us regarding the results in 2 weeks, please contact this office.       I personally performed the services described in this documentation, which was scribed in my presence. The recorded information has been reviewed and considered for accuracy and completeness, addended by me as needed, and agree with information above.  Signed,   Meredith StaggersJeffrey Kalie Cabral, MD [facility].  04/08/17 10:00 PM

## 2017-04-07 LAB — LIPID PANEL
CHOL/HDL RATIO: 2.4 ratio (ref 0.0–4.4)
Cholesterol, Total: 128 mg/dL (ref 100–199)
HDL: 53 mg/dL (ref >39)
LDL CALC: 56 mg/dL (ref 0–99)
TRIGLYCERIDES: 94 mg/dL (ref 0–149)
VLDL Cholesterol Cal: 19 mg/dL (ref 5–40)

## 2017-04-07 LAB — COMPREHENSIVE METABOLIC PANEL
ALT: 36 IU/L — ABNORMAL HIGH (ref 0–32)
AST: 24 IU/L (ref 0–40)
Albumin/Globulin Ratio: 1.7 (ref 1.2–2.2)
Albumin: 4.9 g/dL — ABNORMAL HIGH (ref 3.6–4.8)
Alkaline Phosphatase: 91 IU/L (ref 39–117)
BUN/Creatinine Ratio: 18 (ref 12–28)
BUN: 12 mg/dL (ref 8–27)
Bilirubin Total: 0.3 mg/dL (ref 0.0–1.2)
CALCIUM: 10.3 mg/dL (ref 8.7–10.3)
CO2: 23 mmol/L (ref 20–29)
CREATININE: 0.66 mg/dL (ref 0.57–1.00)
Chloride: 99 mmol/L (ref 96–106)
GFR, EST AFRICAN AMERICAN: 111 mL/min/{1.73_m2} (ref >59)
GFR, EST NON AFRICAN AMERICAN: 96 mL/min/{1.73_m2} (ref >59)
GLOBULIN, TOTAL: 2.9 g/dL (ref 1.5–4.5)
Glucose: 74 mg/dL (ref 65–99)
Potassium: 5.1 mmol/L (ref 3.5–5.2)
Sodium: 138 mmol/L (ref 134–144)
TOTAL PROTEIN: 7.8 g/dL (ref 6.0–8.5)

## 2017-04-07 LAB — TSH: TSH: 0.384 u[IU]/mL — AB (ref 0.450–4.500)

## 2017-04-11 ENCOUNTER — Ambulatory Visit: Payer: BLUE CROSS/BLUE SHIELD | Admitting: Physician Assistant

## 2017-04-11 DIAGNOSIS — S90869A Insect bite (nonvenomous), unspecified foot, initial encounter: Secondary | ICD-10-CM | POA: Diagnosis not present

## 2017-04-11 DIAGNOSIS — Z23 Encounter for immunization: Secondary | ICD-10-CM | POA: Diagnosis not present

## 2017-04-11 DIAGNOSIS — W57XXXA Bitten or stung by nonvenomous insect and other nonvenomous arthropods, initial encounter: Secondary | ICD-10-CM | POA: Diagnosis not present

## 2017-04-12 DIAGNOSIS — F4323 Adjustment disorder with mixed anxiety and depressed mood: Secondary | ICD-10-CM | POA: Diagnosis not present

## 2017-04-14 ENCOUNTER — Other Ambulatory Visit: Payer: Self-pay | Admitting: Family Medicine

## 2017-04-14 DIAGNOSIS — E038 Other specified hypothyroidism: Secondary | ICD-10-CM

## 2017-04-16 ENCOUNTER — Other Ambulatory Visit: Payer: Self-pay | Admitting: Family Medicine

## 2017-04-16 DIAGNOSIS — E039 Hypothyroidism, unspecified: Secondary | ICD-10-CM

## 2017-04-16 NOTE — Progress Notes (Signed)
Borderline low TSH, plan for lab only visit.

## 2017-04-16 NOTE — Telephone Encounter (Signed)
See lab work and my chart message to patient.

## 2017-04-30 ENCOUNTER — Encounter: Payer: Self-pay | Admitting: Emergency Medicine

## 2017-04-30 DIAGNOSIS — F4323 Adjustment disorder with mixed anxiety and depressed mood: Secondary | ICD-10-CM | POA: Diagnosis not present

## 2017-05-22 DIAGNOSIS — F4323 Adjustment disorder with mixed anxiety and depressed mood: Secondary | ICD-10-CM | POA: Diagnosis not present

## 2017-06-01 ENCOUNTER — Other Ambulatory Visit: Payer: Self-pay | Admitting: Family Medicine

## 2017-06-01 DIAGNOSIS — F418 Other specified anxiety disorders: Secondary | ICD-10-CM

## 2017-06-06 NOTE — Telephone Encounter (Signed)
Please call and schedule patient and appt within 1 month with Dr Greene.  

## 2017-06-07 NOTE — Telephone Encounter (Signed)
mychart message sent to pt about making an apt for more refills °

## 2017-06-21 DIAGNOSIS — F4323 Adjustment disorder with mixed anxiety and depressed mood: Secondary | ICD-10-CM | POA: Diagnosis not present

## 2017-07-03 ENCOUNTER — Encounter: Payer: Self-pay | Admitting: Family Medicine

## 2017-07-03 ENCOUNTER — Ambulatory Visit (INDEPENDENT_AMBULATORY_CARE_PROVIDER_SITE_OTHER): Payer: BLUE CROSS/BLUE SHIELD | Admitting: Family Medicine

## 2017-07-03 ENCOUNTER — Other Ambulatory Visit: Payer: Self-pay | Admitting: Physician Assistant

## 2017-07-03 VITALS — BP 132/81 | HR 76 | Temp 97.6°F | Resp 17 | Ht 65.5 in | Wt 160.0 lb

## 2017-07-03 DIAGNOSIS — E039 Hypothyroidism, unspecified: Secondary | ICD-10-CM | POA: Diagnosis not present

## 2017-07-03 DIAGNOSIS — G458 Other transient cerebral ischemic attacks and related syndromes: Secondary | ICD-10-CM | POA: Diagnosis not present

## 2017-07-03 DIAGNOSIS — E785 Hyperlipidemia, unspecified: Secondary | ICD-10-CM | POA: Diagnosis not present

## 2017-07-03 DIAGNOSIS — F418 Other specified anxiety disorders: Secondary | ICD-10-CM

## 2017-07-03 DIAGNOSIS — R252 Cramp and spasm: Secondary | ICD-10-CM | POA: Diagnosis not present

## 2017-07-03 MED ORDER — ATORVASTATIN CALCIUM 40 MG PO TABS: ORAL_TABLET | ORAL | 0 refills | Status: DC

## 2017-07-03 MED ORDER — CLOPIDOGREL BISULFATE 75 MG PO TABS: 75.0000 mg | ORAL_TABLET | Freq: Every day | ORAL | 0 refills | Status: DC

## 2017-07-03 MED ORDER — FLUOXETINE HCL 20 MG PO TABS: 20.0000 mg | ORAL_TABLET | Freq: Every day | ORAL | 1 refills | Status: DC

## 2017-07-03 NOTE — Patient Instructions (Addendum)
Continue counseling, prozac at same dose for now and klonopin if needed.  Recheck in 6 months.  You should have enough refills of your other medications also to last until visit in 6 months.  If thyroid test is low again today, we can place a lab only order (without office visit) to verify that test if needed. Continue same dose of synthroid for now.   For muscle cramps, I will check a magnesium level and a muscle enzyme test. Your previous electrolytes in June were normal. Try stretches twice per day, especially after physical activity and make sure you're drinking plenty of fluids throughout the day. If those symptoms persist, would recommend discussing with your cardiologist, but currently the symptoms do not sound like they are due to circulation.  Return to the clinic or go to the nearest emergency room if any of your symptoms worsen or new symptoms occur.  IF you received an x-ray today, you will receive an invoice from [facility]. Please contact [facility] at [phone] with questions or concerns regarding your invoice.   IF you received labwork today, you will receive an invoice from [facility]. Please contact [facility] at [phone] with questions or concerns regarding your invoice.   Our billing staff will not be able to assist you with questions regarding bills from these companies.  You will be contacted with the lab results as soon as they are available. The fastest way to get your results is to activate your My Chart account. Instructions are located on the last page of this paperwork. If you have not heard from [place] regarding the results in 2 weeks, please contact this office.

## 2017-07-03 NOTE — Progress Notes (Signed)
Subjective:  This chart was scribed for [name], MD by [name], at Primary Care at [facility].  This patient was seen in room 11 and the patient's care was started at 10:18 AM.   Chief Complaint  Patient presents with  . Follow-up  . Medication Refill    lipitor prozac synthroid      Patient ID: SIMRIT GOHLKE, female    DOB: January 26, 1956, 61 y.o.   MRN: [id number]  HPI HPI Comments: RODNEY WIGGER is a 61 y.o. female who presents to Primary Care at [facility] for a follow up.    Hyperlipidemia: Takes Lipitor 40 mg QD. --- Patient is compliant with this and would like a refill today.  Lab Results  Component Value Date   CHOL 128 04/06/2017   HDL 53 04/06/2017   LDLCALC 56 04/06/2017   TRIG 94 04/06/2017   CHOLHDL 2.4 04/06/2017   Lab Results  Component Value Date   ALT 36 (H) 04/06/2017   AST 24 04/06/2017   ALKPHOS 91 04/06/2017   BILITOT 0.3 04/06/2017    History of subclavian artery stenosis: Cardiologist is Dr. Gery Pray.Takes plavix 75 mg QD. As ongoing routine follow-up with cardiology   COPD: Quit smoking three years ago. Previously treated with Symbicort, rare use of albuterol.  She was not using Symbicort when last dicussed in June.     Depression with anxiety: Restarted Prozac 20 mg QD in June, reccommended to continue counseling. Klonopin short term as needed.---- Patient was last on Prozac about 10 years ago.  She feels that it "takes the edge off" and does not feel teary as much throughout the day. She took 1 Klonopin a couple of weeks ago and denies any difficulties with sleep. Denies any thoughts of suicide or hurting/harming herself. She has been going to therapist Loralie Champagne (once per month)-- who also thought patient needed to be on daily medication for her anxiety/depression.  Patient does have a personal matter that she does not want to talk about in the office as it brings her to tears.  She does feel comfortable speaking to her  therapist about it and states that this is the main reason why she goes to see her.   Hypothyroidism: She is on Synthroid 100 mcg QD. Advised in July to return in 4-6 weeks for repeat testing for free T3 T4, this has not yet been preformed. 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  She has to "make" herself get out of bed everyday and does not relate this to her anxiety or depression.  She thinks that she is just "too tired" as she works a full time job. She would like a refill of her Synthroid today.  Lab Results  Component Value Date   TSH 0.384 (L) 04/06/2017    Leg/toe/thigh cramps: Patient has been having leg/toe and inner thigh cramps for the past six months.  Her inner thigh cramps are the worst during intercourse.  Her foot/toe cramps are at random times even when she is not taking part in any activities like walking. Patient describes her toes as curling in when she is sleeping.  She has had to sleep with her legs upwards as it was bothering her so much. She has tried changing shoes but denies any relief.  Patient makes sure to stretch and warm up before any kind of activity. She went to neurology  in the past as she was having stabbing pains in her feet but there were no abnormalities found.    Patient Active Problem List   Diagnosis Date Noted  . Chest pain, atypical 03/17/2016  . Paresthesia 07/14/2013  . Bilateral foot pain 05/01/2013  . Subclavian artery stenosis, left (HCC)   . Subclavian steal syndrome 03/16/2013  . Subclavian arterial stenosis (HCC) 03/11/2013  . Carotid stenosis 03/11/2013  . Hyperlipidemia LDL goal < 70 03/11/2013  . GERD (gastroesophageal reflux disease)   . Hypothyroid   . Dizziness   . Syncope, near   . Heart burn   . Hypothyroidism 05/28/2007  . COPD GOLD II with reversibility  05/28/2007   Past Medical History:  Diagnosis Date  . Atherosclerosis  11/14/2005   carotid US - mild calcific non-stenotic plaque bilaterally  . Carotid bruit 02/05/2009   Doppler - R/L ICAs 0-49% diameter reductin (velocities suggest low-mid range); L subclavian 0-49%  diameter reduction  . Chronic bronchitis (HCC)    "growing up; not anymore" (03/14/2013)  . Colon polyp    hyperplastic  . Costochondritis   . Dizziness   . Emphysema of lung (HCC)   . GERD (gastroesophageal reflux disease)   . H. pylori infection   . Heart burn   . Hemorrhoids   . Hypothyroid   . Lipidemia   . Pneumonia ~ 2009  . Subclavian artery stenosis, left (HCC)    subclavian steal physiology status post PTA and stenting  . Syncope, near    Past Surgical History:  Procedure Laterality Date  . ABDOMINAL HYSTERECTOMY  1999  . BILATERAL UPPER EXTREMITY ANGIOGRAM N/A 03/14/2013   Procedure: BILATERAL UPPER EXTREMITY ANGIOGRAM;  Surgeon: [name], MD;  Location: [facility] CATH LAB;  Service: Cardiovascular;  Laterality: N/A;  . BREAST BIOPSY Right 1990's   "benign" (03/14/2013)  . CARDIAC CATHETERIZATION  03/03/2013  . DILATION AND CURETTAGE OF UTERUS  1980's  . LEFT HEART CATHETERIZATION WITH CORONARY ANGIOGRAM Bilateral 03/03/2013   Procedure: LEFT HEART CATHETERIZATION WITH CORONARY ANGIOGRAM;  Surgeon: Lennette Bihari, MD;  Location: [facility] LAB;  Service: Cardiovascular;  Laterality: Bilateral;  . PERCUTANEOUS STENT INTERVENTION  03/14/2013   Procedure: PERCUTANEOUS STENT INTERVENTION;  Surgeon: [name], MD;  Location: [facility] CATH LAB;  Service: Cardiovascular;;  . SUBCLAVIAN ARTERY STENT Left 03/14/2013    for subclavian steal/claudication (03/14/2013)  . TUBAL LIGATION  1980's   No Known Allergies Prior to Admission medications   Medication Sig Start Date End Date Taking? Authorizing Provider  albuterol (PROVENTIL HFA;VENTOLIN HFA) 108 (90 Base) MCG/ACT inhaler Inhale 1-2 puffs into the lungs every 4 (four) hours as needed for wheezing or shortness of breath. 03/17/16  Yes  Nyoka Cowden, MD  aspirin 81 MG tablet Take 81 mg by mouth daily.   Yes [provider]  atorvastatin (LIPITOR) 40 MG tablet TAKE 1 TABLET BY MOUTH EVERY DAY 04/06/17  Yes [name], MD  budesonide-formoterol [facility]) 160-4.5 MCG/ACT inhaler Take 2 puffs first thing in am and then another 2 puffs about 12 hours later. 02/03/16  Yes Nyoka Cowden, MD  clonazePAM (KLONOPIN) 0.5 MG tablet Take 1 tablet (0.5 mg total) by mouth 2 (two) times daily as needed for anxiety. 04/06/17  Yes [name], MD  clopidogrel (PLAVIX) 75 MG tablet Take 1 tablet (75 mg total) by mouth daily. 04/06/17  Yes [name], MD  FLUoxetine (PROZAC) 20 MG tablet TAKE 1 TABLET BY MOUTH EVERY DAY  07/03/17  Yes Weber, Dema Severin, PA-C  SYNTHROID 100 MCG tablet TAKE 1 TABLET BY MOUTH DAILY BEFORE BREAKFAST 04/16/17  Yes [name], MD   Social History   Social History  . Marital status: Married    Spouse name: N/A  . Number of children: 3  . Years of education: N/A   Occupational History  . [employer]   Social History Main Topics  . Smoking status: Former Smoker    Packs/day: 0.50    Years: 40.00    Types: Cigarettes    Quit date: 02/11/2013  . Smokeless tobacco: Never Used  . Alcohol use No  . Drug use: No  . Sexual activity: Yes     Comment: number of sex partners in the last 12 months  1   Other Topics Concern  . Not on file   Social History Narrative   Daily caffeine. Married. Education: [employer]. Exercise: No.      Review of Systems  Constitutional: Negative for chills and fever.  Respiratory: Negative for cough, choking and shortness of breath.   Gastrointestinal: Negative for nausea and vomiting.  Musculoskeletal:       Cramping  Neurological: Negative for syncope and speech difficulty.  Psychiatric/Behavioral: Positive for dysphoric mood. Negative for self-injury, sleep disturbance and suicidal ideas.       Objective:   Physical  Exam  Constitutional: She is oriented to person, place, and time. She appears well-developed and well-nourished. No distress.  HENT:  Head: Normocephalic and atraumatic.  Eyes: Pupils are equal, round, and reactive to light. Conjunctivae and EOM are normal.  Neck: Normal range of motion. Carotid bruit is not present.  Cardiovascular: Normal rate, regular rhythm, normal heart sounds and intact distal pulses.   No apparent joint swelling.  No lower extremity edema  Pulmonary/Chest: Effort normal and breath sounds normal. No respiratory distress.  Abdominal: Soft. She exhibits no pulsatile midline mass. There is no tenderness.  Musculoskeletal: Normal range of motion.  Plantar fascia is non tender  Pain free hip and knee, good range of motion.   Neurological: She is alert and oriented to person, place, and time.  Skin: Skin is warm and dry.  Psychiatric: She has a normal mood and affect. Her behavior is normal.  Vitals reviewed.   Vitals:   07/03/17 0936  BP: 132/81  Pulse: 76  Resp: 17  Temp: 97.6 F (36.4 C)  TempSrc: Oral  SpO2: 98%  Weight: 160 lb (72.6 kg)  Height: 5' 5.5" (1.664 m)        Assessment & Plan:   POSIE LILLIBRIDGE is a 61 y.o. female Depression with anxiety - Plan: FLUoxetine (PROZAC) 20 MG tablet  -Improved with Prozac and counseling. Continue same dose of Prozac, has Klonopin if needed, okay for her to continue using this on a when necessary basis also recommended to continue counseling.  Refills of Prozac for 6 months until recheck, and okay to refill Klonopin for that time period as well if needed.  Hypothyroidism, unspecified type - Plan: TSH+T4F+T3Free  -Borderline TSH previously. I clarified my previous lab letter note indicating that the TSH abnormality may be borderline or could be that she is on too strong of a dose of medication, not that I was implying that she had overtaken medication.   -Recheck TSH, free T3, free T4. No change in meds for  now. She did request that we repeat that TSH/free T3/free T4 if this lab was borderline as  well.  If there continues to be a borderline level and there are questions regarding dosing changes, we certainly can consult with endocrinology.   Foot cramps - Plan: CK, Magnesium Leg cramps - Plan: CK, Magnesium  -Intermittent cramps, previous electrolytes look okay.   - check CPK, magnesium. Discussed stretching especially after exercise or physical activity.  - If persistent symptoms, can also discuss with her cardiologist, but description in office does not sound typical of claudication. RTC precautions if worsening.  Hyperlipidemia, unspecified hyperlipidemia type - Plan: atorvastatin (LIPITOR) 40 MG tablet  -Tolerating Lipitor, continue same dose.   Subclavian steal syndrome - Plan: clopidogrel (PLAVIX) 75 MG tablet  -Continue Plavix, routine follow-up with cardiology.  Meds ordered this encounter  Medications  . FLUoxetine (PROZAC) 20 MG tablet    Sig: Take 1 tablet (20 mg total) by mouth daily.    Dispense:  90 tablet    Refill:  1  . atorvastatin (LIPITOR) 40 MG tablet    Sig: TAKE 1 TABLET BY MOUTH EVERY DAY    Dispense:  90 tablet    Refill:  0  . clopidogrel (PLAVIX) 75 MG tablet    Sig: Take 1 tablet (75 mg total) by mouth daily.    Dispense:  90 tablet    Refill:  0   Patient Instructions   Continue counseling, prozac at same dose for now and klonopin if needed.  Recheck in 6 months.  You should have enough refills of your other medications also to last until visit in 6 months.  If thyroid test is low again today, we can place a lab only order (without office visit) to verify that test if needed. Continue same dose of synthroid for now.   For muscle cramps, I will check a magnesium level and a muscle enzyme test. Your previous electrolytes in June were normal. Try stretches twice per day, especially after physical activity and make sure you're drinking plenty of fluids  throughout the day. If those symptoms persist, would recommend discussing with your cardiologist, but currently the symptoms do not sound like they are due to circulation.  Return to the clinic or go to the nearest emergency room if any of your symptoms worsen or new symptoms occur.  IF you received an x-ray today, you will receive an invoice from [facility]. Please contact [facility] at [phone] with questions or concerns regarding your invoice.   IF you received labwork today, you will receive an invoice from [facility]. Please contact [facility] at [phone] with questions or concerns regarding your invoice.   Our billing staff will not be able to assist you with questions regarding bills from these companies.  You will be contacted with the lab results as soon as they are available. The fastest way to get your results is to activate your My Chart account. Instructions are located on the last page of this paperwork. If you have not heard from [place] regarding the results in 2 weeks, please contact this office.       I personally performed the services described in this documentation, which was scribed in my presence. The recorded information has been reviewed and considered for accuracy and completeness, addended by me as needed, and agree with information above.  Signed,   Meredith Staggers, MD Primary Care at [facility].  07/06/17 5:12 PM

## 2017-07-04 DIAGNOSIS — E039 Hypothyroidism, unspecified: Secondary | ICD-10-CM | POA: Diagnosis not present

## 2017-07-04 LAB — MAGNESIUM: MAGNESIUM: 2.3 mg/dL (ref 1.6–2.3)

## 2017-07-04 LAB — CK: Total CK: 175 U/L — ABNORMAL HIGH (ref 24–173)

## 2017-07-05 ENCOUNTER — Ambulatory Visit: Payer: BLUE CROSS/BLUE SHIELD | Admitting: Family Medicine

## 2017-07-05 LAB — TSH+T4F+T3FREE
FREE T4: 1.53 ng/dL (ref 0.82–1.77)
T3, Free: 2.2 pg/mL (ref 2.0–4.4)
TSH: 1.3 u[IU]/mL (ref 0.450–4.500)

## 2017-07-10 DIAGNOSIS — F4323 Adjustment disorder with mixed anxiety and depressed mood: Secondary | ICD-10-CM | POA: Diagnosis not present

## 2017-07-11 ENCOUNTER — Other Ambulatory Visit: Payer: Self-pay | Admitting: Family Medicine

## 2017-07-11 DIAGNOSIS — E038 Other specified hypothyroidism: Secondary | ICD-10-CM

## 2017-07-11 MED ORDER — LEVOTHYROXINE SODIUM 100 MCG PO TABS: ORAL_TABLET | ORAL | 1 refills | Status: DC

## 2017-08-01 ENCOUNTER — Other Ambulatory Visit: Payer: Self-pay | Admitting: Physician Assistant

## 2017-08-01 DIAGNOSIS — F418 Other specified anxiety disorders: Secondary | ICD-10-CM

## 2017-09-11 ENCOUNTER — Ambulatory Visit (INDEPENDENT_AMBULATORY_CARE_PROVIDER_SITE_OTHER): Payer: BLUE CROSS/BLUE SHIELD | Admitting: Cardiovascular Disease

## 2017-09-11 ENCOUNTER — Encounter: Payer: Self-pay | Admitting: Cardiovascular Disease

## 2017-09-11 VITALS — BP 114/68 | HR 79 | Ht 65.0 in | Wt 156.0 lb

## 2017-09-11 DIAGNOSIS — E039 Hypothyroidism, unspecified: Secondary | ICD-10-CM | POA: Diagnosis not present

## 2017-09-11 DIAGNOSIS — E785 Hyperlipidemia, unspecified: Secondary | ICD-10-CM | POA: Diagnosis not present

## 2017-09-11 DIAGNOSIS — I771 Stricture of artery: Secondary | ICD-10-CM | POA: Diagnosis not present

## 2017-09-11 DIAGNOSIS — R079 Chest pain, unspecified: Secondary | ICD-10-CM

## 2017-09-11 DIAGNOSIS — I6522 Occlusion and stenosis of left carotid artery: Secondary | ICD-10-CM

## 2017-09-11 DIAGNOSIS — Z79899 Other long term (current) drug therapy: Secondary | ICD-10-CM

## 2017-09-11 DIAGNOSIS — R9431 Abnormal electrocardiogram [ECG] [EKG]: Secondary | ICD-10-CM | POA: Diagnosis not present

## 2017-09-11 MED ORDER — METOPROLOL TARTRATE 25 MG PO TABS: 12.5000 mg | ORAL_TABLET | Freq: Two times a day (BID) | ORAL | 6 refills | Status: DC

## 2017-09-11 NOTE — Progress Notes (Signed)
Patient ID: Bethany Clarke, female   DOB: 1955/10/29, 61 y.o.   MRN: [id number]    HPI: Bethany Clarke is a 61 y.o. female who presents for a 26 month follow-up cardiology evaluation. I last saw her in September 2016.  Bethany Clarke  has a history of hypothyroidism and hyperlipidemia.  In September 2013, she  had an endoscopy for epigastric discomfort and was treated with Nexium. She also was told to have sludge in her gallbladder and some inflammation of her bile duct, for which she has seen Dr. Henrene Pastor. The patient has a very strong family history for coronary artery disease with a sister, who underwent CABG surgery at age 55, as well as a father, who died at a young age with cancer but also had heart disease, and also many family members with stroke and heart disease. The patient had a 44 year history of tobacco use, having started at age 49. She has noticed recurrent episodes of epigastric and somewhat right-sided chest discomfort.  When I saw her back then  He complained of a progressive decline in activity with development of exertional shortness of breath  And also had awakened from sleep with chest pain.  She ultimately underwentcardiac catheterization was on Mar 03, 2013  Which demonstratednormal LV function without definite obstructive disease. Her left artery system was entirely normal. She did have mild ostial calcification of her right coronary artery without stenosis.   When I saw her initially on Feb 19, 2013 she had a 30 mm blood pressure differential between her right and left arm. Subsequent carotid duplex imaging suggested proximal left subclavian occlusion. She did have 0-49% stenosis in the right internal carotid artery and had progressive stenosis in her left mid internal carotid now showing a 50-69% diameter reduction. She had retrograde flow in her left vertebral artery suggestive of subclavian steal. In retrospect, she does admit to episodes of dizziness. Upper extremity duplex  imaging revealed a 38 mm reduction in left brachial artery pressure. A 2-D echo Doppler study showed ejection fraction of 55-60% with normal diastolic parameters and mildly thickened mitral valve leaflets appear.   I referred Bethany Clarke to Dr. Gwenlyn Clarke and she ultimately underwent aortic arch angiography and bilateral subclavian angiography which revealed a 99% proximal left subclavian artery stenosis with retrograde filling of the left vertebral system. Her subclavian artery was successfully stented.  She had marked improvement in her subclavian steal symptoms and her left upper extremity Doppler normalized.  She had stopped smoking.  She had gained weight.  Follow-up Doppler studies in December 2015 were not significantly changed since the year previously in the right brachial pressure was 143, and left brachial pressure 138 arguing against upper extremity inflow disease. Carotid studies in December 2015 showed mild plaque without high-grade stenosis.  Her left vertebral artery was not visualized.  Since I last saw her in September 2016, she had remained fairly stable.  She underwent a carotid Doppler study in January 2018 which showed heterogeneous plaque bilaterally.  There was stable 1-39% right internal carotid stenoses and stable 40-59% left internal carotid.  She had a patent left subclavian artery stent without evidence for restenosis.  She had a normal right subclavian artery, and patent vertebral arteries with antegrade flow.  She quit smoking 4 years ago.  She had gained weight after quitting smoking but in May started a ketone diet and has lost 40 pounds since May.  She has been active and walks on a treadmill and denies any  exertionally precipitated chest tightness or significant dyspnea.  Scheduling evening, she had experienced an episode of lower sternal mid epigastric discomfort which lasted approximately 5 minutes, but was associated with intense pain.  She had another episode of chest pain 2  days later which also was nonexertional.  She states yesterday she walked 2 miles on a treadmill and did not have chest tightness.  Presently, Mr. Bethany Clarke denies chest pain.  She has had some issues of right upper quadrant discomfort.  In the past.  She was noted to have sludge in her gallbladder. She is unaware of palpitations.  She has been taking atorvastatin 40 mg for hyperlipidemia.  She continues to be on aspirin and Plavix following her subclavian stent.  She also is on levothyroxine 100 g for hypothyroidism.  She presents for evaluation.  ALLERGIES: SHE DENIES ANY ALLERGIES.    Past Medical History:  Diagnosis Date  . Atherosclerosis 11/14/2005   carotid US - mild calcific non-stenotic plaque bilaterally  . Carotid bruit 02/05/2009   Doppler - R/L ICAs 0-49% diameter reductin (velocities suggest low-mid range); L subclavian 0-49%  diameter reduction  . Chronic bronchitis ([place])    "growing up; not anymore" (03/14/2013)  . Colon polyp    hyperplastic  . Costochondritis   . Dizziness   . Emphysema of lung ([place])   . GERD (gastroesophageal reflux disease)   . H. pylori infection   . Heart burn   . Hemorrhoids   . Hypothyroid   . Lipidemia   . Pneumonia ~ 2009  . Subclavian artery stenosis, left ([facility])    subclavian steal physiology status post PTA and stenting  . Syncope, near     Past Surgical History:  Procedure Laterality Date  . ABDOMINAL HYSTERECTOMY  1999  . BILATERAL UPPER EXTREMITY ANGIOGRAM N/A 03/14/2013   Procedure: BILATERAL UPPER EXTREMITY ANGIOGRAM;  Surgeon: Lorretta Harp, MD;  Location: [facility] CATH LAB;  Service: Cardiovascular;  Laterality: N/A;  . BREAST BIOPSY Right 1990's   "benign" (03/14/2013)  . CARDIAC CATHETERIZATION  03/03/2013  . DILATION AND CURETTAGE OF UTERUS  1980's  . LEFT HEART CATHETERIZATION WITH CORONARY ANGIOGRAM Bilateral 03/03/2013   Procedure: LEFT HEART CATHETERIZATION WITH CORONARY ANGIOGRAM;  Surgeon: Troy Sine, MD;  Location: [facility] CATH  LAB;  Service: Cardiovascular;  Laterality: Bilateral;  . PERCUTANEOUS STENT INTERVENTION  03/14/2013   Procedure: PERCUTANEOUS STENT INTERVENTION;  Surgeon: Lorretta Harp, MD;  Location: [facility] CATH LAB;  Service: Cardiovascular;;  . SUBCLAVIAN ARTERY STENT Left 03/14/2013    for subclavian steal/claudication (03/14/2013)  . TUBAL LIGATION  1980's    No Known Allergies  Current Outpatient Medications  Medication Sig Dispense Refill  . albuterol (PROVENTIL HFA;VENTOLIN HFA) 108 (90 Base) MCG/ACT inhaler Inhale 1-2 puffs into the lungs every 4 (four) hours as needed for wheezing or shortness of breath. 1 Inhaler 2  . aspirin 81 MG tablet Take 81 mg by mouth daily.    Marland Kitchen atorvastatin (LIPITOR) 40 MG tablet TAKE 1 TABLET BY MOUTH EVERY DAY 90 tablet 0  . clonazePAM (KLONOPIN) 0.5 MG tablet Take 1 tablet (0.5 mg total) by mouth 2 (two) times daily as needed for anxiety. 20 tablet 1  . clopidogrel (PLAVIX) 75 MG tablet Take 1 tablet (75 mg total) by mouth daily. 90 tablet 0  . FLUoxetine (PROZAC) 20 MG tablet Take 1 tablet (20 mg total) by mouth daily. 90 tablet 1  . levothyroxine (SYNTHROID) 100 MCG tablet TAKE 1 TABLET BY MOUTH DAILY BEFORE  BREAKFAST 90 tablet 1  . metoprolol tartrate (LOPRESSOR) 25 MG tablet Take 0.5 tablets (12.5 mg total) by mouth 2 (two) times daily. 60 tablet 6   No current facility-administered medications for this visit.     SOCIAL HISTORY: She is married, has three children. She works for [employer], who is also my patient, who recommended my name for the patient to see. The patient does not routinely exercise. There is no alcohol use. She had smoked for 44 years, and quit smoking in May 2014.  ROS General: Negative; No fevers, chills, or night sweats;  HEENT: Negative; No changes in vision or hearing, sinus congestion, difficulty swallowing Pulmonary: Negative; No cough, wheezing, shortness of breath, hemoptysis Cardiovascular: Negative; No chest pain,  presyncope, syncope, palpitations GI:  Positive for transient right upper quadrant discomfort GU: Negative; No dysuria, hematuria, or difficulty voiding Musculoskeletal: Negative; no myalgias, joint pain, or weakness Hematologic/Oncology: Negative; no easy bruising, bleeding Endocrine: Negative; no heat/cold intolerance; no diabetes Neuro: Negative; no changes in balance, headaches Skin: Negative; No rashes or skin lesions Psychiatric: Negative; No behavioral problems, depression Sleep: Negative; No snoring, daytime sleepiness, hypersomnolence, bruxism, restless legs, hypnogognic hallucinations, no cataplexy Other comprehensive 14 point system review is negative.   PE BP 114/68   Pulse 79   Ht 5' 5"  (1.651 m)   Wt 156 lb (70.8 kg)   BMI 25.96 kg/m    Repeat blood pressure by me was 110/70 in the right arm, 108/70 left arm, and with standing was 102/70 in the left arm.  Wt Readings from Last 3 Encounters:  09/11/17 156 lb (70.8 kg)  07/03/17 160 lb (72.6 kg)  04/06/17 172 lb (78 kg)   General: Alert, oriented, no distress.  Skin: normal turgor, no rashes, warm and dry HEENT: Normocephalic, atraumatic. Pupils equal round and reactive to light; sclera anicteric; extraocular muscles intact;  Nose without nasal septal hypertrophy Mouth/Parynx benign; Mallinpatti scale 2 Neck: No JVD, soft left carotid bruit; normal carotid upstroke Lungs: clear to ausculatation and percussion; no wheezing or rales Chest wall: without tenderness to palpitation Heart: PMI not displaced, RRR, s1 s2 normal, 1/6 systolic murmur, no diastolic murmur, no rubs, gallops, thrills, or heaves Abdomen: soft, nontender; no hepatosplenomehaly, BS+; abdominal aorta nontender and not dilated by palpation. Back: no CVA tenderness Pulses 2+ Musculoskeletal: full range of motion, normal strength, no joint deformities Extremities: no clubbing cyanosis or edema, Homan's sign negative  Neurologic: grossly nonfocal;  Cranial nerves grossly wnl Psychologic: Normal mood and affect   ECG (independently read by me): Normal sinus rhythm at 79 bpm.  Inferior and anterolateral ST-T changes which are more pronounced when compared to prior ECGs  September 2016 ECG (independently read by me):  Normal sinus rhythm at 64 bpm.  Nonspecific T changes anteriorly  May 2014 ECG: NSR at 63, anterior T wave abnormality  LABS: BMP Latest Ref Rng & Units 04/06/2017 12/01/2015 10/01/2015  Glucose 65 - 99 mg/dL 74 90 84  BUN 8 - 27 mg/dL 12 7 9   Creatinine 0.57 - 1.00 mg/dL 0.66 0.68 0.76  BUN/Creat Ratio 12 - 28 18 - -  Sodium 134 - 144 mmol/L 138 138 140  Potassium 3.5 - 5.2 mmol/L 5.1 3.8 4.4  Chloride 96 - 106 mmol/L 99 101 101  CO2 20 - 29 mmol/L 23 26 32  Calcium 8.7 - 10.3 mg/dL 10.3 9.9 10.2   Hepatic Function Latest Ref Rng & Units 04/06/2017 12/01/2015 10/01/2015  Total Protein 6.0 - 8.5  g/dL 7.8 7.6 8.2  Albumin 3.6 - 4.8 g/dL 4.9(H) 4.6 4.8  AST 0 - 40 IU/L 24 24 18   ALT 0 - 32 IU/L 36(H) 44(H) 31  Alk Phosphatase 39 - 117 IU/L 91 62 75  Total Bilirubin 0.0 - 1.2 mg/dL 0.3 0.3 0.3  Bilirubin, Direct 0.0 - 0.3 mg/dL - - 0.1   CBC Latest Ref Rng & Units 10/01/2015 02/06/2014 09/07/2013  WBC 4.0 - 10.5 K/uL 5.2 4.3(A) 7.0  Hemoglobin 12.0 - 15.0 g/dL 14.1 14.3 14.3  Hematocrit 36.0 - 46.0 % 42.6 44.8 41.9  Platelets 150.0 - 400.0 K/uL 305.0 - 257   Lab Results  Component Value Date   MCV 98.0 10/01/2015   MCV 98.7 (A) 02/06/2014   MCV 96.8 09/07/2013   Lab Results  Component Value Date   TSH 1.300 07/04/2017   Lab Results  Component Value Date   HGBA1C 5.4 06/24/2013   Lipid Panel     Component Value Date/Time   CHOL 128 04/06/2017 1415   TRIG 94 04/06/2017 1415   HDL 53 04/06/2017 1415   CHOLHDL 2.4 04/06/2017 1415   CHOLHDL 2.5 12/01/2015 1712   VLDL 27 12/01/2015 1712   LDLCALC 56 04/06/2017 1415     RADIOLOGY: No results Clarke.  IMPRESSION:  1. Chest pain, unspecified type     2. Abnormal ECG   3. Hypothyroidism, unspecified type   4. Hyperlipidemia with target LDL less than 70   5. Medication management   6. Subclavian artery stenosis, left ([place]), s/p stent 02/2013   7. Stenosis of left carotid artery     ASSESSMENT AND PLAN: Bethany Clarke is a 61 year old female who has documented normal coronary arteries with the exception of ostial calcification of her right coronary artery by catheterization in May 2014.  She is status post stenting of her left subclavian artery in May 2014. Marland Kitchen  She continues to do well from a peripheral vascular standpoint.  On exam, she has a soft left carotid bruit and was Clarke to have 40-59% left internal carotid stenoses on her most recent carotid duplex study in January 2018.  General widely patent left subclavian artery stent without evidence for restenosis.  On Thanksgiving evening she experienced an episode of upper mid epigastric to low sternal discomfort which lasted 5 minutes and seem to radiate to her jaw.  She had another episode 2 days later.  She denies clear-cut exertional precipitation of this discomfort.  She has been able to walk 2 miles a day.  Her ECG today shows sinus rhythm, but she has more pronounced inferolateral T wave abnormalities.  I have recommended she undergo an echo Doppler study to evaluate both systolic and diastolic function.  I'm also scheduling her for an exercise Myoview study to make certain her symptoms or not ischemia mediated.  I will also initiate low-dose metoprolol at 12.5 mg twice a day.  Fasting laboratory will be obtained.  She continues to be on aspirin and Plavix following her subclavian stent.  She has a history of hypothyroidism on Synthroid 100 g.  That she continues to take Prozac 20 mg daily for anxiety/depression.  I will see her in follow-up of the above studies and further recommendations will made at that time.  Time spent: 25 minutes  Troy Sine, MD, [facility]  09/11/2017 6:59 PM

## 2017-09-11 NOTE — Patient Instructions (Signed)
Medication Instructions:  START- Metoprolol 12.5 mg twice a day  If you need a refill on your cardiac medications before your next appointment, please call your pharmacy.  Labwork: CBC, BMP, TSH, and fast lipids HERE IN OUR OFFICE AT [facility]  Take the provided lab slips for you to take with you to the lab for you blood draw.   You will need to fast. DO NOT EAT OR DRINK PAST MIDNIGHT.    You may go to any [facility] lab that is convenient for you however, we do have a lab in our office that is able to assist you. You do NOT need an appointment for our lab. Once in our office lobby there is a podium to the right of the check-in desk where you are to sign-in and ring a doorbell to alert us you are here. Lab is open Monday-Friday from 8:00am to 4:00pm; and is closed for lunch from 12:45p-1:45pm   Testing/Procedures: Your physician has requested that you have an echocardiogram. Echocardiography is a painless test that uses sound waves to create images of your heart. It provides your doctor with information about the size and shape of your heart and how well your heart's chambers and valves are working. This procedure takes approximately one hour. There are no restrictions for this procedure.  Your physician has requested that you have an exercise stress myoview. For further information please visit [email or web address]. Please follow instruction sheet, as given.   Follow-Up: Your physician wants you to follow-up in: 3-4 weeks.    Thank you [facility] choosing [facility]!!

## 2017-09-19 ENCOUNTER — Other Ambulatory Visit: Payer: Self-pay

## 2017-09-19 ENCOUNTER — Ambulatory Visit (HOSPITAL_COMMUNITY): Payer: BLUE CROSS/BLUE SHIELD | Attending: Cardiovascular Disease

## 2017-09-19 DIAGNOSIS — I5189 Other ill-defined heart diseases: Secondary | ICD-10-CM | POA: Diagnosis not present

## 2017-09-19 DIAGNOSIS — J449 Chronic obstructive pulmonary disease, unspecified: Secondary | ICD-10-CM | POA: Insufficient documentation

## 2017-09-19 DIAGNOSIS — Z720 Tobacco use: Secondary | ICD-10-CM | POA: Diagnosis not present

## 2017-09-19 DIAGNOSIS — R079 Chest pain, unspecified: Secondary | ICD-10-CM | POA: Insufficient documentation

## 2017-09-19 DIAGNOSIS — R9431 Abnormal electrocardiogram [ECG] [EKG]: Secondary | ICD-10-CM

## 2017-09-19 DIAGNOSIS — E785 Hyperlipidemia, unspecified: Secondary | ICD-10-CM | POA: Insufficient documentation

## 2017-09-20 ENCOUNTER — Telehealth (HOSPITAL_COMMUNITY): Payer: Self-pay

## 2017-09-20 NOTE — Telephone Encounter (Signed)
No detailed instruction sleft on voicemail as DPR is not filled out correctly.

## 2017-09-21 ENCOUNTER — Telehealth (HOSPITAL_COMMUNITY): Payer: Self-pay

## 2017-09-21 NOTE — Telephone Encounter (Signed)
Encounter complete. 

## 2017-09-25 ENCOUNTER — Inpatient Hospital Stay (HOSPITAL_COMMUNITY)
Admission: RE | Admit: 2017-09-25 | Payer: BLUE CROSS/BLUE SHIELD | Source: Ambulatory Visit | Attending: Cardiovascular Disease | Admitting: Cardiovascular Disease

## 2017-09-26 ENCOUNTER — Telehealth (HOSPITAL_COMMUNITY): Payer: Self-pay | Admitting: Cardiovascular Disease

## 2017-09-26 ENCOUNTER — Telehealth (HOSPITAL_COMMUNITY): Payer: Self-pay

## 2017-09-26 NOTE — Telephone Encounter (Signed)
Close encounter 

## 2017-09-27 ENCOUNTER — Ambulatory Visit (HOSPITAL_COMMUNITY)
Admission: RE | Admit: 2017-09-27 | Discharge: 2017-09-27 | Disposition: A | Discharge status: Home / Self Care | Payer: BLUE CROSS/BLUE SHIELD | Source: Ambulatory Visit | Attending: Internal Medicine | Admitting: Internal Medicine

## 2017-09-27 DIAGNOSIS — R079 Chest pain, unspecified: Secondary | ICD-10-CM

## 2017-09-27 DIAGNOSIS — R9431 Abnormal electrocardiogram [ECG] [EKG]: Secondary | ICD-10-CM

## 2017-09-27 DIAGNOSIS — E039 Hypothyroidism, unspecified: Secondary | ICD-10-CM | POA: Diagnosis not present

## 2017-09-27 DIAGNOSIS — Z79899 Other long term (current) drug therapy: Secondary | ICD-10-CM | POA: Diagnosis not present

## 2017-09-27 DIAGNOSIS — E785 Hyperlipidemia, unspecified: Secondary | ICD-10-CM | POA: Diagnosis not present

## 2017-09-27 MED ORDER — TECHNETIUM TC 99M TETROFOSMIN IV KIT
10.2000 | PACK | Freq: Once | INTRAVENOUS | Status: AC | PRN
  Administered 2017-09-27: 10.2 via INTRAVENOUS
  Filled 2017-09-27: qty 11

## 2017-09-27 MED ORDER — TECHNETIUM TC 99M TETROFOSMIN IV KIT
31.8000 | PACK | Freq: Once | INTRAVENOUS | Status: AC | PRN
  Administered 2017-09-27: 31.8 via INTRAVENOUS
  Filled 2017-09-27: qty 32

## 2017-09-28 LAB — CBC
Hematocrit: 39.8 % (ref 34.0–46.6)
Hemoglobin: 13.3 g/dL (ref 11.1–15.9)
MCH: 33.6 pg — ABNORMAL HIGH (ref 26.6–33.0)
MCHC: 33.4 g/dL (ref 31.5–35.7)
MCV: 101 fL — ABNORMAL HIGH (ref 79–97)
PLATELETS: 263 10*3/uL (ref 150–379)
RBC: 3.96 x10E6/uL (ref 3.77–5.28)
RDW: 13 % (ref 12.3–15.4)
WBC: 3.8 10*3/uL (ref 3.4–10.8)

## 2017-09-28 LAB — MYOCARDIAL PERFUSION IMAGING
CHL CUP MPHR: 159 {beats}/min
CHL CUP NUCLEAR SSS: 4
CHL CUP RESTING HR STRESS: 54 {beats}/min
CSEPEDS: 1 s
CSEPEW: 10.9 METS
CSEPHR: 88 %
CSEPPHR: 141 {beats}/min
Exercise duration (min): 10 min
LV dias vol: 92 mL (ref 46–106)
LV sys vol: 42 mL
RPE: 18
SDS: 0
SRS: 4
TID: 1.06

## 2017-09-28 LAB — BASIC METABOLIC PANEL
BUN/Creatinine Ratio: 13 (ref 12–28)
BUN: 10 mg/dL (ref 8–27)
CALCIUM: 9.9 mg/dL (ref 8.7–10.3)
CHLORIDE: 98 mmol/L (ref 96–106)
CO2: 28 mmol/L (ref 20–29)
Creatinine, Ser: 0.77 mg/dL (ref 0.57–1.00)
GFR calc Af Amer: 96 mL/min/{1.73_m2} (ref >59)
GFR calc non Af Amer: 84 mL/min/{1.73_m2} (ref >59)
GLUCOSE: 80 mg/dL (ref 65–99)
POTASSIUM: 4.6 mmol/L (ref 3.5–5.2)
Sodium: 139 mmol/L (ref 134–144)

## 2017-09-28 LAB — LIPID PANEL
CHOL/HDL RATIO: 1.8 ratio (ref 0.0–4.4)
CHOLESTEROL TOTAL: 123 mg/dL (ref 100–199)
HDL: 67 mg/dL (ref >39)
LDL Calculated: 46 mg/dL (ref 0–99)
TRIGLYCERIDES: 49 mg/dL (ref 0–149)
VLDL Cholesterol Cal: 10 mg/dL (ref 5–40)

## 2017-09-28 LAB — TSH: TSH: 1.5 u[IU]/mL (ref 0.450–4.500)

## 2017-09-30 ENCOUNTER — Other Ambulatory Visit: Payer: Self-pay | Admitting: Family Medicine

## 2017-09-30 DIAGNOSIS — E785 Hyperlipidemia, unspecified: Secondary | ICD-10-CM

## 2017-09-30 DIAGNOSIS — G458 Other transient cerebral ischemic attacks and related syndromes: Secondary | ICD-10-CM

## 2017-10-01 ENCOUNTER — Ambulatory Visit (INDEPENDENT_AMBULATORY_CARE_PROVIDER_SITE_OTHER): Payer: BLUE CROSS/BLUE SHIELD | Admitting: Cardiovascular Disease

## 2017-10-01 ENCOUNTER — Encounter: Payer: Self-pay | Admitting: Cardiovascular Disease

## 2017-10-01 VITALS — BP 118/66 | HR 53 | Ht 65.0 in | Wt 159.0 lb

## 2017-10-01 DIAGNOSIS — I6523 Occlusion and stenosis of bilateral carotid arteries: Secondary | ICD-10-CM | POA: Diagnosis not present

## 2017-10-01 DIAGNOSIS — R9431 Abnormal electrocardiogram [ECG] [EKG]: Secondary | ICD-10-CM

## 2017-10-01 DIAGNOSIS — I771 Stricture of artery: Secondary | ICD-10-CM

## 2017-10-01 DIAGNOSIS — E785 Hyperlipidemia, unspecified: Secondary | ICD-10-CM | POA: Diagnosis not present

## 2017-10-01 DIAGNOSIS — E039 Hypothyroidism, unspecified: Secondary | ICD-10-CM | POA: Diagnosis not present

## 2017-10-01 DIAGNOSIS — R079 Chest pain, unspecified: Secondary | ICD-10-CM

## 2017-10-01 NOTE — Telephone Encounter (Signed)
Request for atorvastatin, Last lipid panel 04/06/17; and refill for plavix

## 2017-10-01 NOTE — Progress Notes (Signed)
Patient ID: Bethany Clarke, female   DOB: 30-Apr-1956, 61 y.o.   MRN: [id number]    HPI: Bethany Clarke is a 61 y.o. female who presents for a 3 week follow-up cardiology evaluation and follow-up of her recent myocardial perfusion study.   Bethany Clarke  has a history of hypothyroidism and hyperlipidemia.  In September 2013, she  had an endoscopy for epigastric discomfort and was treated with Nexium. She also was told to have sludge in her gallbladder and some inflammation of her bile duct, for which she has seen Dr. Henrene Pastor. The patient has a very strong family history for coronary artery disease with a sister, who underwent CABG surgery at age 47, as well as a father, who died at a young age with cancer but also had heart disease, and also many family members with stroke and heart disease. The patient had a 44 year history of tobacco use, having started at age 84. She has noticed recurrent episodes of epigastric and somewhat right-sided chest discomfort.  When I saw her back then  He complained of a progressive decline in activity with development of exertional shortness of breath  And also had awakened from sleep with chest pain.  She ultimately underwentcardiac catheterization was on Mar 03, 2013  Which demonstratednormal LV function without definite obstructive disease. Her left artery system was entirely normal. She did have mild ostial calcification of her right coronary artery without stenosis.   When I saw her initially on Feb 19, 2013 she had a 30 mm blood pressure differential between her right and left arm. Subsequent carotid duplex imaging suggested proximal left subclavian occlusion. She did have 0-49% stenosis in the right internal carotid artery and had progressive stenosis in her left mid internal carotid now showing a 50-69% diameter reduction. She had retrograde flow in her left vertebral artery suggestive of subclavian steal. In retrospect, she does admit to episodes of dizziness. Upper  extremity duplex imaging revealed a 38 mm reduction in left brachial artery pressure. A 2-D echo Doppler study showed ejection fraction of 55-60% with normal diastolic parameters and mildly thickened mitral valve leaflets appear.   I referred Bethany Clarke to Dr. Gwenlyn Found and she ultimately underwent aortic arch angiography and bilateral subclavian angiography which revealed a 99% proximal left subclavian artery stenosis with retrograde filling of the left vertebral system. Her subclavian artery was successfully stented.  She had marked improvement in her subclavian steal symptoms and her left upper extremity Doppler normalized.  She had stopped smoking.  She had gained weight.  Follow-up Doppler studies in December 2015 were not significantly changed since the year previously in the right brachial pressure was 143, and left brachial pressure 138 arguing against upper extremity inflow disease. Carotid studies in December 2015 showed mild plaque without high-grade stenosis.  Her left vertebral artery was not visualized.  Since I last saw her in September 2016, she had remained fairly stable.  She underwent a carotid Doppler study in January 2018 which showed heterogeneous plaque bilaterally.  There was stable 1-39% right internal carotid stenoses and stable 40-59% left internal carotid.  She had a patent left subclavian artery stent without evidence for restenosis.  She had a normal right subclavian artery, and patent vertebral arteries with antegrade flow.  She quit smoking 4 years ago.  She had gained weight after quitting smoking but in May started a ketone diet and has lost 40 pounds since May.  She has been active and walks on a treadmill and  denies any exertionally precipitated chest tightness or significant dyspnea.  Scheduling evening, she had experienced an episode of lower sternal mid epigastric discomfort which lasted approximately 5 minutes, but was associated with intense pain.  She had another episode  of chest pain 2 days later which also was nonexertional.  She had not experienced any chest tightness on walking 2 miles on a treadmill.    She has had some issues of right upper quadrant discomfort.  In the past.  She was noted to have sludge in her gallbladder. She is unaware of palpitations.  She has been taking atorvastatin 40 mg for hyperlipidemia.  She continues to be on aspirin and Plavix following her subclavian stent.  She also is on levothyroxine 100 g for hypothyroidism.   When I last saw her, I recommended that she undergo a nuclear stress test and 2-D echo Doppler study for further evaluation of her symptoms and ECG changes.  I had empirically started her on metoprolol 12.5 mg twice a day.  She has felt well on this therapy.  She denies any recurrent episodes of chest tightness.  If she has experienced an episode of chest pain.  It has been nonexertional and lasts 15-30 seconds and is very fleeting and resolve spontaneously.  She denies any exertional dyspnea.  The echo Doppler study on 09/19/2017 showed an EF of 55-60%.  There was grade 2 diastolic dysfunction.  There was trivial AR and MR.  She had normal pulmonary pressures.  A nuclear stress test was low risk and showed an EF of 54%.  There was a small apical inferior defect without associated ischemia.  She had her baseline abnormal ECG changes.  She had a normal TID calculation.  She presents for follow-up evaluation.  ALLERGIES: SHE DENIES ANY ALLERGIES.    Past Medical History:  Diagnosis Date  . Atherosclerosis 11/14/2005   carotid US - mild calcific non-stenotic plaque bilaterally  . Carotid bruit 02/05/2009   Doppler - R/L ICAs 0-49% diameter reductin (velocities suggest low-mid range); L subclavian 0-49%  diameter reduction  . Chronic bronchitis ([place])    "growing up; not anymore" (03/14/2013)  . Colon polyp    hyperplastic  . Costochondritis   . Dizziness   . Emphysema of lung ([place])   . GERD (gastroesophageal reflux  disease)   . H. pylori infection   . Heart burn   . Hemorrhoids   . Hypothyroid   . Lipidemia   . Pneumonia ~ 2009  . Subclavian artery stenosis, left ([facility])    subclavian steal physiology status post PTA and stenting  . Syncope, near     Past Surgical History:  Procedure Laterality Date  . ABDOMINAL HYSTERECTOMY  1999  . BILATERAL UPPER EXTREMITY ANGIOGRAM N/A 03/14/2013   Procedure: BILATERAL UPPER EXTREMITY ANGIOGRAM;  Surgeon: Lorretta Harp, MD;  Location: [facility] CATH LAB;  Service: Cardiovascular;  Laterality: N/A;  . BREAST BIOPSY Right 1990's   "benign" (03/14/2013)  . CARDIAC CATHETERIZATION  03/03/2013  . DILATION AND CURETTAGE OF UTERUS  1980's  . LEFT HEART CATHETERIZATION WITH CORONARY ANGIOGRAM Bilateral 03/03/2013   Procedure: LEFT HEART CATHETERIZATION WITH CORONARY ANGIOGRAM;  Surgeon: Troy Sine, MD;  Location: [facility] CATH LAB;  Service: Cardiovascular;  Laterality: Bilateral;  . PERCUTANEOUS STENT INTERVENTION  03/14/2013   Procedure: PERCUTANEOUS STENT INTERVENTION;  Surgeon: Lorretta Harp, MD;  Location: [facility] CATH LAB;  Service: Cardiovascular;;  . SUBCLAVIAN ARTERY STENT Left 03/14/2013    for subclavian steal/claudication (03/14/2013)  .  TUBAL LIGATION  1980's    No Known Allergies  Current Outpatient Medications  Medication Sig Dispense Refill  . albuterol (PROVENTIL HFA;VENTOLIN HFA) 108 (90 Base) MCG/ACT inhaler Inhale 1-2 puffs into the lungs every 4 (four) hours as needed for wheezing or shortness of breath. 1 Inhaler 2  . aspirin 81 MG tablet Take 81 mg by mouth daily.    . clonazePAM (KLONOPIN) 0.5 MG tablet Take 1 tablet (0.5 mg total) by mouth 2 (two) times daily as needed for anxiety. 20 tablet 1  . FLUoxetine (PROZAC) 20 MG tablet Take 1 tablet (20 mg total) by mouth daily. 90 tablet 1  . levothyroxine (SYNTHROID) 100 MCG tablet TAKE 1 TABLET BY MOUTH DAILY BEFORE BREAKFAST 90 tablet 1  . metoprolol tartrate (LOPRESSOR) 25 MG tablet Take 0.5 tablets  (12.5 mg total) by mouth 2 (two) times daily. 60 tablet 6  . atorvastatin (LIPITOR) 40 MG tablet TAKE 1 TABLET BY MOUTH EVERY DAY 90 tablet 0  . clopidogrel (PLAVIX) 75 MG tablet TAKE 1 TABLET BY MOUTH EVERY DAY 90 tablet 0   No current facility-administered medications for this visit.     SOCIAL HISTORY: She is married, has three children. She works for [employer], who is also my patient, who recommended my name for the patient to see. The patient does not routinely exercise. There is no alcohol use. She had smoked for 44 years, and quit smoking in May 2014.  ROS General: Negative; No fevers, chills, or night sweats;  HEENT: Negative; No changes in vision or hearing, sinus congestion, difficulty swallowing Pulmonary: Negative; No cough, wheezing, shortness of breath, hemoptysis Cardiovascular: Negative; No chest pain, presyncope, syncope, palpitations GI:  Positive for transient right upper quadrant discomfort GU: Negative; No dysuria, hematuria, or difficulty voiding Musculoskeletal: Negative; no myalgias, joint pain, or weakness Hematologic/Oncology: Negative; no easy bruising, bleeding Endocrine: Negative; no heat/cold intolerance; no diabetes Neuro: Negative; no changes in balance, headaches Skin: Negative; No rashes or skin lesions Psychiatric: Negative; No behavioral problems, depression Sleep: Negative; No snoring, daytime sleepiness, hypersomnolence, bruxism, restless legs, hypnogognic hallucinations, no cataplexy Other comprehensive 14 point system review is negative.   PE BP 118/66   Pulse (!) 53   Ht 5' 5"  (1.651 m)   Wt 159 lb (72.1 kg)   BMI 26.46 kg/m    Repeat blood pressure by me supine in the right arm was 104/60 in the left arm 98/60.  Standing in the right arm 118/68, and in the left arm, 110/68.  Wt Readings from Last 3 Encounters:  10/01/17 159 lb (72.1 kg)  09/27/17 156 lb (70.8 kg)  09/11/17 156 lb (70.8 kg)   General: Alert, oriented, no  distress.  Skin: normal turgor, no rashes, warm and dry HEENT: Normocephalic, atraumatic. Pupils equal round and reactive to light; sclera anicteric; extraocular muscles intact;  Nose without nasal septal hypertrophy  Mouth/Parynx benign; Mallinpatti scale 3 Neck: No JVD, soft left carotid bruit; normal carotid upstroke Lungs: clear to ausculatation and percussion; no wheezing or rales Chest wall: without tenderness to palpitation Heart: PMI not displaced, RRR, s1 s2 normal, 1/6 systolic murmur, no diastolic murmur, no rubs, gallops, thrills, or heaves Abdomen: soft, nontender; no hepatosplenomehaly, BS+; abdominal aorta nontender and not dilated by palpation. Back: no CVA tenderness Pulses 2+ Musculoskeletal: full range of motion, normal strength, no joint deformities Extremities: no clubbing cyanosis or edema, Homan's sign negative  Neurologic: grossly nonfocal; Cranial nerves grossly wnl Psychologic: Normal mood and affect  ECG (independently read by me): Sinus bradycardia 53 bpm.  Nonspecific T-wave abnormality anteriorly.  November 2017 ECG (independently read by me): Normal sinus rhythm at 79 bpm.  Inferior and anterolateral ST-T changes which are more pronounced when compared to prior ECGs  September 2016 ECG (independently read by me):  Normal sinus rhythm at 64 bpm.  Nonspecific T changes anteriorly  May 2014 ECG: NSR at 63, anterior T wave abnormality  LABS: BMP Latest Ref Rng & Units 09/27/2017 04/06/2017 12/01/2015  Glucose 65 - 99 mg/dL 80 74 90  BUN 8 - 27 mg/dL 10 12 7   Creatinine 0.57 - 1.00 mg/dL 0.77 0.66 0.68  BUN/Creat Ratio 12 - 28 13 18  -  Sodium 134 - 144 mmol/L 139 138 138  Potassium 3.5 - 5.2 mmol/L 4.6 5.1 3.8  Chloride 96 - 106 mmol/L 98 99 101  CO2 20 - 29 mmol/L 28 23 26   Calcium 8.7 - 10.3 mg/dL 9.9 10.3 9.9   Hepatic Function Latest Ref Rng & Units 04/06/2017 12/01/2015 10/01/2015  Total Protein 6.0 - 8.5 g/dL 7.8 7.6 8.2  Albumin 3.6 - 4.8 g/dL  4.9(H) 4.6 4.8  AST 0 - 40 IU/L 24 24 18   ALT 0 - 32 IU/L 36(H) 44(H) 31  Alk Phosphatase 39 - 117 IU/L 91 62 75  Total Bilirubin 0.0 - 1.2 mg/dL 0.3 0.3 0.3  Bilirubin, Direct 0.0 - 0.3 mg/dL - - 0.1   CBC Latest Ref Rng & Units 09/27/2017 10/01/2015 02/06/2014  WBC 3.4 - 10.8 x10E3/uL 3.8 5.2 4.3(A)  Hemoglobin 11.1 - 15.9 g/dL 13.3 14.1 14.3  Hematocrit 34.0 - 46.6 % 39.8 42.6 44.8  Platelets 150 - 379 x10E3/uL 263 305.0 -   Lab Results  Component Value Date   MCV 101 (H) 09/27/2017   MCV 98.0 10/01/2015   MCV 98.7 (A) 02/06/2014   Lab Results  Component Value Date   TSH 1.500 09/27/2017   Lab Results  Component Value Date   HGBA1C 5.4 06/24/2013   Lipid Panel     Component Value Date/Time   CHOL 123 09/27/2017 1528   TRIG 49 09/27/2017 1528   HDL 67 09/27/2017 1528   CHOLHDL 1.8 09/27/2017 1528   CHOLHDL 2.5 12/01/2015 1712   VLDL 27 12/01/2015 1712   LDLCALC 46 09/27/2017 1528     RADIOLOGY: No results found.  IMPRESSION:  1. Chest pain, unspecified type   2. Abnormal ECG   3. Subclavian artery stenosis, left ([place]), s/p stent 02/2013   4. Hypothyroidism, unspecified type   5. Hyperlipidemia with target LDL less than 70   6. Bilateral carotid artery stenosis     ASSESSMENT AND PLAN: Bethany Clarke is a 61 year old female who has documented normal coronary arteries with the exception of ostial calcification of her right coronary artery by catheterization in May 2014.  She is status post stenting of her left subclavian artery in May 2014.   On exam, she has a soft left carotid bruit and was found to have 40-59% left internal carotid stenoses on her most recent carotid duplex study in January 2018. She had a widely patent left subclavian artery stent without evidence for restenosis.  On Thanksgiving evening she experienced an episode of upper mid epigastric to low sternal discomfort which lasted 5 minutes and seem to radiate to her jaw.  She had another episode 2  days later.  She denies clear-cut exertional precipitation of this discomfort.  I had appear clear started on metoprolol 12.5 mg  twice a day.  She has felt well on this therapy.  I reviewed her echo Doppler data which shows normal systolic function with grade 2 diastolic dysfunction.  Her nuclear perfusion study demonstrates fairly normal perfusion with the exception of a small apical inferior defect.  There is no associated ischemia.  I suspect this may be related to possible breast diaphragmatic attenuation.  She did not have any transient ischemic dilation.  She has not had any exertional symptoms.  Of note, her ECG today shows significant improvement in her previous ST-T abnormalities.  Early symptoms of chest pain, or a very sharp fleeting episodes which last 15-30 seconds in duration and is not exertionally precipitated.  At this present time, I do not feel further evaluation is indicated unless there is change in symptomatology.  Remotely she had been found to have normal coronary arteries at catheterization.  If she does note a change in symptomatology, further evaluation with possible CT angiography may be considered.  She continues to take aspirin and Plavix.  She is on levothyroxine for hypothyroidism.  I recommended she continue her low-dose metoprolol 12.5 mg twice a day.  Her pulse today is stable.  Recent laboratory showed excellent lipid studies with total cholesterol 123, triglycerides 49, HDL 67, and LDL 46.  I will see her in 6 months for reevaluation or sooner from's arise.  Time spent: 25 minutes  Troy Sine, MD, [facility]  10/01/2017 1:19 PM

## 2017-10-01 NOTE — Patient Instructions (Signed)

## 2017-10-03 NOTE — Telephone Encounter (Signed)
User: Trina AoGRIFFIN, Erionna Strum A Date/time: 09/26/17 10:36 AM  Comment: Called pt and lmsg for her to CB to get r/s for appt missed due to office closing.   Context:  Outcome: Left Message  Phone number: [phone] Phone Type: Mobile  Comm. type: Telephone Call type: Outgoing  Contact: Gilman ButtnerStephens, Anaih Y Relation to patient: Self

## 2017-10-07 ENCOUNTER — Other Ambulatory Visit: Payer: Self-pay | Admitting: Family Medicine

## 2017-10-07 DIAGNOSIS — F418 Other specified anxiety disorders: Secondary | ICD-10-CM

## 2017-10-08 NOTE — Telephone Encounter (Signed)
Klonopin discussed at last visit, refilled with 1 additional refill. Sent electronically

## 2017-11-06 ENCOUNTER — Ambulatory Visit (HOSPITAL_COMMUNITY)
Admission: RE | Admit: 2017-11-06 | Discharge: 2017-11-06 | Disposition: A | Discharge status: Home / Self Care | Payer: BLUE CROSS/BLUE SHIELD | Source: Ambulatory Visit | Attending: Cardiology | Admitting: Cardiology

## 2017-11-06 DIAGNOSIS — E785 Hyperlipidemia, unspecified: Secondary | ICD-10-CM | POA: Diagnosis not present

## 2017-11-06 DIAGNOSIS — I6523 Occlusion and stenosis of bilateral carotid arteries: Secondary | ICD-10-CM | POA: Diagnosis not present

## 2017-11-06 DIAGNOSIS — I6529 Occlusion and stenosis of unspecified carotid artery: Secondary | ICD-10-CM | POA: Diagnosis not present

## 2017-11-12 ENCOUNTER — Other Ambulatory Visit: Payer: Self-pay | Admitting: Cardiovascular Disease

## 2017-11-12 DIAGNOSIS — I6523 Occlusion and stenosis of bilateral carotid arteries: Secondary | ICD-10-CM

## 2017-11-12 DIAGNOSIS — I771 Stricture of artery: Secondary | ICD-10-CM

## 2017-11-16 DIAGNOSIS — H25013 Cortical age-related cataract, bilateral: Secondary | ICD-10-CM | POA: Diagnosis not present

## 2017-11-16 DIAGNOSIS — H524 Presbyopia: Secondary | ICD-10-CM | POA: Diagnosis not present

## 2017-11-16 DIAGNOSIS — H5203 Hypermetropia, bilateral: Secondary | ICD-10-CM | POA: Diagnosis not present

## 2017-12-16 ENCOUNTER — Other Ambulatory Visit: Payer: Self-pay | Admitting: Family Medicine

## 2017-12-16 DIAGNOSIS — F418 Other specified anxiety disorders: Secondary | ICD-10-CM

## 2017-12-29 DIAGNOSIS — J069 Acute upper respiratory infection, unspecified: Secondary | ICD-10-CM | POA: Diagnosis not present

## 2017-12-29 DIAGNOSIS — M791 Myalgia, unspecified site: Secondary | ICD-10-CM | POA: Diagnosis not present

## 2018-01-03 ENCOUNTER — Ambulatory Visit (INDEPENDENT_AMBULATORY_CARE_PROVIDER_SITE_OTHER): Payer: BLUE CROSS/BLUE SHIELD

## 2018-01-03 ENCOUNTER — Encounter: Payer: Self-pay | Admitting: Family Medicine

## 2018-01-03 ENCOUNTER — Other Ambulatory Visit: Payer: Self-pay

## 2018-01-03 ENCOUNTER — Ambulatory Visit (INDEPENDENT_AMBULATORY_CARE_PROVIDER_SITE_OTHER): Payer: BLUE CROSS/BLUE SHIELD | Admitting: Family Medicine

## 2018-01-03 VITALS — BP 122/72 | HR 93 | Temp 98.6°F | Resp 16 | Ht 65.35 in | Wt 164.8 lb

## 2018-01-03 DIAGNOSIS — R059 Cough, unspecified: Secondary | ICD-10-CM

## 2018-01-03 DIAGNOSIS — R05 Cough: Secondary | ICD-10-CM

## 2018-01-03 DIAGNOSIS — R0600 Dyspnea, unspecified: Secondary | ICD-10-CM | POA: Diagnosis not present

## 2018-01-03 DIAGNOSIS — J449 Chronic obstructive pulmonary disease, unspecified: Secondary | ICD-10-CM | POA: Diagnosis not present

## 2018-01-03 DIAGNOSIS — R0602 Shortness of breath: Secondary | ICD-10-CM | POA: Diagnosis not present

## 2018-01-03 MED ORDER — IPRATROPIUM BROMIDE 0.02 % IN SOLN: 0.5000 mg | Freq: Once | RESPIRATORY_TRACT | Status: DC

## 2018-01-03 MED ORDER — PREDNISONE 20 MG PO TABS: 40.0000 mg | ORAL_TABLET | Freq: Every day | ORAL | 0 refills | Status: DC

## 2018-01-03 MED ORDER — HYDROCODONE-HOMATROPINE 5-1.5 MG/5ML PO SYRP: ORAL_SOLUTION | ORAL | 0 refills | Status: DC

## 2018-01-03 MED ORDER — ALBUTEROL SULFATE (2.5 MG/3ML) 0.083% IN NEBU
2.5000 mg | INHALATION_SOLUTION | Freq: Once | RESPIRATORY_TRACT | Status: DC
Start: 2018-01-03 — End: 2019-01-05

## 2018-01-03 NOTE — Progress Notes (Signed)
Subjective:  By signing my name below, I, Bethany Clarke, attest that this documentation has been prepared under the direction and in the presence of Bethany StaggersJeffrey Jill Stopka, MD. Electronically Signed: Stann Oresung-Kai Clarke, Scribe. 01/03/2018 , 8:21 AM .  Patient was seen in Room 10 .   Patient ID: Bethany Clarke, female    DOB: 07/07/1956, 62 y.o.   MRN: [id number] Chief Complaint  Patient presents with  . Nasal Congestion    with drainage and pressure   . Cough    chronic with shob x 1 week was seen at a walkin cl[facility] about a week ago but the symptoms are worst.    HPI Bethany Clarke is a 62 y.o. female  Here with cough and dyspnea. She was seen at [facility] on March 16th, for approximately 2 days of URI symptoms with cough, congestion and fever. She was treated with tessalon, and zpak. Flu testing was negative. Her temperature was 97.7F and O2 sat 95%. He does have history of COPD. She quit smoking a few years ago. Previously had been taking Symbicort, and last saw pulmonology, Dr. Sherene SiresWert, in Feb 2017.   Patient reports her symptoms have gotten worse with feeling even more short of breath, as well as nose being even more runny. 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 She describes COPD would usually cause her lungs to flare up and have more tightness. She notes having pain and tightness with coughing and some pain in her back. She informs temperature of 65F yesterday, but nothing over 100F. She mentions the more she talks, the more coughing. She's been drinking plenty of fluids. She states using her albuterol inhaler twice yesterday, first time since symptoms started, but without relief. She's been taking tessalon, and finished her zpak. She reports having plenty of sick contact at work, with flu and viruses. She's taken prednisone in the past and did fine on it.   She saw  her cardiologist in Dec 2018, and had stress test done; informed normal.   Patient Active Problem List   Diagnosis Date Noted  . Chest pain, atypical 03/17/2016  . Paresthesia 07/14/2013  . Bilateral foot pain 05/01/2013  . Subclavian artery stenosis, left (HCC)   . Subclavian steal syndrome 03/16/2013  . Subclavian arterial stenosis (HCC) 03/11/2013  . Carotid stenosis 03/11/2013  . Hyperlipidemia with target LDL less than 70 03/11/2013  . GERD (gastroesophageal reflux disease)   . Hypothyroid   . Dizziness   . Syncope, near   . Heart burn   . Hypothyroidism 05/28/2007  . COPD GOLD II with reversibility  05/28/2007   Past Medical History:  Diagnosis Date  . Atherosclerosis 11/14/2005   carotid US - mild calcific non-stenotic plaque bilaterally  . Carotid bruit 02/05/2009   Doppler - R/L ICAs 0-49% diameter reductin (velocities suggest low-mid range); L subclavian 0-49%  diameter reduction  . Chronic bronchitis (HCC)    "growing up; not anymore" (03/14/2013)  . Colon polyp    hyperplastic  . Costochondritis   . Dizziness   . Emphysema of lung (HCC)   . GERD (gastroesophageal reflux disease)   . H. pylori infection   . Heart burn   . Hemorrhoids   . Hypothyroid   . Lipidemia   . Pneumonia ~ 2009  . Subclavian artery stenosis, left (HCC)    subclavian steal  physiology status post PTA and stenting  . Syncope, near    Past Surgical History:  Procedure Laterality Date  . ABDOMINAL HYSTERECTOMY  1999  . BILATERAL UPPER EXTREMITY ANGIOGRAM N/A 03/14/2013   Procedure: BILATERAL UPPER EXTREMITY ANGIOGRAM;  Surgeon: [name], MD;  Location: [facility] CATH LAB;  Service: Cardiovascular;  Laterality: N/A;  . BREAST BIOPSY Right 1990's   "benign" (03/14/2013)  . CARDIAC CATHETERIZATION  03/03/2013  . DILATION AND CURETTAGE OF UTERUS  1980's  . LEFT HEART CATHETERIZATION WITH CORONARY ANGIOGRAM Bilateral 03/03/2013   Procedure: LEFT HEART CATHETERIZATION WITH CORONARY ANGIOGRAM;   Surgeon: Lennette Bihari, MD;  Location: [facility] CATH LAB;  Service: Cardiovascular;  Laterality: Bilateral;  . PERCUTANEOUS STENT INTERVENTION  03/14/2013   Procedure: PERCUTANEOUS STENT INTERVENTION;  Surgeon: [name], MD;  Location: [facility] CATH LAB;  Service: Cardiovascular;;  . SUBCLAVIAN ARTERY STENT Left 03/14/2013    for subclavian steal/claudication (03/14/2013)  . TUBAL LIGATION  1980's   No Known Allergies Prior to Admission medications   Medication Sig Start Date End Date Taking? Authorizing Provider  azithromycin (ZITHROMAX) 250 MG tablet Take two tablets by mouth once on Day 1, then take one tablet by mouth daily on Days 2-5. 12/29/17  Yes [provider]  benzonatate (TESSALON) 100 MG capsule Swallow whole one (100mg ) capsule by mouth 3 times a day  as needed.Do not break, chew, dissolve, cut or crush. 12/29/17 01/05/18 Yes [provider]  albuterol (PROVENTIL HFA;VENTOLIN HFA) 108 (90 Base) MCG/ACT inhaler Inhale 1-2 puffs into the lungs every 4 (four) hours as needed for wheezing or shortness of breath. 03/17/16   Nyoka Cowden, MD  aspirin 81 MG tablet Take 81 mg by mouth daily.    [provider]  atorvastatin (LIPITOR) 40 MG tablet TAKE 1 TABLET BY MOUTH EVERY DAY 10/01/17   [name], MD  clonazePAM (KLONOPIN) 0.5 MG tablet TAKE 1 TABLET BY MOUTH TWICE A DAY AS NEEDED FOR ANXIETY 10/08/17   [name], MD  clopidogrel (PLAVIX) 75 MG tablet TAKE 1 TABLET BY MOUTH EVERY DAY 10/01/17   [name], MD  FLUoxetine (PROZAC) 20 MG tablet TAKE 1 TABLET BY MOUTH EVERY DAY 12/17/17   [name], MD  levothyroxine (SYNTHROID) 100 MCG tablet TAKE 1 TABLET BY MOUTH DAILY BEFORE BREAKFAST 07/11/17   [name], MD  metoprolol tartrate (LOPRESSOR) 25 MG tablet Take 0.5 tablets (12.5 mg total) by mouth 2 (two) times daily. 09/11/17 12/10/17  Lennette Bihari, MD   Social History   Socioeconomic History  . Marital status: Married     Spouse name: Not on file  . Number of children: 3  . Years of education: Not on file  . Highest education level: Not on file  Occupational History  . Occupation: OFFICE MANGER    Employer: [employer]  Social Needs  . Financial resource strain: Not on file  . Food insecurity:    Worry: Not on file    Inability: Not on file  . Transportation needs:    Medical: Not on file    Non-medical: Not on file  Tobacco Use  . Smoking status: Former Smoker    Packs/day: 0.50    Years: 40.00    Pack years: 20.00    Types: Cigarettes    Last attempt to quit: 02/11/2013    Years since quitting: 4.8  . Smokeless tobacco: Never Used  Substance and Sexual Activity  . Alcohol use:  No  . Drug use: No  . Sexual activity: Yes    Comment: number of sex partners in the last 12 months  1  Lifestyle  . Physical activity:    Days per week: Not on file    Minutes per session: Not on file  . Stress: Not on file  Relationships  . Social connections:    Talks on phone: Not on file    Gets together: Not on file    Attends religious service: Not on file    Active member of club or organization: Not on file    Attends meetings of clubs or organizations: Not on file    Relationship status: Not on file  . Intimate partner violence:    Fear of current or ex partner: Not on file    Emotionally abused: Not on file    Physically abused: Not on file    Forced sexual activity: Not on file  Other Topics Concern  . Not on file  Social History Narrative   Daily caffeine. Married. Education: [employer]. Exercise: No.   Review of Systems  Constitutional: Negative for chills, fatigue, fever and unexpected weight change.  HENT: Positive for congestion, rhinorrhea and sinus pressure.   Respiratory: Positive for cough, chest tightness and shortness of breath.   Gastrointestinal: Negative for constipation, diarrhea, nausea and vomiting.  Skin: Negative for rash and wound.  Neurological: Negative for  dizziness, weakness and headaches.       Objective:   Physical Exam  Constitutional: She is oriented to person, place, and time. She appears well-developed and well-nourished. No distress.  HENT:  Head: Normocephalic and atraumatic.  Nose: Right sinus exhibits maxillary sinus tenderness. Left sinus exhibits maxillary sinus tenderness.  Mouth/Throat: Mucous membranes are normal.  Eyes: Pupils are equal, round, and reactive to light. EOM are normal.  Neck: Neck supple.  Cardiovascular: Normal rate.  Pulmonary/Chest: Effort normal. No respiratory distress. She has wheezes (faint) in the right middle field.  Faint wheeze right middle lobe, otherwise distant but clear  Musculoskeletal: Normal range of motion.  Neurological: She is alert and oriented to person, place, and time.  Skin: Skin is warm and dry.  Psychiatric: She has a normal mood and affect. Her behavior is normal.  Nursing note and vitals reviewed.   Vitals:   01/03/18 0813  BP: 122/72  Pulse: 93  Resp: 16  Temp: 98.6 F (37 C)  TempSrc: Oral  SpO2: 94%  Weight: 164 lb 12.8 oz (74.8 kg)  Height: 5' 5.35" (1.66 m)   Dg Chest 2 View  Result Date: 01/03/2018 CLINICAL DATA:  Cough and shortness of breath for 1 week EXAM: CHEST - 2 VIEW COMPARISON:  12/13/2015 FINDINGS: Cardiac shadow is within normal limits. The lungs are well aerated bilaterally. Minimal chronic scarring in the lingula is again noted. Aortic calcifications are seen. Left subclavian artery stent is noted and stable. IMPRESSION: Chronic changes without acute abnormality Electronically Signed   By: Alcide Clever M.D.   On: 01/03/2018 09:02        Assessment & Plan:    IVYROSE HASHMAN is a 62 y.o. female Cough - Plan: DG Chest 2 View, albuterol (PROVENTIL) (2.5 MG/3ML) 0.083% nebulizer solution 2.5 mg, ipratropium (ATROVENT) nebulizer solution 0.5 mg  Dyspnea, unspecified type - Plan: DG Chest 2 View, albuterol (PROVENTIL) (2.5 MG/3ML) 0.083%  nebulizer solution 2.5 mg, ipratropium (ATROVENT) nebulizer solution 0.5 mg  Chronic obstructive pulmonary disease, unspecified COPD type (HCC) - Plan: albuterol (  PROVENTIL) (2.5 MG/3ML) 0.083% nebulizer solution 2.5 mg, ipratropium (ATROVENT) nebulizer solution 0.5 mg  Possible initial viral illness, vs bronchitis. S/p Z pak (just finished - still treating).  Suspect some component of COPD/COPD exacerbation.  Slight improvement in aeration with albuterol/Atrovent neb  -Prednisone 40 mg daily times 5 days, albuterol if needed  -Hycodan cough syrup at night if not wheezing/shortness of breath.  RTC precautions  Meds ordered this encounter  Medications  . albuterol (PROVENTIL) (2.5 MG/3ML) 0.083% nebulizer solution 2.5 mg  . ipratropium (ATROVENT) nebulizer solution 0.5 mg  . predniSONE (DELTASONE) 20 MG tablet    Sig: Take 2 tablets (40 mg total) by mouth daily with breakfast.    Dispense:  10 tablet    Refill:  0  . HYDROcodone-homatropine (HYCODAN) 5-1.5 MG/5ML syrup    Sig: 55m by mouth a bedtime as needed for cough.    Dispense:  120 mL    Refill:  0   Patient Instructions   Use albuterol inhaler 1-2 puffs every 4-6 hours as needed for shortness of breath, wheezing, or tightness.  Prednisone should help if this is a flare of the COPD along with prior infection.   Previous antibiotic is still in your system, so I do nto see a need to start a new one at this time. Tessalon or Mucinex DM during the day (as long as the mucinex does not make you wheeze more), and saline nasal spray during the day for nasal congestion.  At bedtime, you can take a small dose of the new cough syrup as long as you are not short of breath, wheezing, or tightness as that should be treated with albuterol, not cough suppressant.   Return to the clinic or go to the nearest emergency room if any of your symptoms worsen or new symptoms occur.  Please follow up with me in the next week to recheck your cough and can  review your other meds and refills at that time.   Chronic Obstructive Pulmonary Disease Chronic obstructive pulmonary disease (COPD) is a long-term (chronic) lung problem. When you have COPD, it is hard for air to get in and out of your lungs. The way your lungs work will never return to normal. Usually the condition gets worse over time. There are things you can do to keep yourself as healthy as possible. Your doctor may treat your condition with:  Medicines.  Quitting smoking, if you smoke.  Rehabilitation. This may involve a team of specialists.  Oxygen.  Exercise and changes to your diet.  Lung surgery.  Comfort measures (palliative care).  Follow these instructions at home: Medicines  Take over-the-counter and prescription medicines only as told by your doctor.  Talk to your doctor before taking any cough or allergy medicines. You may need to avoid medicines that cause your lungs to be dry. Lifestyle  If you smoke, stop. Smoking makes the problem worse. If you need help quitting, ask your doctor.  Avoid being around things that make your breathing worse. This may include smoke, chemicals, and fumes.  Stay active, but remember to also rest.  Learn and use tips on how to relax.  Make sure you get enough sleep. Most adults need at least 7 hours a night.  Eat healthy foods. Eat smaller meals more often. Rest before meals. Controlled breathing  Learn and use tips on how to control your breathing as told by your doctor. Try: ? Breathing in (inhaling) through your nose for 1 second. Then, pucker  your lips and breath out (exhale) through your lips for 2 seconds. ? Putting one hand on your belly (abdomen). Breathe in slowly through your nose for 1 second. Your hand on your belly should move out. Pucker your lips and breathe out slowly through your lips. Your hand on your belly should move in as you breathe out. Controlled coughing  Learn and use controlled coughing to clear  mucus from your lungs. The steps are: 1. Lean your head a little forward. 2. Breathe in deeply. 3. Try to hold your breath for 3 seconds. 4. Keep your mouth slightly open while coughing 2 times. 5. Spit any mucus out into a tissue. 6. Rest and do the steps again 1 or 2 times as needed. General instructions  Make sure you get all the shots (vaccines) that your doctor recommends. Ask your doctor about a flu shot and a pneumonia shot.  Use oxygen therapy and therapy to help improve your lungs (pulmonary rehabilitation) if told by your doctor. If you need home oxygen therapy, ask your doctor if you should buy a tool to measure your oxygen level (oximeter).  Make a COPD action plan with your doctor. This helps you know what to do if you feel worse than usual.  Manage any other conditions you have as told by your doctor.  Avoid going outside when it is very hot, cold, or humid.  Avoid people who have a sickness you can catch (contagious).  Keep all follow-up visits as told by your doctor. This is important. Contact a doctor if:  You cough up more mucus than usual.  There is a change in the color or thickness of the mucus.  It is harder to breathe than usual.  Your breathing is faster than usual.  You have trouble sleeping.  You need to use your medicines more often than usual.  You have trouble doing your normal activities such as getting dressed or walking around the house. Get help right away if:  You have shortness of breath while resting.  You have shortness of breath that stops you from: ? Being able to talk. ? Doing normal activities.  Your chest hurts for longer than 5 minutes.  Your skin color is more blue than usual.  Your pulse oximeter shows that you have low oxygen for longer than 5 minutes.  You have a fever.  You feel too tired to breathe normally. Summary  Chronic obstructive pulmonary disease (COPD) is a long-term lung problem.  The way your lungs  work will never return to normal. Usually the condition gets worse over time. There are things you can do to keep yourself as healthy as possible.  Take over-the-counter and prescription medicines only as told by your doctor.  If you smoke, stop. Smoking makes the problem worse. This information is not intended to replace advice given to you by your health care provider. Make sure you discuss any questions you have with your health care provider. Document Released: 03/20/2008 Document Revised: 03/09/2016 Document Reviewed: 05/29/2013 Elsevier Interactive Patient Education  2017 Elsevier Inc.  Cough, Adult Coughing is a reflex that clears your throat and your airways. Coughing helps to heal and protect your lungs. It is normal to cough occasionally, but a cough that happens with other symptoms or lasts a long time may be a sign of a condition that needs treatment. A cough may last only 2-3 weeks (acute), or it may last longer than 8 weeks (chronic). What are the causes? Coughing is  commonly caused by:  Breathing in substances that irritate your lungs.  A viral or bacterial respiratory infection.  Allergies.  Asthma.  Postnasal drip.  Smoking.  Acid backing up from the stomach into the esophagus (gastroesophageal reflux).  Certain medicines.  Chronic lung problems, including COPD (or rarely, lung cancer).  Other medical conditions such as heart failure.  Follow these instructions at home: Pay attention to any changes in your symptoms. Take these actions to help with your discomfort:  Take medicines only as told by your health care provider. ? If you were prescribed an antibiotic medicine, take it as told by your health care provider. Do not stop taking the antibiotic even if you start to feel better. ? Talk with your health care provider before you take a cough suppressant medicine.  Drink enough fluid to keep your urine clear or pale yellow.  If the air is dry, use a cold  steam vaporizer or humidifier in your bedroom or your home to help loosen secretions.  Avoid anything that causes you to cough at work or at home.  If your cough is worse at night, try sleeping in a semi-upright position.  Avoid cigarette smoke. If you smoke, quit smoking. If you need help quitting, ask your health care provider.  Avoid caffeine.  Avoid alcohol.  Rest as needed.  Contact a health care provider if:  You have new symptoms.  You cough up pus.  Your cough does not get better after 2-3 weeks, or your cough gets worse.  You cannot control your cough with suppressant medicines and you are losing sleep.  You develop pain that is getting worse or pain that is not controlled with pain medicines.  You have a fever.  You have unexplained weight loss.  You have night sweats. Get help right away if:  You cough up blood.  You have difficulty breathing.  Your heartbeat is very fast. This information is not intended to replace advice given to you by your health care provider. Make sure you discuss any questions you have with your health care provider. Document Released: 03/31/2011 Document Revised: 03/09/2016 Document Reviewed: 12/09/2014 Elsevier Interactive Patient Education  2018 [employer].    IF you received an x-ray today, you will receive an invoice from [facility]. Please contact [facility] at [phone] with questions or concerns regarding your invoice.   IF you received labwork today, you will receive an invoice from [facility]. Please contact [facility] at [phone] with questions or concerns regarding your invoice.   Our billing staff will not be able to assist you with questions regarding bills from these companies.  You will be contacted with the lab results as soon as they are available. The fastest way to get your results is to activate your My Chart account. Instructions are located on the last page of this paperwork. If you  have not heard from [place] regarding the results in 2 weeks, please contact this office.       I personally performed the services described in this documentation, which was scribed in my presence. The recorded information has been reviewed and considered for accuracy and completeness, addended by me as needed, and agree with information above.  Signed,   Bethany Staggers, MD Primary Care at [facility].  01/05/18 10:39 AM

## 2018-01-03 NOTE — Patient Instructions (Addendum)
Use albuterol inhaler 1-2 puffs every 4-6 hours as needed for shortness of breath, wheezing, or tightness.  Prednisone should help if this is a flare of the COPD along with prior infection.   Previous antibiotic is still in your system, so I do nto see a need to start a new one at this time. Tessalon or Mucinex DM during the day (as long as the mucinex does not make you wheeze more), and saline nasal spray during the day for nasal congestion.  At bedtime, you can take a small dose of the new cough syrup as long as you are not short of breath, wheezing, or tightness as that should be treated with albuterol, not cough suppressant.   Return to the clinic or go to the nearest emergency room if any of your symptoms worsen or new symptoms occur.  Please follow up with me in the next week to recheck your cough and can review your other meds and refills at that time.   Chronic Obstructive Pulmonary Disease Chronic obstructive pulmonary disease (COPD) is a long-term (chronic) lung problem. When you have COPD, it is hard for air to get in and out of your lungs. The way your lungs work will never return to normal. Usually the condition gets worse over time. There are things you can do to keep yourself as healthy as possible. Your doctor may treat your condition with:  Medicines.  Quitting smoking, if you smoke.  Rehabilitation. This may involve a team of specialists.  Oxygen.  Exercise and changes to your diet.  Lung surgery.  Comfort measures (palliative care).  Follow these instructions at home: Medicines  Take over-the-counter and prescription medicines only as told by your doctor.  Talk to your doctor before taking any cough or allergy medicines. You may need to avoid medicines that cause your lungs to be dry. Lifestyle  If you smoke, stop. Smoking makes the problem worse. If you need help quitting, ask your doctor.  Avoid being around things that make your breathing worse. This may include  smoke, chemicals, and fumes.  Stay active, but remember to also rest.  Learn and use tips on how to relax.  Make sure you get enough sleep. Most adults need at least 7 hours a night.  Eat healthy foods. Eat smaller meals more often. Rest before meals. Controlled breathing  Learn and use tips on how to control your breathing as told by your doctor. Try: ? Breathing in (inhaling) through your nose for 1 second. Then, pucker your lips and breath out (exhale) through your lips for 2 seconds. ? Putting one hand on your belly (abdomen). Breathe in slowly through your nose for 1 second. Your hand on your belly should move out. Pucker your lips and breathe out slowly through your lips. Your hand on your belly should move in as you breathe out. Controlled coughing  Learn and use controlled coughing to clear mucus from your lungs. The steps are: 1. Lean your head a little forward. 2. Breathe in deeply. 3. Try to hold your breath for 3 seconds. 4. Keep your mouth slightly open while coughing 2 times. 5. Spit any mucus out into a tissue. 6. Rest and do the steps again 1 or 2 times as needed. General instructions  Make sure you get all the shots (vaccines) that your doctor recommends. Ask your doctor about a flu shot and a pneumonia shot.  Use oxygen therapy and therapy to help improve your lungs (pulmonary rehabilitation) if told by  your doctor. If you need home oxygen therapy, ask your doctor if you should buy a tool to measure your oxygen level (oximeter).  Make a COPD action plan with your doctor. This helps you know what to do if you feel worse than usual.  Manage any other conditions you have as told by your doctor.  Avoid going outside when it is very hot, cold, or humid.  Avoid people who have a sickness you can catch (contagious).  Keep all follow-up visits as told by your doctor. This is important. Contact a doctor if:  You cough up more mucus than usual.  There is a change in  the color or thickness of the mucus.  It is harder to breathe than usual.  Your breathing is faster than usual.  You have trouble sleeping.  You need to use your medicines more often than usual.  You have trouble doing your normal activities such as getting dressed or walking around the house. Get help right away if:  You have shortness of breath while resting.  You have shortness of breath that stops you from: ? Being able to talk. ? Doing normal activities.  Your chest hurts for longer than 5 minutes.  Your skin color is more blue than usual.  Your pulse oximeter shows that you have low oxygen for longer than 5 minutes.  You have a fever.  You feel too tired to breathe normally. Summary  Chronic obstructive pulmonary disease (COPD) is a long-term lung problem.  The way your lungs work will never return to normal. Usually the condition gets worse over time. There are things you can do to keep yourself as healthy as possible.  Take over-the-counter and prescription medicines only as told by your doctor.  If you smoke, stop. Smoking makes the problem worse. This information is not intended to replace advice given to you by your health care provider. Make sure you discuss any questions you have with your health care provider. Document Released: 03/20/2008 Document Revised: 03/09/2016 Document Reviewed: 05/29/2013 Elsevier Interactive Patient Education  2017 [employer]  Cough, Adult Coughing is a reflex that clears your throat and your airways. Coughing helps to heal and protect your lungs. It is normal to cough occasionally, but a cough that happens with other symptoms or lasts a long time may be a sign of a condition that needs treatment. A cough may last only 2-3 weeks (acute), or it may last longer than 8 weeks (chronic). What are the causes? Coughing is commonly caused by:  Breathing in substances that irritate your lungs.  A viral or bacterial respiratory  infection.  Allergies.  Asthma.  Postnasal drip.  Smoking.  Acid backing up from the stomach into the esophagus (gastroesophageal reflux).  Certain medicines.  Chronic lung problems, including COPD (or rarely, lung cancer).  Other medical conditions such as heart failure.  Follow these instructions at home: Pay attention to any changes in your symptoms. Take these actions to help with your discomfort:  Take medicines only as told by your health care provider. ? If you were prescribed an antibiotic medicine, take it as told by your health care provider. Do not stop taking the antibiotic even if you start to feel better. ? Talk with your health care provider before you take a cough suppressant medicine.  Drink enough fluid to keep your urine clear or pale yellow.  If the air is dry, use a cold steam vaporizer or humidifier in your bedroom or your home to  help loosen secretions.  Avoid anything that causes you to cough at work or at home.  If your cough is worse at night, try sleeping in a semi-upright position.  Avoid cigarette smoke. If you smoke, quit smoking. If you need help quitting, ask your health care provider.  Avoid caffeine.  Avoid alcohol.  Rest as needed.  Contact a health care provider if:  You have new symptoms.  You cough up pus.  Your cough does not get better after 2-3 weeks, or your cough gets worse.  You cannot control your cough with suppressant medicines and you are losing sleep.  You develop pain that is getting worse or pain that is not controlled with pain medicines.  You have a fever.  You have unexplained weight loss.  You have night sweats. Get help right away if:  You cough up blood.  You have difficulty breathing.  Your heartbeat is very fast. This information is not intended to replace advice given to you by your health care provider. Make sure you discuss any questions you have with your health care provider. Document  Released: 03/31/2011 Document Revised: 03/09/2016 Document Reviewed: 12/09/2014 Elsevier Interactive Patient Education  2018 [employer].    IF you received an x-ray today, you will receive an invoice from [facility]. Please contact [facility] at [phone] with questions or concerns regarding your invoice.   IF you received labwork today, you will receive an invoice from [facility]. Please contact [facility] at [phone] with questions or concerns regarding your invoice.   Our billing staff will not be able to assist you with questions regarding bills from these companies.  You will be contacted with the lab results as soon as they are available. The fastest way to get your results is to activate your My Chart account. Instructions are located on the last page of this paperwork. If you have not heard from [place] regarding the results in 2 weeks, please contact this office.

## 2018-01-15 ENCOUNTER — Encounter: Payer: Self-pay | Admitting: Family Medicine

## 2018-01-15 ENCOUNTER — Other Ambulatory Visit: Payer: Self-pay

## 2018-01-15 ENCOUNTER — Ambulatory Visit (INDEPENDENT_AMBULATORY_CARE_PROVIDER_SITE_OTHER): Payer: BLUE CROSS/BLUE SHIELD | Admitting: Family Medicine

## 2018-01-15 VITALS — BP 110/60 | HR 55 | Temp 98.1°F | Ht 65.35 in | Wt 165.2 lb

## 2018-01-15 DIAGNOSIS — R002 Palpitations: Secondary | ICD-10-CM | POA: Diagnosis not present

## 2018-01-15 DIAGNOSIS — R5383 Other fatigue: Secondary | ICD-10-CM

## 2018-01-15 DIAGNOSIS — G458 Other transient cerebral ischemic attacks and related syndromes: Secondary | ICD-10-CM

## 2018-01-15 DIAGNOSIS — E785 Hyperlipidemia, unspecified: Secondary | ICD-10-CM | POA: Diagnosis not present

## 2018-01-15 DIAGNOSIS — E039 Hypothyroidism, unspecified: Secondary | ICD-10-CM | POA: Diagnosis not present

## 2018-01-15 DIAGNOSIS — R0609 Other forms of dyspnea: Secondary | ICD-10-CM | POA: Diagnosis not present

## 2018-01-15 DIAGNOSIS — R55 Syncope and collapse: Secondary | ICD-10-CM | POA: Diagnosis not present

## 2018-01-15 DIAGNOSIS — R232 Flushing: Secondary | ICD-10-CM | POA: Diagnosis not present

## 2018-01-15 DIAGNOSIS — F418 Other specified anxiety disorders: Secondary | ICD-10-CM

## 2018-01-15 DIAGNOSIS — R0600 Dyspnea, unspecified: Secondary | ICD-10-CM

## 2018-01-15 LAB — POCT CBC
Granulocyte percent: 52.2 %G (ref 37–80)
HEMATOCRIT: 42 % (ref 37.7–47.9)
HEMOGLOBIN: 14.2 g/dL (ref 12.2–16.2)
Lymph, poc: 2.3 (ref 0.6–3.4)
MCH: 33.5 pg — AB (ref 27–31.2)
MCHC: 33.8 g/dL (ref 31.8–35.4)
MCV: 99.1 fL — AB (ref 80–97)
MID (cbc): 0.5 (ref 0–0.9)
MPV: 6.2 fL (ref 0–99.8)
POC GRANULOCYTE: 3.1 (ref 2–6.9)
POC LYMPH PERCENT: 39.7 %L (ref 10–50)
POC MID %: 8.1 % (ref 0–12)
Platelet Count, POC: 310 10*3/uL (ref 142–424)
RBC: 4.24 M/uL (ref 4.04–5.48)
RDW, POC: 12.6 %
WBC: 5.9 10*3/uL (ref 4.6–10.2)

## 2018-01-15 LAB — GLUCOSE, POCT (MANUAL RESULT ENTRY): POC Glucose: 83 mg/dl (ref 70–99)

## 2018-01-15 LAB — POCT URINALYSIS DIP (MANUAL ENTRY)
BILIRUBIN UA: NEGATIVE
BILIRUBIN UA: NEGATIVE mg/dL
GLUCOSE UA: NEGATIVE mg/dL
LEUKOCYTES UA: NEGATIVE
Nitrite, UA: NEGATIVE
Protein Ur, POC: NEGATIVE mg/dL
RBC UA: NEGATIVE
Spec Grav, UA: 1.015 (ref 1.010–1.025)
Urobilinogen, UA: 0.2 E.U./dL
pH, UA: 7 (ref 5.0–8.0)

## 2018-01-15 MED ORDER — ATORVASTATIN CALCIUM 40 MG PO TABS: 40.0000 mg | ORAL_TABLET | Freq: Every day | ORAL | 0 refills | Status: DC

## 2018-01-15 MED ORDER — CLOPIDOGREL BISULFATE 75 MG PO TABS: 75.0000 mg | ORAL_TABLET | Freq: Every day | ORAL | 1 refills | Status: DC

## 2018-01-15 MED ORDER — METOPROLOL TARTRATE 25 MG PO TABS: 12.5000 mg | ORAL_TABLET | Freq: Two times a day (BID) | ORAL | 1 refills | Status: DC

## 2018-01-15 MED ORDER — SYNTHROID 100 MCG PO TABS: 100.0000 ug | ORAL_TABLET | Freq: Every day | ORAL | 1 refills | Status: DC

## 2018-01-15 MED ORDER — FLUOXETINE HCL 20 MG PO TABS: 20.0000 mg | ORAL_TABLET | Freq: Every day | ORAL | 1 refills | Status: DC

## 2018-01-15 NOTE — Progress Notes (Signed)
Subjective:  By signing my name below, I, Bethany Clarke, attest that this documentation has been prepared under the direction and in the presence of [name], MD Electronically Signed: Charline Bills, ED Scribe 01/15/2018 at 10:05 AM.   Patient ID: Bethany Clarke, female    DOB: 1956/04/30, 62 y.o.   MRN: [id number]  Chief Complaint  Patient presents with  . medication Question  . Loss of Consciousness    (Bottoms out spells)- sx, hot, naustious, feels like going to faint.    HPI  Bethany Clarke is a 62 y.o. female who presents to Primary Care at [facility] initially scheduled for f/u but complaining of near syncope. Last seen 12 days ago for cough, treated with prednisone and been finishing z-pak. Does have a h/o carotid stenosis and subclavian artery stenosis with steal. Had PTA and stenting by Dr. Allyson Sabal in 2014, dopplers inn 2016 patent. Has remained on Plavix 75 mg qd and asa, Lipitor 40 mg qd, 12.5 mg metoprolol bid and synthroid for hypothyroidism. She had a myocardial perifusion stress test 10.9 mets on 09/27/17, low risk study with EF 54%. However, EKG abnormal with ST segment depression by Dr. Anne Fu. There was a small defect in apical inferior location. Carotid artery dopplers 11/06/17 1-39% stenosis bilaterally and L subclavian artery flow was discerned. Stable carotids. Last seen by cardiology Dr. Tresa Endo in Dec. Significant family h/o of CAD with sister CABG surgery at age 23, father who idea at young age with CA and heart disease, multiple family members with stroke and heat disease. Cardiac cath in 2014, mild osteocalcification. At OV in Dec, further cardiac eval wasn't felt to be indicated. Normal hgb, TSH, BMP and lipid panel in Dec.  Pt states spells starts at the back of her head with a numbness, moves to her chest, she becomes nauseous, experiences a hot flash, flushing to the face and lightheadedness. Also reports sob with exertion and palpitation sensation in her eyes.  Symptoms can occur 3-4 times a day, twice yesterday. She has only experienced this spells within the past 2 wks but states she had similar symptoms several yrs ago. Denies syncope, wheezing, increased anxiety, fever, cp, chest tightness, pain radiating into arms, calf pain/tenderness, leg swelling, facial asymmetry, weakness, slurred speech, changes in vision. Denies recent use of Klonopin, alcohol use, changes in medication or missed doses.  Patient Active Problem List   Diagnosis Date Noted  . Chest pain, atypical 03/17/2016  . Paresthesia 07/14/2013  . Bilateral foot pain 05/01/2013  . Subclavian artery stenosis, left (HCC)   . Subclavian steal syndrome 03/16/2013  . Subclavian arterial stenosis (HCC) 03/11/2013  . Carotid stenosis 03/11/2013  . Hyperlipidemia with target LDL less than 70 03/11/2013  . GERD (gastroesophageal reflux disease)   . Hypothyroid   . Dizziness   . Syncope, near   . Heart burn   . Hypothyroidism 05/28/2007  . COPD GOLD II with reversibility  05/28/2007   Past Medical History:  Diagnosis Date  . Atherosclerosis 11/14/2005   carotid US - mild calcific non-stenotic plaque bilaterally  . Carotid bruit 02/05/2009   Doppler - R/L ICAs 0-49% diameter reductin (velocities suggest low-mid range); L subclavian 0-49%  diameter reduction  . Chronic bronchitis (HCC)    "growing up; not anymore" (03/14/2013)  . Colon polyp    hyperplastic  . Costochondritis   . Dizziness   . Emphysema of lung (HCC)   . GERD (gastroesophageal reflux disease)   . H. pylori infection   .  Heart burn   . Hemorrhoids   . Hypothyroid   . Lipidemia   . Pneumonia ~ 2009  . Subclavian artery stenosis, left (HCC)    subclavian steal physiology status post PTA and stenting  . Syncope, near    Past Surgical History:  Procedure Laterality Date  . ABDOMINAL HYSTERECTOMY  1999  . BILATERAL UPPER EXTREMITY ANGIOGRAM N/A 03/14/2013   Procedure: BILATERAL UPPER EXTREMITY ANGIOGRAM;  Surgeon:  [name], MD;  Location: [facility] CATH LAB;  Service: Cardiovascular;  Laterality: N/A;  . BREAST BIOPSY Right 1990's   "benign" (03/14/2013)  . CARDIAC CATHETERIZATION  03/03/2013  . DILATION AND CURETTAGE OF UTERUS  1980's  . LEFT HEART CATHETERIZATION WITH CORONARY ANGIOGRAM Bilateral 03/03/2013   Procedure: LEFT HEART CATHETERIZATION WITH CORONARY ANGIOGRAM;  Surgeon: Lennette Bihari, MD;  Location: [facility] CATH LAB;  Service: Cardiovascular;  Laterality: Bilateral;  . PERCUTANEOUS STENT INTERVENTION  03/14/2013   Procedure: PERCUTANEOUS STENT INTERVENTION;  Surgeon: [name], MD;  Location: [facility] CATH LAB;  Service: Cardiovascular;;  . SUBCLAVIAN ARTERY STENT Left 03/14/2013    for subclavian steal/claudication (03/14/2013)  . TUBAL LIGATION  1980's   No Known Allergies Prior to Admission medications   Medication Sig Start Date End Date Taking? Authorizing Provider  albuterol (PROVENTIL HFA;VENTOLIN HFA) 108 (90 Base) MCG/ACT inhaler Inhale 1-2 puffs into the lungs every 4 (four) hours as needed for wheezing or shortness of breath. 03/17/16   Nyoka Cowden, MD  aspirin 81 MG tablet Take 81 mg by mouth daily.    [provider]  atorvastatin (LIPITOR) 40 MG tablet TAKE 1 TABLET BY MOUTH EVERY DAY 10/01/17   [name], MD  clonazePAM (KLONOPIN) 0.5 MG tablet TAKE 1 TABLET BY MOUTH TWICE A DAY AS NEEDED FOR ANXIETY 10/08/17   [name], MD  clopidogrel (PLAVIX) 75 MG tablet TAKE 1 TABLET BY MOUTH EVERY DAY 10/01/17   [name], MD  FLUoxetine (PROZAC) 20 MG tablet TAKE 1 TABLET BY MOUTH EVERY DAY 12/17/17   [name], MD  HYDROcodone-homatropine [facility]) 5-1.5 MG/5ML syrup 25m by mouth a bedtime as needed for cough. 01/03/18   [name], MD  levothyroxine (SYNTHROID) 100 MCG tablet TAKE 1 TABLET BY MOUTH DAILY BEFORE BREAKFAST 07/11/17   [name], MD  metoprolol tartrate (LOPRESSOR) 25 MG tablet Take 0.5 tablets (12.5 mg total) by mouth 2  (two) times daily. 09/11/17 12/10/17  Lennette Bihari, MD  predniSONE (DELTASONE) 20 MG tablet Take 2 tablets (40 mg total) by mouth daily with breakfast. 01/03/18   [name], MD   Social History   Socioeconomic History  . Marital status: Married    Spouse name: Not on file  . Number of children: 3  . Years of education: Not on file  . Highest education level: Not on file  Occupational History  . Occupation: OFFICE MANGER    Employer: [employer]  Social Needs  . Financial resource strain: Not on file  . Food insecurity:    Worry: Not on file    Inability: Not on file  . Transportation needs:    Medical: Not on file    Non-medical: Not on file  Tobacco Use  . Smoking status: Former Smoker    Packs/day: 0.50    Years: 40.00    Pack years: 20.00    Types: Cigarettes    Last attempt to quit: 02/11/2013    Years since quitting: 4.9  .  Smokeless tobacco: Never Used  Substance and Sexual Activity  . Alcohol use: No  . Drug use: No  . Sexual activity: Yes    Comment: number of sex partners in the last 12 months  1  Lifestyle  . Physical activity:    Days per week: Not on file    Minutes per session: Not on file  . Stress: Not on file  Relationships  . Social connections:    Talks on phone: Not on file    Gets together: Not on file    Attends religious service: Not on file    Active member of club or organization: Not on file    Attends meetings of clubs or organizations: Not on file    Relationship status: Not on file  . Intimate partner violence:    Fear of current or ex partner: Not on file    Emotionally abused: Not on file    Physically abused: Not on file    Forced sexual activity: Not on file  Other Topics Concern  . Not on file  Social History Narrative   Daily caffeine. Married. Education: [employer]. Exercise: No.   Review of Systems  Constitutional: Negative for fever.  Eyes: Negative for visual disturbance.  Respiratory: Positive for  shortness of breath (with exertion). Negative for chest tightness and wheezing.   Cardiovascular: Negative for chest pain and leg swelling.  Gastrointestinal: Positive for nausea.  Musculoskeletal: Negative for myalgias.  Skin: Positive for color change.  Neurological: Positive for light-headedness and numbness. Negative for syncope, facial asymmetry and speech difficulty.  Psychiatric/Behavioral: The patient is not nervous/anxious (unchanged).       Objective:   Physical Exam  Constitutional: She is oriented to person, place, and time. She appears well-developed and well-nourished.  HENT:  Head: Normocephalic and atraumatic.  Eyes: Pupils are equal, round, and reactive to light. Conjunctivae and EOM are normal.  Neck: Carotid bruit is not present.  Cardiovascular: Normal rate, regular rhythm, normal heart sounds and intact distal pulses.  Pulmonary/Chest: Effort normal and breath sounds normal.  Abdominal: Soft. She exhibits no pulsatile midline mass. There is no tenderness.  Musculoskeletal:  Calves are nontender.  Neurological: She is alert and oriented to person, place, and time. She displays a negative Romberg sign.  No pronator drift. Normal heel to toe. Normal finger to nose.  Skin: Skin is warm and dry.  Psychiatric: She has a normal mood and affect. Her behavior is normal.  Vitals reviewed.  Vitals:   01/15/18 0928 01/15/18 0933  BP: (!) 100/58 110/60  Pulse: (!) 55   Temp: 98.1 F (36.7 C)   TempSrc: Oral   SpO2: 94%   Weight: 165 lb 3.2 oz (74.9 kg)   Height: 5' 5.35" (1.66 m)      Results for orders placed or performed in visit on 01/15/18  POCT CBC  Result Value Ref Range   WBC 5.9 4.6 - 10.2 K/uL   Lymph, poc 2.3 0.6 - 3.4   POC LYMPH PERCENT 39.7 10 - 50 %L   MID (cbc) 0.5 0 - 0.9   POC MID % 8.1 0 - 12 %M   POC Granulocyte 3.1 2 - 6.9   Granulocyte percent 52.2 37 - 80 %G   RBC 4.24 4.04 - 5.48 M/uL   Hemoglobin 14.2 12.2 - 16.2 g/dL   HCT, POC 16.1  09.6 - 47.9 %   MCV 99.1 (A) 80 - 97 fL   MCH, POC 33.5 (A)  27 - 31.2 pg   MCHC 33.8 31.8 - 35.4 g/dL   RDW, POC 16.112.6 %   Platelet Count, POC 310 142 - 424 K/uL   MPV 6.2 0 - 99.8 fL  POCT glucose (manual entry)  Result Value Ref Range   POC Glucose 83 70 - 99 mg/dl  POCT urinalysis dipstick  Result Value Ref Range   Color, UA yellow yellow   Clarity, UA clear clear   Glucose, UA negative negative mg/dL   Bilirubin, UA negative negative   Ketones, POC UA negative negative mg/dL   Spec Grav, UA 0.9601.015 4.5401.010 - 1.025   Blood, UA negative negative   pH, UA 7.0 5.0 - 8.0   Protein Ur, POC negative negative mg/dL   Urobilinogen, UA 0.2 0.2 or 1.0 E.U./dL   Nitrite, UA Negative Negative   Leukocytes, UA Negative Negative   EKG reading done by Shade FloodJeffrey R Laketta Soderberg, MD: marked sinus bradycardia rate of 48, PR 158, QT 446. No apparent ST/T wave concerning findings. Without apparent changes from prior EKG in Dec.    Assessment & Plan:    Bethany Clarke is a 62 y.o. female Near syncope - Plan: POCT CBC, POCT glucose (manual entry), EKG 12-Lead, POCT urinalysis dipstick Flushing - Plan: TSH, Orthostatic vital signs Fatigue, unspecified type - Plan: POCT glucose (manual entry), EKG 12-Lead, Comprehensive metabolic panel, TSH, Orthostatic vital signs, POCT urinalysis dipstick DOE (dyspnea on exertion) - Plan: EKG 12-Lead Palpitations - Plan: EKG 12-Lead, DISCONTINUED: metoprolol tartrate (LOPRESSOR) 25 MG tablet  -Episodes as noted above.  Objectively she does have a bradycardia that may be contributing.  Reassuring urinalysis, nonfocal neuro exam, reassuring CBC and glucose.  -Initially will hold metoprolol to see if this all may be related to bradycardia, maintain hydration, orthostatic precautions, and ER/RTC precautions if worsening  -Check CMP, TSH, but unlikely cause  -recommended follow-up with cardiology to also discuss the symptoms  Depression with anxiety - Plan: FLUoxetine  (PROZAC) 20 MG tablet  -Overall stable on Prozac, continue same dose.  Subclavian steal syndrome - Plan: clopidogrel (PLAVIX) 75 MG tablet  -Follow-up with cardiology as above, continue Plavix, aspirin  Hyperlipidemia, unspecified hyperlipidemia type - Plan: atorvastatin (LIPITOR) 40 MG tablet  -Tolerating atorvastatin, continue same dose  Hypothyroidism, unspecified type - Plan: TSH, SYNTHROID 100 MCG tablet  -Check TSH, continue Synthroid same dose.  Meds ordered this encounter  Medications  . DISCONTD: metoprolol tartrate (LOPRESSOR) 25 MG tablet    Sig: Take 0.5 tablets (12.5 mg total) by mouth 2 (two) times daily.    Dispense:  180 tablet    Refill:  1  . FLUoxetine (PROZAC) 20 MG tablet    Sig: Take 1 tablet (20 mg total) by mouth daily.    Dispense:  90 tablet    Refill:  1  . clopidogrel (PLAVIX) 75 MG tablet    Sig: Take 1 tablet (75 mg total) by mouth daily.    Dispense:  90 tablet    Refill:  1  . atorvastatin (LIPITOR) 40 MG tablet    Sig: Take 1 tablet (40 mg total) by mouth daily.    Dispense:  90 tablet    Refill:  0  . SYNTHROID 100 MCG tablet    Sig: Take 1 tablet (100 mcg total) by mouth daily before breakfast.    Dispense:  90 tablet    Refill:  1   Patient Instructions   Blood sugar, blood counts, urine test overall reassuring.  I  did send off other blood work.  EKG looks overall stable except for low heart rate, which may be contributing to your symptoms. I would recommend stopping the metoprolol for now.  Make sure you are drinking sufficient fluids. I would also recommend calling cardiology to schedule appointment within the next week to discuss these symptoms further.  Let me know if they need a referral.  Recheck either with myself or cardiology within the next 1 week to see if the symptoms are improving. Return to the clinic or go to the nearest emergency room if any of your symptoms worsen or new symptoms occur.   Fatigue Fatigue is feeling tired  all of the time, a lack of energy, or a lack of motivation. Occasional or mild fatigue is often a normal response to activity or life in general. However, long-lasting (chronic) or extreme fatigue may indicate an underlying medical condition. Follow these instructions at home: Watch your fatigue for any changes. The following actions may help to lessen any discomfort you are feeling:  Talk to your health care provider about how much sleep you need each night. Try to get the required amount every night.  Take medicines only as directed by your health care provider.  Eat a healthy and nutritious diet. Ask your health care provider if you need help changing your diet.  Drink enough fluid to keep your urine clear or pale yellow.  Practice ways of relaxing, such as yoga, meditation, massage therapy, or acupuncture.  Exercise regularly.  Change situations that cause you stress. Try to keep your work and personal routine reasonable.  Do not abuse illegal drugs.  Limit alcohol intake to no more than 1 drink per day for nonpregnant women and 2 drinks per day for men. One drink equals 12 ounces of beer, 5 ounces of wine, or 1 ounces of hard liquor.  Take a multivitamin, if directed by your health care provider.  Contact a health care provider if:  Your fatigue does not get better.  You have a fever.  You have unintentional weight loss or gain.  You have headaches.  You have difficulty: ? Falling asleep. ? Sleeping throughout the night.  You feel angry, guilty, anxious, or sad.  You are unable to have a bowel movement (constipation).  You skin is dry.  Your legs or another part of your body is swollen. Get help right away if:  You feel confused.  Your vision is blurry.  You feel faint or pass out.  You have a severe headache.  You have severe abdominal, pelvic, or back pain.  You have chest pain, shortness of breath, or an irregular or fast heartbeat.  You are unable  to urinate or you urinate less than normal.  You develop abnormal bleeding, such as bleeding from the rectum, vagina, nose, lungs, or nipples.  You vomit blood.  You have thoughts about harming yourself or committing suicide.  You are worried that you might harm someone else. This information is not intended to replace advice given to you by your health care provider. Make sure you discuss any questions you have with your health care provider. Document Released: 07/30/2007 Document Revised: 03/09/2016 Document Reviewed: 02/03/2014 Elsevier Interactive Patient Education  2018 [employer].    IF you received an x-ray today, you will receive an invoice from [facility]. Please contact [facility] at [phone] with questions or concerns regarding your invoice.   IF you received labwork today, you will receive an invoice from [employer] [facility].  Please contact [facility] at [phone] with questions or concerns regarding your invoice.   Our billing staff will not be able to assist you with questions regarding bills from these companies.  You will be contacted with the lab results as soon as they are available. The fastest way to get your results is to activate your My Chart account. Instructions are located on the last page of this paperwork. If you have not heard from [place] regarding the results in 2 weeks, please contact this office.       I personally performed the services described in this documentation, which was scribed in my presence. The recorded information has been reviewed and considered for accuracy and completeness, addended by me as needed, and agree with information above.  Signed,   Meredith Staggers, MD Primary Care at [facility].  01/18/18 12:24 PM

## 2018-01-15 NOTE — Patient Instructions (Addendum)
Blood sugar, blood counts, urine test overall reassuring.  I did send off other blood work.  EKG looks overall stable except for low heart rate, which may be contributing to your symptoms. I would recommend stopping the metoprolol for now.  Make sure you are drinking sufficient fluids. I would also recommend calling cardiology to schedule appointment within the next week to discuss these symptoms further.  Let me know if they need a referral.  Recheck either with myself or cardiology within the next 1 week to see if the symptoms are improving. Return to the clinic or go to the nearest emergency room if any of your symptoms worsen or new symptoms occur.   Fatigue Fatigue is feeling tired all of the time, a lack of energy, or a lack of motivation. Occasional or mild fatigue is often a normal response to activity or life in general. However, long-lasting (chronic) or extreme fatigue may indicate an underlying medical condition. Follow these instructions at home: Watch your fatigue for any changes. The following actions may help to lessen any discomfort you are feeling:  Talk to your health care provider about how much sleep you need each night. Try to get the required amount every night.  Take medicines only as directed by your health care provider.  Eat a healthy and nutritious diet. Ask your health care provider if you need help changing your diet.  Drink enough fluid to keep your urine clear or pale yellow.  Practice ways of relaxing, such as yoga, meditation, massage therapy, or acupuncture.  Exercise regularly.  Change situations that cause you stress. Try to keep your work and personal routine reasonable.  Do not abuse illegal drugs.  Limit alcohol intake to no more than 1 drink per day for nonpregnant women and 2 drinks per day for men. One drink equals 12 ounces of beer, 5 ounces of wine, or 1 ounces of hard liquor.  Take a multivitamin, if directed by your health care  provider.  Contact a health care provider if:  Your fatigue does not get better.  You have a fever.  You have unintentional weight loss or gain.  You have headaches.  You have difficulty: ? Falling asleep. ? Sleeping throughout the night.  You feel angry, guilty, anxious, or sad.  You are unable to have a bowel movement (constipation).  You skin is dry.  Your legs or another part of your body is swollen. Get help right away if:  You feel confused.  Your vision is blurry.  You feel faint or pass out.  You have a severe headache.  You have severe abdominal, pelvic, or back pain.  You have chest pain, shortness of breath, or an irregular or fast heartbeat.  You are unable to urinate or you urinate less than normal.  You develop abnormal bleeding, such as bleeding from the rectum, vagina, nose, lungs, or nipples.  You vomit blood.  You have thoughts about harming yourself or committing suicide.  You are worried that you might harm someone else. This information is not intended to replace advice given to you by your health care provider. Make sure you discuss any questions you have with your health care provider. Document Released: 07/30/2007 Document Revised: 03/09/2016 Document Reviewed: 02/03/2014 Elsevier Interactive Patient Education  2018 [employer].    IF you received an x-ray today, you will receive an invoice from [facility]. Please contact [facility] at [phone] with questions or concerns regarding your invoice.   IF you received  labwork today, you will receive an invoice from [facility]. Please contact [facility] at [phone] with questions or concerns regarding your invoice.   Our billing staff will not be able to assist you with questions regarding bills from these companies.  You will be contacted with the lab results as soon as they are available. The fastest way to get your results is to activate your My Chart account.  Instructions are located on the last page of this paperwork. If you have not heard from us regarding the results in 2 weeks, please contact this office.

## 2018-01-16 ENCOUNTER — Encounter: Payer: Self-pay | Admitting: Cardiovascular Disease

## 2018-01-16 ENCOUNTER — Ambulatory Visit (INDEPENDENT_AMBULATORY_CARE_PROVIDER_SITE_OTHER): Payer: BLUE CROSS/BLUE SHIELD | Admitting: Cardiovascular Disease

## 2018-01-16 VITALS — BP 98/64 | Ht 65.0 in | Wt 165.0 lb

## 2018-01-16 DIAGNOSIS — R9431 Abnormal electrocardiogram [ECG] [EKG]: Secondary | ICD-10-CM | POA: Diagnosis not present

## 2018-01-16 DIAGNOSIS — R42 Dizziness and giddiness: Secondary | ICD-10-CM

## 2018-01-16 DIAGNOSIS — E039 Hypothyroidism, unspecified: Secondary | ICD-10-CM

## 2018-01-16 DIAGNOSIS — Z79899 Other long term (current) drug therapy: Secondary | ICD-10-CM | POA: Diagnosis not present

## 2018-01-16 DIAGNOSIS — I6523 Occlusion and stenosis of bilateral carotid arteries: Secondary | ICD-10-CM | POA: Diagnosis not present

## 2018-01-16 DIAGNOSIS — R55 Syncope and collapse: Secondary | ICD-10-CM

## 2018-01-16 DIAGNOSIS — I771 Stricture of artery: Secondary | ICD-10-CM

## 2018-01-16 DIAGNOSIS — E785 Hyperlipidemia, unspecified: Secondary | ICD-10-CM

## 2018-01-16 LAB — COMPREHENSIVE METABOLIC PANEL
A/G RATIO: 1.8 (ref 1.2–2.2)
ALK PHOS: 69 IU/L (ref 39–117)
ALT: 29 IU/L (ref 0–32)
AST: 19 IU/L (ref 0–40)
Albumin: 4.7 g/dL (ref 3.6–4.8)
BUN / CREAT RATIO: 15 (ref 12–28)
BUN: 12 mg/dL (ref 8–27)
Bilirubin Total: 0.2 mg/dL (ref 0.0–1.2)
CO2: 24 mmol/L (ref 20–29)
CREATININE: 0.8 mg/dL (ref 0.57–1.00)
Calcium: 10 mg/dL (ref 8.7–10.3)
Chloride: 100 mmol/L (ref 96–106)
GFR calc Af Amer: 92 mL/min/{1.73_m2} (ref >59)
GFR, EST NON AFRICAN AMERICAN: 80 mL/min/{1.73_m2} (ref >59)
GLOBULIN, TOTAL: 2.6 g/dL (ref 1.5–4.5)
Glucose: 86 mg/dL (ref 65–99)
Potassium: 5.2 mmol/L (ref 3.5–5.2)
SODIUM: 141 mmol/L (ref 134–144)
Total Protein: 7.3 g/dL (ref 6.0–8.5)

## 2018-01-16 LAB — TSH: TSH: 2.97 u[IU]/mL (ref 0.450–4.500)

## 2018-01-16 NOTE — Patient Instructions (Signed)
Medication Instructions:  Take 1/4 tablet of metoprolol tonight-then ok to STOP  Follow-Up: Your physician wants you to follow-up in: 6 months with Dr. Tresa EndoKelly.  You will receive a reminder letter in the mail two months in advance. If you don't receive a letter, please call our office to schedule the follow-up appointment.   Any Other Special Instructions Will Be Listed Below (If Applicable).     If you need a refill on your cardiac medications before your next appointment, please call your pharmacy.

## 2018-01-16 NOTE — Progress Notes (Signed)
Patient ID: Bethany Clarke, female   DOB: 1956/05/21, 62 y.o.   MRN: [id number]    HPI: Bethany Clarke is a 62 y.o. female who presents for a 5 month follow-up cardiology evaluation.   Bethany Clarke  has a history of hypothyroidism and hyperlipidemia.  In September 2013, she  had an endoscopy for epigastric discomfort and was treated with Nexium. She also was told to have sludge in her gallbladder and some inflammation of her bile duct, for which she has seen Dr. Henrene Pastor. The patient has a very strong family history for coronary artery disease with a sister, who underwent CABG surgery at age 66, as well as a father, who died at a young age with cancer but also had heart disease, and also many family members with stroke and heart disease. The patient had a 44 year history of tobacco use, having started at age 61. She has noticed recurrent episodes of epigastric and somewhat right-sided chest discomfort.  When I saw her back then  He complained of a progressive decline in activity with development of exertional shortness of breath  And also had awakened from sleep with chest pain.  She ultimately underwentcardiac catheterization was on Mar 03, 2013  Which demonstratednormal LV function without definite obstructive disease. Her left artery system was entirely normal. She did have mild ostial calcification of her right coronary artery without stenosis.   When I saw her initially on Feb 19, 2013 she had a 30 mm blood pressure differential between her right and left arm. Subsequent carotid duplex imaging suggested proximal left subclavian occlusion. She did have 0-49% stenosis in the right internal carotid artery and had progressive stenosis in her left mid internal carotid now showing a 50-69% diameter reduction. She had retrograde flow in her left vertebral artery suggestive of subclavian steal. In retrospect, she does admit to episodes of dizziness. Upper extremity duplex imaging revealed a 38 mm reduction in  left brachial artery pressure. A 2-D echo Doppler study showed ejection fraction of 55-60% with normal diastolic parameters and mildly thickened mitral valve leaflets appear.   I referred Bethany Clarke to Dr. Gwenlyn Found and she ultimately underwent aortic arch angiography and bilateral subclavian angiography which revealed a 99% proximal left subclavian artery stenosis with retrograde filling of the left vertebral system. Her subclavian artery was successfully stented.  She had marked improvement in her subclavian steal symptoms and her left upper extremity Doppler normalized.  She had stopped smoking.  She had gained weight.  Follow-up Doppler studies in December 2015 were not significantly changed since the year previously in the right brachial pressure was 143, and left brachial pressure 138 arguing against upper extremity inflow disease. Carotid studies in December 2015 showed mild plaque without high-grade stenosis.  Her left vertebral artery was not visualized.  Since I last saw her in September 2016, she had remained fairly stable.  She underwent a carotid Doppler study in January 2018 which showed heterogeneous plaque bilaterally.  There was stable 1-39% right internal carotid stenoses and stable 40-59% left internal carotid.  She had a patent left subclavian artery stent without evidence for restenosis.  She had a normal right subclavian artery, and patent vertebral arteries with antegrade flow.  She quit smoking 4 years ago.  She had gained weight after quitting smoking but in May started a ketone diet and has lost 40 pounds since May.  She has been active and walks on a treadmill and denies any exertionally precipitated chest tightness or significant  dyspnea.  Scheduling evening, she had experienced an episode of lower sternal mid epigastric discomfort which lasted approximately 5 minutes, but was associated with intense pain.  She had another episode of chest pain 2 days later which also was  nonexertional.  She had not experienced any chest tightness on walking 2 miles on a treadmill.    She has had some issues of right upper quadrant discomfort.  In the past.  She was noted to have sludge in her gallbladder. She is unaware of palpitations.  She has been taking atorvastatin 40 mg for hyperlipidemia.  She continues to be on aspirin and Plavix following her subclavian stent.  She also is on levothyroxine 100 g for hypothyroidism.   When I saw her last year, I recommended that she undergo a nuclear stress test and 2-D echo Doppler study for further evaluation of her symptoms and ECG changes.  I had empirically started her on metoprolol 12.5 mg twice a day.  She has felt well on this therapy.  She denies any recurrent episodes of chest tightness.  If she has experienced an episode of chest pain.  It has been nonexertional and lasts 15-30 seconds and is very fleeting and resolve spontaneously.  She denies any exertional dyspnea.  The echo Doppler study on 09/19/2017 showed an EF of 55-60%.  There was grade 2 diastolic dysfunction.  There was trivial AR and MR.  She had normal pulmonary pressures.  A nuclear stress test was low risk and showed an EF of 54%.  There was a small apical inferior defect without associated ischemia.  She had her baseline abnormal ECG changes.  She had a normal TID calculation.    Since I last saw her she had developed some mild lightheadedness and a sense of flushing.  She was evaluated her primary physician yesterday Dr. Cindee Lame who recommended discontinuance of metoprolol.  She had been on low dose at 12.5 mg twice a day.  Her last dose of metoprolol was yesterday morning.  She denies any chest tightness.  She denies any palpitations.  She denies significant dyspnea.  She presents for reevaluation.  ALLERGIES: SHE DENIES ANY ALLERGIES.   Past Medical History:  Diagnosis Date  . Atherosclerosis 11/14/2005   carotid US - mild calcific non-stenotic plaque  bilaterally  . Carotid bruit 02/05/2009   Doppler - R/L ICAs 0-49% diameter reductin (velocities suggest low-mid range); L subclavian 0-49%  diameter reduction  . Chronic bronchitis ([place])    "growing up; not anymore" (03/14/2013)  . Colon polyp    hyperplastic  . Costochondritis   . Dizziness   . Emphysema of lung ([place])   . GERD (gastroesophageal reflux disease)   . H. pylori infection   . Heart burn   . Hemorrhoids   . Hypothyroid   . Lipidemia   . Pneumonia ~ 2009  . Subclavian artery stenosis, left ([facility])    subclavian steal physiology status post PTA and stenting  . Syncope, near     Past Surgical History:  Procedure Laterality Date  . ABDOMINAL HYSTERECTOMY  1999  . BILATERAL UPPER EXTREMITY ANGIOGRAM N/A 03/14/2013   Procedure: BILATERAL UPPER EXTREMITY ANGIOGRAM;  Surgeon: Lorretta Harp, MD;  Location: [facility] CATH LAB;  Service: Cardiovascular;  Laterality: N/A;  . BREAST BIOPSY Right 1990's   "benign" (03/14/2013)  . CARDIAC CATHETERIZATION  03/03/2013  . DILATION AND CURETTAGE OF UTERUS  1980's  . LEFT HEART CATHETERIZATION WITH CORONARY ANGIOGRAM Bilateral 03/03/2013   Procedure: LEFT HEART  CATHETERIZATION WITH CORONARY ANGIOGRAM;  Surgeon: Troy Sine, MD;  Location[facility] CATH LAB;  Service: Cardiovascular;  Laterality: Bilateral;  . PERCUTANEOUS STENT INTERVENTION  03/14/2013   Procedure: PERCUTANEOUS STENT INTERVENTION;  Surgeon: Lorretta Harp, MD;  Location: [facility] CATH LAB;  Service: Cardiovascular;;  . SUBCLAVIAN ARTERY STENT Left 03/14/2013    for subclavian steal/claudication (03/14/2013)  . TUBAL LIGATION  1980's    No Known Allergies  Current Outpatient Medications  Medication Sig Dispense Refill  . albuterol (PROVENTIL HFA;VENTOLIN HFA) 108 (90 Base) MCG/ACT inhaler Inhale 1-2 puffs into the lungs every 4 (four) hours as needed for wheezing or shortness of breath. 1 Inhaler 2  . aspirin 81 MG tablet Take 81 mg by mouth daily.    Marland Kitchen atorvastatin (LIPITOR) 40 MG  tablet Take 1 tablet (40 mg total) by mouth daily. 90 tablet 0  . clonazePAM (KLONOPIN) 0.5 MG tablet TAKE 1 TABLET BY MOUTH TWICE A DAY AS NEEDED FOR ANXIETY 20 tablet 1  . clopidogrel (PLAVIX) 75 MG tablet Take 1 tablet (75 mg total) by mouth daily. 90 tablet 1  . FLUoxetine (PROZAC) 20 MG tablet Take 1 tablet (20 mg total) by mouth daily. 90 tablet 1  . SYNTHROID 100 MCG tablet Take 1 tablet (100 mcg total) by mouth daily before breakfast. 90 tablet 1   Current Facility-Administered Medications  Medication Dose Route Frequency Provider Last Rate Last Dose  . albuterol (PROVENTIL) (2.5 MG/3ML) 0.083% nebulizer solution 2.5 mg  2.5 mg Nebulization Once Wendie Agreste, MD      . ipratropium (ATROVENT) nebulizer solution 0.5 mg  0.5 mg Nebulization Once Wendie Agreste, MD        SOCIAL HISTORY: She is married, has three children. She works for [employer], who is also my patient, who recommended my name for the patient to see. The patient does not routinely exercise. There is no alcohol use. She had smoked for 44 years, and quit smoking in May 2014.  ROS General: Negative; No fevers, chills, or night sweats;  HEENT: Negative; No changes in vision or hearing, sinus congestion, difficulty swallowing Pulmonary: Negative; No cough, wheezing, shortness of breath, hemoptysis Cardiovascular: see HPI GI:  Positive for transient right upper quadrant discomfort GU: Negative; No dysuria, hematuria, or difficulty voiding Musculoskeletal: Negative; no myalgias, joint pain, or weakness Hematologic/Oncology: Negative; no easy bruising, bleeding Endocrine: Negative; no heat/cold intolerance; no diabetes Neuro: Negative; no changes in balance, headaches Skin: Negative; No rashes or skin lesions Psychiatric: Negative; No behavioral problems, depression Sleep: Negative; No snoring, daytime sleepiness, hypersomnolence, bruxism, restless legs, hypnogognic hallucinations, no cataplexy Other  comprehensive 14 point system review is negative.   PE BP 98/64 (BP Location: Right Arm, Patient Position: Sitting, Cuff Size: Normal)   Ht 5' 5"  (1.651 m)   Wt 165 lb (74.8 kg)   BMI 27.46 kg/m    Repeat blood pressure by me was 108/62 supine and 110/62 standing.  Wt Readings from Last 3 Encounters:  01/16/18 165 lb (74.8 kg)  01/15/18 165 lb 3.2 oz (74.9 kg)  01/03/18 164 lb 12.8 oz (74.8 kg)   General: Alert, oriented, no distress.  Skin: normal turgor, no rashes, warm and dry HEENT: Normocephalic, atraumatic. Pupils equal round and reactive to light; sclera anicteric; extraocular muscles intact;  Nose without nasal septal hypertrophy Mouth/Parynx benign; Mallinpatti scale 3 Neck: No JVD, soft left carotid bruits; normal carotid upstroke Lungs: clear to ausculatation and percussion; no wheezing or rales Chest wall: without  tenderness to palpitation Heart: PMI not displaced, RRR, s1 s2 normal, 1/6 systolic murmur, no diastolic murmur, no rubs, gallops, thrills, or heaves Abdomen: soft, nontender; no hepatosplenomehaly, BS+; abdominal aorta nontender and not dilated by palpation. Back: no CVA tenderness Pulses 2+ Musculoskeletal: full range of motion, normal strength, no joint deformities Extremities: no clubbing cyanosis or edema, Homan's sign negative  Neurologic: grossly nonfocal; Cranial nerves grossly wnl Psychologic: Normal mood and affect   ECG (independently read by me): Sinus rhythm at 71 bpm.  Nonspecific ST changes.  QTc interval 410 ms.  December 2018 ECG (independently read by me): Sinus bradycardia 53 bpm.  Nonspecific T-wave abnormality anteriorly.  November 2017 ECG (independently read by me): Normal sinus rhythm at 79 bpm.  Inferior and anterolateral ST-T changes which are more pronounced when compared to prior ECGs  September 2016 ECG (independently read by me):  Normal sinus rhythm at 64 bpm.  Nonspecific T changes anteriorly  May 2014 ECG: NSR at 63,  anterior T wave abnormality  LABS: BMP Latest Ref Rng & Units 01/15/2018 09/27/2017 04/06/2017  Glucose 65 - 99 mg/dL 86 80 74  BUN 8 - 27 mg/dL 12 10 12   Creatinine 0.57 - 1.00 mg/dL 0.80 0.77 0.66  BUN/Creat Ratio 12 - 28 15 13 18   Sodium 134 - 144 mmol/L 141 139 138  Potassium 3.5 - 5.2 mmol/L 5.2 4.6 5.1  Chloride 96 - 106 mmol/L 100 98 99  CO2 20 - 29 mmol/L 24 28 23   Calcium 8.7 - 10.3 mg/dL 10.0 9.9 10.3   Hepatic Function Latest Ref Rng & Units 01/15/2018 04/06/2017 12/01/2015  Total Protein 6.0 - 8.5 g/dL 7.3 7.8 7.6  Albumin 3.6 - 4.8 g/dL 4.7 4.9(H) 4.6  AST 0 - 40 IU/L 19 24 24   ALT 0 - 32 IU/L 29 36(H) 44(H)  Alk Phosphatase 39 - 117 IU/L 69 91 62  Total Bilirubin 0.0 - 1.2 mg/dL 0.2 0.3 0.3  Bilirubin, Direct 0.0 - 0.3 mg/dL - - -   CBC Latest Ref Rng & Units 01/15/2018 09/27/2017 10/01/2015  WBC 4.6 - 10.2 K/uL 5.9 3.8 5.2  Hemoglobin 12.2 - 16.2 g/dL 14.2 13.3 14.1  Hematocrit 37.7 - 47.9 % 42.0 39.8 42.6  Platelets 150 - 379 x10E3/uL - 263 305.0   Lab Results  Component Value Date   MCV 99.1 (A) 01/15/2018   MCV 101 (H) 09/27/2017   MCV 98.0 10/01/2015   Lab Results  Component Value Date   TSH 2.970 01/15/2018   Lab Results  Component Value Date   HGBA1C 5.4 06/24/2013   Lipid Panel     Component Value Date/Time   CHOL 123 09/27/2017 1528   TRIG 49 09/27/2017 1528   HDL 67 09/27/2017 1528   CHOLHDL 1.8 09/27/2017 1528   CHOLHDL 2.5 12/01/2015 1712   VLDL 27 12/01/2015 1712   LDLCALC 46 09/27/2017 1528     RADIOLOGY: No results found.  IMPRESSION:  1. Bilateral carotid artery stenosis   2. Postural dizziness with presyncope   3. Abnormal ECG   4. Hyperlipidemia with target LDL less than 70   5. Medication management   6. Hypothyroidism, unspecified type   7. Subclavian artery stenosis, left [facility]), s/p stent 02/2013     ASSESSMENT AND PLAN: Ms. Annie Main is a 62 year old female who has documented normal coronary arteries with the exception of  ostial calcification of her right coronary artery by catheterization in May 2014.  She is status post stenting of her  left subclavian artery in May 2014.  On exam, she has a soft left carotid bruit and was found to have 40-59% left internal carotid stenoses on her most recent carotid duplex study in January 2018. She had a widely patent left subclavian artery stent without evidence for restenosis.  On Thanksgiving evening 2018 she experienced an episode of upper mid epigastric to low sternal discomfort which lasted 5 minutes and seem to radiate to her jaw.  She had another episode 2 days later.  She denies clear-cut exertional precipitation of this discomfort.  She empirically started on low-dose metoprolol at 12.5 mg twice a day and initially felt very well on this treatment. An echo Doppler data and striated normal systolic function with grade 2 diastolic dysfunction.  Her nuclear perfusion study showed fairly normal perfusion with the exception of a small apical inferior defect.  There is no associated ischemia.  I suspect this may be related to possible breast diaphragmatic attenuation.  She did not have any transient ischemic dilation.  She has not had any exertional symptoms. When I last saw her, her ECG showed improvement in her previous ST-T abnormalities.  She recently developed symptoms suggestive of possible presyncope.  She experienced hot flash sensation.  When she saw her primary physician yesterday he discontinued metoprolol.  Her symptoms have resolved.  On exam today her blood pressure is low and she is not orthostatic.  In attempt to avoid beta-blocker withdrawal, I have suggested she take 6.25 mg metoprolol this evening in an attempt to create a wean rather than abrupt discontinuance of her beta-blocker regimen.  Viewed her laboratory from yesterday which showed normal renal function.  Glucose was 86.  Potassium 5.2.  She will contact me if she develops recurrent symptoms off her metoprolol.  She  denies any paresthesias.  There is no PND orthopnea.  She continues on dual antiplatelet therapy with aspirin and Plavix.  She is tolerating atorvastatin 40 mg with target LDL less than 70.  She continues to be on levothyroxine 100 mcg.  TSH yesterday was normal at 2.9.  As long as she remains stable I will see her in 6 months for reevaluation.  Time spent: 25 minutes   Troy Sine, MD, [facility]  01/16/2018 2:49 PM

## 2018-04-02 ENCOUNTER — Other Ambulatory Visit: Payer: Self-pay | Admitting: Family Medicine

## 2018-04-02 DIAGNOSIS — E038 Other specified hypothyroidism: Secondary | ICD-10-CM

## 2018-06-23 ENCOUNTER — Other Ambulatory Visit: Payer: Self-pay | Admitting: Family Medicine

## 2018-06-23 DIAGNOSIS — E785 Hyperlipidemia, unspecified: Secondary | ICD-10-CM

## 2018-09-19 ENCOUNTER — Ambulatory Visit (INDEPENDENT_AMBULATORY_CARE_PROVIDER_SITE_OTHER): Payer: BLUE CROSS/BLUE SHIELD | Admitting: Family Medicine

## 2018-09-19 DIAGNOSIS — E039 Hypothyroidism, unspecified: Secondary | ICD-10-CM

## 2018-09-19 DIAGNOSIS — E785 Hyperlipidemia, unspecified: Secondary | ICD-10-CM

## 2018-09-21 NOTE — Progress Notes (Signed)
Pt rescheduled for 12/10

## 2018-09-22 ENCOUNTER — Other Ambulatory Visit: Payer: Self-pay | Admitting: Family Medicine

## 2018-09-22 DIAGNOSIS — G458 Other transient cerebral ischemic attacks and related syndromes: Secondary | ICD-10-CM

## 2018-09-22 DIAGNOSIS — E785 Hyperlipidemia, unspecified: Secondary | ICD-10-CM

## 2018-09-24 ENCOUNTER — Ambulatory Visit (INDEPENDENT_AMBULATORY_CARE_PROVIDER_SITE_OTHER): Payer: BLUE CROSS/BLUE SHIELD | Admitting: Family Medicine

## 2018-09-24 ENCOUNTER — Encounter: Payer: Self-pay | Admitting: Family Medicine

## 2018-09-24 ENCOUNTER — Ambulatory Visit (INDEPENDENT_AMBULATORY_CARE_PROVIDER_SITE_OTHER): Payer: BLUE CROSS/BLUE SHIELD

## 2018-09-24 ENCOUNTER — Other Ambulatory Visit: Payer: Self-pay

## 2018-09-24 VITALS — BP 122/70 | HR 66 | Temp 97.6°F | Ht 65.0 in | Wt 161.4 lb

## 2018-09-24 DIAGNOSIS — R42 Dizziness and giddiness: Secondary | ICD-10-CM | POA: Diagnosis not present

## 2018-09-24 DIAGNOSIS — E785 Hyperlipidemia, unspecified: Secondary | ICD-10-CM

## 2018-09-24 DIAGNOSIS — R252 Cramp and spasm: Secondary | ICD-10-CM

## 2018-09-24 DIAGNOSIS — F418 Other specified anxiety disorders: Secondary | ICD-10-CM | POA: Diagnosis not present

## 2018-09-24 DIAGNOSIS — R05 Cough: Secondary | ICD-10-CM

## 2018-09-24 DIAGNOSIS — G458 Other transient cerebral ischemic attacks and related syndromes: Secondary | ICD-10-CM

## 2018-09-24 DIAGNOSIS — R079 Chest pain, unspecified: Secondary | ICD-10-CM

## 2018-09-24 DIAGNOSIS — E039 Hypothyroidism, unspecified: Secondary | ICD-10-CM | POA: Diagnosis not present

## 2018-09-24 DIAGNOSIS — R059 Cough, unspecified: Secondary | ICD-10-CM

## 2018-09-24 LAB — POCT CBC
Granulocyte percent: 47.2 %G (ref 37–80)
HCT, POC: 40 % (ref 29–41)
Hemoglobin: 13.7 g/dL — AB (ref 9.5–13.5)
Lymph, poc: 1.8 (ref 0.6–3.4)
MCH: 33.7 pg — AB (ref 27–31.2)
MCHC: 34.2 g/dL (ref 31.8–35.4)
MCV: 98.7 fL (ref 76–111)
MID (cbc): 0.3 (ref 0–0.9)
MPV: 6.3 fL (ref 0–99.8)
POC Granulocyte: 1.9 — AB (ref 2–6.9)
POC LYMPH PERCENT: 46.2 %L (ref 10–50)
POC MID %: 6.6 %M (ref 0–12)
Platelet Count, POC: 308 10*3/uL (ref 142–424)
RBC: 4.05 M/uL (ref 4.04–5.48)
RDW, POC: 12.8 %
WBC: 4 10*3/uL — AB (ref 4.6–10.2)

## 2018-09-24 LAB — POCT URINALYSIS DIP (MANUAL ENTRY)
Bilirubin, UA: NEGATIVE
Blood, UA: NEGATIVE
Glucose, UA: NEGATIVE mg/dL
Ketones, POC UA: NEGATIVE mg/dL
Leukocytes, UA: NEGATIVE
Nitrite, UA: NEGATIVE
Protein Ur, POC: NEGATIVE mg/dL
SPEC GRAV UA: 1.015 (ref 1.010–1.025)
Urobilinogen, UA: 0.2 E.U./dL
pH, UA: 8.5 — AB (ref 5.0–8.0)

## 2018-09-24 LAB — D-DIMER, QUANTITATIVE: D-DIMER: 0.5 mg/L FEU — ABNORMAL HIGH (ref 0.00–0.49)

## 2018-09-24 LAB — GLUCOSE, POCT (MANUAL RESULT ENTRY): POC Glucose: 87 mg/dl (ref 70–99)

## 2018-09-24 MED ORDER — CLOPIDOGREL BISULFATE 75 MG PO TABS: 75.0000 mg | ORAL_TABLET | Freq: Every day | ORAL | 1 refills | Status: DC

## 2018-09-24 MED ORDER — ATORVASTATIN CALCIUM 40 MG PO TABS: 40.0000 mg | ORAL_TABLET | Freq: Every day | ORAL | 1 refills | Status: DC

## 2018-09-24 MED ORDER — CLONAZEPAM 0.5 MG PO TABS: ORAL_TABLET | ORAL | 1 refills | Status: DC

## 2018-09-24 MED ORDER — FLUOXETINE HCL 20 MG PO TABS: 20.0000 mg | ORAL_TABLET | Freq: Every day | ORAL | 1 refills | Status: DC

## 2018-09-24 NOTE — Progress Notes (Signed)
Subjective:    Patient ID: Bethany Clarke, female    DOB: Jan 02, 1956, 62 y.o.   MRN: [id number]  HPI  Bethany Clarke is a 62 y.o. female Presents today for: Chief Complaint  Patient presents with  . Dizziness    2 months  . Nausea  . right leg pain    the back of the leg, behind the knee.   . Chest Pain    SOB for about 60 sec (possible panic attack), Sat even at 8pm   . Medication Refill    all meds (synthroid, plavix and lipator)  . Depression    PHQ9=10   Here for multiple concerns as above.  Has history of hypothyroidism, COPD, GERD, subclavian artery stenosis subclavian steal syndrome,  carotid stenosis, anxiety.    I last saw her in April with symptoms of near syncope at that time.  Metoprolol was initially held due to bradycardia.  She had a reassuring neurologic exam, CBC, and glucose in office at that time.  There was a plan for follow-up with cardiology or myself within 1 week to review these symptoms further.  She was seen by Dr. Claiborne Billings on April 3.   Based on note from Dr. Claiborne Billings she had history of normal coronary arteries except ostial calcification of right coronary artery by cardiac cath in May 2014.  Status post stenting of left subclavian artery May 2014.  She had some previous ST/T wave abnormalities that reportedly had improved on prior visit.  Per cardiology note her near syncope symptoms had improved with stopping metoprolol.  In order to avoid beta-blocker withdrawal he did recommend 6.25 mg metoprolol that evening to wean from beta-blockers.  Plan to contact cardiology if symptoms return, continued on dual antiplatelet therapy with aspirin and Plavix, statin with atorvastatin 40 mg daily, and recheck in 6 months. 43mn chart review.   Chest pain: Has occurred twice before, last one was over a year ago. Most recent episode sat night at rest (3 days ago). Watching movie around 8pm and out of the blue noticed pressure from lower front of chest - across chest and  radiated to back and right jaw. Lasted 60-90 seconds. Felt short of breath with pain- hurt to breathe. No associated diaphroesis, but did feel nauseous. Last meal about 4 hours earlier. Has been coughing past 2 weeks at night only - dry cough.  No new swelling.  Pain behind R knee for few months. Has had cramps in both legs at times but no new calf swelling. No recent car or air travel. No known hx of DVT/PE. (but brother had PE - did have cancer, both sisters have had DVT's.  Walks 2 miles everyday without CP.  S[facility] with heart issues - [facility]  appt with cardiologist in January - needs refill of Plavix. History COPD - does not want to take daily meds, see prior visits.  Only bothers if extreme hot or cold. Walking has improved breathing. Last albuterol use in July.  Quit smoking in 2014.   Dizziness:  Past 2 months.  Off balance at times - notes with fast movement or standing up quickly. No room spinning. No falls. No current hearing changes or ringing in ears.  No blood in stool. Drinking fluids, urinating normally. No focal weakness/facial droop/slurred speech. Off betablocker. No new meds. Did stop prozac about 3 weeks ago - ran out of meds, only 252mper day at that time. did not call for refill.  Nausea: Noted past month, notes if not eating for awhile. No vomiting. Eating helps nausea. No new abdominal pain. Watches diet for hx of GERD. No meds. Not having heartburn recently.   Hypothyroidism: Lab Results  Component Value Date   TSH 2.970 01/15/2018  Takes Synthroid 100 mcg daily.  No new temp intolerance, no new skin/hair changes.   Anxiety: Has taken Prozac previously, Klonopin as needed. Off Prozac as above past 3 weeks - ran out. Did increase Prozac in August due to increased stressors. Klonopin 1/2 in August, 1/2 today with family stressors (sister as above), and stress at work.  Does have therapist - new one past 4 months. Last met 2 weeks ago - monthly  visits.   Depression screen [facility] 2/9 09/24/2018 01/15/2018 01/03/2018 01/03/2018 07/03/2017  Decreased Interest 3 0 0 0 0  Down, Depressed, Hopeless 3 0 0 0 0  PHQ - 2 Score 6 0 0 0 0  Altered sleeping 3 - - - -  Tired, decreased energy 1 - - - -  Change in appetite 0 - - - -  Feeling bad or failure about yourself  0 - - - -  Trouble concentrating 0 - - - -  Moving slowly or fidgety/restless 0 - - - -  Suicidal thoughts 0 - - - -  PHQ-9 Score 10 - - - -  Difficult doing work/chores Somewhat difficult - - - -     Hyperlipidemia: Is taking Lipitor 40 mg daily.  Goal LDL less than 70 per cardiology note. No new side effects known, but some leg cramps at times. Tablespoon of peanut butter 2 and 1/2 hrs ago.  Lab Results  Component Value Date   CHOL 123 09/27/2017   HDL 67 09/27/2017   LDLCALC 46 09/27/2017   TRIG 49 09/27/2017   CHOLHDL 1.8 09/27/2017   Lab Results  Component Value Date   ALT 29 01/15/2018   AST 19 01/15/2018   ALKPHOS 69 01/15/2018   BILITOT 0.2 01/15/2018    Patient Active Problem List   Diagnosis Date Noted  . Chest pain, atypical 03/17/2016  . Paresthesia 07/14/2013  . Bilateral foot pain 05/01/2013  . Subclavian artery stenosis, left ([place])   . Subclavian steal syndrome 03/16/2013  . Subclavian arterial stenosis ([place]) 03/11/2013  . Carotid stenosis 03/11/2013  . Hyperlipidemia with target LDL less than 70 03/11/2013  . GERD (gastroesophageal reflux disease)   . Hypothyroid   . Dizziness   . Syncope, near   . Heart burn   . Hypothyroidism 05/28/2007  . COPD GOLD II with reversibility  05/28/2007   Past Medical History:  Diagnosis Date  . Atherosclerosis 11/14/2005   carotid US - mild calcific non-stenotic plaque bilaterally  . Carotid bruit 02/05/2009   Doppler - R/L ICAs 0-49% diameter reductin (velocities suggest low-mid range); L subclavian 0-49%  diameter reduction  . Chronic bronchitis ([place])    "growing up; not anymore" (03/14/2013)  . Colon  polyp    hyperplastic  . Costochondritis   . Dizziness   . Emphysema of lung ([place])   . GERD (gastroesophageal reflux disease)   . H. pylori infection   . Heart burn   . Hemorrhoids   . Hypothyroid   . Lipidemia   . Pneumonia ~ 2009  . Subclavian artery stenosis, left ([facility])    subclavian steal physiology status post PTA and stenting  . Syncope, near    Past Surgical History:  Procedure Laterality Date  . ABDOMINAL  HYSTERECTOMY  1999  . BILATERAL UPPER EXTREMITY ANGIOGRAM N/A 03/14/2013   Procedure: BILATERAL UPPER EXTREMITY ANGIOGRAM;  Surgeon: Lorretta Harp, MD;  Location: [facility] CATH LAB;  Service: Cardiovascular;  Laterality: N/A;  . BREAST BIOPSY Right 1990's   "benign" (03/14/2013)  . CARDIAC CATHETERIZATION  03/03/2013  . DILATION AND CURETTAGE OF UTERUS  1980's  . LEFT HEART CATHETERIZATION WITH CORONARY ANGIOGRAM Bilateral 03/03/2013   Procedure: LEFT HEART CATHETERIZATION WITH CORONARY ANGIOGRAM;  Surgeon: Troy Sine, MD;  Location: [facility] CATH LAB;  Service: Cardiovascular;  Laterality: Bilateral;  . PERCUTANEOUS STENT INTERVENTION  03/14/2013   Procedure: PERCUTANEOUS STENT INTERVENTION;  Surgeon: Lorretta Harp, MD;  Location: [facility] CATH LAB;  Service: Cardiovascular;;  . SUBCLAVIAN ARTERY STENT Left 03/14/2013    for subclavian steal/claudication (03/14/2013)  . TUBAL LIGATION  1980's   No Known Allergies Prior to Admission medications   Medication Sig Start Date End Date Taking? Authorizing Provider  albuterol (PROVENTIL HFA;VENTOLIN HFA) 108 (90 Base) MCG/ACT inhaler Inhale 1-2 puffs into the lungs every 4 (four) hours as needed for wheezing or shortness of breath. 03/17/16  Yes Tanda Rockers, MD  aspirin 81 MG tablet Take 81 mg by mouth daily.   Yes [provider]  atorvastatin (LIPITOR) 40 MG tablet TAKE 1 TABLET BY MOUTH EVERY DAY 09/23/18  Yes Wendie Agreste, MD  clopidogrel (PLAVIX) 75 MG tablet TAKE 1 TABLET BY MOUTH EVERY DAY 09/23/18  Yes Wendie Agreste, MD  SYNTHROID 100 MCG tablet Take 1 tablet (100 mcg total) by mouth daily before breakfast. 01/15/18  Yes Wendie Agreste, MD  clonazePAM (KLONOPIN) 0.5 MG tablet TAKE 1 TABLET BY MOUTH TWICE A DAY AS NEEDED FOR ANXIETY Patient not taking: Reported on 09/24/2018 10/08/17   Wendie Agreste, MD  FLUoxetine (PROZAC) 20 MG tablet Take 1 tablet (20 mg total) by mouth daily. Patient not taking: Reported on 09/24/2018 01/15/18   Wendie Agreste, MD   Social History   Socioeconomic History  . Marital status: Married    Spouse name: Not on file  . Number of children: 3  . Years of education: Not on file  . Highest education level: Not on file  Occupational History  . Occupation: OFFICE MANGER    Employer: [employer]  . Financial resource strain: Not on file  . Food insecurity:    Worry: Not on file    Inability: Not on file  . Transportation needs:    Medical: Not on file    Non-medical: Not on file  Tobacco Use  . Smoking status: Former Smoker    Packs/day: 0.50    Years: 40.00    Pack years: 20.00    Types: Cigarettes    Last attempt to quit: 02/11/2013    Years since quitting: 5.6  . Smokeless tobacco: Never Used  Substance and Sexual Activity  . Alcohol use: No  . Drug use: No  . Sexual activity: Yes    Comment: number of sex partners in the last 12 months  1  Lifestyle  . Physical activity:    Days per week: Not on file    Minutes per session: Not on file  . Stress: Not on file  Relationships  . Social connections:    Talks on phone: Not on file    Gets together: Not on file    Attends religious service: Not on file    Active member of club or organization: Not  on file    Attends meetings of clubs or organizations: Not on file    Relationship status: Not on file  . Intimate partner violence:    Fear of current or ex partner: Not on file    Emotionally abused: Not on file    Physically abused: Not on file    Forced sexual activity: Not on  file  Other Topics Concern  . Not on file  Social History Narrative   Daily caffeine. Married. Education: [employer]. Exercise: No.    Review of Systems      Objective:   Physical Exam  Constitutional: She is oriented to person, place, and time. She appears well-developed and well-nourished. No distress.  Appears comfortable, no current pain.   HENT:  Head: Normocephalic and atraumatic.  Eyes: Pupils are equal, round, and reactive to light. Conjunctivae and EOM are normal.  Neck: Carotid bruit is not present.  No apparent carotid bruit.   Cardiovascular: Normal rate, regular rhythm, normal heart sounds and intact distal pulses.  Pulmonary/Chest: Effort normal and breath sounds normal.  Abdominal: Soft. She exhibits no pulsatile midline mass. There is no tenderness.  Musculoskeletal:       Right shoulder: She exhibits normal range of motion.       Right lower leg: She exhibits no swelling and no edema (Negative Homans bilaterally calf circumference 37 cm equal bilaterally 15 cm below patella).       Left lower leg: She exhibits no swelling and no edema.  Neurological: She is alert and oriented to person, place, and time.  Skin: Skin is warm and dry. She is not diaphoretic.  Psychiatric: She has a normal mood and affect. Her behavior is normal.  Vitals reviewed.  Vitals:   09/24/18 1132 09/24/18 1143  BP: (!) 148/82 122/70  Pulse: 66   Temp: 97.6 F (36.4 C)   TempSrc: Oral   SpO2: 97%   Weight: 161 lb 6.4 oz (73.2 kg)   Height: 5' 5"  (1.651 m)    . Orthostatic VS for the past 24 hrs (Last 3 readings):  BP- Lying Pulse- Lying BP- Standing at 0 minutes Pulse- Standing at 0 minutes BP- Standing at 3 minutes Pulse- Standing at 3 minutes  09/24/18 1217 122/76 64 127/86 76 124/86 79    Results for orders placed or performed in visit on 09/24/18  POCT CBC  Result Value Ref Range   WBC 4.0 (A) 4.6 - 10.2 K/uL   Lymph, poc 1.8 0.6 - 3.4   POC LYMPH PERCENT 46.2 10 - 50 %L    MID (cbc) 0.3 0 - 0.9   POC MID % 6.6 0 - 12 %M   POC Granulocyte 1.9 (A) 2 - 6.9   Granulocyte percent 47.2 37 - 80 %G   RBC 4.05 4.04 - 5.48 M/uL   Hemoglobin 13.7 (A) 9.5 - 13.5 g/dL   HCT, POC 40.0 29 - 41 %   MCV 98.7 76 - 111 fL   MCH, POC 33.7 (A) 27 - 31.2 pg   MCHC 34.2 31.8 - 35.4 g/dL   RDW, POC 12.8 %   Platelet Count, POC 308 142 - 424 K/uL   MPV 6.3 0 - 99.8 fL  POCT glucose (manual entry)  Result Value Ref Range   POC Glucose 87 70 - 99 mg/dl  POCT urinalysis dipstick  Result Value Ref Range   Color, UA yellow yellow   Clarity, UA clear clear   Glucose, UA negative negative mg/dL  Bilirubin, UA negative negative   Ketones, POC UA negative negative mg/dL   Spec Grav, UA 1.015 1.010 - 1.025   Blood, UA negative negative   pH, UA 8.5 (A) 5.0 - 8.0   Protein Ur, POC negative negative mg/dL   Urobilinogen, UA 0.2 0.2 or 1.0 E.U./dL   Nitrite, UA Negative Negative   Leukocytes, UA Negative Negative   Dg Chest 2 View  Result Date: 09/24/2018 CLINICAL DATA:  Cough.  COPD. EXAM: CHEST - 2 VIEW COMPARISON:  01/03/2018. FINDINGS: And hilar structures are normal. Heart size normal. Left subclavian stent noted. Mild bibasilar atelectasis and/or scarring again noted. No acute infiltrates. No pleural effusion or pneumothorax. IMPRESSION: Mild bibasilar subsegmental atelectasis and/or scarring again noted. No acute abnormality identified. Electronically Signed   By: Marcello Moores  Register   On: 09/24/2018 13:52   EKG: Sinus rhythm, rate 67.  Nonspecific T wave inversion V1 through V3, seen on prior EKG in April.  Nonspecific T wave in V4 slightly different than upward T in April.  No apparent significant ST depression or new Q waves.  Over 40 minutes face to face care with additional time of chart review.     Assessment & Plan:    Bethany Clarke is a 62 y.o. female Chest pain, unspecified type - Plan: EKG 12-Lead, D-dimer, quantitative (not at [facility])Cough - Plan: DG Chest  2 View  -Nonexertional, few episodes.  Asymptomatic at present.  No known DVT/PE risk factors.  Possible reflux, also with cough, history of COPD.  -No acute findings apparent on EKG or chest x-ray  -D-dimer obtained, borderline at 0.50 but unlikely of concern based on age cutoffs, no apparent DVT/PE risk factors, no calf swelling, negative Homans  -Trial of omeprazole over-the-counter once per day  -If pain returned advised to proceed to emergency room or call 911  Calf cramp - Plan: CK  -As above no swelling.  Check CK, electrolytes. asymptomatic at present.  ER/RTC precautions if worsening  Depression with anxiety - Plan: FLUoxetine (PROZAC) 20 MG tablet, clonazePAM (KLONOPIN) 0.5 MG tablet  -Restart Prozac, Klonopin for as needed use only, has used very intermittently in the past.  Recheck symptoms/symptom control in 3 to 4 weeks  Hyperlipidemia, unspecified hyperlipidemia type - Plan: atorvastatin (LIPITOR) 40 MG tablet, Lipid panel, Comprehensive metabolic panel  -Labs pending, Lipitor prescribed.  CPK obtained as above for cramps but less likely statin cause.  Subclavian steal syndrome - Plan: clopidogrel (PLAVIX) 75 MG tablet.   -Plavix refill, continue follow-up with cardiology as planned  Dizziness - Plan: POCT CBC, POCT glucose (manual entry), POCT urinalysis dipstick  -Reassuring in office urinalysis, CBC, orthostatics.  -Increase fluids, check electrolytes as above, RTC/ER precautions if worsening, but follow-up in 1-2 weeks for recheck  Hypothyroidism, unspecified type - Plan: TSH  -Check TSH, continue same dose Synthroid for now.  Meds ordered this encounter  Medications  . FLUoxetine (PROZAC) 20 MG tablet    Sig: Take 1 tablet (20 mg total) by mouth daily.    Dispense:  90 tablet    Refill:  1  . clonazePAM (KLONOPIN) 0.5 MG tablet    Sig: TAKE 1 TABLET BY MOUTH TWICE A DAY AS NEEDED FOR ANXIETY    Dispense:  20 tablet    Refill:  1    This request is for a new  prescription for a controlled substance as required by Federal/State law..  . atorvastatin (LIPITOR) 40 MG tablet    Sig: Take 1 tablet (  40 mg total) by mouth daily.    Dispense:  90 tablet    Refill:  1  . clopidogrel (PLAVIX) 75 MG tablet    Sig: Take 1 tablet (75 mg total) by mouth daily.    Dispense:  90 tablet    Refill:  1   Patient Instructions     Start omeprazole once per day for cough at night and possible heartburn cause of chest symptoms.  I will check chest xray and some bloodwork, including a screening test for blood clot. If that is elevated, may need a CT scan.  If pain returns - go to the emergency room or call 911.   Restart Prozac, klonopin if needed. recheck anxiety symptoms in next 3-4 weeks.   No change in the thyroid medicines for now but I will check the labs test today  See info on dizziness but I am going to check some electrolytes,  blood counts,  other potential causes of dizziness.  Make sure to drink plenty of water and then follow-up in the next week or 2 to discuss the dizziness further. Return to the clinic or go to the nearest emergency room if any of your symptoms worsen or new symptoms occur.  Please follow up separately to discuss R knee issues. Again, be seen sooner if worsening.    Nonspecific Chest Pain Chest pain can be caused by many different conditions. There is always a chance that your pain could be related to something serious, such as a heart attack or a blood clot in your lungs. Chest pain can also be caused by conditions that are not life-threatening. If you have chest pain, it is very important to follow up with your health care provider. What are the causes? Causes of this condition include:  Heartburn.  Pneumonia or bronchitis.  Anxiety or stress.  Inflammation around your heart (pericarditis) or lung (pleuritis or pleurisy).  A blood clot in your lung.  A collapsed lung (pneumothorax). This can develop suddenly on its own  (spontaneous pneumothorax) or from trauma to the chest.  Shingles infection (varicella-zoster virus).  Heart attack.  Damage to the bones, muscles, and cartilage that make up your chest wall. This can include: ? Bruised bones due to injury. ? Strained muscles or cartilage due to frequent or repeated coughing or overwork. ? Fracture to one or more ribs. ? Sore cartilage due to inflammation (costochondritis).  What increases the risk? Risk factors for this condition may include:  Activities that increase your risk for trauma or injury to your chest.  Respiratory infections or conditions that cause frequent coughing.  Medical conditions or overeating that can cause heartburn.  Heart disease or family history of heart disease.  Conditions or health behaviors that increase your risk of developing a blood clot.  Having had chicken pox (varicella zoster).  What are the signs or symptoms? Chest pain can feel like:  Burning or tingling on the surface of your chest or deep in your chest.  Crushing, pressure, aching, or squeezing pain.  Dull or sharp pain that is worse when you move, cough, or take a deep breath.  Pain that is also felt in your back, neck, shoulder, or arm, or pain that spreads to any of these areas.  Your chest pain may come and go, or it may stay constant. How is this diagnosed? Lab tests or other studies may be needed to find the cause of your pain. Your health care provider may have you take a  test called an ECG (electrocardiogram). An ECG records your heartbeat patterns at the time the test is performed. You may also have other tests, such as:  Transthoracic echocardiogram (TTE). In this test, sound waves are used to create a picture of the heart structures and to look at how blood flows through your heart.  Transesophageal echocardiogram (TEE).This is a more advanced imaging test that takes images from inside your body. It allows your health care provider to  see your heart in finer detail.  Cardiac monitoring. This allows your health care provider to monitor your heart rate and rhythm in real time.  Holter monitor. This is a portable device that records your heartbeat and can help to diagnose abnormal heartbeats. It allows your health care provider to track your heart activity for several days, if needed.  Stress tests. These can be done through exercise or by taking medicine that makes your heart beat more quickly.  Blood tests.  Other imaging tests.  How is this treated? Treatment depends on what is causing your chest pain. Treatment may include:  Medicines. These may include: ? Acid blockers for heartburn. ? Anti-inflammatory medicine. ? Pain medicine for inflammatory conditions. ? Antibiotic medicine, if an infection is present. ? Medicines to dissolve blood clots. ? Medicines to treat coronary artery disease (CAD).  Supportive care for conditions that do not require medicines. This may include: ? Resting. ? Applying heat or cold packs to injured areas. ? Limiting activities until pain decreases.  Follow these instructions at home: Medicines  If you were prescribed an antibiotic, take it as told by your health care provider. Do not stop taking the antibiotic even if you start to feel better.  Take over-the-counter and prescription medicines only as told by your health care provider. Lifestyle  Do not use any products that contain nicotine or tobacco, such as cigarettes and e-cigarettes. If you need help quitting, ask your health care provider.  Do not drink alcohol.  Make lifestyle changes as directed by your health care provider. These may include: ? Getting regular exercise. Ask your health care provider to suggest some activities that are safe for you. ? Eating a heart-healthy diet. A registered dietitian can help you to learn healthy eating options. ? Maintaining a healthy weight. ? Managing diabetes, if  necessary. ? Reducing stress, such as with yoga or relaxation techniques. General instructions  Avoid any activities that bring on chest pain.  If heartburn is the cause for your chest pain, raise (elevate) the head of your bed about 6 inches (15 cm) by putting blocks under the legs. Sleeping with more pillows does not effectively relieve heartburn because it only changes the position of your head.  Keep all follow-up visits as told by your health care provider. This is important. This includes any further testing if your chest pain does not go away. Contact a health care provider if:  Your chest pain does not go away.  You have a rash with blisters on your chest.  You have a fever.  You have chills. Get help right away if:  Your chest pain is worse.  You have a cough that gets worse, or you cough up blood.  You have severe pain in your abdomen.  You have severe weakness.  You faint.  You have sudden, unexplained chest discomfort.  You have sudden, unexplained discomfort in your arms, back, neck, or jaw.  You have shortness of breath at any time.  You suddenly start to sweat, or  your skin gets clammy.  You feel nauseous or you vomit.  You suddenly feel light-headed or dizzy.  Your heart begins to beat quickly, or it feels like it is skipping beats. These symptoms may represent a serious problem that is an emergency. Do not wait to see if the symptoms will go away. Get medical help right away. Call your local emergency services (911 in the [place] Do not drive yourself to the hospital. This information is not intended to replace advice given to you by your health care provider. Make sure you discuss any questions you have with your health care provider. Document Released: 07/12/2005 Document Revised: 06/26/2016 Document Reviewed: 06/26/2016 Elsevier Interactive Patient Education  2017 [employer]   Dizziness Dizziness is a common problem. It is a feeling of  unsteadiness or light-headedness. You may feel like you are about to faint. Dizziness can lead to injury if you stumble or fall. Anyone can become dizzy, but dizziness is more common in older adults. This condition can be caused by a number of things, including medicines, dehydration, or illness. Follow these instructions at home: Eating and drinking  Drink enough fluid to keep your urine clear or pale yellow. This helps to keep you from becoming dehydrated. Try to drink more clear fluids, such as water.  Do not drink alcohol.  Limit your caffeine intake if told to do so by your health care provider. Check ingredients and nutrition facts to see if a food or beverage contains caffeine.  Limit your salt (sodium) intake if told to do so by your health care provider. Check ingredients and nutrition facts to see if a food or beverage contains sodium. Activity  Avoid making quick movements. ? Rise slowly from chairs and steady yourself until you feel okay. ? In the morning, first sit up on the side of the bed. When you feel okay, stand slowly while you hold onto something until you know that your balance is fine.  If you need to stand in one place for a long time, move your legs often. Tighten and relax the muscles in your legs while you are standing.  Do not drive or use heavy machinery if you feel dizzy.  Avoid bending down if you feel dizzy. Place items in your home so that they are easy for you to reach without leaning over. Lifestyle  Do not use any products that contain nicotine or tobacco, such as cigarettes and e-cigarettes. If you need help quitting, ask your health care provider.  Try to reduce your stress level by using methods such as yoga or meditation. Talk with your health care provider if you need help to manage your stress. General instructions  Watch your dizziness for any changes.  Take over-the-counter and prescription medicines only as told by your health care provider.  Talk with your health care provider if you think that your dizziness is caused by a medicine that you are taking.  Tell a friend or a family member that you are feeling dizzy. If he or she notices any changes in your behavior, have this person call your health care provider.  Keep all follow-up visits as told by your health care provider. This is important. Contact a health care provider if:  Your dizziness does not go away.  Your dizziness or light-headedness gets worse.  You feel nauseous.  You have reduced hearing.  You have new symptoms.  You are unsteady on your feet or you feel like the room is spinning. Get help  right away if:  You vomit or have diarrhea and are unable to eat or drink anything.  You have problems talking, walking, swallowing, or using your arms, hands, or legs.  You feel generally weak.  You are not thinking clearly or you have trouble forming sentences. It may take a friend or family member to notice this.  You have chest pain, abdominal pain, shortness of breath, or sweating.  Your vision changes.  You have any bleeding.  You have a severe headache.  You have neck pain or a stiff neck.  You have a fever. These symptoms may represent a serious problem that is an emergency. Do not wait to see if the symptoms will go away. Get medical help right away. Call your local emergency services (911 in the [place] Do not drive yourself to the hospital. Summary  Dizziness is a feeling of unsteadiness or light-headedness. This condition can be caused by a number of things, including medicines, dehydration, or illness.  Anyone can become dizzy, but dizziness is more common in older adults.  Drink enough fluid to keep your urine clear or pale yellow. Do not drink alcohol.  Avoid making quick movements if you feel dizzy. Monitor your dizziness for any changes. This information is not intended to replace advice given to you by your health care provider. Make  sure you discuss any questions you have with your health care provider. Document Released: 03/28/2001 Document Revised: 11/04/2016 Document Reviewed: 11/04/2016 Elsevier Interactive Patient Education  [employer].   If you have lab work done today you will be contacted with your lab results within the next 2 weeks.  If you have not heard from [place] then please contact us. The fastest way to get your results is to register for My Chart.   IF you received an x-ray today, you will receive an invoice from [facility]. Please contact [facility] Radiology at [phone] with questions or concerns regarding your invoice.   IF you received labwork today, you will receive an invoice from [place]. Please contact [facility] at [phone] with questions or concerns regarding your invoice.   Our billing staff will not be able to assist you with questions regarding bills from these companies.  You will be contacted with the lab results as soon as they are available. The fastest way to get your results is to activate your My Chart account. Instructions are located on the last page of this paperwork. If you have not heard from [place] regarding the results in 2 weeks, please contact this office.       Signed,   Merri Ray, MD [facility] [place].  09/28/18 2:45 PM

## 2018-09-24 NOTE — Patient Instructions (Addendum)
Start omeprazole once per day for cough at night and possible heartburn cause of chest symptoms.  I will check chest xray and some bloodwork, including a screening test for blood clot. If that is elevated, may need a CT scan.  If pain returns - go to the emergency room or call 911.   Restart Prozac, klonopin if needed. recheck anxiety symptoms in next 3-4 weeks.   No change in the thyroid medicines for now but I will check the labs test today  See info on dizziness but I am going to check some electrolytes,  blood counts,  other potential causes of dizziness.  Make sure to drink plenty of water and then follow-up in the next week or 2 to discuss the dizziness further. Return to the clinic or go to the nearest emergency room if any of your symptoms worsen or new symptoms occur.  Please follow up separately to discuss R knee issues. Again, be seen sooner if worsening.    Nonspecific Chest Pain Chest pain can be caused by many different conditions. There is always a chance that your pain could be related to something serious, such as a heart attack or a blood clot in your lungs. Chest pain can also be caused by conditions that are not life-threatening. If you have chest pain, it is very important to follow up with your health care provider. What are the causes? Causes of this condition include:  Heartburn.  Pneumonia or bronchitis.  Anxiety or stress.  Inflammation around your heart (pericarditis) or lung (pleuritis or pleurisy).  A blood clot in your lung.  A collapsed lung (pneumothorax). This can develop suddenly on its own (spontaneous pneumothorax) or from trauma to the chest.  Shingles infection (varicella-zoster virus).  Heart attack.  Damage to the bones, muscles, and cartilage that make up your chest wall. This can include: ? Bruised bones due to injury. ? Strained muscles or cartilage due to frequent or repeated coughing or overwork. ? Fracture to one or more  ribs. ? Sore cartilage due to inflammation (costochondritis).  What increases the risk? Risk factors for this condition may include:  Activities that increase your risk for trauma or injury to your chest.  Respiratory infections or conditions that cause frequent coughing.  Medical conditions or overeating that can cause heartburn.  Heart disease or family history of heart disease.  Conditions or health behaviors that increase your risk of developing a blood clot.  Having had chicken pox (varicella zoster).  What are the signs or symptoms? Chest pain can feel like:  Burning or tingling on the surface of your chest or deep in your chest.  Crushing, pressure, aching, or squeezing pain.  Dull or sharp pain that is worse when you move, cough, or take a deep breath.  Pain that is also felt in your back, neck, shoulder, or arm, or pain that spreads to any of these areas.  Your chest pain may come and go, or it may stay constant. How is this diagnosed? Lab tests or other studies may be needed to find the cause of your pain. Your health care provider may have you take a test called an ECG (electrocardiogram). An ECG records your heartbeat patterns at the time the test is performed. You may also have other tests, such as:  Transthoracic echocardiogram (TTE). In this test, sound waves are used to create a picture of the heart structures and to look at how blood flows through your heart.  Transesophageal echocardiogram (TEE).This  is a more advanced imaging test that takes images from inside your body. It allows your health care provider to see your heart in finer detail.  Cardiac monitoring. This allows your health care provider to monitor your heart rate and rhythm in real time.  Holter monitor. This is a portable device that records your heartbeat and can help to diagnose abnormal heartbeats. It allows your health care provider to track your heart activity for several days, if  needed.  Stress tests. These can be done through exercise or by taking medicine that makes your heart beat more quickly.  Blood tests.  Other imaging tests.  How is this treated? Treatment depends on what is causing your chest pain. Treatment may include:  Medicines. These may include: ? Acid blockers for heartburn. ? Anti-inflammatory medicine. ? Pain medicine for inflammatory conditions. ? Antibiotic medicine, if an infection is present. ? Medicines to dissolve blood clots. ? Medicines to treat coronary artery disease (CAD).  Supportive care for conditions that do not require medicines. This may include: ? Resting. ? Applying heat or cold packs to injured areas. ? Limiting activities until pain decreases.  Follow these instructions at home: Medicines  If you were prescribed an antibiotic, take it as told by your health care provider. Do not stop taking the antibiotic even if you start to feel better.  Take over-the-counter and prescription medicines only as told by your health care provider. Lifestyle  Do not use any products that contain nicotine or tobacco, such as cigarettes and e-cigarettes. If you need help quitting, ask your health care provider.  Do not drink alcohol.  Make lifestyle changes as directed by your health care provider. These may include: ? Getting regular exercise. Ask your health care provider to suggest some activities that are safe for you. ? Eating a heart-healthy diet. A registered dietitian can help you to learn healthy eating options. ? Maintaining a healthy weight. ? Managing diabetes, if necessary. ? Reducing stress, such as with yoga or relaxation techniques. General instructions  Avoid any activities that bring on chest pain.  If heartburn is the cause for your chest pain, raise (elevate) the head of your bed about 6 inches (15 cm) by putting blocks under the legs. Sleeping with more pillows does not effectively relieve heartburn because  it only changes the position of your head.  Keep all follow-up visits as told by your health care provider. This is important. This includes any further testing if your chest pain does not go away. Contact a health care provider if:  Your chest pain does not go away.  You have a rash with blisters on your chest.  You have a fever.  You have chills. Get help right away if:  Your chest pain is worse.  You have a cough that gets worse, or you cough up blood.  You have severe pain in your abdomen.  You have severe weakness.  You faint.  You have sudden, unexplained chest discomfort.  You have sudden, unexplained discomfort in your arms, back, neck, or jaw.  You have shortness of breath at any time.  You suddenly start to sweat, or your skin gets clammy.  You feel nauseous or you vomit.  You suddenly feel light-headed or dizzy.  Your heart begins to beat quickly, or it feels like it is skipping beats. These symptoms may represent a serious problem that is an emergency. Do not wait to see if the symptoms will go away. Get medical help right  away. Call your local emergency services (911 in the [place] Do not drive yourself to the hospital. This information is not intended to replace advice given to you by your health care provider. Make sure you discuss any questions you have with your health care provider. Document Released: 07/12/2005 Document Revised: 06/26/2016 Document Reviewed: 06/26/2016 Elsevier Interactive Patient Education  2017 [employer]   Dizziness Dizziness is a common problem. It is a feeling of unsteadiness or light-headedness. You may feel like you are about to faint. Dizziness can lead to injury if you stumble or fall. Anyone can become dizzy, but dizziness is more common in older adults. This condition can be caused by a number of things, including medicines, dehydration, or illness. Follow these instructions at home: Eating and drinking  Drink enough  fluid to keep your urine clear or pale yellow. This helps to keep you from becoming dehydrated. Try to drink more clear fluids, such as water.  Do not drink alcohol.  Limit your caffeine intake if told to do so by your health care provider. Check ingredients and nutrition facts to see if a food or beverage contains caffeine.  Limit your salt (sodium) intake if told to do so by your health care provider. Check ingredients and nutrition facts to see if a food or beverage contains sodium. Activity  Avoid making quick movements. ? Rise slowly from chairs and steady yourself until you feel okay. ? In the morning, first sit up on the side of the bed. When you feel okay, stand slowly while you hold onto something until you know that your balance is fine.  If you need to stand in one place for a long time, move your legs often. Tighten and relax the muscles in your legs while you are standing.  Do not drive or use heavy machinery if you feel dizzy.  Avoid bending down if you feel dizzy. Place items in your home so that they are easy for you to reach without leaning over. Lifestyle  Do not use any products that contain nicotine or tobacco, such as cigarettes and e-cigarettes. If you need help quitting, ask your health care provider.  Try to reduce your stress level by using methods such as yoga or meditation. Talk with your health care provider if you need help to manage your stress. General instructions  Watch your dizziness for any changes.  Take over-the-counter and prescription medicines only as told by your health care provider. Talk with your health care provider if you think that your dizziness is caused by a medicine that you are taking.  Tell a friend or a family member that you are feeling dizzy. If he or she notices any changes in your behavior, have this person call your health care provider.  Keep all follow-up visits as told by your health care provider. This is  important. Contact a health care provider if:  Your dizziness does not go away.  Your dizziness or light-headedness gets worse.  You feel nauseous.  You have reduced hearing.  You have new symptoms.  You are unsteady on your feet or you feel like the room is spinning. Get help right away if:  You vomit or have diarrhea and are unable to eat or drink anything.  You have problems talking, walking, swallowing, or using your arms, hands, or legs.  You feel generally weak.  You are not thinking clearly or you have trouble forming sentences. It may take a friend or family member to notice this.  You have chest pain, abdominal pain, shortness of breath, or sweating.  Your vision changes.  You have any bleeding.  You have a severe headache.  You have neck pain or a stiff neck.  You have a fever. These symptoms may represent a serious problem that is an emergency. Do not wait to see if the symptoms will go away. Get medical help right away. Call your local emergency services (911 in the [place] Do not drive yourself to the hospital. Summary  Dizziness is a feeling of unsteadiness or light-headedness. This condition can be caused by a number of things, including medicines, dehydration, or illness.  Anyone can become dizzy, but dizziness is more common in older adults.  Drink enough fluid to keep your urine clear or pale yellow. Do not drink alcohol.  Avoid making quick movements if you feel dizzy. Monitor your dizziness for any changes. This information is not intended to replace advice given to you by your health care provider. Make sure you discuss any questions you have with your health care provider. Document Released: 03/28/2001 Document Revised: 11/04/2016 Document Reviewed: 11/04/2016 Elsevier Interactive Patient Education  [employer].   If you have lab work done today you will be contacted with your lab results within the next 2 weeks.  If you have not heard  from [place] then please contact us. The fastest way to get your results is to register for My Chart.   IF you received an x-ray today, you will receive an invoice from [facility]. Please contact [facility]iology at [phone] with questions or concerns regarding your invoice.   IF you received labwork today, you will receive an invoice from [place]. Please contact [facility] at [phone] with questions or concerns regarding your invoice.   Our billing staff will not be able to assist you with questions regarding bills from these companies.  You will be contacted with the lab results as soon as they are available. The fastest way to get your results is to activate your My Chart account. Instructions are located on the last page of this paperwork. If you have not heard from [place] regarding the results in 2 weeks, please contact this office.

## 2018-09-25 LAB — COMPREHENSIVE METABOLIC PANEL
ALT: 34 IU/L — ABNORMAL HIGH (ref 0–32)
AST: 23 IU/L (ref 0–40)
Albumin/Globulin Ratio: 2.1 (ref 1.2–2.2)
Albumin: 5 g/dL — ABNORMAL HIGH (ref 3.6–4.8)
Alkaline Phosphatase: 73 IU/L (ref 39–117)
BUN/Creatinine Ratio: 13 (ref 12–28)
BUN: 9 mg/dL (ref 8–27)
Bilirubin Total: <0.2 mg/dL (ref 0.0–1.2)
CO2: 26 mmol/L (ref 20–29)
Calcium: 10.2 mg/dL (ref 8.7–10.3)
Chloride: 97 mmol/L (ref 96–106)
Creatinine, Ser: 0.69 mg/dL (ref 0.57–1.00)
GFR calc Af Amer: 108 mL/min/{1.73_m2} (ref >59)
GFR calc non Af Amer: 94 mL/min/{1.73_m2} (ref >59)
GLUCOSE: 84 mg/dL (ref 65–99)
Globulin, Total: 2.4 g/dL (ref 1.5–4.5)
POTASSIUM: 4.9 mmol/L (ref 3.5–5.2)
Sodium: 138 mmol/L (ref 134–144)
Total Protein: 7.4 g/dL (ref 6.0–8.5)

## 2018-09-25 LAB — TSH: TSH: 0.94 u[IU]/mL (ref 0.450–4.500)

## 2018-09-25 LAB — LIPID PANEL
CHOL/HDL RATIO: 1.9 ratio (ref 0.0–4.4)
Cholesterol, Total: 161 mg/dL (ref 100–199)
HDL: 84 mg/dL (ref >39)
LDL Calculated: 55 mg/dL (ref 0–99)
Triglycerides: 112 mg/dL (ref 0–149)
VLDL Cholesterol Cal: 22 mg/dL (ref 5–40)

## 2018-09-25 LAB — CK: Total CK: 122 U/L (ref 24–173)

## 2018-09-28 ENCOUNTER — Encounter: Payer: Self-pay | Admitting: Family Medicine

## 2018-09-28 MED ORDER — SYNTHROID 100 MCG PO TABS: 100.0000 ug | ORAL_TABLET | Freq: Every day | ORAL | 1 refills | Status: DC

## 2018-10-17 ENCOUNTER — Encounter: Payer: Self-pay | Admitting: Family Medicine

## 2018-10-17 ENCOUNTER — Ambulatory Visit (INDEPENDENT_AMBULATORY_CARE_PROVIDER_SITE_OTHER): Payer: BLUE CROSS/BLUE SHIELD | Admitting: Family Medicine

## 2018-10-17 VITALS — BP 133/83 | HR 63 | Temp 97.5°F | Resp 17 | Ht 65.0 in | Wt 165.0 lb

## 2018-10-17 DIAGNOSIS — M79661 Pain in right lower leg: Secondary | ICD-10-CM | POA: Diagnosis not present

## 2018-10-17 DIAGNOSIS — R42 Dizziness and giddiness: Secondary | ICD-10-CM

## 2018-10-17 DIAGNOSIS — R2689 Other abnormalities of gait and mobility: Secondary | ICD-10-CM | POA: Diagnosis not present

## 2018-10-17 NOTE — Patient Instructions (Addendum)
Increase water intake - goal of 64 ounces per day.  Try for at least 32 ounces to begin with. I will also refer you to neurology, but if increasing fluid does not help with balance or dizziness symptoms we could also look into possible neuro imaging.  Let me know how you are feeling in the next week or 2. Restart of prozac may be helpful as well and may need some more time to determine if that may help. Return to the clinic or go to the nearest emergency room if any of your symptoms worsen or new symptoms occur.  I will order an ultrasound for the right calf and popliteal fossa soreness.  If any worsening pain or new swelling proceed to the emergency room.  Thank you for coming in today.   If you have lab work done today you will be contacted with your lab results within the next 2 weeks.  If you have not heard from [place] then please contact us. The fastest way to get your results is to register for My Chart.   IF you received an x-ray today, you will receive an invoice from [facility]. Please contact [facility] at [phone] with questions or concerns regarding your invoice.   IF you received labwork today, you will receive an invoice from [place]. Please contact [facility] at [phone] with questions or concerns regarding your invoice.   Our billing staff will not be able to assist you with questions regarding bills from these companies.  You will be contacted with the lab results as soon as they are available. The fastest way to get your results is to activate your My Chart account. Instructions are located on the last page of this paperwork. If you have not heard from [place] regarding the results in 2 weeks, please contact this office.

## 2018-10-17 NOTE — Progress Notes (Signed)
Subjective:    Patient ID: Bethany Clarke, female    DOB: 08/31/1956, 63 y.o.   MRN: [id number]  HPI Bethany Clarke is a 63 y.o. female Presents today for: Chief Complaint  Patient presents with  . Dizziness  . Leg Pain    knee pain   Here for follow-up.  Was seen December 10th with multiple concerns at that visit.  Dizziness: See last office visit.  She had a reassuring in office urinalysis, CBC, EKG and orthostatics.  Did recommend that she increase her fluid intake.  CMP was overall reassuring with a borderline elevated ALT but normal renal function, sodium, potassium.  Normal TSH.  D-dimer borderline at 0.50, but normal age-adjusted cut off and no apparent DVT or PE risk factors,no  calf swelling and negative Homans.  Prozac was restarted with Klonopin as needed use only- has not used klonopin.   - has not increased fluid intake. Drinks ginger ale and 2 cups coffee per day. No other liquids.  -still notes dizziness if stands up quickly or bending over - gets dizzy and loses balance. Symptoms for 4 months. No new HA, no focal weakness, no slurred speech.   --has feeling "of all air leaves head - bottoms out" and feels like leaning to the right. Less frequent than initially but still there - 1-2 times per day. No known associated activities.   - carotid dopplers pending Jan 23rd.   R knee pain: Behind R knee.  Present for 2-3 months. Feels like tendon.  Tight feeling, some cramping noted into lower R leg at night at times.    Patient Active Problem List   Diagnosis Date Noted  . Chest pain, atypical 03/17/2016  . Paresthesia 07/14/2013  . Bilateral foot pain 05/01/2013  . Subclavian artery stenosis, left (HCC)   . Subclavian steal syndrome 03/16/2013  . Subclavian arterial stenosis (HCC) 03/11/2013  . Carotid stenosis 03/11/2013  . Hyperlipidemia with target LDL less than 70 03/11/2013  . GERD (gastroesophageal reflux disease)   . Hypothyroid   . Dizziness   .  Syncope, near   . Heart burn   . Hypothyroidism 05/28/2007  . COPD GOLD II with reversibility  05/28/2007   Past Medical History:  Diagnosis Date  . Atherosclerosis 11/14/2005   carotid US - mild calcific non-stenotic plaque bilaterally  . Carotid bruit 02/05/2009   Doppler - R/L ICAs 0-49% diameter reductin (velocities suggest low-mid range); L subclavian 0-49%  diameter reduction  . Chronic bronchitis (HCC)    "growing up; not anymore" (03/14/2013)  . Colon polyp    hyperplastic  . Costochondritis   . Dizziness   . Emphysema of lung (HCC)   . GERD (gastroesophageal reflux disease)   . H. pylori infection   . Heart burn   . Hemorrhoids   . Hypothyroid   . Lipidemia   . Pneumonia ~ 2009  . Subclavian artery stenosis, left (HCC)    subclavian steal physiology status post PTA and stenting  . Syncope, near    Past Surgical History:  Procedure Laterality Date  . ABDOMINAL HYSTERECTOMY  1999  . BILATERAL UPPER EXTREMITY ANGIOGRAM N/A 03/14/2013   Procedure: BILATERAL UPPER EXTREMITY ANGIOGRAM;  Surgeon: [name], MD;  Location: [facility] CATH LAB;  Service: Cardiovascular;  Laterality: N/A;  . BREAST BIOPSY Right 1990's   "benign" (03/14/2013)  . CARDIAC CATHETERIZATION  03/03/2013  . DILATION AND CURETTAGE OF UTERUS  1980's  . LEFT HEART CATHETERIZATION WITH CORONARY ANGIOGRAM Bilateral  03/03/2013   Procedure: LEFT HEART CATHETERIZATION WITH CORONARY ANGIOGRAM;  Surgeon: Lennette Biharihomas A Kelly, MD;  [facility];  Service: Cardiovascular;  Laterality: Bilateral;  . PERCUTANEOUS STENT INTERVENTION  03/14/2013   Procedure: PERCUTANEOUS STENT INTERVENTION;  Surgeon: Runell GessJonathan J Berry, MD;  [facility];  Service: Cardiovascular;;  . SUBCLAVIAN ARTERY STENT Left 03/14/2013    for subclavian steal/claudication (03/14/2013)  . TUBAL LIGATION  1980's   No Known Allergies Prior to Admission medications   Medication Sig Start Date End Date Taking? Authorizing Provider  albuterol  (PROVENTIL HFA;VENTOLIN HFA) 108 (90 Base) MCG/ACT inhaler Inhale 1-2 puffs into the lungs every 4 (four) hours as needed for wheezing or shortness of breath. 03/17/16  Yes Nyoka CowdenWert, Michael B, MD  aspirin 81 MG tablet Take 81 mg by mouth daily.   Yes [provider]  atorvastatin (LIPITOR) 40 MG tablet Take 1 tablet (40 mg total) by mouth daily. 09/24/18  Yes Shade FloodGreene, Yeshua Stryker R, MD  clonazePAM (KLONOPIN) 0.5 MG tablet TAKE 1 TABLET BY MOUTH TWICE A DAY AS NEEDED FOR ANXIETY 09/24/18  Yes Shade FloodGreene, Jhayla Podgorski R, MD  clopidogrel (PLAVIX) 75 MG tablet Take 1 tablet (75 mg total) by mouth daily. 09/24/18  Yes Shade FloodGreene, Noris Kulinski R, MD  FLUoxetine (PROZAC) 20 MG tablet Take 1 tablet (20 mg total) by mouth daily. 09/24/18  Yes Shade FloodGreene, Gryphon Vanderveen R, MD  SYNTHROID 100 MCG tablet Take 1 tablet (100 mcg total) by mouth daily before breakfast. 09/28/18  Yes Shade FloodGreene, Kalynne Womac R, MD   Social History   Socioeconomic History  . Marital status: Married    Spouse name: Not on file  . Number of children: 3  . Years of education: Not on file  . Highest education level: Not on file  Occupational History  . Occupation: OFFICE MANGER    Employer: [employer]  Social Needs  . Financial resource strain: Not on file  . Food insecurity:    Worry: Not on file    Inability: Not on file  . Transportation needs:    Medical: Not on file    Non-medical: Not on file  Tobacco Use  . Smoking status: Former Smoker    Packs/day: 0.50    Years: 40.00    Pack years: 20.00    Types: Cigarettes    Last attempt to quit: 02/11/2013    Years since quitting: 5.6  . Smokeless tobacco: Never Used  Substance and Sexual Activity  . Alcohol use: No  . Drug use: No  . Sexual activity: Yes    Comment: number of sex partners in the last 12 months  1  Lifestyle  . Physical activity:    Days per week: Not on file    Minutes per session: Not on file  . Stress: Not on file  Relationships  . Social connections:    Talks on phone:  Not on file    Gets together: Not on file    Attends religious service: Not on file    Active member of club or organization: Not on file    Attends meetings of clubs or organizations: Not on file    Relationship status: Not on file  . Intimate partner violence:    Fear of current or ex partner: Not on file    Emotionally abused: Not on file    Physically abused: Not on file    Forced sexual activity: Not on file  Other Topics Concern  . Not on file  Social History  Narrative   Daily caffeine. Married. Education: [employer]. Exercise: No.    Review of Systems Per HPI.     Objective:   Physical Exam Vitals signs reviewed.  Constitutional:      Appearance: She is well-developed.  HENT:     Head: Normocephalic and atraumatic.  Eyes:     General: Lids are normal.     Extraocular Movements: Extraocular movements intact.     Right eye: Normal extraocular motion and no nystagmus.     Left eye: Normal extraocular motion and no nystagmus.     Conjunctiva/sclera: Conjunctivae normal.     Pupils: Pupils are equal, round, and reactive to light.  Neck:     Vascular: No carotid bruit.  Cardiovascular:     Rate and Rhythm: Normal rate and regular rhythm.     Heart sounds: Normal heart sounds.  Pulmonary:     Effort: Pulmonary effort is normal.     Breath sounds: Normal breath sounds.  Abdominal:     Palpations: Abdomen is soft. There is no pulsatile mass.     Tenderness: There is no abdominal tenderness.  Skin:    General: Skin is warm and dry.  Neurological:     General: No focal deficit present.     Mental Status: She is alert and oriented to person, place, and time.     Motor: No weakness.     Coordination: Coordination normal.     Gait: Gait normal.     Comments: No pronator drift.  Nonfocal exam.  Negative Romberg.  Psychiatric:        Behavior: Behavior normal.    Vitals:   10/17/18 1508  BP: 133/83  Pulse: 63  Resp: 17  Temp: (!) 97.5 F (36.4 C)  TempSrc:  Oral  SpO2: 98%  Weight: 165 lb (74.8 kg)  Height: 5\' 5"  (1.651 m)     Assessment & Plan:  BRYTNEY SOMES is a 63 y.o. female Dizziness - Plan: Ambulatory referral to Neurology Balance disorder - Plan: Ambulatory referral to Neurology  -Nonfocal neurologic exam.  Again stressed importance of hydration as that may be partly to blame.  No med changes at this time.  Will refer to neurology, but advised to update with symptoms in next week or 2 to decide on neuroimaging at that time.  ER/RTC precautions if acute worsening.  Right calf pain - Plan: VAS [place] LOWER EXTREMITY VENOUS (DVT)  -No swelling appreciated nor known risk factors for DVT, but now with pain more at upper calf to the popliteal fossa will check ultrasound.  Previous d-dimer borderline but normal based on age-adjusted cut off.  ER precautions if acute worsening or chest pain/dyspnea  No orders of the defined types were placed in this encounter.  Patient Instructions   Increase water intake - goal of 64 ounces per day.  Try for at least 32 ounces to begin with. I will also refer you to neurology, but if increasing fluid does not help with balance or dizziness symptoms we could also look into possible neuro imaging.  Let me know how you are feeling in the next week or 2. Restart of prozac may be helpful as well and may need some more time to determine if that may help. Return to the clinic or go to the nearest emergency room if any of your symptoms worsen or new symptoms occur.  I will order an ultrasound for the right calf and popliteal fossa soreness.  If any worsening pain  or new swelling proceed to the emergency room.  Thank you for coming in today.   If you have lab work done today you will be contacted with your lab results within the next 2 weeks.  If you have not heard from us then please contact us. The fastest way to get your results is to register for My Chart.   IF you received an x-ray today, you will receive an  invoice from [facility]. Please contact [facility] at [phone] with questions or concerns regarding your invoice.   IF you received labwork today, you will receive an invoice from [facility]. Please contact [facility] at [phone] with questions or concerns regarding your invoice.   Our billing staff will not be able to assist you with questions regarding bills from these companies.  You will be contacted with the lab results as soon as they are available. The fastest way to get your results is to activate your My Chart account. Instructions are located on the last page of this paperwork. If you have not heard from us regarding the results in 2 weeks, please contact this office.       Signed,   Meredith StaggersJeffrey Torion Hulgan, MD Primary [facility] at [facility].  10/19/18 3:25 PM

## 2018-10-18 ENCOUNTER — Encounter: Payer: Self-pay | Admitting: Neurology

## 2018-10-19 ENCOUNTER — Encounter: Payer: Self-pay | Admitting: Family Medicine

## 2018-10-22 ENCOUNTER — Ambulatory Visit (HOSPITAL_COMMUNITY)
Admission: RE | Admit: 2018-10-22 | Discharge: 2018-10-22 | Disposition: A | Discharge status: Home / Self Care | Payer: BLUE CROSS/BLUE SHIELD | Source: Ambulatory Visit | Attending: Family Medicine | Admitting: Family Medicine

## 2018-10-22 DIAGNOSIS — M79661 Pain in right lower leg: Secondary | ICD-10-CM | POA: Diagnosis not present

## 2018-10-22 NOTE — Progress Notes (Signed)
Right lower extremity venous duplex exam completed. Result notified ordering physician'f office.  Please see preliminary notes on CV PROC under chart review. Egidio Lofgren H Curtina Grills(RDMS RVT) 10/22/18 1:22 PM

## 2018-11-07 ENCOUNTER — Ambulatory Visit (HOSPITAL_COMMUNITY)
Admission: RE | Admit: 2018-11-07 | Discharge: 2018-11-07 | Disposition: A | Discharge status: Home / Self Care | Payer: BLUE CROSS/BLUE SHIELD | Source: Ambulatory Visit | Attending: Cardiovascular Disease | Admitting: Cardiovascular Disease

## 2018-11-07 DIAGNOSIS — I771 Stricture of artery: Secondary | ICD-10-CM | POA: Diagnosis not present

## 2018-11-07 DIAGNOSIS — I6523 Occlusion and stenosis of bilateral carotid arteries: Secondary | ICD-10-CM

## 2018-11-12 ENCOUNTER — Other Ambulatory Visit: Payer: Self-pay | Admitting: *Deleted

## 2018-11-12 DIAGNOSIS — I771 Stricture of artery: Secondary | ICD-10-CM

## 2018-11-12 DIAGNOSIS — I6523 Occlusion and stenosis of bilateral carotid arteries: Secondary | ICD-10-CM

## 2018-12-16 NOTE — Progress Notes (Signed)
NEUROLOGY CONSULTATION NOTE  JAMICIA HSIEH MRN: [id number] DOB: 1956-06-04  Referring provider: Meredith Staggers, MD Primary care provider: Meredith Staggers, MD  Reason for consult:  dizziness  HISTORY OF PRESENT ILLNESS: Bethany Clarke is a 63 year old right- handed Caucasian woman with hypothyroidism, emphysema, and history of left subclavian steal syndrome status post PTA and stenting who presents for dizziness.  History supplemented by referring provider note.  She began having dizzy spells about 6 months ago that has progressively become more frequent.  She describes a numbness in the back of her head that radiates up to the front associated with nausea, near syncope, sensation of being in tunnel vision, left sided heavy sensation of arm and leg and loss of It lasts up to 5-10 seconds.  She has never passed out or fallen.  It occurs at least once or twice a day.  Bending over or turning too fast may trigger it but it is also spontaneous.  She needs to stop whatever she is doing to hold on to the wall or sit down.  There is no associated visual disturbance, tinnitus, headache, spinning sensation, feeling hot or diaphoresis.  She had similar episodes over 20 years ago when she was diagnosed with hypothyroidism.  However, it is now well-controlled.  She has history of carotid artery disease and left subclavian steal syndrome.  Carotid doppler from 11/07/18 showed 1-39% bilateral ICA stenosis and antegrade flow in vertebral arteries.  She reports 3 severe headaches many years ago.  09/24/2018 CMP: NA 138, K4.9, CL 97, CO2 26, glucose 84, BUN 9, CR 0.69, T bili <0.2, ALP 73, AST 23, ALT 34.  PAST MEDICAL HISTORY: Past Medical History:  Diagnosis Date  . Atherosclerosis 11/14/2005   carotid US - mild calcific non-stenotic plaque bilaterally  . Carotid bruit 02/05/2009   Doppler - R/L ICAs 0-49% diameter reductin (velocities suggest low-mid range); L subclavian 0-49%  diameter reduction  .  Chronic bronchitis (HCC)    "growing up; not anymore" (03/14/2013)  . Colon polyp    hyperplastic  . Costochondritis   . Dizziness   . Emphysema of lung (HCC)   . GERD (gastroesophageal reflux disease)   . H. pylori infection   . Heart burn   . Hemorrhoids   . Hypothyroid   . Lipidemia   . Pneumonia ~ 2009  . Subclavian artery stenosis, left (HCC)    subclavian steal physiology status post PTA and stenting  . Syncope, near     PAST SURGICAL HISTORY: Past Surgical History:  Procedure Laterality Date  . ABDOMINAL HYSTERECTOMY  1999  . BILATERAL UPPER EXTREMITY ANGIOGRAM N/A 03/14/2013   Procedure: BILATERAL UPPER EXTREMITY ANGIOGRAM;  Surgeon: [name], MD;  Location: [facility] CATH LAB;  Service: Cardiovascular;  Laterality: N/A;  . BREAST BIOPSY Right 1990's   "benign" (03/14/2013)  . CARDIAC CATHETERIZATION  03/03/2013  . DILATION AND CURETTAGE OF UTERUS  1980's  . LEFT HEART CATHETERIZATION WITH CORONARY ANGIOGRAM Bilateral 03/03/2013   Procedure: LEFT HEART CATHETERIZATION WITH CORONARY ANGIOGRAM;  Surgeon: Lennette Bihari, MD;  Location: [facility] CATH LAB;  Service: Cardiovascular;  Laterality: Bilateral;  . PERCUTANEOUS STENT INTERVENTION  03/14/2013   Procedure: PERCUTANEOUS STENT INTERVENTION;  Surgeon: [name], MD;  Location: [facility] CATH LAB;  Service: Cardiovascular;;  . SUBCLAVIAN ARTERY STENT Left 03/14/2013    for subclavian steal/claudication (03/14/2013)  . TUBAL LIGATION  1980's    MEDICATIONS: Current Outpatient Medications on File Prior to Visit  Medication Sig Dispense Refill  .  albuterol (PROVENTIL HFA;VENTOLIN HFA) 108 (90 Base) MCG/ACT inhaler Inhale 1-2 puffs into the lungs every 4 (four) hours as needed for wheezing or shortness of breath. 1 Inhaler 2  . aspirin 81 MG tablet Take 81 mg by mouth daily.    Marland Kitchen atorvastatin (LIPITOR) 40 MG tablet Take 1 tablet (40 mg total) by mouth daily. 90 tablet 1  . clonazePAM (KLONOPIN) 0.5 MG tablet TAKE 1 TABLET BY MOUTH  TWICE A DAY AS NEEDED FOR ANXIETY 20 tablet 1  . clopidogrel (PLAVIX) 75 MG tablet Take 1 tablet (75 mg total) by mouth daily. 90 tablet 1  . FLUoxetine (PROZAC) 20 MG tablet Take 1 tablet (20 mg total) by mouth daily. 90 tablet 1  . SYNTHROID 100 MCG tablet Take 1 tablet (100 mcg total) by mouth daily before breakfast. 90 tablet 1   Current Facility-Administered Medications on File Prior to Visit  Medication Dose Route Frequency Provider Last Rate Last Dose  . albuterol (PROVENTIL) (2.5 MG/3ML) 0.083% nebulizer solution 2.5 mg  2.5 mg Nebulization Once [name], MD      . ipratropium (ATROVENT) nebulizer solution 0.5 mg  0.5 mg Nebulization Once [name], MD        ALLERGIES: No Known Allergies  FAMILY HISTORY: Family History  Problem Relation Age of Onset  . Cancer Brother        spine/lung  . COPD Brother        smoked  . Hypertension Brother   . Rheum arthritis Brother   . Hyperlipidemia Brother   . Hypertension Brother   . Diabetes Mother   . Heart disease Mother   . Stroke Mother   . Cancer Father        cirrohosis  . Hypertension Father   . Heart disease Sister   . Diabetes Sister   . Hyperlipidemia Sister   . Hypertension Sister   . COPD Sister   . Arthritis Sister        rheumatoid  . Diabetes Sister   . Arthritis Sister        rheumatoid  . Colon cancer Neg Hx    SOCIAL HISTORY: Social History   Socioeconomic History  . Marital status: Married    Spouse name: Not on file  . Number of children: 3  . Years of education: Not on file  . Highest education level: Not on file  Occupational History  . Occupation: OFFICE MANGER    Employer: [employer]  Social Needs  . Financial resource strain: Not on file  . Food insecurity:    Worry: Not on file    Inability: Not on file  . Transportation needs:    Medical: Not on file    Non-medical: Not on file  Tobacco Use  . Smoking status: Former Smoker    Packs/day: 0.50    Years:  40.00    Pack years: 20.00    Types: Cigarettes    Last attempt to quit: 02/11/2013    Years since quitting: 5.8  . Smokeless tobacco: Never Used  Substance and Sexual Activity  . Alcohol use: No  . Drug use: No  . Sexual activity: Yes    Comment: number of sex partners in the last 12 months  1  Lifestyle  . Physical activity:    Days per week: Not on file    Minutes per session: Not on file  . Stress: Not on file  Relationships  . Social connections:    Talks  on phone: Not on file    Gets together: Not on file    Attends religious service: Not on file    Active member of club or organization: Not on file    Attends meetings of clubs or organizations: Not on file    Relationship status: Not on file  . Intimate partner violence:    Fear of current or ex partner: Not on file    Emotionally abused: Not on file    Physically abused: Not on file    Forced sexual activity: Not on file  Other Topics Concern  . Not on file  Social History Narrative   Daily caffeine. Married. Education: [employer]. Exercise: No.    REVIEW OF SYSTEMS: Constitutional: No fevers, chills, or sweats, no generalized fatigue, change in appetite Eyes: No visual changes, double vision, eye pain Ear, nose and throat: No hearing loss, ear pain, nasal congestion, sore throat Cardiovascular: No chest pain, palpitations Respiratory:  No shortness of breath at rest or with exertion, wheezes GastrointestinaI: No nausea, vomiting, diarrhea, abdominal pain, fecal incontinence Genitourinary:  No dysuria, urinary retention or frequency Musculoskeletal:  No neck pain, back pain Integumentary: No rash, pruritus, skin lesions Neurological: as above Psychiatric: No depression, insomnia, anxiety Endocrine: No palpitations, fatigue, diaphoresis, mood swings, change in appetite, change in weight, increased thirst Hematologic/Lymphatic:  No purpura, petechiae. Allergic/Immunologic: no itchy/runny eyes, nasal congestion,  recent allergic reactions, rashes  PHYSICAL EXAM: Blood pressure 128/72, pulse 71, height  (1.651 m), weight 166 lb (75.3 kg), SpO2 97 %. General: No acute distress.  Patient appears well-groomed.   Head:  Normocephalic/atraumatic Eyes:  fundi examined but not visualized Neck: supple, no paraspinal tenderness, full range of motion Back: No paraspinal tenderness Heart: regular rate and rhythm Lungs: Clear to auscultation bilaterally. Vascular: No carotid bruits. Neurological Exam: Mental status: alert and oriented to person, place, and time, recent and remote memory intact, fund of knowledge intact, attention and concentration intact, speech fluent and not dysarthric, language intact. Cranial nerves: CN I: not tested CN II: pupils equal, round and reactive to light, visual fields intact CN III, IV, VI:  full range of motion, no nystagmus, no ptosis CN V: facial sensation intact CN VII: upper and lower face symmetric CN VIII: hearing intact CN IX, X: gag intact, uvula midline CN XI: sternocleidomastoid and trapezius muscles intact CN XII: tongue midline Bulk & Tone: normal, no fasciculations. Motor:  5/5 throughout  Sensation:  temperature and vibration sensation intact. Deep Tendon Reflexes:  2+ throughout, toes downgoing.   Finger to nose testing:  Without dysmetria.   Heel to shin:  Without dysmetria.   Gait:  Normal station and stride.  Able to turn and tandem walk. Romberg negative.  IMPRESSION: 1.  Recurrent episodic dizziness.  She describes a left sided heavy sensation but not clear weakness.  Not consistent with vertigo.  Given the brief duration of events, not likely migraine.  Seizure not suspected.  Given her history of carotid artery disease, we will check for any intracranial vascular abnormality.  PLAN: MRI/MRA of head Further recommendations pending results.  Thank you for allowing me to take part in the care of this patient.  Shon Millet, DO  CC: Meredith Staggers, MD

## 2018-12-17 ENCOUNTER — Encounter: Payer: Self-pay | Admitting: Neurology

## 2018-12-17 ENCOUNTER — Ambulatory Visit (INDEPENDENT_AMBULATORY_CARE_PROVIDER_SITE_OTHER): Payer: BLUE CROSS/BLUE SHIELD | Admitting: Neurology

## 2018-12-17 VITALS — BP 128/72 | HR 71 | Ht 65.0 in | Wt 166.0 lb

## 2018-12-17 DIAGNOSIS — R531 Weakness: Secondary | ICD-10-CM

## 2018-12-17 DIAGNOSIS — I739 Peripheral vascular disease, unspecified: Secondary | ICD-10-CM

## 2018-12-17 DIAGNOSIS — R42 Dizziness and giddiness: Secondary | ICD-10-CM | POA: Diagnosis not present

## 2018-12-17 DIAGNOSIS — I779 Disorder of arteries and arterioles, unspecified: Secondary | ICD-10-CM

## 2018-12-17 NOTE — Patient Instructions (Addendum)
Check MRI and MRA of head Further recommendations pending results.  We have sent a referral to [facility] for your MRI/MRA and they will call you directly to schedule your appt. They are located at [address] [facility]. If you need to contact them directly please call [phone].

## 2018-12-30 ENCOUNTER — Telehealth: Payer: Self-pay | Admitting: Physician Assistant

## 2018-12-30 ENCOUNTER — Other Ambulatory Visit: Payer: Self-pay | Admitting: Physician Assistant

## 2018-12-30 MED ORDER — VALACYCLOVIR HCL 1 G PO TABS: 2000.0000 mg | ORAL_TABLET | Freq: Two times a day (BID) | ORAL | 12 refills | Status: AC

## 2018-12-30 NOTE — Telephone Encounter (Signed)
  [facility]   MULTIDISCIPLINARY HEART VALVE TEAM  Patient called in with severe fever blisters. She has a long history of these. Rx for Valtrex sent into her pharmacy.   Cline Crock PA-C  [facility]

## 2019-01-01 ENCOUNTER — Ambulatory Visit: Payer: Self-pay | Admitting: *Deleted

## 2019-01-01 NOTE — Telephone Encounter (Signed)
Summary: advice regarding COVID-19    Pt calling and states that husband was tested yesterday for COVID-19. She is now starting to have symptoms as well (cough, congestion). She is seeking advice of what they both can take to help with symptoms OTC. Will have a family or friend bring the supplies and leave on porch. Both her and her husband are on self quarantine at home. She states she does have COPD and is fearful for if the congestion gets worse.      Patient's husband has been tested for COVID-19 virus and they are awaiting results.They have been isolating at home and from each other as much as they can. Both patient's are sick. Patient is reporting worse cough and congestion- no fever. Patient has been- separating herself from husband- she is afraid of developing bronchitis and pneumonia.   Reason for Disposition . SEVERE coughing spells (e.g., whooping sound after coughing, vomiting after coughing)  Answer Assessment - Initial Assessment Questions 1. ONSET: "When did the cough begin?"      1 week ago 2. SEVERITY: "How bad is the cough today?"      Worse when laying down- getting worse during the day 3. RESPIRATORY DISTRESS: "Describe your breathing."      Patient is getting SOB sooner now- exertion makes her slow down 4. FEVER: "Do you have a fever?" If so, ask: "What is your temperature, how was it measured, and when did it start?"     No fever 5. HEMOPTYSIS: "Are you coughing up any blood?" If so ask: "How much?" (flecks, streaks, tablespoons, etc.)     No  6. TREATMENT: "What have you done so far to treat the cough?" (e.g., meds, fluids, humidifier)     Warm fluids, zycam 7. CARDIAC HISTORY: "Do you have any history of heart disease?" (e.g., heart attack, congestive heart failure)      yes 8. LUNG HISTORY: "Do you have any history of lung disease?"  (e.g., pulmonary embolus, asthma, emphysema)     COPD 9. PE RISK FACTORS: "Do you have a history of blood clots?" (or: recent major  surgery, recent prolonged travel, bedridden)     Stint history 10. OTHER SYMPTOMS: "Do you have any other symptoms? (e.g., runny nose, wheezing, chest pain)       No- patient does have sharp pain in back every now and then 11. PREGNANCY: "Is there any chance you are pregnant?" "When was your last menstrual period?"       n/a 12. TRAVEL: "Have you traveled out of the country in the last month?" (e.g., travel history, exposures)       No- patient's husband is currently under testing.  Protocols used: COUGH - ACUTE NON-PRODUCTIVE-A-AH

## 2019-01-01 NOTE — Telephone Encounter (Signed)
Call to pt to clarify.  Caller from [facility] stated pt's husband was tested for COVID Saturday but message/chart states yesterday.  Pt states pt was tested yesterday and she is not requesting testing- she would like to be evaluated for pneumonia.  Advised pt that per CDC/Health Dept criteria, pt will likely not be tested for COVID. Pt agreeable.

## 2019-01-02 ENCOUNTER — Encounter: Payer: Self-pay | Admitting: Family Medicine

## 2019-01-02 ENCOUNTER — Other Ambulatory Visit: Payer: Self-pay

## 2019-01-02 ENCOUNTER — Ambulatory Visit (INDEPENDENT_AMBULATORY_CARE_PROVIDER_SITE_OTHER): Payer: BLUE CROSS/BLUE SHIELD | Admitting: Family Medicine

## 2019-01-02 ENCOUNTER — Ambulatory Visit (INDEPENDENT_AMBULATORY_CARE_PROVIDER_SITE_OTHER): Payer: BLUE CROSS/BLUE SHIELD

## 2019-01-02 VITALS — BP 122/70 | HR 105 | Temp 98.3°F | Resp 16 | Ht 65.0 in | Wt 167.0 lb

## 2019-01-02 DIAGNOSIS — J441 Chronic obstructive pulmonary disease with (acute) exacerbation: Secondary | ICD-10-CM

## 2019-01-02 DIAGNOSIS — R0602 Shortness of breath: Secondary | ICD-10-CM | POA: Diagnosis not present

## 2019-01-02 DIAGNOSIS — R05 Cough: Secondary | ICD-10-CM | POA: Diagnosis not present

## 2019-01-02 DIAGNOSIS — R059 Cough, unspecified: Secondary | ICD-10-CM

## 2019-01-02 LAB — POCT CBC
Granulocyte percent: 71 %G (ref 37–80)
HCT, POC: 40.1 % (ref 29–41)
Hemoglobin: 13.8 g/dL (ref 11–14.6)
Lymph, poc: 1.1 (ref 0.6–3.4)
MCH, POC: 33 pg — AB (ref 27–31.2)
MCHC: 34.4 g/dL (ref 31.8–35.4)
MCV: 95.9 fL (ref 76–111)
MID (cbc): 0.3 (ref 0–0.9)
MPV: 6.2 fL (ref 0–99.8)
POC Granulocyte: 3.3 (ref 2–6.9)
POC LYMPH PERCENT: 22.9 %L (ref 10–50)
POC MID %: 6.1 %M (ref 0–12)
Platelet Count, POC: 274 10*3/uL (ref 142–424)
RBC: 4.19 M/uL (ref 4.04–5.48)
RDW, POC: 12 %
WBC: 4.7 10*3/uL (ref 4.6–10.2)

## 2019-01-02 LAB — POCT INFLUENZA A/B
Influenza A, POC: NEGATIVE
Influenza B, POC: NEGATIVE

## 2019-01-02 MED ORDER — IPRATROPIUM-ALBUTEROL 0.5-2.5 (3) MG/3ML IN SOLN
3.0000 mL | Freq: Once | RESPIRATORY_TRACT | Status: AC
  Administered 2019-01-02: 3 mL via RESPIRATORY_TRACT

## 2019-01-02 MED ORDER — PREDNISONE 20 MG PO TABS: 40.0000 mg | ORAL_TABLET | Freq: Every day | ORAL | 0 refills | Status: DC

## 2019-01-02 MED ORDER — AZITHROMYCIN 250 MG PO TABS: ORAL_TABLET | ORAL | 0 refills | Status: DC

## 2019-01-02 MED ORDER — BENZONATATE 100 MG PO CAPS: 100.0000 mg | ORAL_CAPSULE | Freq: Three times a day (TID) | ORAL | 0 refills | Status: DC | PRN

## 2019-01-02 NOTE — Patient Instructions (Addendum)
Current symptoms likely started as a virus, with flare of COPD.  Lungs are improved after nebulizer treatment here today.  Flu testing, blood counts, and chest x-ray were reassuring.  Can start prednisone and azithromycin for current COPD flare.  I still recommend avoiding others until other testing results have been known with close contact.  Tessalon Perles if needed for cough, but use albuterol up to every 4 hours if needed for now.  Expect the need for albuterol to improve within the next day or 2 at the most.  Return to the clinic or go to the nearest emergency room if any of your symptoms worsen or new symptoms occur.   Cough, Adult  Coughing is a reflex that clears your throat and your airways. Coughing helps to heal and protect your lungs. It is normal to cough occasionally, but a cough that happens with other symptoms or lasts a long time may be a sign of a condition that needs treatment. A cough may last only 2-3 weeks (acute), or it may last longer than 8 weeks (chronic). What are the causes? Coughing is commonly caused by:  Breathing in substances that irritate your lungs.  A viral or bacterial respiratory infection.  Allergies.  Asthma.  Postnasal drip.  Smoking.  Acid backing up from the stomach into the esophagus (gastroesophageal reflux).  Certain medicines.  Chronic lung problems, including COPD (or rarely, lung cancer).  Other medical conditions such as heart failure. Follow these instructions at home: Pay attention to any changes in your symptoms. Take these actions to help with your discomfort:  Take medicines only as told by your health care provider. ? If you were prescribed an antibiotic medicine, take it as told by your health care provider. Do not stop taking the antibiotic even if you start to feel better. ? Talk with your health care provider before you take a cough suppressant medicine.  Drink enough fluid to keep your urine clear or pale yellow.  If  the air is dry, use a cold steam vaporizer or humidifier in your bedroom or your home to help loosen secretions.  Avoid anything that causes you to cough at work or at home.  If your cough is worse at night, try sleeping in a semi-upright position.  Avoid cigarette smoke. If you smoke, quit smoking. If you need help quitting, ask your health care provider.  Avoid caffeine.  Avoid alcohol.  Rest as needed. Contact a health care provider if:  You have new symptoms.  You cough up pus.  Your cough does not get better after 2-3 weeks, or your cough gets worse.  You cannot control your cough with suppressant medicines and you are losing sleep.  You develop pain that is getting worse or pain that is not controlled with pain medicines.  You have a fever.  You have unexplained weight loss.  You have night sweats. Get help right away if:  You cough up blood.  You have difficulty breathing.  Your heartbeat is very fast. This information is not intended to replace advice given to you by your health care provider. Make sure you discuss any questions you have with your health care provider. Document Released: 03/31/2011 Document Revised: 03/09/2016 Document Reviewed: 12/09/2014 Elsevier Interactive Patient Education  [employer].   If you have lab work done today you will be contacted with your lab results within the next 2 weeks.  If you have not heard from [place] then please contact us. The fastest way  to get your results is to register for My Chart.   IF you received an x-ray today, you will receive an invoice from [facility]. Please contact [facility] at [phone] with questions or concerns regarding your invoice.   IF you received labwork today, you will receive an invoice from [facility]. Please contact [facility] at [phone] with questions or concerns regarding your invoice.   Our billing staff will not be able to assist you with questions regarding  bills from these companies.  You will be contacted with the lab results as soon as they are available. The fastest way to get your results is to activate your My Chart account. Instructions are located on the last page of this paperwork. If you have not heard from [place] regarding the results in 2 weeks, please contact this office.

## 2019-01-02 NOTE — Progress Notes (Signed)
Subjective:    Patient ID: Bethany Clarke, female    DOB: 1955/10/19, 63 y.o.   MRN: [id number]  HPI Bethany Clarke is a 63 y.o. female Presents today for: Chief Complaint  Patient presents with  . Cough    chronic with chest congestion,tightness,and SHOB x 1 week   . Nasal Congestion   Presents with cough, congestion, dyspnea past 1 week.  History of COPD.  See prior notes, symptoms only bother her durin heat or cold times.  Has declined daily medications in the past.  Walking had improved her breathing last visit. Quit smoking in 2014.   Current symptoms started a little over a week ago.  Initially slight cough and sore throat.  No myalgias, no fevers.  Slight headache but similar in past with coughing.  Symptoms seem to worsen the past few days with increased coughing and wheezing.  Feels similar to previous COPD flares and similar with previous pneumonia.  Still no fever.  No known sick contact s prior to illness, but husband was recently tested through drive-through testing at [facility] for Covid 19,  results not yet known.  His potential risk factor was a meeting in [place] prior to becoming sick.  She had been on self quarantine at home as well.. No recent travel in past 2 weeks,  no known exposure to documented coronavirus positive person.  Treatment with albuterol, most recently 1 hour prior to office visit.    Patient Active Problem List   Diagnosis Date Noted  . Chest pain, atypical 03/17/2016  . Paresthesia 07/14/2013  . Bilateral foot pain 05/01/2013  . Subclavian artery stenosis, left (HCC)   . Subclavian steal syndrome 03/16/2013  . Subclavian arterial stenosis (HCC) 03/11/2013  . Carotid stenosis 03/11/2013  . Hyperlipidemia with target LDL less than 70 03/11/2013  . GERD (gastroesophageal reflux disease)   . Hypothyroid   . Dizziness   . Syncope, near   . Heart burn   . Hypothyroidism 05/28/2007  . COPD GOLD II with reversibility  05/28/2007   Past  Medical History:  Diagnosis Date  . Atherosclerosis 11/14/2005   carotid US - mild calcific non-stenotic plaque bilaterally  . Carotid bruit 02/05/2009   Doppler - R/L ICAs 0-49% diameter reductin (velocities suggest low-mid range); L subclavian 0-49%  diameter reduction  . Chronic bronchitis (HCC)    "growing up; not anymore" (03/14/2013)  . Colon polyp    hyperplastic  . Costochondritis   . Dizziness   . Emphysema of lung (HCC)   . GERD (gastroesophageal reflux disease)   . H. pylori infection   . Heart burn   . Hemorrhoids   . Hypothyroid   . Lipidemia   . Pneumonia ~ 2009  . Subclavian artery stenosis, left (HCC)    subclavian steal physiology status post PTA and stenting  . Syncope, near    Past Surgical History:  Procedure Laterality Date  . ABDOMINAL HYSTERECTOMY  1999  . BILATERAL UPPER EXTREMITY ANGIOGRAM N/A 03/14/2013   Procedure: BILATERAL UPPER EXTREMITY ANGIOGRAM;  Surgeon: [name], MD;  Location: [facility] CATH LAB;  Service: Cardiovascular;  Laterality: N/A;  . BREAST BIOPSY Right 1990's   "benign" (03/14/2013)  . CARDIAC CATHETERIZATION  03/03/2013  . DILATION AND CURETTAGE OF UTERUS  1980's  . LEFT HEART CATHETERIZATION WITH CORONARY ANGIOGRAM Bilateral 03/03/2013   Procedure: LEFT HEART CATHETERIZATION WITH CORONARY ANGIOGRAM;  Surgeon: Lennette Bihari, MD;  Location: [facility] CATH LAB;  Service: Cardiovascular;  Laterality:  Bilateral;  . PERCUTANEOUS STENT INTERVENTION  03/14/2013   Procedure: PERCUTANEOUS STENT INTERVENTION;  Surgeon: [name], MD;  Location: [facility] CATH LAB;  Service: Cardiovascular;;  . SUBCLAVIAN ARTERY STENT Left 03/14/2013    for subclavian steal/claudication (03/14/2013)  . TUBAL LIGATION  1980's   No Known Allergies Prior to Admission medications   Medication Sig Start Date End Date Taking? Authorizing Provider  aspirin 81 MG tablet Take 81 mg by mouth daily.   Yes [provider]  atorvastatin (LIPITOR) 40 MG tablet Take 1  tablet (40 mg total) by mouth daily. 09/24/18  Yes [name], MD  clopidogrel (PLAVIX) 75 MG tablet Take 1 tablet (75 mg total) by mouth daily. 09/24/18  Yes [name], MD  FLUoxetine (PROZAC) 20 MG tablet Take 1 tablet (20 mg total) by mouth daily. 09/24/18  Yes [name], MD  SYNTHROID 100 MCG tablet Take 1 tablet (100 mcg total) by mouth daily before breakfast. 09/28/18  Yes [name], MD   Social History   Socioeconomic History  . Marital status: Married    Spouse name: Renae Fickle  . Number of children: 3  . Years of education: Not on file  . Highest education level: Some college, no degree  Occupational History  . Occupation: Orthoptist: [employer]  Social Needs  . Financial resource strain: Not on file  . Food insecurity:    Worry: Not on file    Inability: Not on file  . Transportation needs:    Medical: Not on file    Non-medical: Not on file  Tobacco Use  . Smoking status: Former Smoker    Packs/day: 0.50    Years: 40.00    Pack years: 20.00    Types: Cigarettes    Last attempt to quit: 02/11/2013    Years since quitting: 5.8  . Smokeless tobacco: Never Used  Substance and Sexual Activity  . Alcohol use: No  . Drug use: No  . Sexual activity: Yes    Comment: number of sex partners in the last 12 months  1  Lifestyle  . Physical activity:    Days per week: Not on file    Minutes per session: Not on file  . Stress: Not on file  Relationships  . Social connections:    Talks on phone: Not on file    Gets together: Not on file    Attends religious service: Not on file    Active member of club or organization: Not on file    Attends meetings of clubs or organizations: Not on file    Relationship status: Not on file  . Intimate partner violence:    Fear of current or ex partner: Not on file    Emotionally abused: Not on file    Physically abused: Not on file    Forced sexual activity: Not on file  Other Topics  Concern  . Not on file  Social History Narrative   Daily caffeine. Married. Education: [employer]. Exercise: No.      Patient is right-handed. She lives with her husband in a two level home. She drinks 2 cups of coffee a day. 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    Review of Systems Per HPI    Objective:   Physical Exam Vitals signs reviewed.  Constitutional:      General: She is not in acute distress.    Appearance: She is well-developed.  HENT:     Head: Normocephalic and atraumatic.     Right Ear: Hearing, tympanic membrane, ear canal and external ear normal.     Left Ear: Hearing, tympanic membrane, ear canal and external ear normal.     Nose: Nose normal.     Mouth/Throat:     Pharynx: No oropharyngeal exudate.  Eyes:     Conjunctiva/sclera: Conjunctivae normal.     Pupils: Pupils are equal, round, and reactive to light.  Cardiovascular:     Rate and Rhythm: Normal rate and regular rhythm.     Heart sounds: Normal heart sounds. No murmur.  Pulmonary:     Effort: Pulmonary effort is normal. No respiratory distress.     Breath sounds: Normal breath sounds. No wheezing or rhonchi.     Comments: Exam after albuterol/Atrovent neb, no audible wheeze, symptomatically improved, and single cough only with evaluation.   Skin:    General: Skin is warm and dry.     Findings: No rash.  Neurological:     Mental Status: She is alert and oriented to person, place, and time.  Psychiatric:        Behavior: Behavior normal.    Vitals:   01/02/19 1627  BP: 122/70  Pulse: (!) 105  Resp: 16  Temp: 98.3 F (36.8 C)  TempSrc: Oral  SpO2: 93%  Weight: 167 lb (75.8 kg)  Height:  (1.651 m)   Initial screening exam from door evaluation by medical assistant with wheezing, frequent coughing and dyspnea.  Albuterol/Atrovent neb given, x-ray and blood work   Dg Chest 2 View  Result Date: 01/02/2019 CLINICAL DATA:  Shortness of breath and cough for 1 week. EXAM:  CHEST - 2 VIEW COMPARISON:  September 24, 2018 FINDINGS: The heart size and mediastinal contours are within normal limits. Both lungs are clear. The visualized skeletal structures are stable. IMPRESSION: No active cardiopulmonary disease. Electronically Signed   By: Sherian Rein M.D.   On: 01/02/2019 16:46   Results for orders placed or performed in visit on 01/02/19  POCT Influenza A/B  Result Value Ref Range   Influenza A, POC Negative Negative   Influenza B, POC Negative Negative  POCT CBC  Result Value Ref Range   WBC 4.7 4.6 - 10.2 K/uL   Lymph, poc 1.1 0.6 - 3.4   POC LYMPH PERCENT 22.9 10 - 50 %L   MID (cbc) 0.3 0 - 0.9   POC MID % 6.1 0 - 12 %M   POC Granulocyte 3.3 2 - 6.9   Granulocyte percent 71.0 37 - 80 %G   RBC 4.19 4.04 - 5.48 M/uL   Hemoglobin 13.8 11 - 14.6 g/dL   HCT, POC 16.1 29 - 41 %   MCV 95.9 76 - 111 fL   MCH, POC 33.0 (A) 27 - 31.2 pg   MCHC 34.4 31.8 - 35.4 g/dL   RDW, POC 09.6 %   Platelet Count, POC 274 142 - 424 K/uL   MPV 6.2 0 - 99.8 fL        Assessment & Plan:    TEEGAN GUINTHER is a 63 y.o. female Cough - Plan: POCT Influenza A/B, benzonatate (TESSALON) 100 MG capsule  SOB (shortness of breath) - Plan: DG Chest 2 View, ipratropium-albuterol (DUONEB) 0.5-2.5 (3) MG/3ML nebulizer solution 3 mL, POCT Influenza A/B, POCT CBC  COPD exacerbation (HCC) - Plan: azithromycin (ZITHROMAX) 250 MG tablet, predniSONE (DELTASONE) 20 MG tablet, benzonatate (TESSALON) 100 MG capsule  Probable  viral illness initially with recent flare of COPD.  Afebrile, and does not report any fevers at home during course of illness.  No known specific exposure to coronavirus, but contact at home has had testing recently with unknown results.  It appears her symptoms actually started before his.  Reassuring exam after albuterol/Atrovent neb.  Reassuring x-ray, CBC, influenza testing.  -Prednisone 40 mg daily x5 days and azithromycin for COPD.  Although nonproductive, she  does feel some mucus deeper in her chest.  -Tessalon Perles as needed  Albuterol as needed, expect lessening need once prednisone started  -Symptomatic care and ER precautions discussed.  Agree with self routine until husband's results known.    Meds ordered this encounter  Medications  . ipratropium-albuterol (DUONEB) 0.5-2.5 (3) MG/3ML nebulizer solution 3 mL  . azithromycin (ZITHROMAX) 250 MG tablet    Sig: Take 2 pills by mouth on day 1, then 1 pill by mouth per day on days 2 through 5.    Dispense:  6 tablet    Refill:  0  . predniSONE (DELTASONE) 20 MG tablet    Sig: Take 2 tablets (40 mg total) by mouth daily with breakfast.    Dispense:  10 tablet    Refill:  0  . benzonatate (TESSALON) 100 MG capsule    Sig: Take 1 capsule (100 mg total) by mouth 3 (three) times daily as needed for cough.    Dispense:  20 capsule    Refill:  0   Patient Instructions   Current symptoms likely started as a virus, with flare of COPD.  Lungs are improved after nebulizer treatment here today.  Flu testing, blood counts, and chest x-ray were reassuring.  Can start prednisone and azithromycin for current COPD flare.  I still recommend avoiding others until other testing results have been known with close contact.  Tessalon Perles if needed for cough, but use albuterol up to every 4 hours if needed for now.  Expect the need for albuterol to improve within the next day or 2 at the most.  Return to the clinic or go to the nearest emergency room if any of your symptoms worsen or new symptoms occur.   Cough, Adult  Coughing is a reflex that clears your throat and your airways. Coughing helps to heal and protect your lungs. It is normal to cough occasionally, but a cough that happens with other symptoms or lasts a long time may be a sign of a condition that needs treatment. A cough may last only 2-3 weeks (acute), or it may last longer than 8 weeks (chronic). What are the causes? Coughing is commonly  caused by:  Breathing in substances that irritate your lungs.  A viral or bacterial respiratory infection.  Allergies.  Asthma.  Postnasal drip.  Smoking.  Acid backing up from the stomach into the esophagus (gastroesophageal reflux).  Certain medicines.  Chronic lung problems, including COPD (or rarely, lung cancer).  Other medical conditions such as heart failure. Follow these instructions at home: Pay attention to any changes in your symptoms. Take these actions to help with your discomfort:  Take medicines only as told by your health care provider. ? If you were prescribed an antibiotic medicine, take it as told by your health care provider. Do not stop taking the antibiotic even if you start to feel better. ? Talk with your health care provider before you take a cough suppressant medicine.  Drink enough fluid to keep your urine clear or pale yellow.  If the air is dry, use a cold steam vaporizer or humidifier in your bedroom or your home to help loosen secretions.  Avoid anything that causes you to cough at work or at home.  If your cough is worse at night, try sleeping in a semi-upright position.  Avoid cigarette smoke. If you smoke, quit smoking. If you need help quitting, ask your health care provider.  Avoid caffeine.  Avoid alcohol.  Rest as needed. Contact a health care provider if:  You have new symptoms.  You cough up pus.  Your cough does not get better after 2-3 weeks, or your cough gets worse.  You cannot control your cough with suppressant medicines and you are losing sleep.  You develop pain that is getting worse or pain that is not controlled with pain medicines.  You have a fever.  You have unexplained weight loss.  You have night sweats. Get help right away if:  You cough up blood.  You have difficulty breathing.  Your heartbeat is very fast. This information is not intended to replace advice given to you by your health care  provider. Make sure you discuss any questions you have with your health care provider. Document Released: 03/31/2011 Document Revised: 03/09/2016 Document Reviewed: 12/09/2014 Elsevier Interactive Patient Education  [employer].   If you have lab work done today you will be contacted with your lab results within the next 2 weeks.  If you have not heard from [place] then please contact us. The fastest way to get your results is to register for My Chart.   IF you received an x-ray today, you will receive an invoice from [facility]. Please contact [facility] at [phone] with questions or concerns regarding your invoice.   IF you received labwork today, you will receive an invoice from [facility]. Please contact [facility] at [phone] with questions or concerns regarding your invoice.   Our billing staff will not be able to assist you with questions regarding bills from these companies.  You will be contacted with the lab results as soon as they are available. The fastest way to get your results is to activate your My Chart account. Instructions are located on the last page of this paperwork. If you have not heard from [place] regarding the results in 2 weeks, please contact this office.       Signed,   Meredith Staggers, MD Primary Care at [facility].  01/02/19 6:54 PM

## 2019-01-03 ENCOUNTER — Other Ambulatory Visit: Payer: BLUE CROSS/BLUE SHIELD

## 2019-01-04 ENCOUNTER — Emergency Department (HOSPITAL_COMMUNITY)
Admission: EM | Admit: 2019-01-04 | Discharge: 2019-01-05 | Disposition: A | Discharge status: Home / Self Care | Payer: BLUE CROSS/BLUE SHIELD | Attending: Emergency Medicine | Admitting: Emergency Medicine

## 2019-01-04 ENCOUNTER — Other Ambulatory Visit: Payer: Self-pay

## 2019-01-04 ENCOUNTER — Encounter (HOSPITAL_COMMUNITY): Payer: Self-pay | Admitting: Emergency Medicine

## 2019-01-04 DIAGNOSIS — Z79899 Other long term (current) drug therapy: Secondary | ICD-10-CM | POA: Insufficient documentation

## 2019-01-04 DIAGNOSIS — Z7902 Long term (current) use of antithrombotics/antiplatelets: Secondary | ICD-10-CM | POA: Insufficient documentation

## 2019-01-04 DIAGNOSIS — Z7982 Long term (current) use of aspirin: Secondary | ICD-10-CM | POA: Diagnosis not present

## 2019-01-04 DIAGNOSIS — R05 Cough: Secondary | ICD-10-CM | POA: Diagnosis not present

## 2019-01-04 DIAGNOSIS — Z87891 Personal history of nicotine dependence: Secondary | ICD-10-CM | POA: Diagnosis not present

## 2019-01-04 DIAGNOSIS — J441 Chronic obstructive pulmonary disease with (acute) exacerbation: Secondary | ICD-10-CM

## 2019-01-04 DIAGNOSIS — E785 Hyperlipidemia, unspecified: Secondary | ICD-10-CM | POA: Diagnosis not present

## 2019-01-04 DIAGNOSIS — E039 Hypothyroidism, unspecified: Secondary | ICD-10-CM | POA: Insufficient documentation

## 2019-01-04 DIAGNOSIS — R0602 Shortness of breath: Secondary | ICD-10-CM | POA: Diagnosis not present

## 2019-01-04 MED ORDER — MAGNESIUM SULFATE 2 GM/50ML IV SOLN
2.0000 g | Freq: Once | INTRAVENOUS | Status: AC
  Administered 2019-01-05: 2 g via INTRAVENOUS
  Filled 2019-01-04: qty 50

## 2019-01-04 MED ORDER — METHYLPREDNISOLONE SODIUM SUCC 125 MG IJ SOLR
125.0000 mg | Freq: Once | INTRAMUSCULAR | Status: AC
  Administered 2019-01-05: 125 mg via INTRAVENOUS
  Filled 2019-01-04: qty 2

## 2019-01-04 MED ORDER — IPRATROPIUM-ALBUTEROL 0.5-2.5 (3) MG/3ML IN SOLN
3.0000 mL | Freq: Once | RESPIRATORY_TRACT | Status: AC
  Administered 2019-01-05: 3 mL via RESPIRATORY_TRACT
  Filled 2019-01-04: qty 3

## 2019-01-04 NOTE — ED Triage Notes (Signed)
Patient with week long history of shortness of breath, cough, sore throat.  She states she went to PCP and received a breathing treatment that helped her wheezing.  She was tested for flu, which she was negative for.  Her husband was tested for flu and Covid-19 which he was negative for both.  She feels like tonight she is breathing a bit worse and she feels like there is a vice around her chest.

## 2019-01-04 NOTE — ED Provider Notes (Signed)
[facility] EMERGENCY DEPARTMENT Provider Note   CSN: [id number] Arrival date & time: 01/04/19  2247    History   Chief Complaint Chief Complaint  Patient presents with  . Shortness of Breath    HPI Bethany Clarke is a 63 y.o. female.     Patient is a 63 year old female with past medical history of COPD.  She presents today with complaints of shortness of breath and cough.  This is worsened over the past week.  Her cough has been nonproductive.  She denies any fevers, chills, or chest pain.  She has had little relief with her inhaler at home.  Patient denies recent travel.  Her husband has been ill in a similar fashion.  She tells me that he was tested for Covid 19, but test came back negative.  The history is provided by the patient.  Shortness of Breath  Severity:  Moderate Onset quality:  Gradual Duration:  1 week Timing:  Constant Progression:  Worsening Chronicity:  New Relieved by:  Nothing Worsened by:  Nothing Ineffective treatments:  None tried Associated symptoms: cough   Associated symptoms: no fever     Past Medical History:  Diagnosis Date  . Atherosclerosis 11/14/2005   carotid US - mild calcific non-stenotic plaque bilaterally  . Carotid bruit 02/05/2009   Doppler - R/L ICAs 0-49% diameter reductin (velocities suggest low-mid range); L subclavian 0-49%  diameter reduction  . Chronic bronchitis (HCC)    "growing up; not anymore" (03/14/2013)  . Colon polyp    hyperplastic  . Costochondritis   . Dizziness   . Emphysema of lung (HCC)   . GERD (gastroesophageal reflux disease)   . H. pylori infection   . Heart burn   . Hemorrhoids   . Hypothyroid   . Lipidemia   . Pneumonia ~ 2009  . Subclavian artery stenosis, left (HCC)    subclavian steal physiology status post PTA and stenting  . Syncope, near     Patient Active Problem List   Diagnosis Date Noted  . Chest pain, atypical 03/17/2016  . Paresthesia 07/14/2013  .  Bilateral foot pain 05/01/2013  . Subclavian artery stenosis, left (HCC)   . Subclavian steal syndrome 03/16/2013  . Subclavian arterial stenosis (HCC) 03/11/2013  . Carotid stenosis 03/11/2013  . Hyperlipidemia with target LDL less than 70 03/11/2013  . GERD (gastroesophageal reflux disease)   . Hypothyroid   . Dizziness   . Syncope, near   . Heart burn   . Hypothyroidism 05/28/2007  . COPD GOLD II with reversibility  05/28/2007    Past Surgical History:  Procedure Laterality Date  . ABDOMINAL HYSTERECTOMY  1999  . BILATERAL UPPER EXTREMITY ANGIOGRAM N/A 03/14/2013   Procedure: BILATERAL UPPER EXTREMITY ANGIOGRAM;  Surgeon: [name], MD;  Location: [facility] CATH LAB;  Service: Cardiovascular;  Laterality: N/A;  . BREAST BIOPSY Right 1990's   "benign" (03/14/2013)  . CARDIAC CATHETERIZATION  03/03/2013  . DILATION AND CURETTAGE OF UTERUS  1980's  . LEFT HEART CATHETERIZATION WITH CORONARY ANGIOGRAM Bilateral 03/03/2013   Procedure: LEFT HEART CATHETERIZATION WITH CORONARY ANGIOGRAM;  Surgeon: Lennette Bihari, MD;  Location: [facility] CATH LAB;  Service: Cardiovascular;  Laterality: Bilateral;  . PERCUTANEOUS STENT INTERVENTION  03/14/2013   Procedure: PERCUTANEOUS STENT INTERVENTION;  Surgeon: [name], MD;  Location: [facility] CATH LAB;  Service: Cardiovascular;;  . SUBCLAVIAN ARTERY STENT Left 03/14/2013    for subclavian steal/claudication (03/14/2013)  . TUBAL LIGATION  1980's  OB History   No obstetric history on file.      Home Medications    Prior to Admission medications   Medication Sig Start Date End Date Taking? Authorizing Provider  aspirin 81 MG tablet Take 81 mg by mouth daily.    [provider]  atorvastatin (LIPITOR) 40 MG tablet Take 1 tablet (40 mg total) by mouth daily. 09/24/18   [name], MD  azithromycin (ZITHROMAX) 250 MG tablet Take 2 pills by mouth on day 1, then 1 pill by mouth per day on days 2 through 5. 01/02/19   [name],  MD  benzonatate (TESSALON) 100 MG capsule Take 1 capsule (100 mg total) by mouth 3 (three) times daily as needed for cough. 01/02/19   [name], MD  clopidogrel (PLAVIX) 75 MG tablet Take 1 tablet (75 mg total) by mouth daily. 09/24/18   [name], MD  FLUoxetine (PROZAC) 20 MG tablet Take 1 tablet (20 mg total) by mouth daily. 09/24/18   [name], MD  predniSONE (DELTASONE) 20 MG tablet Take 2 tablets (40 mg total) by mouth daily with breakfast. 01/02/19   [name], MD  SYNTHROID 100 MCG tablet Take 1 tablet (100 mcg total) by mouth daily before breakfast. 09/28/18   [name], MD    Family History Family History  Problem Relation Age of Onset  . Cancer Brother        spine/lung  . COPD Brother        smoked  . Hypertension Brother   . Rheum arthritis Brother   . Hyperlipidemia Brother   . Hypertension Brother   . Diabetes Mother   . Heart disease Mother   . Stroke Mother   . Cancer Father        cirrohosis  . Hypertension Father   . Heart disease Sister   . Diabetes Sister   . Hyperlipidemia Sister   . Hypertension Sister   . COPD Sister   . Arthritis Sister        rheumatoid  . Diabetes Sister   . Arthritis Sister        rheumatoid  . Colon cancer Neg Hx     Social History Social History   Tobacco Use  . Smoking status: Former Smoker    Packs/day: 0.50    Years: 40.00    Pack years: 20.00    Types: Cigarettes    Last attempt to quit: 02/11/2013    Years since quitting: 5.8  . Smokeless tobacco: Never Used  Substance Use Topics  . Alcohol use: No  . Drug use: No     Allergies   Patient has no known allergies.   Review of Systems Review of Systems  Constitutional: Negative for fever.  Respiratory: Positive for cough and shortness of breath.   All other systems reviewed and are negative.    Physical Exam Updated Vital Signs BP (!) 148/80 (BP Location: Right Arm)   Pulse 98   Temp 98.4 F (36.9 C) (Oral)    Resp 20   SpO2 95%   Physical Exam Vitals signs and nursing note reviewed.  Constitutional:      General: She is not in acute distress.    Appearance: She is well-developed. She is not diaphoretic.  HENT:     Head: Normocephalic and atraumatic.  Neck:     Musculoskeletal: Normal range of motion and neck supple.  Cardiovascular:     Rate and Rhythm:  Normal rate and regular rhythm.     Heart sounds: No murmur. No friction rub. No gallop.   Pulmonary:     Effort: Pulmonary effort is normal. No respiratory distress.     Breath sounds: Examination of the right-middle field reveals rhonchi. Examination of the left-middle field reveals rhonchi. Rhonchi present. No wheezing.  Abdominal:     General: Bowel sounds are normal. There is no distension.     Palpations: Abdomen is soft.     Tenderness: There is no abdominal tenderness.  Musculoskeletal: Normal range of motion.     Right lower leg: She exhibits no tenderness. No edema.     Left lower leg: She exhibits no tenderness. No edema.  Skin:    General: Skin is warm and dry.  Neurological:     Mental Status: She is alert and oriented to person, place, and time.      ED Treatments / Results  Labs (all labs ordered are listed, but only abnormal results are displayed) Labs Reviewed  BRAIN NATRIURETIC PEPTIDE  COMPREHENSIVE METABOLIC PANEL  CBC WITH DIFFERENTIAL/PLATELET    EKG None  Radiology No results found.  Procedures Procedures (including critical care time)  Medications Ordered in ED Medications  magnesium sulfate IVPB 2 g 50 mL (has no administration in time range)  ipratropium-albuterol (DUONEB) 0.5-2.5 (3) MG/3ML nebulizer solution 3 mL (has no administration in time range)  methylPREDNISolone sodium succinate (SOLU-MEDROL) 125 mg/2 mL injection 125 mg (has no administration in time range)     Initial Impression / Assessment and Plan / ED Course  I have reviewed the triage vital signs and the nursing notes.   Pertinent labs & imaging results that were available during my care of the patient were reviewed by me and considered in my medical decision making (see chart for details).  Patient with history of COPD presenting with complaints of shortness of breath.  She is feeling better after medications given in the ER.  Her chest x-ray shows COPD, however no infiltrate.  She is already taking prednisone and Zithromax.  Patient will be discharged with a nebulizer, albuterol, Robitussin with codeine, and follow-up as needed.  Final Clinical Impressions(s) / ED Diagnoses   Final diagnoses:  None    ED Discharge Orders    None       Geoffery Lyons, MD 01/05/19 0127

## 2019-01-05 ENCOUNTER — Emergency Department (HOSPITAL_COMMUNITY): Payer: BLUE CROSS/BLUE SHIELD

## 2019-01-05 DIAGNOSIS — R05 Cough: Secondary | ICD-10-CM | POA: Diagnosis not present

## 2019-01-05 DIAGNOSIS — R0602 Shortness of breath: Secondary | ICD-10-CM | POA: Diagnosis not present

## 2019-01-05 LAB — CBC WITH DIFFERENTIAL/PLATELET
Abs Immature Granulocytes: 0.04 10*3/uL (ref 0.00–0.07)
BASOS PCT: 0 %
Basophils Absolute: 0 10*3/uL (ref 0.0–0.1)
Eosinophils Absolute: 0 10*3/uL (ref 0.0–0.5)
Eosinophils Relative: 0 %
HCT: 42.1 % (ref 36.0–46.0)
Hemoglobin: 13.2 g/dL (ref 12.0–15.0)
Immature Granulocytes: 0 %
Lymphocytes Relative: 31 %
Lymphs Abs: 3.3 10*3/uL (ref 0.7–4.0)
MCH: 31.8 pg (ref 26.0–34.0)
MCHC: 31.4 g/dL (ref 30.0–36.0)
MCV: 101.4 fL — ABNORMAL HIGH (ref 80.0–100.0)
Monocytes Absolute: 1 10*3/uL (ref 0.1–1.0)
Monocytes Relative: 10 %
Neutro Abs: 6.3 10*3/uL (ref 1.7–7.7)
Neutrophils Relative %: 59 %
Platelets: 275 10*3/uL (ref 150–400)
RBC: 4.15 MIL/uL (ref 3.87–5.11)
RDW: 11.7 % (ref 11.5–15.5)
WBC: 10.7 10*3/uL — ABNORMAL HIGH (ref 4.0–10.5)
nRBC: 0 % (ref 0.0–0.2)

## 2019-01-05 LAB — COMPREHENSIVE METABOLIC PANEL
ALT: 41 U/L (ref 0–44)
AST: 27 U/L (ref 15–41)
Albumin: 4.3 g/dL (ref 3.5–5.0)
Alkaline Phosphatase: 74 U/L (ref 38–126)
Anion gap: 8 (ref 5–15)
BUN: 10 mg/dL (ref 8–23)
CO2: 29 mmol/L (ref 22–32)
Calcium: 9.7 mg/dL (ref 8.9–10.3)
Chloride: 100 mmol/L (ref 98–111)
Creatinine, Ser: 0.68 mg/dL (ref 0.44–1.00)
GFR calc Af Amer: >60 mL/min (ref >60)
GFR calc non Af Amer: >60 mL/min (ref >60)
Glucose, Bld: 113 mg/dL — ABNORMAL HIGH (ref 70–99)
POTASSIUM: 3.9 mmol/L (ref 3.5–5.1)
Sodium: 137 mmol/L (ref 135–145)
TOTAL PROTEIN: 7.3 g/dL (ref 6.5–8.1)

## 2019-01-05 LAB — TROPONIN I: Troponin I: <0.03 ng/mL (ref <0.03)

## 2019-01-05 LAB — BRAIN NATRIURETIC PEPTIDE: B Natriuretic Peptide: 35.7 pg/mL (ref 0.0–100.0)

## 2019-01-05 MED ORDER — GUAIFENESIN-CODEINE 100-10 MG/5ML PO SOLN
10.0000 mL | Freq: Once | ORAL | Status: AC
  Administered 2019-01-05: 10 mL via ORAL
  Filled 2019-01-05: qty 10

## 2019-01-05 MED ORDER — GUAIFENESIN-CODEINE 100-10 MG/5ML PO SOLN: 10.0000 mL | Freq: Four times a day (QID) | ORAL | 0 refills | Status: DC | PRN

## 2019-01-05 MED ORDER — ALBUTEROL SULFATE (2.5 MG/3ML) 0.083% IN NEBU: 2.5000 mg | INHALATION_SOLUTION | RESPIRATORY_TRACT | 1 refills | Status: DC | PRN

## 2019-01-05 MED ORDER — IPRATROPIUM-ALBUTEROL 0.5-2.5 (3) MG/3ML IN SOLN
3.0000 mL | Freq: Once | RESPIRATORY_TRACT | Status: AC
  Administered 2019-01-05: 3 mL via RESPIRATORY_TRACT
  Filled 2019-01-05: qty 3

## 2019-01-05 NOTE — ED Notes (Signed)
ED Provider at bedside. 

## 2019-01-05 NOTE — Discharge Instructions (Addendum)
Continue prednisone and Zithromax as previously prescribed.  Albuterol nebulizer treatment every 4 hours as needed for wheezing.  Return to the emergency department for worsening breathing, high fever, severe chest pain, or other new and concerning symptoms.

## 2019-01-05 NOTE — ED Notes (Signed)
Reviewed d/c instructions with pt, who verbalized understanding and had no outstanding questions. Armband & labels removed and placed in shred bin. Pt departed in NAD, refused use of wheelchair.   

## 2019-02-21 DIAGNOSIS — Z20820 Contact with and (suspected) exposure to varicella: Secondary | ICD-10-CM | POA: Diagnosis not present

## 2019-03-10 ENCOUNTER — Other Ambulatory Visit: Payer: Self-pay | Admitting: Family Medicine

## 2019-03-10 DIAGNOSIS — F418 Other specified anxiety disorders: Secondary | ICD-10-CM

## 2019-03-10 NOTE — Telephone Encounter (Signed)
Is this okay to refill? 

## 2019-03-11 NOTE — Telephone Encounter (Signed)
Requested Prescriptions  Pending Prescriptions Disp Refills  . FLUoxetine (PROZAC) 20 MG tablet [Pharmacy Med Name: FLUOXETINE HCL 20 MG TABLET] 90 tablet 1    Sig: TAKE 1 TABLET BY MOUTH EVERY DAY     Psychiatry:  Antidepressants - SSRI Passed - 03/10/2019  9:51 AM      Passed - Valid encounter within last 6 months    Recent Outpatient Visits          2 months ago Cough   [facility], Asencion Partridge, MD   4 months ago Dizziness   [facility], Asencion Partridge, MD   5 months ago Chest pain, unspecified type   [facility], Asencion Partridge, MD   5 months ago Hyperlipidemia, unspecified hyperlipidemia type   [facility], Asencion Partridge, MD   1 year ago Near syncope   [facility], Asencion Partridge, MD

## 2019-04-14 ENCOUNTER — Other Ambulatory Visit: Payer: Self-pay | Admitting: Family Medicine

## 2019-04-14 DIAGNOSIS — E039 Hypothyroidism, unspecified: Secondary | ICD-10-CM

## 2019-06-02 ENCOUNTER — Other Ambulatory Visit: Payer: Self-pay | Admitting: Family Medicine

## 2019-06-02 DIAGNOSIS — F418 Other specified anxiety disorders: Secondary | ICD-10-CM

## 2019-06-02 NOTE — Telephone Encounter (Signed)
Please advise   Patient is requesting a refill of the following medications: Requested Prescriptions   Pending Prescriptions Disp Refills  . FLUoxetine (PROZAC) 20 MG tablet [Pharmacy Med Name: FLUOXETINE HCL 20 MG TABLET] 90 tablet 0    Sig: TAKE 1 TABLET BY MOUTH EVERY DAY    Date of patient request: 06/02/19 Last office visit: 01/02/19 Date of last refill: 03/11/19 Last refill amount: 90 tab,0 refills Follow up time period per chart: n/a

## 2019-06-02 NOTE — Telephone Encounter (Signed)
Requested medication (s) are due for refill today:   Yes  Requested medication (s) are on the active medication list:   Yes  Future visit scheduled:   No     Last ordered: 03/11/2019  #90  0 refills   Requested Prescriptions  Pending Prescriptions Disp Refills   FLUoxetine (PROZAC) 20 MG tablet [Pharmacy Med Name: FLUOXETINE HCL 20 MG TABLET] 90 tablet 0    Sig: TAKE 1 TABLET BY [place]     Psychiatry:  Antidepressants - SSRI Failed - 06/02/2019  1:29 AM      Failed - Valid encounter within last 6 months    Recent Outpatient Visits          5 months ago Cough   [facility], Ranell Patrick, MD   7 months ago Dizziness   [facility], Ranell Patrick, MD   8 months ago Chest pain, unspecified type   [facility], Ranell Patrick, MD   8 months ago Hyperlipidemia, unspecified hyperlipidemia type   [facility], Ranell Patrick, MD   1 year ago Near syncope   [facility], Ranell Patrick, MD             Failed - Completed PHQ-2 or PHQ-9 in the last 360 days.

## 2019-06-03 NOTE — Telephone Encounter (Signed)
Fluoxetine refilled, but please schedule follow-up visit in the next 2 to 3 months at the most.

## 2019-06-09 DIAGNOSIS — M79642 Pain in left hand: Secondary | ICD-10-CM | POA: Diagnosis not present

## 2019-06-25 DIAGNOSIS — M79642 Pain in left hand: Secondary | ICD-10-CM | POA: Diagnosis not present

## 2019-07-17 ENCOUNTER — Other Ambulatory Visit: Payer: Self-pay | Admitting: Family Medicine

## 2019-07-17 DIAGNOSIS — M25542 Pain in joints of left hand: Secondary | ICD-10-CM | POA: Insufficient documentation

## 2019-07-17 DIAGNOSIS — M79642 Pain in left hand: Secondary | ICD-10-CM | POA: Diagnosis not present

## 2019-07-17 DIAGNOSIS — G458 Other transient cerebral ischemic attacks and related syndromes: Secondary | ICD-10-CM

## 2019-07-17 DIAGNOSIS — E785 Hyperlipidemia, unspecified: Secondary | ICD-10-CM

## 2019-07-17 DIAGNOSIS — F418 Other specified anxiety disorders: Secondary | ICD-10-CM

## 2019-07-18 NOTE — Telephone Encounter (Signed)
Requested medication (s) are due for refill today: yes  Requested medication (s) are on the active medication list: yes  Last refill:  06/03/2019  Future visit scheduled: no  Notes to clinic:  Review for refill   Requested Prescriptions  Pending Prescriptions Disp Refills   FLUoxetine (PROZAC) 20 MG tablet [Pharmacy Med Name: FLUOXETINE HCL 20 MG TABLET] 90 tablet 0    Sig: TAKE 1 TABLET BY [place]     Psychiatry:  Antidepressants - SSRI Failed - 07/17/2019  9:24 PM      Failed - Valid encounter within last 6 months    Recent Outpatient Visits          6 months ago Cough   [facility], Ranell Patrick, MD   9 months ago Dizziness   [facility], Ranell Patrick, MD   9 months ago Chest pain, unspecified type   [facility], MD   10 months ago Hyperlipidemia, unspecified hyperlipidemia type   [facility], Ranell Patrick, MD   1 year ago Near syncope   [facility], Ranell Patrick, MD             Failed - Completed PHQ-2 or PHQ-9 in the last 360 days.       clopidogrel (PLAVIX) 75 MG tablet [Pharmacy Med Name: CLOPIDOGREL 75 MG TABLET] 90 tablet 1    Sig: TAKE 1 TABLET BY MOUTH EVERY DAY     Hematology: Antiplatelets - clopidogrel Failed - 07/17/2019  9:24 PM      Failed - Evaluate AST, ALT within 2 months of therapy initiation.      Failed - HCT in normal range and within 180 days    HCT  Date Value Ref Range Status  01/04/2019 42.1 36.0 - 46.0 % Final   Hematocrit  Date Value Ref Range Status  09/27/2017 39.8 34.0 - 46.6 % Final         Failed - HGB in normal range and within 180 days    Hemoglobin  Date Value Ref Range Status  01/04/2019 13.2 12.0 - 15.0 g/dL Final  09/27/2017 13.3 11.1 - 15.9 g/dL Final         Failed - PLT in normal range and within 180 days    Platelets  Date Value Ref Range Status  01/04/2019 275 150 - 400 K/uL Final  09/27/2017 263 150 - 379  [id number]/uL Final   Platelet Count, POC  Date Value Ref Range Status  01/02/2019 274 142 - 424 K/uL Final         Failed - Valid encounter within last 6 months    Recent Outpatient Visits          6 months ago Cough   [facility], Ranell Patrick, MD   9 months ago Dizziness   [facility], Ranell Patrick, MD   9 months ago Chest pain, unspecified type   [facility], Ranell Patrick, MD   10 months ago Hyperlipidemia, unspecified hyperlipidemia type   [facility], Ranell Patrick, MD   1 year ago Near syncope   [facility], Ranell Patrick, MD             Passed - ALT in normal range and within 360 days    ALT  Date Value Ref Range Status  01/04/2019 41 0 -  44 U/L Final         Passed - AST in normal range and within 360 days    AST  Date Value Ref Range Status  01/04/2019 27 15 - 41 U/L Final          atorvastatin (LIPITOR) 40 MG tablet [Pharmacy Med Name: ATORVASTATIN 40 MG TABLET] 90 tablet 1    Sig: TAKE 1 TABLET BY MOUTH EVERY DAY     Cardiovascular:  Antilipid - Statins Passed - 07/17/2019  9:24 PM      Passed - Total Cholesterol in normal range and within 360 days    Cholesterol, Total  Date Value Ref Range Status  09/24/2018 161 100 - 199 mg/dL Final         Passed - LDL in normal range and within 360 days    LDL Calculated  Date Value Ref Range Status  09/24/2018 55 0 - 99 mg/dL Final         Passed - HDL in normal range and within 360 days    HDL  Date Value Ref Range Status  09/24/2018 84 >39 mg/dL Final         Passed - Triglycerides in normal range and within 360 days    Triglycerides  Date Value Ref Range Status  09/24/2018 112 0 - 149 mg/dL Final         Passed - Patient is not pregnant      Passed - Valid encounter within last 12 months    Recent Outpatient Visits          6 months ago Cough   [facility], Asencion Partridge, MD   9 months ago Dizziness   [facility], Asencion Partridge, MD   9 months ago Chest pain, unspecified type   [facility], Asencion Partridge, MD   10 months ago Hyperlipidemia, unspecified hyperlipidemia type   [facility], Asencion Partridge, MD   1 year ago Near syncope   [facility], Asencion Partridge, MD

## 2019-07-24 NOTE — Telephone Encounter (Signed)
Lipitor, Prozac, Plavix refilled but due for follow-up including labs.  Please schedule appointment in the next 4 to 6 weeks if possible.

## 2019-07-25 NOTE — Telephone Encounter (Signed)
Spoke with pt and scheduled appt for med refills. Pt stated she did not all meds refilled. Only atorvastatin

## 2019-08-11 DIAGNOSIS — M79642 Pain in left hand: Secondary | ICD-10-CM | POA: Diagnosis not present

## 2019-08-11 DIAGNOSIS — M25542 Pain in joints of left hand: Secondary | ICD-10-CM | POA: Diagnosis not present

## 2019-09-04 ENCOUNTER — Encounter: Payer: Self-pay | Admitting: Family Medicine

## 2019-09-05 ENCOUNTER — Ambulatory Visit: Payer: BLUE CROSS/BLUE SHIELD | Admitting: Family Medicine

## 2019-09-15 ENCOUNTER — Encounter: Payer: Self-pay | Admitting: Nurse Practitioner

## 2019-09-30 ENCOUNTER — Other Ambulatory Visit: Payer: Self-pay | Admitting: Family Medicine

## 2019-09-30 ENCOUNTER — Telehealth: Payer: Self-pay | Admitting: *Deleted

## 2019-09-30 DIAGNOSIS — E039 Hypothyroidism, unspecified: Secondary | ICD-10-CM

## 2019-09-30 NOTE — Telephone Encounter (Signed)
Patient was sent in 30 days for prescription  Please schedule an appointment

## 2019-09-30 NOTE — Telephone Encounter (Signed)
Requested medication (s) are due for refill today:yes  Requested medication (s) are on the active medication list: yes  Last refill:  07/13/2019  Future visit scheduled: no  Notes to clinic:  Patient due for lab recheck  Review for refill   Requested Prescriptions  Pending Prescriptions Disp Refills   SYNTHROID 100 MCG tablet [Pharmacy Med Name: SYNTHROID 100 MCG TABLET] 90 tablet 1    Sig: TAKE 1 TABLET BY MOUTH EVERY [place]      Endocrinology:  Hypothyroid Agents Failed - 09/30/2019  2:50 AM      Failed - TSH needs to be rechecked within 3 months after an abnormal result. Refill until TSH is due.      Failed - TSH in normal range and within 360 days    TSH  Date Value Ref Range Status  09/24/2018 0.940 0.450 - 4.500 uIU/mL Final          Passed - Valid encounter within last 12 months    Recent Outpatient Visits           9 months ago Cough   [facility], Ranell Patrick, MD   11 months ago Dizziness   [facility], Ranell Patrick, MD   1 year ago Chest pain, unspecified type   [facility], Ranell Patrick, MD   1 year ago Hyperlipidemia, unspecified hyperlipidemia type   [facility], Ranell Patrick, MD   1 year ago Near syncope   [facility], Ranell Patrick, MD

## 2019-10-03 ENCOUNTER — Ambulatory Visit: Payer: BLUE CROSS/BLUE SHIELD | Admitting: Nurse Practitioner

## 2019-10-19 ENCOUNTER — Other Ambulatory Visit: Payer: Self-pay | Admitting: Family Medicine

## 2019-10-19 DIAGNOSIS — E785 Hyperlipidemia, unspecified: Secondary | ICD-10-CM

## 2019-10-19 NOTE — Telephone Encounter (Signed)
Requested medication (s) are due for refill today: no  Requested medication (s) are on the active medication list:  yes  Last refill:  07/24/2019  Future visit scheduled: yes  Notes to clinic:  Patient has upcoming appointment on 10/22/2018 Review for refill   Requested Prescriptions  Pending Prescriptions Disp Refills   atorvastatin (LIPITOR) 40 MG tablet [Pharmacy Med Name: ATORVASTATIN 40 MG TABLET] 90 tablet 0    Sig: TAKE 1 TABLET BY MOUTH EVERY DAY      Cardiovascular:  Antilipid - Statins Failed - 10/19/2019  1:15 AM      Failed - Total Cholesterol in normal range and within 360 days    Cholesterol, Total  Date Value Ref Range Status  09/24/2018 161 100 - 199 mg/dL Final          Failed - LDL in normal range and within 360 days    LDL Calculated  Date Value Ref Range Status  09/24/2018 55 0 - 99 mg/dL Final          Failed - HDL in normal range and within 360 days    HDL  Date Value Ref Range Status  09/24/2018 84 >39 mg/dL Final          Failed - Triglycerides in normal range and within 360 days    Triglycerides  Date Value Ref Range Status  09/24/2018 112 0 - 149 mg/dL Final          Failed - Valid encounter within last 12 months    Recent Outpatient Visits           9 months ago Cough   [facility], Asencion Partridge, MD   1 year ago Dizziness   [facility], Asencion Partridge, MD   1 year ago Chest pain, unspecified type   [facility], Asencion Partridge, MD   1 year ago Hyperlipidemia, unspecified hyperlipidemia type   [facility], Asencion Partridge, MD   1 year ago Near syncope   [facility], Asencion Partridge, MD       Future Appointments             In 4 days [name], MD [facility] [place] [facility]            Passed - Patient is not pregnant

## 2019-10-23 ENCOUNTER — Other Ambulatory Visit: Payer: Self-pay

## 2019-10-23 ENCOUNTER — Ambulatory Visit (INDEPENDENT_AMBULATORY_CARE_PROVIDER_SITE_OTHER): Payer: BC Managed Care – PPO | Admitting: Family Medicine

## 2019-10-23 VITALS — BP 130/70 | HR 67 | Temp 98.5°F | Ht 65.0 in | Wt 179.8 lb

## 2019-10-23 DIAGNOSIS — F418 Other specified anxiety disorders: Secondary | ICD-10-CM | POA: Diagnosis not present

## 2019-10-23 DIAGNOSIS — E039 Hypothyroidism, unspecified: Secondary | ICD-10-CM

## 2019-10-23 DIAGNOSIS — J449 Chronic obstructive pulmonary disease, unspecified: Secondary | ICD-10-CM | POA: Diagnosis not present

## 2019-10-23 DIAGNOSIS — G458 Other transient cerebral ischemic attacks and related syndromes: Secondary | ICD-10-CM | POA: Diagnosis not present

## 2019-10-23 DIAGNOSIS — E785 Hyperlipidemia, unspecified: Secondary | ICD-10-CM

## 2019-10-23 DIAGNOSIS — Z0000 Encounter for general adult medical examination without abnormal findings: Secondary | ICD-10-CM

## 2019-10-23 DIAGNOSIS — Z23 Encounter for immunization: Secondary | ICD-10-CM

## 2019-10-23 DIAGNOSIS — Z0001 Encounter for general adult medical examination with abnormal findings: Secondary | ICD-10-CM | POA: Diagnosis not present

## 2019-10-23 MED ORDER — ATORVASTATIN CALCIUM 40 MG PO TABS: 40.0000 mg | ORAL_TABLET | Freq: Every day | ORAL | 2 refills | Status: DC

## 2019-10-23 MED ORDER — SPIRIVA HANDIHALER 18 MCG IN CAPS: 18.0000 ug | ORAL_CAPSULE | Freq: Every day | RESPIRATORY_TRACT | 6 refills | Status: DC

## 2019-10-23 MED ORDER — CLOPIDOGREL BISULFATE 75 MG PO TABS: 75.0000 mg | ORAL_TABLET | Freq: Every day | ORAL | 2 refills | Status: DC

## 2019-10-23 MED ORDER — ALBUTEROL SULFATE HFA 108 (90 BASE) MCG/ACT IN AERS: 1.0000 | INHALATION_SPRAY | RESPIRATORY_TRACT | 0 refills | Status: DC | PRN

## 2019-10-23 MED ORDER — SYNTHROID 100 MCG PO TABS: ORAL_TABLET | ORAL | 2 refills | Status: DC

## 2019-10-23 MED ORDER — SHINGRIX 50 MCG/0.5ML IM SUSR: 0.5000 mL | Freq: Once | INTRAMUSCULAR | 1 refills | Status: AC

## 2019-10-23 MED ORDER — FLUOXETINE HCL 20 MG PO TABS: 20.0000 mg | ORAL_TABLET | Freq: Every day | ORAL | 2 refills | Status: DC

## 2019-10-23 NOTE — Progress Notes (Signed)
Subjective:  Patient ID: Bethany Clarke, female    DOB: 1956/09/26  Age: 64 y.o. MRN: [id number]  CC:  Chief Complaint  Patient presents with  . Annual Exam    general health pt states she feels good.  . Medication Refill    synthroid,albuterol sulfate,plavix, lipitor, prozac.    HPI Bethany Clarke presents for   Annual exam and med refills.   Hypothyroidism: Lab Results  Component Value Date   TSH 1.090 10/23/2019   Taking medication daily.  Synthroid 100 mcg daily. No new hot or cold intolerance. No new hair or skin changes, heart palpitations or new fatigue. No new weight changes.   Depression/anxiety: Restarted Prozac December 2019.  Klonopin as needed with intermittent use in the past. Controlled substance database (PDMP) reviewed. No concerns appreciated.  Last prescription for Klonopin No. 20 on May 29. Had to use a few times in December.  feels like overall controlled prozac has been doing well.  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   Depression screen [facility] 2/9 10/23/2019 01/02/2019 09/24/2018 01/15/2018 01/03/2018  Decreased Interest 0 0 3 0 0  Down, Depressed, Hopeless 0 0 3 0 0  PHQ - 2 Score 0 0 6 0 0  Altered sleeping - - 3 - -  Tired, decreased energy - - 1 - -  Change in appetite - - 0 - -  Feeling bad or failure about yourself  - - 0 - -  Trouble concentrating - - 0 - -  Moving slowly or fidgety/restless - - 0 - -  Suicidal thoughts - - 0 - -  PHQ-9 Score - - 10 - -  Difficult doing work/chores - - Somewhat difficult - -   Hyperlipidemia: Lipitor 40 mg daily.  No new side effects/myalgias.  History of subclavian artery stenosis, subclavian steal syndrome.  Cardiologist Dr. Allyson Sabal, takes Plavix 75 mg daily with routine follow-up.  Last cardiology appointment noted April 2019 with Dr. Tresa Endo.  Carotid artery ultrasound 11/07/2018.  1 to 39% stenosis bilaterally, stable on the right compared to prior exam, decreased on the left compared to prior exam.   Repeat test scheduled for 11/13/2019.   Lab Results  Component Value Date   CHOL 153 10/23/2019   HDL 67 10/23/2019   LDLCALC 65 10/23/2019   TRIG 119 10/23/2019   CHOLHDL 2.3 10/23/2019   Lab Results  Component Value Date   ALT 29 10/23/2019   AST 19 10/23/2019   ALKPHOS 85 10/23/2019   BILITOT <0.2 10/23/2019   COPD Quit smoking approximate 4 years ago, previous treatment with Symbicort, rare use of albuterol in the past.  COPD exacerbation March 2020 treated with prednisone and azithromycin.  ER treatment with Robitussin with codeine, albuterol and nebulizer. Ran out of albuterol since May. Low O2 at 88 at times when short of breath.  Short of breath with activity since last march - few times per day.  Not on symbicort- mentally does not want to use this - does not want to use steroid.  Agreeable   Cancer screening/health maintenance.  Overdue for colonoscopy - declines testing - wants to wait until less covid in community. Same for mammogram. Has gynecologist - plans on scheduling pap after Covid. due for hep C screening and HIV screening - declines both.    Immunization History  Administered Date(s) Administered  . Influenza, Seasonal, Injecte, Preservative Fre 08/18/2017  . Influenza,inj,Quad PF,6+ Mos 12/01/2015, 08/26/2017, 07/28/2018, 06/08/2019  . Pneumococcal Polysaccharide-23 10/17/2003  .  Rotavirus Monovalent 10/16/2000  . Td 10/16/2000  . Tdap 03/15/2007, 04/11/2017  shingrix: agrees to vaccine.     Hearing Screening   125Hz  250Hz  500Hz  1000Hz  2000Hz  3000Hz  4000Hz  6000Hz  8000Hz   Right ear:           Left ear:             Visual Acuity Screening   Right eye Left eye Both eyes  Without correction:     With correction: 25-1 20/30 20-1  plans to schedule appt with optho.   Dental: yearly.   Exercise: walking 2 miles per day.    History Patient Active Problem List   Diagnosis Date Noted  . Chest pain, atypical 03/17/2016  . Paresthesia 07/14/2013    . Bilateral foot pain 05/01/2013  . Subclavian artery stenosis, left (HCC)   . Subclavian steal syndrome 03/16/2013  . Subclavian arterial stenosis (HCC) 03/11/2013  . Carotid stenosis 03/11/2013  . Hyperlipidemia with target LDL less than 70 03/11/2013  . GERD (gastroesophageal reflux disease)   . Hypothyroid   . Dizziness   . Syncope, near   . Heart burn   . Hypothyroidism 05/28/2007  . COPD GOLD II with reversibility  05/28/2007   Past Medical History:  Diagnosis Date  . Atherosclerosis 11/14/2005   carotid 07/16/2013 - mild calcific non-stenotic plaque bilaterally  . Carotid bruit 02/05/2009   Doppler - R/L ICAs 0-49% diameter reductin (velocities suggest low-mid range); L subclavian 0-49%  diameter reduction  . Chronic bronchitis (HCC)    "growing up; not anymore" (03/14/2013)  . Colon polyp    hyperplastic  . Costochondritis   . Dizziness   . Emphysema of lung (HCC)   . GERD (gastroesophageal reflux disease)   . H. pylori infection   . Heart burn   . Hemorrhoids   . Hypothyroid   . Lipidemia   . Pneumonia ~ 2009  . Subclavian artery stenosis, left (HCC)    subclavian steal physiology status post PTA and stenting  . Syncope, near    Past Surgical History:  Procedure Laterality Date  . ABDOMINAL HYSTERECTOMY  1999  . BILATERAL UPPER EXTREMITY ANGIOGRAM N/A 03/14/2013   Procedure: BILATERAL UPPER EXTREMITY ANGIOGRAM;  Surgeon: 03/13/2013, MD;  Location: [facility] CATH LAB;  Service: Cardiovascular;  Laterality: N/A;  . BREAST BIOPSY Right 1990's   "benign" (03/14/2013)  . CARDIAC CATHETERIZATION  03/03/2013  . DILATION AND CURETTAGE OF UTERUS  1980's  . LEFT HEART CATHETERIZATION WITH CORONARY ANGIOGRAM Bilateral 03/03/2013   Procedure: LEFT HEART CATHETERIZATION WITH CORONARY ANGIOGRAM;  Surgeon: 02/07/2009, MD;  Location: [facility] CATH LAB;  Service: Cardiovascular;  Laterality: Bilateral;  . PERCUTANEOUS STENT INTERVENTION  03/14/2013   Procedure: PERCUTANEOUS STENT  INTERVENTION;  Surgeon: 03/16/2013, MD;  Location: [facility] CATH LAB;  Service: Cardiovascular;;  . SUBCLAVIAN ARTERY STENT Left 03/14/2013    for subclavian steal/claudication (03/14/2013)  . TUBAL LIGATION  1980's   No Known Allergies Prior to Admission medications   Medication Sig Start Date End Date Taking? Authorizing Provider  SYNTHROID 100 MCG tablet TAKE 1 TABLET BY MOUTH EVERY DAY BEFORE BREAKFAST 09/30/19   03/05/2013, MD  albuterol (PROVENTIL) (2.5 MG/3ML) 0.083% nebulizer solution Take 3 mLs (2.5 mg total) by nebulization every 4 (four) hours as needed for wheezing or shortness of breath. 01/05/19   [facility], MD  aspirin 81 MG tablet Take 81 mg by mouth daily.    [provider]  atorvastatin (LIPITOR) 40  MG tablet TAKE 1 TABLET BY MOUTH EVERY DAY 10/22/19   Shade FloodGreene, Sherilynn Dieu R, MD  azithromycin (ZITHROMAX) 250 MG tablet Take 2 pills by mouth on day 1, then 1 pill by mouth per day on days 2 through 5. 01/02/19   Shade FloodGreene, Montell Leopard R, MD  benzonatate (TESSALON) 100 MG capsule Take 1 capsule (100 mg total) by mouth 3 (three) times daily as needed for cough. 01/02/19   Shade FloodGreene, Catrice Zuleta R, MD  clopidogrel (PLAVIX) 75 MG tablet TAKE 1 TABLET BY MOUTH EVERY DAY 07/24/19   Shade FloodGreene, Evan Osburn R, MD  FLUoxetine (PROZAC) 20 MG tablet TAKE 1 TABLET BY MOUTH EVERY DAY 07/24/19   Shade FloodGreene, Samaya Boardley R, MD  guaiFENesin-codeine 100-10 MG/5ML syrup Take 10 mLs by mouth every 6 (six) hours as needed for cough. 01/05/19   Geoffery Lyonselo, Douglas, MD  predniSONE (DELTASONE) 20 MG tablet Take 2 tablets (40 mg total) by mouth daily with breakfast. 01/02/19   Shade FloodGreene, Arthi Mcdonald R, MD   Social History   Socioeconomic History  . Marital status: Married    Spouse name: Renae Fickleaul  . Number of children: 3  . Years of education: Not on file  . Highest education level: Some college, no degree  Occupational History  . Occupation: Orthoptistacility Mananger    Employer: [employer]  Tobacco Use  . Smoking status: Former Smoker      Packs/day: 0.50    Years: 40.00    Pack years: 20.00    Types: Cigarettes    Quit date: 02/11/2013    Years since quitting: 6.7  . Smokeless tobacco: Never Used  Substance and Sexual Activity  . Alcohol use: No  . Drug use: No  . Sexual activity: Yes    Comment: number of sex partners in the last 12 months  1  Other Topics Concern  . Not on file  Social History Narrative   Daily caffeine. Married. Education: [employer]. Exercise: No.      Patient is right-handed. She lives with her husband in a two level home. She drinks 2 cups of coffee a day. 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   Social Determinants of Health   Financial Resource Strain:   . Difficulty of Paying Living Expenses: Not on file  Food Insecurity:   . Worried About Programme researcher, broadcasting/film/videounning Out of Food in the Last Year: Not on file  . Ran Out of Food in the Last Year: Not on file  Transportation Needs:   . Lack of Transportation (Medical): Not on file  . Lack of Transportation (Non-Medical): Not on file  Physical Activity:   . Days of Exercise per Week: Not on file  . Minutes of Exercise per Session: Not on file  Stress:   . Feeling of Stress : Not on file  Social Connections:   . Frequency of Communication with Friends and Family: Not on file  . Frequency of Social Gatherings with Friends and Family: Not on file  . Attends Religious Services: Not on file  . Active Member of Clubs or Organizations: Not on file  . Attends BankerClub or Organization Meetings: Not on file  . Marital Status: Not on file  Intimate Partner Violence:   . Fear of Current or Ex-Partner: Not on file  . Emotionally Abused: Not on file  . Physically Abused: Not on file  . Sexually Abused: Not on file    Review of Systems  13 point review of systems per patient health survey noted.  Negative other  than as indicated above or in HPI.   Objective:   Vitals:   10/23/19 1107 10/23/19 1108  BP: 139/83 130/70  Pulse: 67   Temp: 98.5 F  (36.9 C)   TempSrc: Temporal   SpO2: 95%   Weight: 179 lb 12.8 oz (81.6 kg)   Height: 5\' 5"  (1.651 m)      Physical Exam Vitals reviewed.  Constitutional:      Appearance: She is well-developed.  HENT:     Head: Normocephalic and atraumatic.     Right Ear: External ear normal.     Left Ear: External ear normal.  Eyes:     Conjunctiva/sclera: Conjunctivae normal.     Pupils: Pupils are equal, round, and reactive to light.  Neck:     Thyroid: No thyromegaly.  Cardiovascular:     Rate and Rhythm: Normal rate and regular rhythm.     Heart sounds: Normal heart sounds. No murmur.  Pulmonary:     Effort: Pulmonary effort is normal. No respiratory distress.     Breath sounds: Normal breath sounds. No wheezing.  Abdominal:     General: Bowel sounds are normal.     Palpations: Abdomen is soft.     Tenderness: There is no abdominal tenderness.  Musculoskeletal:        General: No tenderness. Normal range of motion.     Cervical back: Normal range of motion and neck supple.  Lymphadenopathy:     Cervical: No cervical adenopathy.  Skin:    General: Skin is warm and dry.     Findings: No rash.  Neurological:     Mental Status: She is alert and oriented to person, place, and time.  Psychiatric:        Behavior: Behavior normal.        Thought Content: Thought content normal.        Assessment & Plan:  Arville LimeKatrina Y Nicole is a 64 y.o. female . Annual physical exam  - -anticipatory guidance as below in AVS, screening labs above. Health maintenance items as above in HPI discussed/recommended as applicable.   Need for shingles vaccine - Plan: Zoster Vaccine Adjuvanted [facility](SHINGRIX) injection to pharmacy  Hyperlipidemia, unspecified hyperlipidemia type - Plan: Comprehensive metabolic panel, Lipid panel, atorvastatin (LIPITOR) 40 MG tablet  -  Stable, tolerating current regimen. Medications refilled. Labs pending as above.   Subclavian steal syndrome - Plan: clopidogrel (PLAVIX)  75 MG tablet  -Continue Plavix, follow-up with cardiology.  Follow-up ultrasound planned  Depression with anxiety - Plan: FLUoxetine (PROZAC) 20 MG tablet  -Stable with rare use of benzodiazepine.  Continue Prozac.  Hypothyroidism, unspecified type - Plan: TSH, SYNTHROID 100 MCG tablet  -Symptomatically stable, continue same dose of Synthroid, labs pending   chronic obstructive pulmonary disease, unspecified COPD type (HCC) - Plan: tiotropium (SPIRIVA HANDIHALER) 18 MCG inhalation capsule, Ambulatory referral to Pulmonology, albuterol (VENTOLIN HFA) 108 (90 Base) MCG/ACT inhaler  -Decreased control.  Does have some psychological resistance to inhaled steroids given her brothers history.  Did agree to try Spiriva and follow-up with pulmonary to discuss potential treatment options.  Albuterol refilled if needed with ER/RTC precautions.  Meds ordered this encounter  Medications  . tiotropium (SPIRIVA HANDIHALER) 18 MCG inhalation capsule    Sig: Place 1 capsule (18 mcg total) into inhaler and inhale daily.    Dispense:  30 capsule    Refill:  6  . Zoster Vaccine Adjuvanted [facility](SHINGRIX) injection    Sig: Inject 0.5 mLs into the muscle  once for 1 dose. Repeat in 2-6 months.    Dispense:  0.5 mL    Refill:  1  . atorvastatin (LIPITOR) 40 MG tablet    Sig: Take 1 tablet (40 mg total) by mouth daily.    Dispense:  90 tablet    Refill:  2  . clopidogrel (PLAVIX) 75 MG tablet    Sig: Take 1 tablet (75 mg total) by mouth daily.    Dispense:  90 tablet    Refill:  2  . FLUoxetine (PROZAC) 20 MG tablet    Sig: Take 1 tablet (20 mg total) by mouth daily.    Dispense:  90 tablet    Refill:  2  . SYNTHROID 100 MCG tablet    Sig: TAKE 1 TABLET BY MOUTH EVERY DAY BEFORE BREAKFAST    Dispense:  90 tablet    Refill:  2  . albuterol (VENTOLIN HFA) 108 (90 Base) MCG/ACT inhaler    Sig: Inhale 1-2 puffs into the lungs every 4 (four) hours as needed for wheezing or shortness of breath.    Dispense:  18  g    Refill:  0   Patient Instructions   I will refill plavix, follow up with cardiologist as planned as well as planned ultrasound.  Start spiriva once per day, albuterol if needed, and I will refer you to pulmonary. Return to the clinic or go to the nearest emergency room if any of your symptoms worsen or new symptoms occur.   Recheck in 3 months.   Here is a link to information about the COVID-19 vaccine: [email or web address]-[email or web address]   Keeping you healthy  Get these tests  Blood pressure- Have your blood pressure checked once a year by your healthcare provider.  Normal blood pressure is 120/80  Weight- Have your body mass index (BMI) calculated to screen for obesity.  BMI is a measure of body fat based on height and weight. You can also calculate your own BMI at ProgramCam.[email or web address].  Cholesterol- Have your cholesterol checked every year.  Diabetes- Have your blood sugar checked regularly if you have high blood pressure, high cholesterol, have a family history of diabetes or if you are overweight.  Screening for Colon Cancer- Colonoscopy starting at age 45.  Screening may begin sooner depending on your family history and other health conditions. Follow up colonoscopy as directed by your Gastroenterologist.  Screening for Prostate Cancer- Both blood work (PSA) and a rectal exam help screen for Prostate Cancer.  Screening begins at age 42 with African-American men and at age 37 with Caucasian men.  Screening may begin sooner depending on your family history.  Take these medicines  Aspirin- One aspirin daily can help prevent Heart disease and Stroke.  Flu shot- Every fall.  Tetanus- Every 10 years.  Zostavax- Once after the age of 60 to prevent Shingles.  Pneumonia shot- Once after the age of 6; if you are younger than 50, ask your healthcare provider if you need a Pneumonia shot.  Take these steps  Don't smoke- If you do smoke, talk to your doctor about  quitting.  For tips on how to quit, go to www.smokefree.gov or call 1-800-QUIT-NOW.  Be physically active- Exercise 5 days a week for at least 30 minutes.  If you are not already physically active start slow and gradually work up to 30 minutes of moderate physical activity.  Examples of moderate activity include walking briskly, mowing the yard, dancing, swimming, bicycling, etc.  Eat a healthy diet- Eat  a variety of healthy food such as fruits, vegetables, low fat milk, low fat cheese, yogurt, lean meant, poultry, fish, beans, tofu, etc. For more information go to www.thenutritionsource.org  Drink alcohol in moderation- Limit alcohol intake to less than two drinks a day. Never drink and drive.  Dentist- Brush and floss twice daily; visit your dentist twice a year.  Depression- Your emotional health is as important as your physical health. If you're feeling down, or losing interest in things you would normally enjoy please talk to your healthcare provider.  Eye exam- Visit your eye doctor every year.  Safe sex- If you may be exposed to a sexually transmitted infection, use a condom.  Seat belts- Seat belts can save your life; always wear one.  Smoke/Carbon Monoxide detectors- These detectors need to be installed on the appropriate level of your home.  Replace batteries at least once a year.  Skin cancer- When out in the sun, cover up and use sunscreen 15 SPF or higher.  Violence- If anyone is threatening you, please tell your healthcare provider.  Living Will/ Health care power of attorney- Speak with your healthcare provider and family.    If you have lab work done today you will be contacted with your lab results within the next 2 weeks.  If you have not heard from [place] then please contact us. The fastest way to get your results is to register for My Chart.   IF you received an x-ray today, you will receive an invoice from [facility]. Please contact [facility] at  [phone] with questions or concerns regarding your invoice.   IF you received labwork today, you will receive an invoice from [place]. Please contact [facility] at [phone] with questions or concerns regarding your invoice.   Our billing staff will not be able to assist you with questions regarding bills from these companies.  You will be contacted with the lab results as soon as they are available. The fastest way to get your results is to activate your My Chart account. Instructions are located on the last page of this paperwork. If you have not heard from [place] regarding the results in 2 weeks, please contact this office.         Signed, Merri Ray, MD [facility]

## 2019-10-23 NOTE — Patient Instructions (Addendum)
I will refill plavix, follow up with cardiologist as planned as well as planned ultrasound.  Start spiriva once per day, albuterol if needed, and I will refer you to pulmonary. Return to the clinic or go to the nearest emergency room if any of your symptoms worsen or new symptoms occur.   Recheck in 3 months.   Here is a link to information about the COVID-19 vaccine: http://www.harrington.info/   Keeping you healthy  Get these tests  Blood pressure- Have your blood pressure checked once a year by your healthcare provider.  Normal blood pressure is 120/80  Weight- Have your body mass index (BMI) calculated to screen for obesity.  BMI is a measure of body fat based on height and weight. You can also calculate your own BMI at ViewBanking.si.  Cholesterol- Have your cholesterol checked every year.  Diabetes- Have your blood sugar checked regularly if you have high blood pressure, high cholesterol, have a family history of diabetes or if you are overweight.  Screening for Colon Cancer- Colonoscopy starting at age 23.  Screening may begin sooner depending on your family history and other health conditions. Follow up colonoscopy as directed by your Gastroenterologist.  Screening for Prostate Cancer- Both blood work (PSA) and a rectal exam help screen for Prostate Cancer.  Screening begins at age 44 with African-American men and at age 13 with Caucasian men.  Screening may begin sooner depending on your family history.  Take these medicines  Aspirin- One aspirin daily can help prevent Heart disease and Stroke.  Flu shot- Every fall.  Tetanus- Every 10 years.  Zostavax- Once after the age of 21 to prevent Shingles.  Pneumonia shot- Once after the age of 63; if you are younger than 66, ask your healthcare provider if you need a Pneumonia shot.  Take these steps  Don't smoke- If you do smoke, talk to your doctor about quitting.  For tips on how to quit, go to  www.smokefree.gov or call 1-800-QUIT-NOW.  Be physically active- Exercise 5 days a week for at least 30 minutes.  If you are not already physically active start slow and gradually work up to 30 minutes of moderate physical activity.  Examples of moderate activity include walking briskly, mowing the yard, dancing, swimming, bicycling, etc.  Eat a healthy diet- Eat a variety of healthy food such as fruits, vegetables, low fat milk, low fat cheese, yogurt, lean meant, poultry, fish, beans, tofu, etc. For more information go to www.thenutritionsource.org  Drink alcohol in moderation- Limit alcohol intake to less than two drinks a day. Never drink and drive.  Dentist- Brush and floss twice daily; visit your dentist twice a year.  Depression- Your emotional health is as important as your physical health. If you're feeling down, or losing interest in things you would normally enjoy please talk to your healthcare provider.  Eye exam- Visit your eye doctor every year.  Safe sex- If you may be exposed to a sexually transmitted infection, use a condom.  Seat belts- Seat belts can save your life; always wear one.  Smoke/Carbon Monoxide detectors- These detectors need to be installed on the appropriate level of your home.  Replace batteries at least once a year.  Skin cancer- When out in the sun, cover up and use sunscreen 15 SPF or higher.  Violence- If anyone is threatening you, please tell your healthcare provider.  Living Will/ Health care power of attorney- Speak with your healthcare provider and family.    If you have  lab work done today you will be contacted with your lab results within the next 2 weeks.  If you have not heard from [place] then please contact us. The fastest way to get your results is to register for My Chart.   IF you received an x-ray today, you will receive an invoice from [facility]. Please contact [facility] at [phone] with questions or concerns  regarding your invoice.   IF you received labwork today, you will receive an invoice from [place]. Please contact [facility] at [phone] with questions or concerns regarding your invoice.   Our billing staff will not be able to assist you with questions regarding bills from these companies.  You will be contacted with the lab results as soon as they are available. The fastest way to get your results is to activate your My Chart account. Instructions are located on the last page of this paperwork. If you have not heard from [place] regarding the results in 2 weeks, please contact this office.

## 2019-10-24 LAB — LIPID PANEL
Chol/HDL Ratio: 2.3 ratio (ref 0.0–4.4)
Cholesterol, Total: 153 mg/dL (ref 100–199)
HDL: 67 mg/dL (ref >39)
LDL Chol Calc (NIH): 65 mg/dL (ref 0–99)
Triglycerides: 119 mg/dL (ref 0–149)
VLDL Cholesterol Cal: 21 mg/dL (ref 5–40)

## 2019-10-24 LAB — COMPREHENSIVE METABOLIC PANEL
ALT: 29 IU/L (ref 0–32)
AST: 19 IU/L (ref 0–40)
Albumin/Globulin Ratio: 2 (ref 1.2–2.2)
Albumin: 4.7 g/dL (ref 3.8–4.8)
Alkaline Phosphatase: 85 IU/L (ref 39–117)
BUN/Creatinine Ratio: 17 (ref 12–28)
BUN: 11 mg/dL (ref 8–27)
Bilirubin Total: <0.2 mg/dL (ref 0.0–1.2)
CO2: 28 mmol/L (ref 20–29)
Calcium: 9.8 mg/dL (ref 8.7–10.3)
Chloride: 100 mmol/L (ref 96–106)
Creatinine, Ser: 0.66 mg/dL (ref 0.57–1.00)
GFR calc Af Amer: 109 mL/min/{1.73_m2} (ref >59)
GFR calc non Af Amer: 94 mL/min/{1.73_m2} (ref >59)
Globulin, Total: 2.4 g/dL (ref 1.5–4.5)
Glucose: 77 mg/dL (ref 65–99)
Potassium: 5 mmol/L (ref 3.5–5.2)
Sodium: 140 mmol/L (ref 134–144)
Total Protein: 7.1 g/dL (ref 6.0–8.5)

## 2019-10-24 LAB — TSH: TSH: 1.09 u[IU]/mL (ref 0.450–4.500)

## 2019-10-25 ENCOUNTER — Encounter: Payer: Self-pay | Admitting: Family Medicine

## 2019-11-07 ENCOUNTER — Ambulatory Visit: Payer: Self-pay | Admitting: Internal Medicine

## 2019-11-13 ENCOUNTER — Other Ambulatory Visit: Payer: Self-pay

## 2019-11-13 ENCOUNTER — Ambulatory Visit (HOSPITAL_COMMUNITY)
Admission: RE | Admit: 2019-11-13 | Discharge: 2019-11-13 | Disposition: A | Discharge status: Home / Self Care | Payer: BC Managed Care – PPO | Source: Ambulatory Visit | Attending: Internal Medicine | Admitting: Internal Medicine

## 2019-11-13 ENCOUNTER — Other Ambulatory Visit: Payer: Self-pay | Admitting: Cardiovascular Disease

## 2019-11-13 DIAGNOSIS — I771 Stricture of artery: Secondary | ICD-10-CM

## 2019-11-13 DIAGNOSIS — I6523 Occlusion and stenosis of bilateral carotid arteries: Secondary | ICD-10-CM

## 2019-11-17 ENCOUNTER — Other Ambulatory Visit: Payer: Self-pay

## 2019-11-17 ENCOUNTER — Telehealth: Payer: Self-pay

## 2019-11-17 DIAGNOSIS — I6523 Occlusion and stenosis of bilateral carotid arteries: Secondary | ICD-10-CM

## 2019-11-17 NOTE — Telephone Encounter (Signed)
Called VM full/ unable to leave message.

## 2019-11-17 NOTE — Telephone Encounter (Signed)
-----   Message from [name], MD sent at 11/13/2019  4:11 PM EST ----- No change from prior study. Repeat in 12 months.

## 2019-11-25 ENCOUNTER — Other Ambulatory Visit: Payer: Self-pay | Admitting: Family Medicine

## 2019-11-25 DIAGNOSIS — J449 Chronic obstructive pulmonary disease, unspecified: Secondary | ICD-10-CM

## 2019-11-25 NOTE — Telephone Encounter (Signed)
Requested medication (s) are due for refill today: no  Requested medication (s) are on the active medication list:yes  Last refill:  10/23/2019  Future visit scheduled:no  Notes to clinic:   One inhaler should last at least one month. If the patient is requesting refills earlier, contact the patient to check for uncontrolled symptoms  Requested Prescriptions  Pending Prescriptions Disp Refills   VENTOLIN HFA 108 (90 Base) MCG/ACT inhaler [Pharmacy Med Name: VENTOLIN HFA 90 MCG INHALER]      Sig: INHALE 1-2 PUFFS INTO THE LUNGS EVERY 4 (FOUR) HOURS AS NEEDED FOR WHEEZING OR SHORTNESS OF BREATH.      Pulmonology:  Beta Agonists Failed - 11/25/2019  1:34 AM      Failed - One inhaler should last at least one month. If the patient is requesting refills earlier, contact the patient to check for uncontrolled symptoms.      Passed - Valid encounter within last 12 months    Recent Outpatient Visits           1 month ago Annual physical exam   [facility], Asencion Partridge, MD   10 months ago Cough   [facility], Asencion Partridge, MD   1 year ago Dizziness   [facility], Asencion Partridge, MD   1 year ago Chest pain, unspecified type   [facility], Asencion Partridge, MD   1 year ago Hyperlipidemia, unspecified hyperlipidemia type   [facility], Asencion Partridge, MD

## 2020-01-08 ENCOUNTER — Encounter: Payer: Self-pay | Admitting: Family Medicine

## 2020-02-23 ENCOUNTER — Other Ambulatory Visit: Payer: Self-pay | Admitting: Family Medicine

## 2020-02-23 DIAGNOSIS — Z1231 Encounter for screening mammogram for malignant neoplasm of breast: Secondary | ICD-10-CM

## 2020-02-24 ENCOUNTER — Other Ambulatory Visit: Payer: Self-pay | Admitting: Family Medicine

## 2020-02-24 DIAGNOSIS — J449 Chronic obstructive pulmonary disease, unspecified: Secondary | ICD-10-CM

## 2020-02-24 NOTE — Telephone Encounter (Signed)
Requested Prescriptions  Pending Prescriptions Disp Refills  . VENTOLIN HFA 108 (90 Base) MCG/ACT inhaler [Pharmacy Med Name: VENTOLIN HFA 90 MCG INHALER] 18 g 2    Sig: INHALE 1-2 PUFFS INTO THE LUNGS EVERY 4 (FOUR) HOURS AS NEEDED FOR WHEEZING OR SHORTNESS OF BREATH.     Pulmonology:  Beta Agonists Failed - 02/24/2020  1:57 AM      Failed - One inhaler should last at least one month. If the patient is requesting refills earlier, contact the patient to check for uncontrolled symptoms.      Passed - Valid encounter within last 12 months    Recent Outpatient Visits          4 months ago Annual physical exam   [facility], Asencion Partridge, MD   1 year ago Cough   [facility], Asencion Partridge, MD   1 year ago Dizziness   [facility], Asencion Partridge, MD   1 year ago Chest pain, unspecified type   [facility], Asencion Partridge, MD   1 year ago Hyperlipidemia, unspecified hyperlipidemia type   [facility], Asencion Partridge, MD

## 2020-05-02 ENCOUNTER — Other Ambulatory Visit: Payer: Self-pay | Admitting: Family Medicine

## 2020-05-02 DIAGNOSIS — J449 Chronic obstructive pulmonary disease, unspecified: Secondary | ICD-10-CM

## 2020-05-02 NOTE — Telephone Encounter (Signed)
Requested Prescriptions  Pending Prescriptions Disp Refills  . SPIRIVA HANDIHALER 18 MCG inhalation capsule [Pharmacy Med Name: SPIRIVA 18 MCG CP-HANDIHALER] 90 capsule 1    Sig: INHALE 1 CAPSULE VIA HANDIHALER ONCE DAILY AT THE SAME TIME EVERY DAY     Pulmonology:  Anticholinergic Agents Passed - 05/02/2020  1:14 AM      Passed - Valid encounter within last 12 months    Recent Outpatient Visits          6 months ago Annual physical exam   [facility], Asencion Partridge, MD   1 year ago Cough   [facility], Asencion Partridge, MD   1 year ago Dizziness   [facility], Asencion Partridge, MD   1 year ago Chest pain, unspecified type   [facility], Asencion Partridge, MD   1 year ago Hyperlipidemia, unspecified hyperlipidemia type   [facility], Asencion Partridge, MD

## 2020-07-31 ENCOUNTER — Other Ambulatory Visit: Payer: Self-pay | Admitting: Family Medicine

## 2020-07-31 DIAGNOSIS — G458 Other transient cerebral ischemic attacks and related syndromes: Secondary | ICD-10-CM

## 2020-07-31 DIAGNOSIS — E039 Hypothyroidism, unspecified: Secondary | ICD-10-CM

## 2020-07-31 DIAGNOSIS — E785 Hyperlipidemia, unspecified: Secondary | ICD-10-CM

## 2020-07-31 DIAGNOSIS — F418 Other specified anxiety disorders: Secondary | ICD-10-CM

## 2020-07-31 NOTE — Telephone Encounter (Signed)
Requested medications are due for refill today yes  Requested medications are on the active medication list yes   Last visit 10/2019  Future visit scheduled no  Notes to clinic Failed protocol of visit within 6 months

## 2020-08-02 ENCOUNTER — Other Ambulatory Visit: Payer: Self-pay

## 2020-08-18 ENCOUNTER — Ambulatory Visit: Payer: BC Managed Care – PPO | Admitting: Registered Nurse

## 2020-08-25 ENCOUNTER — Telehealth: Payer: Self-pay | Admitting: *Deleted

## 2020-08-25 NOTE — Telephone Encounter (Signed)
Mailbox full Schedule mammogram mobile unit

## 2020-08-27 ENCOUNTER — Other Ambulatory Visit: Payer: Self-pay | Admitting: Family Medicine

## 2020-08-27 DIAGNOSIS — F418 Other specified anxiety disorders: Secondary | ICD-10-CM

## 2020-08-27 NOTE — Telephone Encounter (Signed)
Requested Prescriptions  Pending Prescriptions Disp Refills  . FLUoxetine (PROZAC) 20 MG tablet [Pharmacy Med Name: FLUOXETINE HCL 20 MG TABLET] 70 tablet 0    Sig: TAKE 1 TABLET BY MOUTH EVERY DAY     Psychiatry:  Antidepressants - SSRI Failed - 08/27/2020 11:33 AM      Failed - Valid encounter within last 6 months    Recent Outpatient Visits          10 months ago Annual physical exam   [facility], Asencion Partridge, MD   1 year ago Cough   [facility], Asencion Partridge, MD   1 year ago Dizziness   [facility], Asencion Partridge, MD   1 year ago Chest pain, unspecified type   [facility], Asencion Partridge, MD   1 year ago Hyperlipidemia, unspecified hyperlipidemia type   [facility], Asencion Partridge, MD              Phone call to pt. To schedule f/u appt.  (was last seen 10/2019, and was to f/u in 3 mos.)  Pt. Stated she would like to schedule her annual physical.  Office is closed for the lunch hour.  Pt. Stated she will call back to schedule her appt. For January.  Will give a 2 mo. Supply at this time to cover pt. Until appt. In January.

## 2020-09-27 ENCOUNTER — Other Ambulatory Visit: Payer: Self-pay | Admitting: Family Medicine

## 2020-09-27 DIAGNOSIS — G458 Other transient cerebral ischemic attacks and related syndromes: Secondary | ICD-10-CM

## 2020-09-27 NOTE — Telephone Encounter (Signed)
Requested medication (s) are due for refill today:   Yes  Requested medication (s) are on the active medication list:   Yes  Future visit scheduled:   No   Last ordered: 08/02/2020 #30, 1 refill  Protocol failed because labs and an office visit are needed.     Requested Prescriptions  Pending Prescriptions Disp Refills   clopidogrel (PLAVIX) 75 MG tablet [Pharmacy Med Name: CLOPIDOGREL 75 MG TABLET] 30 tablet 1    Sig: TAKE 1 TABLET BY MOUTH EVERY DAY      Hematology: Antiplatelets - clopidogrel Failed - 09/27/2020  2:30 PM      Failed - Evaluate AST, ALT within 2 months of therapy initiation.      Failed - HCT in normal range and within 180 days    HCT  Date Value Ref Range Status  01/04/2019 42.1 36.0 - 46.0 % Final   Hematocrit  Date Value Ref Range Status  09/27/2017 39.8 34.0 - 46.6 % Final          Failed - HGB in normal range and within 180 days    Hemoglobin  Date Value Ref Range Status  01/04/2019 13.2 12.0 - 15.0 g/dL Final  29/47/6546 50.3 11.1 - 15.9 g/dL Final          Failed - PLT in normal range and within 180 days    Platelets  Date Value Ref Range Status  01/04/2019 275 150 - 400 K/uL Final  09/27/2017 263 150 - 379 x10E3/uL Final   Platelet Count, POC  Date Value Ref Range Status  01/02/2019 274 142 - 424 K/uL Final          Failed - Valid encounter within last 6 months    Recent Outpatient Visits           11 months ago Annual physical exam   [facility], Asencion Partridge, MD   1 year ago Cough   [facility], Asencion Partridge, MD   1 year ago Dizziness   [facility], Asencion Partridge, MD   2 years ago Chest pain, unspecified type   [facility], Asencion Partridge, MD   2 years ago Hyperlipidemia, unspecified hyperlipidemia type   [facility], Asencion Partridge, MD                Passed - ALT in normal range and within 360 days    ALT  Date Value Ref Range Status   10/23/2019 29 0 - 32 IU/L Final          Passed - AST in normal range and within 360 days    AST  Date Value Ref Range Status  10/23/2019 19 0 - 40 IU/L Final

## 2020-10-26 ENCOUNTER — Other Ambulatory Visit: Payer: Self-pay | Admitting: Family Medicine

## 2020-10-26 DIAGNOSIS — E039 Hypothyroidism, unspecified: Secondary | ICD-10-CM

## 2020-10-26 NOTE — Telephone Encounter (Signed)
Please schedule f/u appt and med refill. 30 day supply has been sent

## 2020-10-26 NOTE — Telephone Encounter (Signed)
10/26/2020 - FELICIA K. SENT IN A 30 DAY SUPPLY OF SYNTHROID 100 MCG. I CALLED THE PATIENT TO SCHEDULE AN OFFICE VISIT WITH DR. Neva Seat WITHIN THE NEXT 30 DAYS. SHE KEPT SAYING SHE DOES NOT NEED AN OFFICE VISIT SHE NEEDS A PHYSICAL. I TRIED TO EXPLAIN OUR SITUATION BUT SHE KEPT SAYING SHE DOES NOT NEED AN OFFICE VISIT SHE NEEDS A PHYSICAL. I WILL ROUTE BACK TO THE CLINICAL TEAM FOR REVIEW BECAUSE SHE  WOULD NOT LISTEN TO ME. MBC

## 2020-10-26 NOTE — Telephone Encounter (Signed)
No further refills without office visit 

## 2020-10-28 NOTE — Telephone Encounter (Signed)
Noted NO MORE REFILLS WILL BE SENT UNTIL SHE HAS A OV.

## 2020-10-29 ENCOUNTER — Other Ambulatory Visit: Payer: Self-pay | Admitting: Family Medicine

## 2020-10-29 DIAGNOSIS — J449 Chronic obstructive pulmonary disease, unspecified: Secondary | ICD-10-CM

## 2020-10-30 ENCOUNTER — Other Ambulatory Visit: Payer: Self-pay | Admitting: Family Medicine

## 2020-10-30 DIAGNOSIS — F418 Other specified anxiety disorders: Secondary | ICD-10-CM

## 2020-10-30 NOTE — Telephone Encounter (Signed)
Requested medications are due for refill today yes  Requested medications are on the active medication list yes  Last refill 11/12  Last visit 10/2019  Future visit scheduled no  Notes to clinic Failed protocol due to no valid visit within 6  months.

## 2020-11-01 NOTE — Telephone Encounter (Signed)
Needs appt. Has not been seen since Jan 2021. 3 month follow up discussed at that time. 30 day refill granted.

## 2020-11-01 NOTE — Telephone Encounter (Signed)
Let pt know he needs to sch a 3 month f/u for refills

## 2020-11-11 ENCOUNTER — Other Ambulatory Visit: Payer: Self-pay | Admitting: Family Medicine

## 2020-11-11 DIAGNOSIS — G458 Other transient cerebral ischemic attacks and related syndromes: Secondary | ICD-10-CM

## 2020-11-11 NOTE — Telephone Encounter (Signed)
Requested medication (s) are due for refill today: yes  Requested medication (s) are on the active medication list: yes  Last refill:  09/28/20 #30 with 1 refill  Future visit scheduled: no  Notes to clinic:  Please review for refill. Unable to fill per protocol. Patient due for labs and no future appt scheduled.     Requested Prescriptions  Pending Prescriptions Disp Refills   clopidogrel (PLAVIX) 75 MG tablet [Pharmacy Med Name: CLOPIDOGREL 75 MG TABLET] 30 tablet 1    Sig: TAKE 1 TABLET BY MOUTH EVERY DAY      Hematology: Antiplatelets - clopidogrel Failed - 11/11/2020 12:31 PM      Failed - Evaluate AST, ALT within 2 months of therapy initiation.      Failed - ALT in normal range and within 360 days    ALT  Date Value Ref Range Status  10/23/2019 29 0 - 32 IU/L Final          Failed - AST in normal range and within 360 days    AST  Date Value Ref Range Status  10/23/2019 19 0 - 40 IU/L Final          Failed - HCT in normal range and within 180 days    HCT  Date Value Ref Range Status  01/04/2019 42.1 36.0 - 46.0 % Final   Hematocrit  Date Value Ref Range Status  09/27/2017 39.8 34.0 - 46.6 % Final          Failed - HGB in normal range and within 180 days    Hemoglobin  Date Value Ref Range Status  01/04/2019 13.2 12.0 - 15.0 g/dL Final  25/02/3975 73.4 11.1 - 15.9 g/dL Final          Failed - PLT in normal range and within 180 days    Platelets  Date Value Ref Range Status  01/04/2019 275 150 - 400 K/uL Final  09/27/2017 263 150 - 379 x10E3/uL Final   Platelet Count, POC  Date Value Ref Range Status  01/02/2019 274 142 - 424 K/uL Final          Failed - Valid encounter within last 6 months    Recent Outpatient Visits           1 year ago Annual physical exam   [facility], Asencion Partridge, MD   1 year ago Cough   [facility], Asencion Partridge, MD   2 years ago Dizziness   [facility], Asencion Partridge, MD    2 years ago Chest pain, unspecified type   [facility], Asencion Partridge, MD   2 years ago Hyperlipidemia, unspecified hyperlipidemia type   [facility], Asencion Partridge, MD

## 2020-11-12 ENCOUNTER — Other Ambulatory Visit: Payer: Self-pay

## 2020-11-12 ENCOUNTER — Other Ambulatory Visit (HOSPITAL_COMMUNITY): Payer: Self-pay | Admitting: Cardiovascular Disease

## 2020-11-12 ENCOUNTER — Ambulatory Visit (HOSPITAL_COMMUNITY)
Admission: RE | Admit: 2020-11-12 | Discharge: 2020-11-12 | Disposition: A | Discharge status: Home / Self Care | Payer: BC Managed Care – PPO | Source: Ambulatory Visit | Attending: Internal Medicine | Admitting: Internal Medicine

## 2020-11-12 DIAGNOSIS — I771 Stricture of artery: Secondary | ICD-10-CM | POA: Diagnosis not present

## 2020-11-12 DIAGNOSIS — I6523 Occlusion and stenosis of bilateral carotid arteries: Secondary | ICD-10-CM | POA: Diagnosis not present

## 2020-11-12 DIAGNOSIS — Z95828 Presence of other vascular implants and grafts: Secondary | ICD-10-CM

## 2020-11-27 ENCOUNTER — Other Ambulatory Visit: Payer: Self-pay | Admitting: Family Medicine

## 2020-11-27 DIAGNOSIS — E785 Hyperlipidemia, unspecified: Secondary | ICD-10-CM

## 2020-12-01 ENCOUNTER — Other Ambulatory Visit: Payer: Self-pay | Admitting: Family Medicine

## 2020-12-01 DIAGNOSIS — E039 Hypothyroidism, unspecified: Secondary | ICD-10-CM

## 2020-12-01 DIAGNOSIS — F418 Other specified anxiety disorders: Secondary | ICD-10-CM

## 2020-12-11 ENCOUNTER — Other Ambulatory Visit: Payer: Self-pay | Admitting: Family Medicine

## 2020-12-11 DIAGNOSIS — G458 Other transient cerebral ischemic attacks and related syndromes: Secondary | ICD-10-CM

## 2020-12-11 NOTE — Telephone Encounter (Signed)
Requested Prescriptions  Pending Prescriptions Disp Refills  . clopidogrel (PLAVIX) 75 MG tablet [Pharmacy Med Name: CLOPIDOGREL 75 MG TABLET] 11 tablet 0    Sig: TAKE 1 TABLET BY MOUTH EVERY DAY     Hematology: Antiplatelets - clopidogrel Failed - 12/11/2020 12:32 PM      Failed - Evaluate AST, ALT within 2 months of therapy initiation.      Failed - ALT in normal range and within 360 days    ALT  Date Value Ref Range Status  10/23/2019 29 0 - 32 IU/L Final         Failed - AST in normal range and within 360 days    AST  Date Value Ref Range Status  10/23/2019 19 0 - 40 IU/L Final         Failed - HCT in normal range and within 180 days    HCT  Date Value Ref Range Status  01/04/2019 42.1 36.0 - 46.0 % Final   Hematocrit  Date Value Ref Range Status  09/27/2017 39.8 34.0 - 46.6 % Final         Failed - HGB in normal range and within 180 days    Hemoglobin  Date Value Ref Range Status  01/04/2019 13.2 12.0 - 15.0 g/dL Final  85/88/5027 74.1 11.1 - 15.9 g/dL Final         Failed - PLT in normal range and within 180 days    Platelets  Date Value Ref Range Status  01/04/2019 275 150 - 400 K/uL Final  09/27/2017 263 150 - 379 x10E3/uL Final   Platelet Count, POC  Date Value Ref Range Status  01/02/2019 274 142 - 424 K/uL Final         Failed - Valid encounter within last 6 months    Recent Outpatient Visits          1 year ago Annual physical exam   [facility], Asencion Partridge, MD   1 year ago Cough   [facility], Asencion Partridge, MD   2 years ago Dizziness   [facility], Asencion Partridge, MD   2 years ago Chest pain, unspecified type   [facility], Asencion Partridge, MD   2 years ago Hyperlipidemia, unspecified hyperlipidemia type   [facility], Asencion Partridge, MD      Future Appointments            In 1 week Neva Seat [facility], MD [facility], [facility]   In 3 weeks Lennette Bihari, MD [facility] [place], [facility]

## 2020-12-15 ENCOUNTER — Other Ambulatory Visit: Payer: Self-pay | Admitting: Family Medicine

## 2020-12-15 ENCOUNTER — Encounter: Payer: Self-pay | Admitting: Family Medicine

## 2020-12-15 DIAGNOSIS — F418 Other specified anxiety disorders: Secondary | ICD-10-CM

## 2020-12-15 DIAGNOSIS — E785 Hyperlipidemia, unspecified: Secondary | ICD-10-CM

## 2020-12-15 NOTE — Telephone Encounter (Signed)
Requested medications are due for refill today yes  Requested medications are on the active medication list yes  Last refill 1/17  Last visit 10/2019  Future visit scheduled 3/9  Notes to clinic already had a curtesy refill, however has appt next  week.

## 2020-12-22 ENCOUNTER — Encounter: Payer: Self-pay | Admitting: Family Medicine

## 2020-12-22 ENCOUNTER — Other Ambulatory Visit: Payer: Self-pay

## 2020-12-22 ENCOUNTER — Ambulatory Visit (INDEPENDENT_AMBULATORY_CARE_PROVIDER_SITE_OTHER): Payer: BC Managed Care – PPO | Admitting: Family Medicine

## 2020-12-22 DIAGNOSIS — F418 Other specified anxiety disorders: Secondary | ICD-10-CM

## 2020-12-22 DIAGNOSIS — E785 Hyperlipidemia, unspecified: Secondary | ICD-10-CM | POA: Diagnosis not present

## 2020-12-22 DIAGNOSIS — E039 Hypothyroidism, unspecified: Secondary | ICD-10-CM

## 2020-12-22 DIAGNOSIS — J449 Chronic obstructive pulmonary disease, unspecified: Secondary | ICD-10-CM

## 2020-12-22 DIAGNOSIS — G458 Other transient cerebral ischemic attacks and related syndromes: Secondary | ICD-10-CM | POA: Diagnosis not present

## 2020-12-22 MED ORDER — ALBUTEROL SULFATE HFA 108 (90 BASE) MCG/ACT IN AERS
INHALATION_SPRAY | RESPIRATORY_TRACT | 2 refills | Status: DC
Start: 2020-12-22 — End: 2022-07-03

## 2020-12-22 MED ORDER — SPIRIVA HANDIHALER 18 MCG IN CAPS
ORAL_CAPSULE | RESPIRATORY_TRACT | 2 refills | Status: DC
Start: 2020-12-22 — End: 2021-07-21

## 2020-12-22 MED ORDER — ATORVASTATIN CALCIUM 40 MG PO TABS: 40.0000 mg | ORAL_TABLET | Freq: Every day | ORAL | 2 refills | Status: DC

## 2020-12-22 MED ORDER — CLONAZEPAM 0.5 MG PO TABS: 0.5000 mg | ORAL_TABLET | Freq: Two times a day (BID) | ORAL | 1 refills | Status: DC | PRN

## 2020-12-22 MED ORDER — SYNTHROID 100 MCG PO TABS
100.0000 ug | ORAL_TABLET | Freq: Every day | ORAL | 2 refills | Status: DC
Start: 2020-12-22 — End: 2021-09-13

## 2020-12-22 MED ORDER — FLUOXETINE HCL 20 MG PO TABS: 20.0000 mg | ORAL_TABLET | Freq: Every day | ORAL | 2 refills | Status: DC

## 2020-12-22 MED ORDER — CLOPIDOGREL BISULFATE 75 MG PO TABS: 75.0000 mg | ORAL_TABLET | Freq: Every day | ORAL | 2 refills | Status: DC

## 2020-12-22 NOTE — Patient Instructions (Addendum)
  I do recommend the cancer screening we discussed today. Let me know if you change your mind or need a referral.   I will refill spiriva - recommend restarting. Albuterol if needed, but I do recommend follow up with pulmonary to discuss med regimen.  No other med changes - fasting lab visit tomorrow.   Return to the clinic or go to the nearest emergency room if any of your symptoms worsen or new symptoms occur.     If you have lab work done today you will be contacted with your lab results within the next 2 weeks.  If you have not heard from [place] then please contact us. The fastest way to get your results is to register for My Chart.   IF you received an x-ray today, you will receive an invoice from [facility]. Please contact [facility] at [phone] with questions or concerns regarding your invoice.   IF you received labwork today, you will receive an invoice from [place]. Please contact [facility] at [phone] with questions or concerns regarding your invoice.   Our billing staff will not be able to assist you with questions regarding bills from these companies.  You will be contacted with the lab results as soon as they are available. The fastest way to get your results is to activate your My Chart account. Instructions are located on the last page of this paperwork. If you have not heard from [place] regarding the results in 2 weeks, please contact this office.

## 2020-12-22 NOTE — Progress Notes (Signed)
Subjective:  Patient ID: Bethany Clarke, female    DOB: September 05, 1956  Age: 65 y.o. MRN: [id number]  CC:  Chief Complaint  Patient presents with  . Medication Refill    On chronic medications. Pt reports these medications work well for her with no side effects.    HPI Bethany Clarke presents for   Follow-up on chronic medications.  I last saw her in January 2021. 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   Depression: Prozac 20 mg daily, Klonopin intermittently in the past. Doing well - some days taking prozac - 1-2 per month or two. Feels like helps, out of klonopin. Last Rx in May 2020.   Depression screen [facility] 2/9 12/22/2020 10/23/2019 01/02/2019 09/24/2018 01/15/2018  Decreased Interest 0 0 0 3 0  Down, Depressed, Hopeless 0 0 0 3 0  PHQ - 2 Score 0 0 0 6 0  Altered sleeping - - - 3 -  Tired, decreased energy - - - 1 -  Change in appetite - - - 0 -  Feeling bad or failure about yourself  - - - 0 -  Trouble concentrating - - - 0 -  Moving slowly or fidgety/restless - - - 0 -  Suicidal thoughts - - - 0 -  PHQ-9 Score - - - 10 -  Difficult doing work/chores - - - Somewhat difficult -   Hypothyroidism: Lab Results  Component Value Date   TSH 1.090 10/23/2019  Synthroid 100 mcg daily- name brand only - variability in control on generic.  Taking medication daily.  No new hot or cold intolerance. No new hair or skin changes, heart palpitations or new fatigue. No new unexplained weight changes.   Hyperlipidemia: Lipitor 40 mg daily. Asa QD.  History of subclavian artery stenosis, carotid artery stenosis, cardiologist Dr. Allyson Sabal.  Plavix 75 mg daily. Carotid dopplers stable recently.  No bleeding.  appt with Dr. Tresa Endo in April.  BP 125/70 Lab Results  Component Value Date   CHOL 153 10/23/2019   HDL 67 10/23/2019   LDLCALC 65 10/23/2019   TRIG 119 10/23/2019   CHOLHDL 2.3 10/23/2019   Lab Results  Component Value Date   ALT 29 10/23/2019   AST 19  10/23/2019   ALKPHOS 85 10/23/2019   BILITOT <0.2 10/23/2019   COPD Previous treatment with Symbicort.  Stopped when discussing last year, does not want to be on inhaled steroid.  See previous notes.  Has used albuterol as needed in the past.  Quit smoking approximately 5 years ago.decreased control last January - spiriva ordered, recommended pulmonary follow up.  Trouble using spiriva initially. Used for about 8 months. No difference on meds or off med.  No recent flair of COPD, but wheezing with exertion. Albuterol available if needed - has used neb once per day during allergies. Last pulmonary eval 1.5 yrs ago? Dr. Sherene Sires. (last visit in 2017). 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 Usually 90-92. Declines oxygen.   Health maintenance: Colonoscopy, mammogram were discussed at her physical last year.  She wanted to wait on these until less Covid in the community.  Plan on scheduling Pap after Covid pandemic., and has declined HIV and hep C screening. Had covid vaccine and booster.  Declines colonoscopy, mammogram, and cervical cancer screen.    History Patient Active Problem List   Diagnosis Date Noted  . Chest pain, atypical 03/17/2016  . Paresthesia 07/14/2013  . Bilateral foot  pain 05/01/2013  . Subclavian artery stenosis, left (HCC)   . Subclavian steal syndrome 03/16/2013  . Subclavian arterial stenosis (HCC) 03/11/2013  . Carotid stenosis 03/11/2013  . Hyperlipidemia with target LDL less than 70 03/11/2013  . GERD (gastroesophageal reflux disease)   . Hypothyroid   . Dizziness   . Syncope, near   . Heart burn   . Hypothyroidism 05/28/2007  . COPD GOLD II with reversibility  05/28/2007   Past Medical History:  Diagnosis Date  . Atherosclerosis 11/14/2005   carotid US - mild calcific non-stenotic plaque bilaterally  . Carotid bruit 02/05/2009   Doppler - R/L ICAs 0-49% diameter reductin (velocities suggest low-mid range); L subclavian 0-49%   diameter reduction  . Chronic bronchitis (HCC)    "growing up; not anymore" (03/14/2013)  . Colon polyp    hyperplastic  . Costochondritis   . Dizziness   . Emphysema of lung (HCC)   . GERD (gastroesophageal reflux disease)   . H. pylori infection   . Heart burn   . Hemorrhoids   . Hypothyroid   . Lipidemia   . Pneumonia ~ 2009  . Subclavian artery stenosis, left (HCC)    subclavian steal physiology status post PTA and stenting  . Syncope, near    Past Surgical History:  Procedure Laterality Date  . ABDOMINAL HYSTERECTOMY  1999  . BILATERAL UPPER EXTREMITY ANGIOGRAM N/A 03/14/2013   Procedure: BILATERAL UPPER EXTREMITY ANGIOGRAM;  Surgeon: Runell GessJonathan J Berry, MD;  [facility];  Service: Cardiovascular;  Laterality: N/A;  . BREAST BIOPSY Right 1990's   "benign" (03/14/2013)  . CARDIAC CATHETERIZATION  03/03/2013  . DILATION AND CURETTAGE OF UTERUS  1980's  . LEFT HEART CATHETERIZATION WITH CORONARY ANGIOGRAM Bilateral 03/03/2013   Procedure: LEFT HEART CATHETERIZATION WITH CORONARY ANGIOGRAM;  Surgeon: Lennette Biharihomas A Kelly, MD;  [facility];  Service: Cardiovascular;  Laterality: Bilateral;  . PERCUTANEOUS STENT INTERVENTION  03/14/2013   Procedure: PERCUTANEOUS STENT INTERVENTION;  Surgeon: Runell GessJonathan J Berry, MD;  [facility];  Service: Cardiovascular;;  . SUBCLAVIAN ARTERY STENT Left 03/14/2013    for subclavian steal/claudication (03/14/2013)  . TUBAL LIGATION  1980's   No Known Allergies Prior to Admission medications   Medication Sig Start Date End Date Taking? Authorizing Provider  aspirin 81 MG tablet Take 81 mg by mouth daily.   Yes [provider]  atorvastatin (LIPITOR) 40 MG tablet TAKE 1 TABLET BY MOUTH EVERY DAY 11/27/20  Yes Shade FloodGreene, Milano Rosevear R, MD  clopidogrel (PLAVIX) 75 MG tablet TAKE 1 TABLET BY MOUTH EVERY DAY 12/11/20  Yes Shade FloodGreene, Jehan Ranganathan R, MD  FLUoxetine (PROZAC) 20 MG tablet TAKE 1 TABLET BY MOUTH EVERY DAY 12/15/20  Yes Shade FloodGreene, Myking Sar  R, MD  SYNTHROID 100 MCG tablet TAKE 1 TABLET BY MOUTH EVERY DAY BEFORE BREAKFAST 10/26/20  Yes Shade FloodGreene, Christain Niznik R, MD  VENTOLIN HFA 108 (90 Base) MCG/ACT inhaler INHALE 1-2 PUFFS INTO THE LUNGS EVERY 4 (FOUR) HOURS AS NEEDED FOR WHEEZING OR SHORTNESS OF BREATH. 02/24/20  Yes Shade FloodGreene, Malon Siddall R, MD  benzonatate (TESSALON) 100 MG capsule Take 1 capsule (100 mg total) by mouth 3 (three) times daily as needed for cough. Patient not taking: Reported on 12/22/2020 01/02/19   Shade FloodGreene, Medford Staheli R, MD  guaiFENesin-codeine 100-10 MG/5ML syrup Take 10 mLs by mouth every 6 (six) hours as needed for cough. Patient not taking: Reported on 12/22/2020 01/05/19   Geoffery Lyonselo, Douglas, MD  SPIRIVA HANDIHALER 18 MCG inhalation capsule INHALE 1 CAPSULE VIA  HANDIHALER ONCE DAILY AT THE SAME TIME EVERY DAY Patient not taking: Reported on 12/22/2020 10/29/20   [name], MD   Social History   Socioeconomic History  . Marital status: Married    Spouse name: Renae Fickle  . Number of children: 3  . Years of education: Not on file  . Highest education level: Some college, no degree  Occupational History  . Occupation: Orthoptist: [employer]  Tobacco Use  . Smoking status: Former Smoker    Packs/day: 0.50    Years: 40.00    Pack years: 20.00    Types: Cigarettes    Quit date: 02/11/2013    Years since quitting: 7.8  . Smokeless tobacco: Never Used  Substance and Sexual Activity  . Alcohol use: No  . Drug use: No  . Sexual activity: Yes    Comment: number of sex partners in the last 12 months  1  Other Topics Concern  . Not on file  Social History Narrative   Daily caffeine. Married. Education: [employer]. Exercise: No.      Patient is right-handed. She lives with her husband in a two level home. She drinks 2 cups of coffee a day. 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   Social Determinants of Health   Financial Resource Strain: Not on file  Food Insecurity: Not on file   Transportation Needs: Not on file  Physical Activity: Not on file  Stress: Not on file  Social Connections: Not on file  Intimate Partner Violence: Not on file    Review of Systems  Constitutional: Negative for fatigue and unexpected weight change.  Respiratory: Negative for chest tightness and shortness of breath.   Cardiovascular: Negative for chest pain, palpitations and leg swelling.  Gastrointestinal: Negative for abdominal pain and blood in stool.  Neurological: Negative for dizziness, syncope, light-headedness and headaches.    Objective:   Vitals:   12/22/20 1509  BP: 125/76  Pulse: 75  Temp: 98.2 F (36.8 C)  TempSrc: Temporal  SpO2: 92%  Weight: 189 lb (85.7 kg)  Height: 5\' 5"  (1.651 m)     Physical Exam Vitals reviewed.  Constitutional:      Appearance: She is well-developed and well-nourished.  HENT:     Head: Normocephalic and atraumatic.  Eyes:     Extraocular Movements: EOM normal.     Conjunctiva/sclera: Conjunctivae normal.     Pupils: Pupils are equal, round, and reactive to light.  Neck:     Vascular: No carotid bruit.  Cardiovascular:     Rate and Rhythm: Normal rate and regular rhythm.     Pulses: Intact distal pulses.     Heart sounds: Normal heart sounds.  Pulmonary:     Effort: Pulmonary effort is normal.     Breath sounds: Normal breath sounds.  Abdominal:     Palpations: Abdomen is soft. There is no pulsatile mass.     Tenderness: There is no abdominal tenderness.  Skin:    General: Skin is warm and dry.  Neurological:     General: No focal deficit present.     Mental Status: She is alert and oriented to person, place, and time.  Psychiatric:        Mood and Affect: Mood and affect normal.        Behavior: Behavior normal.     Assessment & Plan:  Bethany Clarke is a 65 y.o. female . Hyperlipidemia, unspecified hyperlipidemia type -  Plan: atorvastatin (LIPITOR) 40 MG tablet, Lipid panel, Comprehensive metabolic  panel  Subclavian steal syndrome - Plan: clopidogrel (PLAVIX) 75 MG tablet  Depression with anxiety - Plan: FLUoxetine (PROZAC) 20 MG tablet, clonazePAM (KLONOPIN) 0.5 MG tablet  Hypothyroidism, unspecified type - Plan: SYNTHROID 100 MCG tablet, TSH  Chronic obstructive pulmonary disease, unspecified COPD type (HCC) - Plan: tiotropium (SPIRIVA HANDIHALER) 18 MCG inhalation capsule, albuterol (VENTOLIN HFA) 108 (90 Base) MCG/ACT inhaler  Tolerating Lipitor for hyperlipidemia, and continued on Plavix with history of subclavian steal.  Continue routine follow-up with cardiology.  Depression/anxiety overall stable with fluoxetine, continue same with rare Klonopin need.  Refilled.  Did recommend restarting Spiriva for her COPD as going into allergy season, albuterol if needed.  Also recommended follow-up with pulmonary as could have combination of asthma with COPD given reversibility/improvement with albuterol.  Discussed the low O2 sat with certain activities and possible need for oxygen which was declined.  Can discuss this further with pulmonary.  Continue same dose Synthroid, check TSH for hypothyroidism.  Cancer screening discussed as above with declined testing at this time.  Meds ordered this encounter  Medications  . atorvastatin (LIPITOR) 40 MG tablet    Sig: Take 1 tablet (40 mg total) by mouth daily.    Dispense:  90 tablet    Refill:  2  . clopidogrel (PLAVIX) 75 MG tablet    Sig: Take 1 tablet (75 mg total) by mouth daily.    Dispense:  90 tablet    Refill:  2  . FLUoxetine (PROZAC) 20 MG tablet    Sig: Take 1 tablet (20 mg total) by mouth daily.    Dispense:  90 tablet    Refill:  2  . SYNTHROID 100 MCG tablet    Sig: Take 1 tablet (100 mcg total) by mouth daily before breakfast.    Dispense:  90 tablet    Refill:  2  . tiotropium (SPIRIVA HANDIHALER) 18 MCG inhalation capsule    Sig: INHALE 1 CAPSULE VIA HANDIHALER ONCE DAILY AT THE SAME TIME EVERY DAY    Dispense:   90 capsule    Refill:  2    .  . albuterol (VENTOLIN HFA) 108 (90 Base) MCG/ACT inhaler    Sig: INHALE 1-2 PUFFS INTO THE LUNGS EVERY 4 (FOUR) HOURS AS NEEDED FOR WHEEZING OR SHORTNESS OF BREATH.    Dispense:  18 g    Refill:  2  . clonazePAM (KLONOPIN) 0.5 MG tablet    Sig: Take 1 tablet (0.5 mg total) by mouth 2 (two) times daily as needed for anxiety.    Dispense:  20 tablet    Refill:  1   Patient Instructions    I do recommend the cancer screening we discussed today. Let me know if you change your mind or need a referral.   I will refill spiriva - recommend restarting. Albuterol if needed, but I do recommend follow up with pulmonary to discuss med regimen. Return to the clinic or go to the nearest emergency room if any of your symptoms worsen or new symptoms occur.     If you have lab work done today you will be contacted with your lab results within the next 2 weeks.  If you have not heard from [place] then please contact us. The fastest way to get your results is to register for My Chart.   IF you received an x-ray today, you will receive an invoice from [facility]. Please contact [facility]  at [phone] with questions or concerns regarding your invoice.   IF you received labwork today, you will receive an invoice from [place]. Please contact [facility] at [phone] with questions or concerns regarding your invoice.   Our billing staff will not be able to assist you with questions regarding bills from these companies.  You will be contacted with the lab results as soon as they are available. The fastest way to get your results is to activate your My Chart account. Instructions are located on the last page of this paperwork. If you have not heard from [place] regarding the results in 2 weeks, please contact this office.         Signed, Merri Ray, MD [facility]

## 2020-12-23 ENCOUNTER — Ambulatory Visit: Payer: BC Managed Care – PPO

## 2020-12-23 ENCOUNTER — Encounter: Payer: Self-pay | Admitting: Family Medicine

## 2020-12-23 DIAGNOSIS — E039 Hypothyroidism, unspecified: Secondary | ICD-10-CM | POA: Diagnosis not present

## 2020-12-23 DIAGNOSIS — E785 Hyperlipidemia, unspecified: Secondary | ICD-10-CM | POA: Diagnosis not present

## 2020-12-24 LAB — COMPREHENSIVE METABOLIC PANEL
ALT: 34 IU/L — ABNORMAL HIGH (ref 0–32)
AST: 22 IU/L (ref 0–40)
Albumin/Globulin Ratio: 1.8 (ref 1.2–2.2)
Albumin: 4.9 g/dL — ABNORMAL HIGH (ref 3.8–4.8)
Alkaline Phosphatase: 92 IU/L (ref 44–121)
BUN/Creatinine Ratio: 13 (ref 12–28)
BUN: 10 mg/dL (ref 8–27)
Bilirubin Total: 0.2 mg/dL (ref 0.0–1.2)
CO2: 27 mmol/L (ref 20–29)
Calcium: 10 mg/dL (ref 8.7–10.3)
Chloride: 100 mmol/L (ref 96–106)
Creatinine, Ser: 0.77 mg/dL (ref 0.57–1.00)
Globulin, Total: 2.8 g/dL (ref 1.5–4.5)
Glucose: 62 mg/dL — ABNORMAL LOW (ref 65–99)
Potassium: 4.4 mmol/L (ref 3.5–5.2)
Sodium: 141 mmol/L (ref 134–144)
Total Protein: 7.7 g/dL (ref 6.0–8.5)
eGFR: 86 mL/min/{1.73_m2} (ref >59)

## 2020-12-24 LAB — TSH: TSH: 0.866 u[IU]/mL (ref 0.450–4.500)

## 2020-12-24 LAB — LIPID PANEL
Chol/HDL Ratio: 2.4 ratio (ref 0.0–4.4)
Cholesterol, Total: 166 mg/dL (ref 100–199)
HDL: 69 mg/dL (ref >39)
LDL Chol Calc (NIH): 73 mg/dL (ref 0–99)
Triglycerides: 138 mg/dL (ref 0–149)
VLDL Cholesterol Cal: 24 mg/dL (ref 5–40)

## 2021-01-07 ENCOUNTER — Other Ambulatory Visit: Payer: Self-pay

## 2021-01-07 ENCOUNTER — Encounter: Payer: Self-pay | Admitting: Cardiovascular Disease

## 2021-01-07 ENCOUNTER — Ambulatory Visit (INDEPENDENT_AMBULATORY_CARE_PROVIDER_SITE_OTHER): Payer: BC Managed Care – PPO | Admitting: Cardiovascular Disease

## 2021-01-07 VITALS — BP 135/83 | HR 70 | Ht 65.0 in | Wt 190.6 lb

## 2021-01-07 DIAGNOSIS — I771 Stricture of artery: Secondary | ICD-10-CM

## 2021-01-07 DIAGNOSIS — E785 Hyperlipidemia, unspecified: Secondary | ICD-10-CM | POA: Diagnosis not present

## 2021-01-07 DIAGNOSIS — R0602 Shortness of breath: Secondary | ICD-10-CM | POA: Diagnosis not present

## 2021-01-07 DIAGNOSIS — I6523 Occlusion and stenosis of bilateral carotid arteries: Secondary | ICD-10-CM | POA: Diagnosis not present

## 2021-01-07 DIAGNOSIS — E039 Hypothyroidism, unspecified: Secondary | ICD-10-CM

## 2021-01-07 DIAGNOSIS — E669 Obesity, unspecified: Secondary | ICD-10-CM

## 2021-01-07 NOTE — Progress Notes (Signed)
Patient ID: Bethany Clarke, female   DOB: 10/12/1956, 65 y.o.   MRN: [id number]    HPI: Bethany Clarke is a 65 y.o. female who presents for a 35 month follow-up cardiology evaluation.   Bethany Clarke  has a history of hypothyroidism and hyperlipidemia.  In September 2013, she  had an endoscopy for epigastric discomfort and was treated with Nexium. She also was told to have sludge in her gallbladder and some inflammation of her bile duct, for which she has seen Dr. Henrene Pastor. The patient has a very strong family history for coronary artery disease with a sister, who underwent CABG surgery at age 16, as well as a father, who died at a young age with cancer but also had heart disease, and also many family members with stroke and heart disease. The patient had a 44 year history of tobacco use, having started at age 60. She has noticed recurrent episodes of epigastric and somewhat right-sided chest discomfort.  When I saw her back then  He complained of a progressive decline in activity with development of exertional shortness of breath  And also had awakened from sleep with chest pain.  She ultimately underwentcardiac catheterization was on Mar 03, 2013  Which demonstratednormal LV function without definite obstructive disease. Her left artery system was entirely normal. She did have mild ostial calcification of her right coronary artery without stenosis.   When I saw her initially on Feb 19, 2013 she had a 30 mm blood pressure differential between her right and left arm. Subsequent carotid duplex imaging suggested proximal left subclavian occlusion. She did have 0-49% stenosis in the right internal carotid artery and had progressive stenosis in her left mid internal carotid now showing a 50-69% diameter reduction. She had retrograde flow in her left vertebral artery suggestive of subclavian steal. In retrospect, she does admit to episodes of dizziness. Upper extremity duplex imaging revealed a 38 mm reduction in  left brachial artery pressure. A 2-D echo Doppler study showed ejection fraction of 55-60% with normal diastolic parameters and mildly thickened mitral valve leaflets appear.   I referred Bethany Clarke to Dr. Gwenlyn Found and she ultimately underwent aortic arch angiography and bilateral subclavian angiography which revealed a 99% proximal left subclavian artery stenosis with retrograde filling of the left vertebral system. Her subclavian artery was successfully stented.  She had marked improvement in her subclavian steal symptoms and her left upper extremity Doppler normalized.  She had stopped smoking.  She had gained weight.  Follow-up Doppler studies in December 2015 were not significantly changed since the year previously in the right brachial pressure was 143, and left brachial pressure 138 arguing against upper extremity inflow disease. Carotid studies in December 2015 showed mild plaque without high-grade stenosis.  Her left vertebral artery was not visualized.  Since I last saw her in September 2016, she had remained fairly stable.  She underwent a carotid Doppler study in January 2018 which showed heterogeneous plaque bilaterally.  There was stable 1-39% right internal carotid stenoses and stable 40-59% left internal carotid.  She had a patent left subclavian artery stent without evidence for restenosis.  She had a normal right subclavian artery, and patent vertebral arteries with antegrade flow.  She quit smoking 4 years ago.  She had gained weight after quitting smoking but in May started a ketone diet and has lost 40 pounds since May.  She has been active and walks on a treadmill and denies any exertionally precipitated chest tightness or significant  dyspnea.  Scheduling evening, she had experienced an episode of lower sternal mid epigastric discomfort which lasted approximately 5 minutes, but was associated with intense pain.  She had another episode of chest pain 2 days later which also was  nonexertional.  She had not experienced any chest tightness on walking 2 miles on a treadmill.    She has had some issues of right upper quadrant discomfort.  In the past.  She was noted to have sludge in her gallbladder. She is unaware of palpitations.  She has been taking atorvastatin 40 mg for hyperlipidemia.  She continues to be on aspirin and Plavix following her subclavian stent.  She also is on levothyroxine 100 g for hypothyroidism.   When I saw her in 2018, I recommended that she undergo a nuclear stress test and 2-D echo Doppler study for further evaluation of her symptoms and ECG changes.  I had empirically started her on metoprolol 12.5 mg twice a day.  She has felt well on this therapy.  She denies any recurrent episodes of chest tightness.  If she has experienced an episode of chest pain.  It has been nonexertional and lasts 15-30 seconds and is very fleeting and resolve spontaneously.  She denies any exertional dyspnea.  The echo Doppler study on 09/19/2017 showed an EF of 55-60%.  There was grade 2 diastolic dysfunction.  There was trivial AR and MR.  She had normal pulmonary pressures.  A nuclear stress test was low risk and showed an EF of 54%.  There was a small apical inferior defect without associated ischemia.  She had her baseline abnormal ECG changes.  She had a normal TID calculation.    I last saw her in April 2019 and since her prior evaluation she had  developed some mild lightheadedness and a sense of flushing.  She was evaluated her primary physician yesterday Dr. Cindee Lame who recommended discontinuance of metoprolol.  She had been on low dose at 12.5 mg twice a day.  Her last dose of metoprolol was yesterday morning.  She denies any chest tightness.  She denies any palpitations.  She denies significant dyspnea.  During her evaluation, her blood pressure was low and I suggested reduction with ultimate gradual weaning of beta-blocker therapy.  I have not seen her since 2019.   She continues to work at [employer].  She had quit tobacco approximately 6 years ago after a 35-year history of tobacco use.  Presently she is walking 2 miles per day.  She admits to some shortness of breath with activity but denies any chest pain.  She is unaware of palpitations.  She had undergone a carotid duplex study in January 2022 which showed mild bilateral plaque.  She had normal flow hemodynamics in her bilateral subclavian arteries with patent left subclavian stent.  She presents for reevaluation.   ALLERGIES: SHE DENIES ANY ALLERGIES.   Past Medical History:  Diagnosis Date  . Atherosclerosis 11/14/2005   carotid US - mild calcific non-stenotic plaque bilaterally  . Carotid bruit 02/05/2009   Doppler - R/L ICAs 0-49% diameter reductin (velocities suggest low-mid range); L subclavian 0-49%  diameter reduction  . Chronic bronchitis ([place])    "growing up; not anymore" (03/14/2013)  . Colon polyp    hyperplastic  . Costochondritis   . Dizziness   . Emphysema of lung ([place])   . GERD (gastroesophageal reflux disease)   . H. pylori infection   . Heart burn   . Hemorrhoids   .  Hypothyroid   . Lipidemia   . Pneumonia ~ 2009  . Subclavian artery stenosis, left (HCC)    subclavian steal physiology status post PTA and stenting  . Syncope, near     Past Surgical History:  Procedure Laterality Date  . ABDOMINAL HYSTERECTOMY  1999  . BILATERAL UPPER EXTREMITY ANGIOGRAM N/A 03/14/2013   Procedure: BILATERAL UPPER EXTREMITY ANGIOGRAM;  Surgeon: Lorretta Harp, MD;  Location: [facility] CATH LAB;  Service: Cardiovascular;  Laterality: N/A;  . BREAST BIOPSY Right 1990's   "benign" (03/14/2013)  . CARDIAC CATHETERIZATION  03/03/2013  . DILATION AND CURETTAGE OF UTERUS  1980's  . LEFT HEART CATHETERIZATION WITH CORONARY ANGIOGRAM Bilateral 03/03/2013   Procedure: LEFT HEART CATHETERIZATION WITH CORONARY ANGIOGRAM;  Surgeon: Troy Sine, MD;  Location: [facility] CATH LAB;  Service: Cardiovascular;   Laterality: Bilateral;  . PERCUTANEOUS STENT INTERVENTION  03/14/2013   Procedure: PERCUTANEOUS STENT INTERVENTION;  Surgeon: Lorretta Harp, MD;  Location: [facility] CATH LAB;  Service: Cardiovascular;;  . SUBCLAVIAN ARTERY STENT Left 03/14/2013    for subclavian steal/claudication (03/14/2013)  . TUBAL LIGATION  1980's    No Known Allergies  Current Outpatient Medications  Medication Sig Dispense Refill  . albuterol (VENTOLIN HFA) 108 (90 Base) MCG/ACT inhaler INHALE 1-2 PUFFS INTO THE LUNGS EVERY 4 (FOUR) HOURS AS NEEDED FOR WHEEZING OR SHORTNESS OF BREATH. 18 g 2  . aspirin 81 MG tablet Take 81 mg by mouth daily.    Marland Kitchen atorvastatin (LIPITOR) 40 MG tablet Take 1 tablet (40 mg total) by mouth daily. 90 tablet 2  . clonazePAM (KLONOPIN) 0.5 MG tablet Take 1 tablet (0.5 mg total) by mouth 2 (two) times daily as needed for anxiety. 20 tablet 1  . clopidogrel (PLAVIX) 75 MG tablet Take 1 tablet (75 mg total) by mouth daily. 90 tablet 2  . FLUoxetine (PROZAC) 20 MG tablet Take 1 tablet (20 mg total) by mouth daily. 90 tablet 2  . SYNTHROID 100 MCG tablet Take 1 tablet (100 mcg total) by mouth daily before breakfast. 90 tablet 2  . tiotropium (SPIRIVA HANDIHALER) 18 MCG inhalation capsule INHALE 1 CAPSULE VIA HANDIHALER ONCE DAILY AT THE SAME TIME EVERY DAY 90 capsule 2   Current Facility-Administered Medications  Medication Dose Route Frequency Provider Last Rate Last Admin  . ipratropium (ATROVENT) nebulizer solution 0.5 mg  0.5 mg Nebulization Once Wendie Agreste, MD        SOCIAL HISTORY: She is married, has three children. She works for [employer], who is also my patient, who recommended my name for the patient to see. The patient does not routinely exercise. There is no alcohol use. She had smoked for 44 years, and quit smoking in May 2014.  ROS General: Negative; No fevers, chills, or night sweats;  HEENT: Negative; No changes in vision or hearing, sinus congestion, difficulty  swallowing Pulmonary: Negative; No cough, wheezing, shortness of breath, hemoptysis Cardiovascular: see HPI GI:  Positive for transient right upper quadrant discomfort GU: Negative; No dysuria, hematuria, or difficulty voiding Musculoskeletal: Negative; no myalgias, joint pain, or weakness Hematologic/Oncology: Negative; no easy bruising, bleeding Endocrine: Negative; no heat/cold intolerance; no diabetes Neuro: Negative; no changes in balance, headaches Skin: Negative; No rashes or skin lesions Psychiatric: Negative; No behavioral problems, depression Sleep: Negative; No snoring, daytime sleepiness, hypersomnolence, bruxism, restless legs, hypnogognic hallucinations, no cataplexy Other comprehensive 14 point system review is negative.   PE BP 135/83   Pulse 70   Ht 5' 5"  (1.651 m)  Wt 190 lb 9.6 oz (86.5 kg)   SpO2 98%   BMI 31.72 kg/m    Repeat blood pressure by me was 130/78  Wt Readings from Last 3 Encounters:  01/07/21 190 lb 9.6 oz (86.5 kg)  12/22/20 189 lb (85.7 kg)  10/23/19 179 lb 12.8 oz (81.6 kg)   General: Alert, oriented, no distress.  Skin: normal turgor, no rashes, warm and dry HEENT: Normocephalic, atraumatic. Pupils equal round and reactive to light; sclera anicteric; extraocular muscles intact;  Nose without nasal septal hypertrophy Mouth/Parynx benign; Mallinpatti scale 3 Neck: No JVD, soft left carotid carotid bruit; normal carotid upstroke Lungs: clear to ausculatation and percussion; no wheezing or rales Chest wall: without tenderness to palpitation Heart: PMI not displaced, RRR, s1 s2 normal, 1/6 systolic murmur, no diastolic murmur, no rubs, gallops, thrills, or heaves Abdomen: soft, nontender; no hepatosplenomehaly, BS+; abdominal aorta nontender and not dilated by palpation. Back: no CVA tenderness Pulses 2+ Musculoskeletal: full range of motion, normal strength, no joint deformities Extremities: no clubbing cyanosis or edema, Homan's sign  negative  Neurologic: grossly nonfocal; Cranial nerves grossly wnl Psychologic: Normal mood and affect   ECG (independently read by me): NSR at 65; NSST changes  April 2019 ECG (independently read by me): Sinus rhythm at 71 bpm.  Nonspecific ST changes.  QTc interval 410 ms.  December 2018 ECG (independently read by me): Sinus bradycardia 53 bpm.  Nonspecific T-wave abnormality anteriorly.  November 2017 ECG (independently read by me): Normal sinus rhythm at 79 bpm.  Inferior and anterolateral ST-T changes which are more pronounced when compared to prior ECGs  September 2016 ECG (independently read by me):  Normal sinus rhythm at 64 bpm.  Nonspecific T changes anteriorly  May 2014 ECG: NSR at 63, anterior T wave abnormality  LABS: BMP Latest Ref Rng & Units 12/23/2020 10/23/2019 01/04/2019  Glucose 65 - 99 mg/dL 62(L) 77 113(H)  BUN 8 - 27 mg/dL 10 11 10   Creatinine 0.57 - 1.00 mg/dL 0.77 0.66 0.68  BUN/Creat Ratio 12 - 28 13 17  -  Sodium 134 - 144 mmol/L 141 140 137  Potassium 3.5 - 5.2 mmol/L 4.4 5.0 3.9  Chloride 96 - 106 mmol/L 100 100 100  CO2 20 - 29 mmol/L 27 28 29   Calcium 8.7 - 10.3 mg/dL 10.0 9.8 9.7   Hepatic Function Latest Ref Rng & Units 12/23/2020 10/23/2019 01/04/2019  Total Protein 6.0 - 8.5 g/dL 7.7 7.1 7.3  Albumin 3.8 - 4.8 g/dL 4.9(H) 4.7 4.3  AST 0 - 40 IU/L 22 19 27   ALT 0 - 32 IU/L 34(H) 29 41  Alk Phosphatase 44 - 121 IU/L 92 85 74  Total Bilirubin 0.0 - 1.2 mg/dL 0.2 <0.2 <0.1(L)  Bilirubin, Direct 0.0 - 0.3 mg/dL - - -   CBC Latest Ref Rng & Units 01/04/2019 01/02/2019 09/24/2018  WBC 4.0 - 10.5 K/uL 10.7(H) 4.7 4.0(A)  Hemoglobin 12.0 - 15.0 g/dL 13.2 13.8 13.7(A)  Hematocrit 36.0 - 46.0 % 42.1 40.1 40.0  Platelets 150 - 400 K/uL 275 - -   Lab Results  Component Value Date   MCV 101.4 (H) 01/04/2019   MCV 95.9 01/02/2019   MCV 98.7 09/24/2018   Lab Results  Component Value Date   TSH 0.866 12/23/2020   Lab Results  Component Value Date    HGBA1C 5.4 06/24/2013   Lipid Panel     Component Value Date/Time   CHOL 166 12/23/2020 1127   TRIG 138 12/23/2020 1127  HDL 69 12/23/2020 1127   CHOLHDL 2.4 12/23/2020 1127   CHOLHDL 2.5 12/01/2015 1712   VLDL 27 12/01/2015 1712   LDLCALC 73 12/23/2020 1127     RADIOLOGY: No results found.  IMPRESSION:  1. SOB (shortness of breath)   2. Bilateral carotid artery stenosis   3. Subclavian arterial stenosis (HCC)   4. Hyperlipidemia with target LDL less than 70   5. Hypothyroidism, unspecified type   6. Mild obesity     ASSESSMENT AND PLAN: [name] is a 28 -year-old female who has documented normal coronary arteries with the exception of ostial calcification of her right coronary artery by catheterization in May 2014.  She is status post stenting of her left subclavian artery in May 2014.  On a Doppler study in January 2018 she  was found to have 40-59% left internal carotid stenoses  She had a widely patent left subclavian artery stent without evidence for restenosis.  On Thanksgiving evening 2018 she experienced an episode of upper mid epigastric to low sternal discomfort which lasted 5 minutes and seem to radiate to her jaw.  She had another episode 2 days later.  She denies clear-cut exertional precipitation of this discomfort.  She empirically started on low-dose metoprolol at 12.5 mg twice a day and initially felt very well on this treatment. An echo Doppler data and striated normal systolic function with grade 2 diastolic dysfunction.  Her nuclear perfusion study showed fairly normal perfusion with the exception of a small apical inferior defect.  There is no associated ischemia.  I suspect this may be related to possible breast diaphragmatic attenuation.  She did not have any transient ischemic dilation.  She has not had any exertional symptoms. When I last saw her, her ECG showed improvement in her previous ST-T abnormalities.  When last seen she had experienced some possible  presyncopal episodes and ultimately her beta-blocker therapy was slowly weaned and discontinued.  I reviewed her most recent echo Doppler study from January 2022 which showed minimal wall thickening in the right carotid and velocities in the left carotid consistent with a 1 to 39% stenosis.  She had a widely patent left subclavian artery stent.  She is not having any anginal symptoms.  She continues to be on aspirin and Plavix.  She is on atorvastatin 40 mg for hyperlipidemia with target LDL less than 70.  She is on levothyroxine 100 mcg for hypothyroidism and is on albuterol and Spiriva for her lung disease.  She has experienced some shortness of breath with activity but continues to walk approximately 2 miles per day.  I am scheduling her for an echo Doppler evaluation to assess systolic and diastolic dysfunction.  She has gained weight from 165 pounds in 2019 290 pounds today.  Weight loss was recommended.  I reviewed recent laboratory from Dr. Cindee Lame her primary physician.  Lipid studies were excellent with a total cholesterol 166 triglycerides 138 HDL 69 LDL was 73, slightly increased from 1 year previously at 65.  I will see her in 6 months for reevaluation.   Troy Sine, MD, [facility]  01/09/2021 4:16 PM

## 2021-01-07 NOTE — Patient Instructions (Addendum)
Medication Instructions:  Continue current medications  *If you need a refill on your cardiac medications before your next appointment, please call your pharmacy*   Lab Work: None Ordered  Testing/Procedures: Your physician has requested that you have an echocardiogram. Echocardiography is a painless test that uses sound waves to create images of your heart. It provides your doctor with information about the size and shape of your heart and how well your heart's chambers and valves are working. This procedure takes approximately one hour. There are no restrictions for this procedure.   Follow-Up: At [facility], you and your health needs are our priority.  As part of our continuing mission to provide you with exceptional heart care, we have created designated Provider Care Teams.  These Care Teams include your primary Cardiologist (physician) and Advanced Practice Providers (APPs -  Physician Assistants and Nurse Practitioners) who all work together to provide you with the care you need, when you need it.  We recommend signing up for the patient portal called "MyChart".  Sign up information is provided on this After Visit Summary.  MyChart is used to connect with patients for Virtual Visits (Telemedicine).  Patients are able to view lab/test results, encounter notes, upcoming appointments, etc.  Non-urgent messages can be sent to your provider as well.   To learn more about what you can do with MyChart, go to [email or web address].    Your next appointment:   6 month(s)  The format for your next appointment:   In Person  Provider:   You may see Thomas,Kelly MD or one of the following Advanced Practice Providers on your designated Care Team:    Azalee Course, PA-C  Micah Flesher, PA-C or   Judy Pimple, [place]

## 2021-01-09 ENCOUNTER — Encounter: Payer: Self-pay | Admitting: Cardiovascular Disease

## 2021-02-08 ENCOUNTER — Encounter: Payer: Self-pay | Admitting: Family Medicine

## 2021-02-09 ENCOUNTER — Other Ambulatory Visit: Payer: Self-pay

## 2021-02-09 ENCOUNTER — Encounter: Payer: Self-pay | Admitting: Family Medicine

## 2021-02-09 ENCOUNTER — Ambulatory Visit (HOSPITAL_COMMUNITY): Payer: BC Managed Care – PPO | Attending: Internal Medicine

## 2021-02-09 DIAGNOSIS — R0602 Shortness of breath: Secondary | ICD-10-CM

## 2021-02-09 LAB — ECHOCARDIOGRAM COMPLETE
Area-P 1/2: 3.6 cm2
P 1/2 time: 634 msec
S' Lateral: 3.85 cm

## 2021-02-10 DIAGNOSIS — M25562 Pain in left knee: Secondary | ICD-10-CM | POA: Diagnosis not present

## 2021-05-26 ENCOUNTER — Encounter: Payer: Self-pay | Admitting: Neurology

## 2021-05-26 ENCOUNTER — Institutional Professional Consult (permissible substitution): Payer: BC Managed Care – PPO | Admitting: Neurology

## 2021-06-24 ENCOUNTER — Other Ambulatory Visit: Payer: Self-pay | Admitting: Family Medicine

## 2021-06-24 DIAGNOSIS — F418 Other specified anxiety disorders: Secondary | ICD-10-CM

## 2021-06-26 ENCOUNTER — Other Ambulatory Visit: Payer: Self-pay

## 2021-06-26 ENCOUNTER — Emergency Department
Admission: EM | Admit: 2021-06-26 | Discharge: 2021-06-26 | Disposition: A | Discharge status: Home / Self Care | Payer: BC Managed Care – PPO | Attending: Emergency Medicine | Admitting: Emergency Medicine

## 2021-06-26 ENCOUNTER — Encounter: Payer: Self-pay | Admitting: Emergency Medicine

## 2021-06-26 ENCOUNTER — Emergency Department: Payer: BC Managed Care – PPO

## 2021-06-26 DIAGNOSIS — Z87891 Personal history of nicotine dependence: Secondary | ICD-10-CM | POA: Diagnosis not present

## 2021-06-26 DIAGNOSIS — M25562 Pain in left knee: Secondary | ICD-10-CM

## 2021-06-26 DIAGNOSIS — Z79899 Other long term (current) drug therapy: Secondary | ICD-10-CM | POA: Insufficient documentation

## 2021-06-26 DIAGNOSIS — Z7982 Long term (current) use of aspirin: Secondary | ICD-10-CM | POA: Diagnosis not present

## 2021-06-26 DIAGNOSIS — J449 Chronic obstructive pulmonary disease, unspecified: Secondary | ICD-10-CM | POA: Diagnosis not present

## 2021-06-26 DIAGNOSIS — E039 Hypothyroidism, unspecified: Secondary | ICD-10-CM | POA: Insufficient documentation

## 2021-06-26 DIAGNOSIS — M79662 Pain in left lower leg: Secondary | ICD-10-CM | POA: Diagnosis not present

## 2021-06-26 DIAGNOSIS — W1839XA Other fall on same level, initial encounter: Secondary | ICD-10-CM | POA: Insufficient documentation

## 2021-06-26 DIAGNOSIS — Z7902 Long term (current) use of antithrombotics/antiplatelets: Secondary | ICD-10-CM | POA: Insufficient documentation

## 2021-06-26 DIAGNOSIS — W19XXXA Unspecified fall, initial encounter: Secondary | ICD-10-CM

## 2021-06-26 DIAGNOSIS — Y93K1 Activity, walking an animal: Secondary | ICD-10-CM | POA: Diagnosis not present

## 2021-06-26 MED ORDER — HYDROCODONE-ACETAMINOPHEN 5-325 MG PO TABS: 1.0000 | ORAL_TABLET | Freq: Three times a day (TID) | ORAL | 0 refills | Status: DC | PRN

## 2021-06-26 MED ORDER — HYDROCODONE-ACETAMINOPHEN 5-325 MG PO TABS
1.0000 | ORAL_TABLET | Freq: Once | ORAL | Status: AC
  Administered 2021-06-26: 1 via ORAL
  Filled 2021-06-26: qty 1

## 2021-06-26 NOTE — ED Triage Notes (Signed)
Pt reports was walking he dog and he pulled hard and she fell hurting her left knee. Pt reports thinks it is broke because she cannot put weight on it

## 2021-06-26 NOTE — ED Notes (Signed)
Rn to bedside. Pt fell walking her dog. Left knee pain. Painful to straighten or bear weight. Pt in no acute distress.

## 2021-06-26 NOTE — ED Provider Notes (Signed)
[facility] Emergency Department Provider Note ____________________________________________  Time seen: 1320  I have reviewed the triage vital signs and the nursing notes.  HISTORY  Chief Complaint  Fall and Knee Pain   HPI Bethany Clarke is a 65 y.o. female presents to the ER today with complaint of left knee pain.  She reports this started earlier today after a fall.  She reports she was walking her dog who yanked on the collar causing her to fall forward, landing on her knees.  She describes the pain as severe burning, aching and throbbing.  The pain radiates down into her left lower leg.  She denies numbness or tingling but is having some weakness and difficulty with ambulation.  She has not noticed any bruising or abrasion.  She denies prior knee injury or knee surgery.  She has not taken anything OTC for this.  Past Medical History:  Diagnosis Date   Atherosclerosis 11/14/2005   carotid US - mild calcific non-stenotic plaque bilaterally   Carotid bruit 02/05/2009   Doppler - R/L ICAs 0-49% diameter reductin (velocities suggest low-mid range); L subclavian 0-49%  diameter reduction   Chronic bronchitis (HCC)    "growing up; not anymore" (03/14/2013)   Colon polyp    hyperplastic   Costochondritis    Dizziness    Emphysema of lung (HCC)    GERD (gastroesophageal reflux disease)    H. pylori infection    Heart burn    Hemorrhoids    Hypothyroid    Lipidemia    Pneumonia ~ 2009   Subclavian artery stenosis, left (HCC)    subclavian steal physiology status post PTA and stenting   Syncope, near     Patient Active Problem List   Diagnosis Date Noted   Chest pain, atypical 03/17/2016   Paresthesia 07/14/2013   Bilateral foot pain 05/01/2013   Subclavian artery stenosis, left (HCC)    Subclavian steal syndrome 03/16/2013   Subclavian arterial stenosis (HCC) 03/11/2013   Carotid stenosis 03/11/2013   Hyperlipidemia with target LDL less than 70  03/11/2013   GERD (gastroesophageal reflux disease)    Hypothyroid    Dizziness    Syncope, near    Heart burn    Hypothyroidism 05/28/2007   COPD GOLD II with reversibility  05/28/2007    Past Surgical History:  Procedure Laterality Date   ABDOMINAL HYSTERECTOMY  1999   BILATERAL UPPER EXTREMITY ANGIOGRAM N/A 03/14/2013   Procedure: BILATERAL UPPER EXTREMITY ANGIOGRAM;  Surgeon: [name], MD;  Location: [facility] CATH LAB;  Service: Cardiovascular;  Laterality: N/A;   BREAST BIOPSY Right 1990's   "benign" (03/14/2013)   CARDIAC CATHETERIZATION  03/03/2013   DILATION AND CURETTAGE OF UTERUS  1980's   LEFT HEART CATHETERIZATION WITH CORONARY ANGIOGRAM Bilateral 03/03/2013   Procedure: LEFT HEART CATHETERIZATION WITH CORONARY ANGIOGRAM;  Surgeon: Lennette Bihari, MD;  Location: [facility] CATH LAB;  Service: Cardiovascular;  Laterality: Bilateral;   PERCUTANEOUS STENT INTERVENTION  03/14/2013   Procedure: PERCUTANEOUS STENT INTERVENTION;  Surgeon: [name], MD;  Location: [facility] CATH LAB;  Service: Cardiovascular;;   SUBCLAVIAN ARTERY STENT Left 03/14/2013    for subclavian steal/claudication (03/14/2013)   TUBAL LIGATION  1980's    Prior to Admission medications   Medication Sig Start Date End Date Taking? Authorizing Provider  HYDROcodone-acetaminophen (NORCO/VICODIN) 5-325 MG tablet Take 1 tablet by mouth every 8 (eight) hours as needed for moderate pain. 06/26/21 06/26/22 Yes Soniyah Mcglory, Salvadore Oxford, NP  albuterol (VENTOLIN HFA) 108 (  90 Base) MCG/ACT inhaler INHALE 1-2 PUFFS INTO THE LUNGS EVERY 4 (FOUR) HOURS AS NEEDED FOR WHEEZING OR SHORTNESS OF BREATH. 12/22/20   [name], MD  aspirin 81 MG tablet Take 81 mg by mouth daily.    [provider]  atorvastatin (LIPITOR) 40 MG tablet Take 1 tablet (40 mg total) by mouth daily. 12/22/20   [name], MD  clonazePAM (KLONOPIN) 0.5 MG tablet Take 1 tablet (0.5 mg total) by mouth 2 (two) times daily as needed for anxiety. 12/22/20    [name], MD  clopidogrel (PLAVIX) 75 MG tablet Take 1 tablet (75 mg total) by mouth daily. 12/22/20   [name], MD  FLUoxetine (PROZAC) 20 MG tablet Take 1 tablet (20 mg total) by mouth daily. 12/22/20   [name], MD  SYNTHROID 100 MCG tablet Take 1 tablet (100 mcg total) by mouth daily before breakfast. 12/22/20   [name], MD  tiotropium (SPIRIVA HANDIHALER) 18 MCG inhalation capsule INHALE 1 CAPSULE VIA HANDIHALER ONCE DAILY AT THE SAME TIME EVERY DAY 12/22/20   [name], MD    Allergies Patient has no known allergies.  Family History  Problem Relation Age of Onset   Cancer Brother        spine/lung   COPD Brother        smoked   Hypertension Brother    Rheum arthritis Brother    Hyperlipidemia Brother    Hypertension Brother    Diabetes Mother    Heart disease Mother    Stroke Mother    Cancer Father        cirrohosis   Hypertension Father    Heart disease Sister    Diabetes Sister    Hyperlipidemia Sister    Hypertension Sister    COPD Sister    Arthritis Sister        rheumatoid   Diabetes Sister    Arthritis Sister        rheumatoid   Colon cancer Neg Hx     Social History Social History   Tobacco Use   Smoking status: Former    Packs/day: 0.50    Years: 40.00    Pack years: 20.00    Types: Cigarettes    Quit date: 02/11/2013    Years since quitting: 8.3   Smokeless tobacco: Never  Substance Use Topics   Alcohol use: No   Drug use: No    Review of Systems  Constitutional: Negative for fever, chills or body aches. Cardiovascular: Negative for chest pain or chest tightness. Respiratory: Negative for cough or shortness of breath. Musculoskeletal: Positive for left knee pain, decreased range of motion and difficulty with gait.  Negative for joint swelling. Skin: Negative for bruising or abrasion. Neurological: Positive for focal weakness of the left lower extremity.  Negative for tingling or  numbness. ____________________________________________  PHYSICAL EXAM:  VITAL SIGNS: ED Triage Vitals  Enc Vitals Group     BP 06/26/21 1325 134/85     Pulse Rate 06/26/21 1325 78     Resp 06/26/21 1325 18     Temp 06/26/21 1325 97.9 F (36.6 C)     Temp Source 06/26/21 1325 Oral     SpO2 06/26/21 1325 92 %     Weight 06/26/21 1322 191 lb 12.8 oz (87 kg)     Height 06/26/21 1322 5\' 5"  (1.651 m)     Head Circumference --      Peak Flow --  Pain Score 06/26/21 1322 9     Pain Loc --      Pain Edu? --      Excl. in GC? --     Constitutional: Alert and oriented.  Appears in pain in no distress. Head: Normocephalic and atraumatic. Eyes: Marland Kitchen Normal extraocular movements Cardiovascular: Normal rate, regular rhythm.  Pedal pulses 2+ bilaterally. Respiratory: Normal respiratory effort. No wheezes/rales/rhonchi noted. Musculoskeletal: Decreased flexion and extension of the left knee secondary to pain.  Pain with palpation over the patella, left medial joint line and popliteal fossa.  No joint swelling noted.  Strength 4/5 LLE, 5/5 RLE. Neurologic:  Normal speech and language. No gross focal neurologic deficits are appreciated. Skin:  Skin is warm, dry and intact. No bruising or abrasion noted.  ____________________________________________   RADIOLOGY Imaging Orders         DG Knee Complete 4 Views Left    IMPRESSION: Negative.  ____________________________________________   INITIAL IMPRESSION / ASSESSMENT AND PLAN / ED COURSE  Acute Left Knee Pain s/p Fall:  DDx include knee strain, patellar fracture, knee contusion Xray left knee negative for acute fracture Ice applied Norco 5-325 mg PO x 1 in ER Knee immobilizer placed, crutches given RX for Norco 5-325 mg PO Q8H prn Will have her follow up with orthopedics as an outpatient      I reviewed the patient's prescription history over the last 12 months in the multi-state controlled substances database(s) that includes  [place], [place], [place], [place], [place], [place], [place], [place], [place], [place], [place], [place], [place], and [place].  Results were notable for no recent narcotic prescriptions. ____________________________________________  FINAL CLINICAL IMPRESSION(S) / ED DIAGNOSES  Final diagnoses:  Acute pain of left knee  Fall, initial encounter      Lorre Munroe, NP 06/26/21 1459    Minna Antis, MD 06/26/21 1531

## 2021-06-26 NOTE — Discharge Instructions (Addendum)
You were seen today for acute left knee pain status post fall.  Your x-ray did not show any evidence of acute fracture.  We recommend rest, ice and elevation.  Have given you a prescription for pain medication to take every 8 hours as needed.  You may apply ice for 10 minutes 3 times daily to help with pain and inflammation.  You may wear the knee immobilizer while up and ambulating, may take off while you are sleeping or in the shower.  Please follow-up with orthopedics if your pain persist.

## 2021-06-27 ENCOUNTER — Telehealth: Payer: Self-pay

## 2021-06-27 NOTE — Telephone Encounter (Signed)
Requesting:Klonopin 0.5mg  Contract: UDS: Last Visit:12/22/20 Next Visit:n/a Last Refill:12/22/20 20 tablets 1 refill  Please Advise

## 2021-06-27 NOTE — Telephone Encounter (Signed)
Requesting:Klonopin 0.5mg Contract: UDS: Last Visit:12/22/20 Next Visit:n/a Last Refill:12/22/20 20 tablets 1 refill  Please Advise  

## 2021-06-28 NOTE — Telephone Encounter (Signed)
Controlled substance database (PDMP) reviewed. No concerns appreciated.  Last filled 12/22/20, discussed at last visit. Refill ordered.

## 2021-06-28 NOTE — Telephone Encounter (Signed)
Refill ordered by refill request message.

## 2021-07-06 ENCOUNTER — Other Ambulatory Visit: Payer: Self-pay | Admitting: Student

## 2021-07-06 DIAGNOSIS — S83242A Other tear of medial meniscus, current injury, left knee, initial encounter: Secondary | ICD-10-CM

## 2021-07-06 DIAGNOSIS — M25462 Effusion, left knee: Secondary | ICD-10-CM

## 2021-07-06 DIAGNOSIS — M25562 Pain in left knee: Secondary | ICD-10-CM

## 2021-07-11 ENCOUNTER — Ambulatory Visit
Admission: RE | Admit: 2021-07-11 | Discharge: 2021-07-11 | Disposition: A | Discharge status: Home / Self Care | Payer: BC Managed Care – PPO | Source: Ambulatory Visit | Attending: Student | Admitting: Student

## 2021-07-11 ENCOUNTER — Other Ambulatory Visit: Payer: Self-pay

## 2021-07-11 DIAGNOSIS — M25462 Effusion, left knee: Secondary | ICD-10-CM | POA: Insufficient documentation

## 2021-07-11 DIAGNOSIS — M25562 Pain in left knee: Secondary | ICD-10-CM | POA: Diagnosis present

## 2021-07-11 DIAGNOSIS — S83242A Other tear of medial meniscus, current injury, left knee, initial encounter: Secondary | ICD-10-CM | POA: Diagnosis present

## 2021-07-18 ENCOUNTER — Other Ambulatory Visit: Payer: Self-pay | Admitting: Surgery

## 2021-07-21 ENCOUNTER — Ambulatory Visit: Payer: Medicare Other | Admitting: Anesthesiology

## 2021-07-21 ENCOUNTER — Encounter: Payer: Self-pay | Admitting: Surgery

## 2021-07-21 ENCOUNTER — Encounter
Admission: RE | Disposition: A | Discharge status: Home / Self Care | Payer: Self-pay | Source: Home / Self Care | Attending: Surgery

## 2021-07-21 ENCOUNTER — Ambulatory Visit
Admission: RE | Admit: 2021-07-21 | Discharge: 2021-07-21 | Disposition: A | Discharge status: Home / Self Care | Payer: Medicare Other | Attending: Surgery | Admitting: Surgery

## 2021-07-21 ENCOUNTER — Other Ambulatory Visit: Payer: Self-pay

## 2021-07-21 DIAGNOSIS — Z79899 Other long term (current) drug therapy: Secondary | ICD-10-CM | POA: Diagnosis not present

## 2021-07-21 DIAGNOSIS — Z87891 Personal history of nicotine dependence: Secondary | ICD-10-CM | POA: Diagnosis not present

## 2021-07-21 DIAGNOSIS — J449 Chronic obstructive pulmonary disease, unspecified: Secondary | ICD-10-CM | POA: Diagnosis not present

## 2021-07-21 DIAGNOSIS — X501XXA Overexertion from prolonged static or awkward postures, initial encounter: Secondary | ICD-10-CM | POA: Diagnosis not present

## 2021-07-21 DIAGNOSIS — Z7902 Long term (current) use of antithrombotics/antiplatelets: Secondary | ICD-10-CM | POA: Insufficient documentation

## 2021-07-21 DIAGNOSIS — S83272A Complex tear of lateral meniscus, current injury, left knee, initial encounter: Secondary | ICD-10-CM | POA: Insufficient documentation

## 2021-07-21 DIAGNOSIS — Z7982 Long term (current) use of aspirin: Secondary | ICD-10-CM | POA: Insufficient documentation

## 2021-07-21 DIAGNOSIS — I708 Atherosclerosis of other arteries: Secondary | ICD-10-CM | POA: Insufficient documentation

## 2021-07-21 DIAGNOSIS — Z79890 Hormone replacement therapy (postmenopausal): Secondary | ICD-10-CM | POA: Insufficient documentation

## 2021-07-21 DIAGNOSIS — Z95828 Presence of other vascular implants and grafts: Secondary | ICD-10-CM | POA: Diagnosis not present

## 2021-07-21 HISTORY — PX: KNEE ARTHROSCOPY WITH MENISCAL REPAIR: SHX5653

## 2021-07-21 SURGERY — ARTHROSCOPY, KNEE, WITH MENISCUS REPAIR
Anesthesia: General | Site: Knee | Laterality: Left

## 2021-07-21 MED ORDER — LIDOCAINE HCL (PF) 1 % IJ SOLN
INTRAMUSCULAR | Status: AC
  Filled 2021-07-21: qty 30

## 2021-07-21 MED ORDER — SODIUM CHLORIDE 0.9 % IV SOLN
INTRAVENOUS | Status: DC | PRN
  Administered 2021-07-21: 25 ug/min via INTRAVENOUS

## 2021-07-21 MED ORDER — BUPIVACAINE-EPINEPHRINE (PF) 0.5% -1:200000 IJ SOLN
INTRAMUSCULAR | Status: DC | PRN
  Administered 2021-07-21: 30 mL
  Administered 2021-07-21: 20 mL

## 2021-07-21 MED ORDER — BUPIVACAINE-EPINEPHRINE (PF) 0.5% -1:200000 IJ SOLN
INTRAMUSCULAR | Status: AC
  Filled 2021-07-21: qty 60

## 2021-07-21 MED ORDER — ONDANSETRON HCL 4 MG/2ML IJ SOLN
INTRAMUSCULAR | Status: DC | PRN
Start: 2021-07-21 — End: 2021-07-21
  Administered 2021-07-21: 4 mg via INTRAVENOUS

## 2021-07-21 MED ORDER — GLYCOPYRROLATE 0.2 MG/ML IJ SOLN
INTRAMUSCULAR | Status: DC | PRN
Start: 2021-07-21 — End: 2021-07-21
  Administered 2021-07-21: .2 mg via INTRAVENOUS

## 2021-07-21 MED ORDER — FENTANYL CITRATE (PF) 100 MCG/2ML IJ SOLN: 25.0000 ug | INTRAMUSCULAR | Status: DC | PRN

## 2021-07-21 MED ORDER — FENTANYL CITRATE (PF) 100 MCG/2ML IJ SOLN
INTRAMUSCULAR | Status: DC | PRN
  Administered 2021-07-21 (×2): 25 ug via INTRAVENOUS
  Administered 2021-07-21: 50 ug via INTRAVENOUS

## 2021-07-21 MED ORDER — ORAL CARE MOUTH RINSE: 15.0000 mL | Freq: Once | OROMUCOSAL | Status: AC

## 2021-07-21 MED ORDER — LACTATED RINGERS IR SOLN
Status: DC | PRN
  Administered 2021-07-21: 6000 mL

## 2021-07-21 MED ORDER — ACETAMINOPHEN 10 MG/ML IV SOLN
INTRAVENOUS | Status: DC | PRN
  Administered 2021-07-21: 1000 mg via INTRAVENOUS

## 2021-07-21 MED ORDER — LACTATED RINGERS IV SOLN: INTRAVENOUS | Status: DC

## 2021-07-21 MED ORDER — IBUPROFEN 200 MG PO TABS: 800.0000 mg | ORAL_TABLET | Freq: Four times a day (QID) | ORAL | 0 refills | Status: DC | PRN

## 2021-07-21 MED ORDER — MIDAZOLAM HCL 2 MG/2ML IJ SOLN
INTRAMUSCULAR | Status: DC | PRN
  Administered 2021-07-21: 2 mg via INTRAVENOUS

## 2021-07-21 MED ORDER — DEXMEDETOMIDINE (PRECEDEX) IN NS 20 MCG/5ML (4 MCG/ML) IV SYRINGE
PREFILLED_SYRINGE | INTRAVENOUS | Status: DC | PRN
  Administered 2021-07-21: 8 ug via INTRAVENOUS
  Administered 2021-07-21: 12 ug via INTRAVENOUS

## 2021-07-21 MED ORDER — EPHEDRINE SULFATE 50 MG/ML IJ SOLN
INTRAMUSCULAR | Status: DC | PRN
Start: 2021-07-21 — End: 2021-07-21
  Administered 2021-07-21 (×2): 10 mg via INTRAVENOUS

## 2021-07-21 MED ORDER — CHLORHEXIDINE GLUCONATE 0.12 % MT SOLN
OROMUCOSAL | Status: AC
  Filled 2021-07-21: qty 15

## 2021-07-21 MED ORDER — MIDAZOLAM HCL 2 MG/2ML IJ SOLN
INTRAMUSCULAR | Status: AC
  Filled 2021-07-21: qty 2

## 2021-07-21 MED ORDER — LIDOCAINE HCL (CARDIAC) PF 100 MG/5ML IV SOSY
PREFILLED_SYRINGE | INTRAVENOUS | Status: DC | PRN
  Administered 2021-07-21: 100 mg via INTRAVENOUS

## 2021-07-21 MED ORDER — VASOPRESSIN 20 UNIT/ML IV SOLN
INTRAVENOUS | Status: DC | PRN
  Administered 2021-07-21 (×2): 1 [IU] via INTRAVENOUS

## 2021-07-21 MED ORDER — HYDROCODONE-ACETAMINOPHEN 5-325 MG PO TABS: 1.0000 | ORAL_TABLET | Freq: Four times a day (QID) | ORAL | 0 refills | Status: DC | PRN

## 2021-07-21 MED ORDER — FENTANYL CITRATE (PF) 100 MCG/2ML IJ SOLN
INTRAMUSCULAR | Status: AC
  Filled 2021-07-21: qty 2

## 2021-07-21 MED ORDER — CEFAZOLIN SODIUM-DEXTROSE 2-4 GM/100ML-% IV SOLN
INTRAVENOUS | Status: AC
  Filled 2021-07-21: qty 100

## 2021-07-21 MED ORDER — ACETAMINOPHEN 10 MG/ML IV SOLN: 1000.0000 mg | Freq: Once | INTRAVENOUS | Status: DC | PRN

## 2021-07-21 MED ORDER — LIDOCAINE HCL 1 % IJ SOLN
INTRAMUSCULAR | Status: DC | PRN
  Administered 2021-07-21: 30 mL

## 2021-07-21 MED ORDER — CHLORHEXIDINE GLUCONATE 0.12 % MT SOLN
15.0000 mL | Freq: Once | OROMUCOSAL | Status: AC
  Administered 2021-07-21: 15 mL via OROMUCOSAL

## 2021-07-21 MED ORDER — PROPOFOL 10 MG/ML IV BOLUS
INTRAVENOUS | Status: DC | PRN
  Administered 2021-07-21: 50 mg via INTRAVENOUS
  Administered 2021-07-21: 150 mg via INTRAVENOUS

## 2021-07-21 MED ORDER — OXYCODONE HCL 5 MG PO TABS
ORAL_TABLET | ORAL | Status: AC
  Filled 2021-07-21: qty 1

## 2021-07-21 MED ORDER — 0.9 % SODIUM CHLORIDE (POUR BTL) OPTIME
TOPICAL | Status: DC | PRN
  Administered 2021-07-21: 10 mL

## 2021-07-21 MED ORDER — CEFAZOLIN SODIUM-DEXTROSE 2-4 GM/100ML-% IV SOLN
2.0000 g | INTRAVENOUS | Status: AC
  Administered 2021-07-21: 2 g via INTRAVENOUS

## 2021-07-21 MED ORDER — ONDANSETRON HCL 4 MG/2ML IJ SOLN: 4.0000 mg | Freq: Once | INTRAMUSCULAR | Status: DC | PRN

## 2021-07-21 MED ORDER — OXYCODONE HCL 5 MG PO TABS
5.0000 mg | ORAL_TABLET | Freq: Once | ORAL | Status: AC | PRN
  Administered 2021-07-21: 5 mg via ORAL

## 2021-07-21 MED ORDER — OXYCODONE HCL 5 MG/5ML PO SOLN: 5.0000 mg | Freq: Once | ORAL | Status: AC | PRN

## 2021-07-21 MED ORDER — ACETAMINOPHEN 10 MG/ML IV SOLN
INTRAVENOUS | Status: AC
  Filled 2021-07-21: qty 100

## 2021-07-21 MED ORDER — DEXAMETHASONE SODIUM PHOSPHATE 10 MG/ML IJ SOLN
INTRAMUSCULAR | Status: DC | PRN
  Administered 2021-07-21: 10 mg via INTRAVENOUS

## 2021-07-21 MED ORDER — PHENYLEPHRINE HCL (PRESSORS) 10 MG/ML IV SOLN
INTRAVENOUS | Status: DC | PRN
  Administered 2021-07-21: 100 ug via INTRAVENOUS

## 2021-07-21 SURGICAL SUPPLY — 52 items
ANCH SUT 4.5 FTPRNT PEEK-OPTM (Anchor) ×1 IMPLANT
ANCH SUT BLU WHT HS FBR (SUTURE) ×3
ANCHOR 4.5 FOOTPRINT ULTRA (Anchor) ×1 IMPLANT
APL PRP STRL LF DISP 70% ISPRP (MISCELLANEOUS) ×1
BAG COUNTER SPONGE SURGICOUNT (BAG) IMPLANT
BAG SPNG CNTER NS LX DISP (BAG)
BIT DRILL 4X4.5 FOOTPRINT STR (BIT) IMPLANT
BLADE FULL RADIUS 3.5 (BLADE) ×2 IMPLANT
BLADE SHAVER 4.5X7 STR FR (MISCELLANEOUS) ×2 IMPLANT
BNDG ELASTIC 6X5.8 VLCR STR LF (GAUZE/BANDAGES/DRESSINGS) ×2 IMPLANT
BNDG ESMARK 6X12 TAN STRL LF (GAUZE/BANDAGES/DRESSINGS) ×2 IMPLANT
BRACE KNEE POST OP SHORT (BRACE) ×1 IMPLANT
BUR ABRADER 4.0 W/FLUTE AQUA (MISCELLANEOUS) IMPLANT
BURR ABRADER 4.0 W/FLUTE AQUA (MISCELLANEOUS) ×2
CHLORAPREP W/TINT 26 (MISCELLANEOUS) ×2 IMPLANT
CUFF TOURN SGL QUICK 24 (TOURNIQUET CUFF)
CUFF TOURN SGL QUICK 34 (TOURNIQUET CUFF)
CUFF TRNQT CYL 24X4X16.5-23 (TOURNIQUET CUFF) IMPLANT
CUFF TRNQT CYL 34X4.125X (TOURNIQUET CUFF) IMPLANT
DRAPE IMP U-DRAPE 54X76 (DRAPES) ×2 IMPLANT
DRILL 4X4.5 FOOTPRINT STR (BIT) ×2
ELECT REM PT RETURN 9FT ADLT (ELECTROSURGICAL) ×2
ELECTRODE REM PT RTRN 9FT ADLT (ELECTROSURGICAL) ×1 IMPLANT
GAUZE SPONGE 4X4 12PLY STRL (GAUZE/BANDAGES/DRESSINGS) ×2 IMPLANT
GLOVE SURG ENC MOIS LTX SZ8 (GLOVE) ×4 IMPLANT
GLOVE SURG ENC TEXT LTX SZ7 (GLOVE) ×4 IMPLANT
GLOVE SURG UNDER LTX SZ8 (GLOVE) ×2 IMPLANT
GLOVE SURG UNDER POLY LF SZ7.5 (GLOVE) ×2 IMPLANT
GOWN STRL REUS W/ TWL LRG LVL3 (GOWN DISPOSABLE) ×1 IMPLANT
GOWN STRL REUS W/ TWL XL LVL3 (GOWN DISPOSABLE) ×2 IMPLANT
GOWN STRL REUS W/TWL LRG LVL3 (GOWN DISPOSABLE) ×2
GOWN STRL REUS W/TWL XL LVL3 (GOWN DISPOSABLE) ×4
IV LACTATED RINGER IRRG 3000ML (IV SOLUTION) ×4
IV LR IRRIG 3000ML ARTHROMATIC (IV SOLUTION) ×1 IMPLANT
KIT MENISCAL ROOT REPAIR (KITS) ×1 IMPLANT
KIT TURNOVER KIT A (KITS) ×2 IMPLANT
MANIFOLD NEPTUNE II (INSTRUMENTS) ×4 IMPLANT
NDL HYPO 21X1.5 SAFETY (NEEDLE) ×1 IMPLANT
NEEDLE HYPO 21X1.5 SAFETY (NEEDLE) ×2 IMPLANT
PACK ARTHROSCOPY KNEE (MISCELLANEOUS) ×2 IMPLANT
PASSER SUT FASTPASS MINI (KITS) ×1 IMPLANT
PENCIL ELECTRO HAND CTR (MISCELLANEOUS) ×2 IMPLANT
SPONGE T-LAP 18X18 ~~LOC~~+RFID (SPONGE) ×2 IMPLANT
SUT PROLENE 4 0 PS 2 18 (SUTURE) ×2 IMPLANT
SUT TICRON COATED BLUE 2 0 30 (SUTURE) IMPLANT
SUT ULTRALOOP WHITE/BLUE (SUTURE) ×3 IMPLANT
SYR 50ML LL SCALE MARK (SYRINGE) ×2 IMPLANT
TUBING INFLOW SET DBFLO PUMP (TUBING) ×2 IMPLANT
WAND WEREWOLF FLOW 90D (MISCELLANEOUS) ×2 IMPLANT
WATER STERILE IRR 500ML POUR (IV SOLUTION) ×1 IMPLANT
arthroscopy sheet with pouch ×1 IMPLANT
minitape loop ×3 IMPLANT

## 2021-07-21 NOTE — Anesthesia Preprocedure Evaluation (Addendum)
Anesthesia Evaluation  Patient identified by MRN, date of birth, ID band Patient awake    Reviewed: Allergy & Precautions, NPO status , Patient's Chart, lab work & pertinent test results  History of Anesthesia Complications Negative for: history of anesthetic complications  Airway Mallampati: III   Neck ROM: Full    Dental  (+) Upper Dentures, Lower Dentures   Pulmonary COPD, former smoker (quit 2014),    Pulmonary exam normal breath sounds clear to auscultation       Cardiovascular + Peripheral Vascular Disease (subclavian artery stenosis s/p stent on Plavix, last dose 07/14/21)  Normal cardiovascular exam Rhythm:Regular Rate:Normal  ECG 01/07/21: NSR, ST and T wave abnormality   Neuro/Psych PSYCHIATRIC DISORDERS Anxiety Depression negative neurological ROS     GI/Hepatic GERD  ,  Endo/Other  Hypothyroidism   Renal/GU negative Renal ROS     Musculoskeletal   Abdominal   Peds  Hematology negative hematology ROS (+)   Anesthesia Other Findings   Reproductive/Obstetrics                            Anesthesia Physical Anesthesia Plan  ASA: 3  Anesthesia Plan: General   Post-op Pain Management:    Induction: Intravenous  PONV Risk Score and Plan: 3 and Ondansetron, Dexamethasone and Treatment may vary due to age or medical condition  Airway Management Planned: LMA  Additional Equipment:   Intra-op Plan:   Post-operative Plan: Extubation in OR  Informed Consent: I have reviewed the patients History and Physical, chart, labs and discussed the procedure including the risks, benefits and alternatives for the proposed anesthesia with the patient or authorized representative who has indicated his/her understanding and acceptance.       Plan Discussed with: CRNA  Anesthesia Plan Comments: (Patient has full set of dentures and refuses to remove them.  Discussed with Dr. Joice Lofts and  patient; we agree to proceed if the patient takes full responsibility for any complications associated with keeping dentures in.  These complications include, but are not limited to, damage to dentures, loss of dentures, injury to oral and perioral area, injury to airway, aspiration, pneumonia, infection, bleeding, death.  Patient agrees and wishes to proceed.)       Anesthesia Quick Evaluation

## 2021-07-21 NOTE — OR Nursing (Signed)
Patient states she "refuses to remove her dentures for surgery today".  Dr Ronni Rumble with anesthesia at bedside for interview and extensive explanation about concerns for safety with leaving dentures in place during surgery. Patient continues to refuse to remove them states "I dont care what you say, I will not remove them. I will get up and walk out of here before I take them out".  Dr. Ronni Rumble noted interview and plan to continue surgery.  Patient verbally agrees to all responsibility for dentures in place during surgery and any adverse event explained by anesthesiology.

## 2021-07-21 NOTE — Anesthesia Postprocedure Evaluation (Signed)
Anesthesia Post Note  Patient: Bethany Clarke  Procedure(s) Performed: Arthroscopic debridement, arthroscopic debridement of bucket-handle component of lateral meniscus tear, and arthroscopically-assisted repair of lateral meniscus root tear, left knee.  (Left: Knee)  Patient location during evaluation: PACU Anesthesia Type: General Level of consciousness: awake and alert, oriented and patient cooperative Pain management: pain level controlled Vital Signs Assessment: post-procedure vital signs reviewed and stable Respiratory status: spontaneous breathing, nonlabored ventilation and respiratory function stable Cardiovascular status: blood pressure returned to baseline and stable Postop Assessment: adequate PO intake Anesthetic complications: no   No notable events documented.   Last Vitals:  Vitals:   07/21/21 1315 07/21/21 1330  BP: 127/75 140/82  Pulse: 84 86  Resp: 16 18  Temp: 36.7 C (!) 36.2 C  SpO2: 95% 95%    Last Pain:  Vitals:   07/21/21 1330  TempSrc: Temporal  PainSc: 4                  Reed Breech

## 2021-07-21 NOTE — Op Note (Signed)
07/21/2021  12:34 PM  Patient:   Bethany Clarke  Pre-Op Diagnosis:   Complex lateral meniscus root tear, left knee.  Postoperative diagnosis:   Complex lateral meniscus tear with lateral meniscus root tear, left knee.  Procedure:   Arthroscopic debridement, arthroscopic debridement of bucket-handle component of lateral meniscus tear, and arthroscopically-assisted repair of lateral meniscus root tear, left knee.  Surgeon:   Maryagnes Amos, M.D.  Assistant:   Horris Latino, PA-C  Anesthesia:   General LMA.  Findings:   As above.  There was a complex tear of the lateral meniscus with a bucket-handle component as well as a tear of the posterior root.  The medial meniscus was in satisfactory condition, as were the anterior and posterior cruciate ligaments.  The articular surfaces of the femur, tibia, and patella all were in satisfactory condition.  Complications:   None.  EBL:   5 cc.  Total fluids:   900 cc of crystalloid.  Tourniquet time:   None  Drains:   None  Closure:   Staples.  Brief clinical note:   The patient is a 65 year old female who sustained the above-noted injury to her left knee 1 month ago when her dog pulled her suddenly while the leash was wrapped around her legs, causing her to fall to the ground. Her symptoms have persisted despite medications, activity modification, etc. The patient's history and examination were consistent with a lateral meniscus tear. An MRI scan demonstrated the presence of a complex lateral meniscus root tear. The patient presents at this time for arthroscopy, debridement, and repair versus partial lateral meniscectomy.  Procedure:   The patient was brought into the operating room and lain in the supine position. After adequate general laryngeal mask anesthesia was obtained, a timeout was performed to verify the appropriate side. The patient's left knee was injected sterilely using a solution of 30 cc of 1% lidocaine and 30 cc of 0.5%  Sensorcaine with epinephrine. The left lower extremity was prepped with ChloraPrep solution before being draped sterilely. Preoperative antibiotics were administered. The expected portal sites were injected with 0.5% Sensorcaine with epinephrine before the camera was placed in the anterolateral portal and instrumentation performed through the anteromedial portal. The knee was sequentially examined beginning in the suprapatellar pouch, then progressing to the patellofemoral space, the medial gutter compartment, the notch, and finally the lateral compartment and gutter. The findings were as described above. Abundant reactive synovial tissues anteriorly were debrided using the full-radius resector in order to improve visualization.   The lateral meniscus was carefully probed and demonstrated the findings as described above. The bucket-handle component of the tear was debrided using the full-radius resector and side-biting baskets back to stable margins as confirmed by probing. Probing also demonstrated an unstable tear of the meniscal root. This was repaired using the [facility] meniscal root repair system. The end of the tear was freshened with a full-radius resector before the attachment site on the proximal tibia was curetted and debrided with the full-radius resector to expose good bleeding bone. The [facility] guide was positioned in the over-the-top position and, utilizing the 55 degree angle setting, the drill/sleeve combination was drilled up into the proximal tibia through a short anterior incision. Once its position was verified intra-articularly, the central drill was removed, leaving the sleeve in place. Utilizing the FirstPass suture passer, two 1.3 mm Smith [employer] Nephew suture tapes were passed through the meniscal root and brought back through a loop at the other end  of each suture to create a luggage stitch. A looped passing suture was placed up through the retained sleeve and pulled out through  the anteromedial wound. Using the passing loop, the FiberWire was drawn down through the drill hole in the proximal tibia and brought out anteriorly. A single Smith [employer] Nephew 4.5 mm Ultra Peek anchor was placed in the anterior tibial cortex to secure the sutures. The repair was assessed and found to be stable to probing. It also appeared to be stable with range of motion of the knee. The instruments were removed from the joint after suctioning the excess fluid.   The subcutaneous tissues in the anterior wound were reapproximated in two layers using 2-0 Vicryl interrupted sutures before the skin was closed using 4-0 Prolene interrupted sutures. The portal sites also were closed using 4-0 Prolene interrupted sutures. A sterile bulky dressing was applied to the knee before the patient was placed into a hinged knee brace with the hinges set at 0-90, but locked in extension. The patient was then awakened, extubated, and returned to the recovery room in satisfactory condition after tolerating the procedure well.

## 2021-07-21 NOTE — Anesthesia Procedure Notes (Signed)
Procedure Name: LMA Insertion Date/Time: 07/21/2021 10:57 AM Performed by: Mohammed Kindle, CRNA Pre-anesthesia Checklist: Patient identified, Emergency Drugs available, Suction available and Patient being monitored Patient Re-evaluated:Patient Re-evaluated prior to induction Oxygen Delivery Method: Circle system utilized Preoxygenation: Pre-oxygenation with 100% oxygen Induction Type: IV induction Ventilation: Mask ventilation without difficulty LMA: LMA inserted LMA Size: 4.0 Placement Confirmation: positive ETCO2, CO2 detector and breath sounds checked- equal and bilateral Tube secured with: Tape Dental Injury: Teeth and Oropharynx as per pre-operative assessment

## 2021-07-21 NOTE — Transfer of Care (Signed)
Immediate Anesthesia Transfer of Care Note  Patient: Bethany Clarke  Procedure(s) Performed: Arthroscopic debridement, arthroscopic debridement of bucket-handle component of lateral meniscus tear, and arthroscopically-assisted repair of lateral meniscus root tear, left knee.  (Left: Knee)  Patient Location: PACU  Anesthesia Type:General  Level of Consciousness: drowsy and patient cooperative  Airway & Oxygen Therapy: Patient Spontanous Breathing and Patient connected to face mask oxygen  Post-op Assessment: Report given to RN and Post -op Vital signs reviewed and stable  Post vital signs: Reviewed and stable  Last Vitals:  Vitals Value Taken Time  BP 106/65 07/21/21 1247  Temp 36.2 C 07/21/21 1247  Pulse 78 07/21/21 1257  Resp 14 07/21/21 1257  SpO2 97 % 07/21/21 1257  Vitals shown include unvalidated device data.  Last Pain:  Vitals:   07/21/21 1247  TempSrc:   PainSc: Asleep         Complications: No notable events documented.

## 2021-07-21 NOTE — H&P (Signed)
History of Present Illness: Bethany Clarke is a 65 y.o. female who presents today for repeat evaluation of ongoing left knee pain. The patient was evaluated on 07/05/2021. At this appointment the patient did undergo a left knee aspiration which yielded clear synovial fluid however because of a twisting knee injury and effusion after suffering a twisting the injury the patient was sent for MRI scan and presents today to discuss the results. The patient is still experiencing moderate discomfort in the left knee. She states that initially the fluid withdrawal did provide some relief however shortly after being evaluated she states that the left knee began to swell again without any recent trauma or injury. She is able to weight-bear to the left lower extremity however she has increased discomfort when attempting to fully weight-bear and is only applying partial weightbearing to the left lower extremity. She reports moderate pain along the lateral, posterior aspect of the knee. She denies any surgical history to the left knee. She denies any numbness or tingling to left lower extremity. She does report a sharp significant pain when attempting to twist the left knee on occasion. She does take Plavix routinely, she has a history of subclavian arterial stenosis. The patient has stopped her Plavix in anticipation of possible upcoming surgery. The patient denies any personal history of heart attack, stroke, asthma blood clot. She does have a history of COPD and does use a inhaler as needed.  Past Medical History:  Chickenpox   COPD (chronic obstructive pulmonary disease) (CMS-HCC)   Past Surgical History:  HYSTERECTOMY   Stent   Past Family History:  Diabetes Mother   Stroke Mother   Liver disease Father   Diabetes Sister   COPD Sister   Heart disease Sister   Lung cancer Brother   Medications:  acetaminophen (TYLENOL) 500 MG tablet Take 1,500 mg by mouth as needed for Pain   albuterol (PROVENTIL) 2.5  mg /3 mL (0.083 %) nebulizer solution albuterol sulfate 2.5 mg/3 mL (0.083 %) solution for nebulization TAKE BY NEBULIZER EVERY 4 HOURS AS NEEDED FOR WHEEZING OR SHORTNESS OF BREATH   aspirin 81 MG EC tablet Take 81 mg by mouth once daily   atorvastatin (LIPITOR) 40 MG tablet Take 40 mg by mouth once daily   clonazePAM (KLONOPIN) 0.5 MG tablet Take 1 tablet by mouth as needed   clopidogreL (PLAVIX) 75 mg tablet Take 75 mg by mouth once daily   FLUoxetine (PROZAC) 20 MG tablet Take 20 mg by mouth every other day   HYDROcodone-acetaminophen (NORCO) 5-325 mg tablet Take 1 tablet by mouth every 8 (eight) hours as needed for Pain 15 tablet 0   ibuprofen (MOTRIN) 200 MG tablet 200 mg 4-5 tablets as needed   SYNTHROID 100 mcg tablet TAKE 1 TABLET BY MOUTH DAILY BEFORE BREAKFAST.   VENTOLIN HFA 90 mcg/actuation inhaler INHALE 1-2 PUFFS INTO THE LUNGS EVERY 4 HOURS AS NEEDED FOR WHEEZING OR SHORTNESS OF BREATH.   Allergies: No Known Allergies   Review of Systems:  A comprehensive 14 point ROS was performed, reviewed by me today, and the pertinent orthopaedic findings are documented in the HPI.  Physical Exam: BP 136/86  Ht 165.1 cm (5\' 5" )  Wt 82.5 kg (181 lb 12.8 oz)  BMI 30.25 kg/m  General/Constitutional: The patient appears to be well-nourished, well-developed, and in no acute distress. Neuro/Psych: Normal mood and affect, oriented to person, place and time. Eyes: Non-icteric. Pupils are equal, round, and reactive to light, and exhibit synchronous  movement. ENT: Unremarkable. Lymphatic: No palpable adenopathy. Respiratory: Lungs clear to auscultation, Normal chest excursion, No wheezes and Non-labored breathing Cardiovascular: Regular rate and rhythm. No murmurs. and No edema, swelling or tenderness, except as noted in detailed exam. Integumentary: No impressive skin lesions present, except as noted in detailed exam. Musculoskeletal: Unremarkable, except as noted in detailed  exam.  General: Well developed, well nourished 65 y.o. female in no apparent distress. Normal affect. Normal communication. Patient answers questions appropriately. The patient has a moderate limp favoring the left leg.  Left Lower Extremity: Examination of the left lower extremity reveals no bony abnormality, no edema, moderate left knee effusion and no ecchymosis. There is mild valgus abnormality. Tenderness with palpation along the medial and lateral joint line of the left knee. Patient reports moderate tenderness with palpation along the posterior lateral joint line of the left knee. The patient does have increased pain when extending the left knee however with assistance there is no evidence of a locked need exam. She is able to perform a straight leg raise. The patient is able to flex 90 degrees with mild-moderate discomfort. Positive McMurray's test the left knee. There is no retropatellar discomfort. The patient has a negative patella stretch test. The patient has a negative varus stress test and a negative valgus stress test, in looking for stability. The patient has a negative Lachman's test.  Vascular: The patient has a negative Denna Haggard' test bilaterally. The patient had a normal dorsalis pedis and posterior tibial pulse. There is normal skin warmth. There is normal capillary refill bilaterally.   Neurologic: The patient has a negative straight leg raise. The patient has normal muscle strength testing for the quadriceps, calves, ankle dorsiflexion, ankle plantarflexion, and extensor hallicus longus. The patient has sensation that is intact to light touch. The deep tendon reflexes are normal at the patella and achilles. No clonus is noted.   MRI OF THE LEFT KNEE:  1. Complex tearing of the lateral meniscal body and posterior horn.  The posterior root attachment appears to be torn.  2. Possible subtle tear along the periphery of the medial meniscal  posterior horn.  3. Grade 1 sprains of  the MCL and fibular collateral ligament.  4. Moderate-large knee joint effusion.   Impression: 1. Lateral meniscus of left knee. 2. Possible medial meniscus tear of left knee.  Plan:  1. Treatment options were discussed today with the patient. 2. The MRI report and images were discussed today in detail with the patient. 3. The patient was instructed on the risk and benefits of surgical versus nonsurgical treatment options at this time. 4. The patient would like to proceed with surgical intervention. The patient will be scheduled for a left knee arthroscopy with possible medial meniscectomy and lateral meniscectomy versus meniscal root repair. 5. Surgery will be scheduled with Dr. Joice Lofts for this Thursday. This document will serve as a surgical history and physical. She was instructed to remain off of her Plavix at this time until surgery. 6. The patient will follow-up per standard postop protocol. She can contact the clinic if she has any questions, new symptoms develop or symptoms worsen.  The procedure was discussed with the patient, as were the potential risks (including bleeding, infection, nerve and/or blood vessel injury, persistent or recurrent pain, failure of the repair, progression of arthritis, need for further surgery, blood clots, strokes, heart attacks and/or arhythmias, pneumonia, etc.) and benefits. The patient states her understanding and wishes to proceed.   H&P reviewed and patient  re-examined. No changes.

## 2021-07-21 NOTE — Discharge Instructions (Addendum)
Orthopedic discharge instructions: Keep dressing dry and intact.  Keep knee brace on at all times except may remove for bathing purposes. May sponge bathe after dressing changed on post-op day #4 (Sunday).  Cover staples with Band-Aids after drying off, then reapply the brace. Apply ice frequently to knee or use Polar Care. Take ibuprofen 600-800 mg TID with meals for 7-10 days, then as necessary. Take hydrocodone as prescribed or ES Tylenol if necessary. No weightbearing on left leg - use walker or crutches for ambulation. Follow-up in 10-14 days or as scheduled.   AMBULATORY SURGERY  DISCHARGE INSTRUCTIONS   The drugs that you were given will stay in your system until tomorrow so for the next 24 hours you should not:  Drive an automobile Make any legal decisions Drink any alcoholic beverage   You may resume regular meals tomorrow.  Today it is better to start with liquids and gradually work up to solid foods.  You may eat anything you prefer, but it is better to start with liquids, then soup and crackers, and gradually work up to solid foods.   Please notify your doctor immediately if you have any unusual bleeding, trouble breathing, redness and pain at the surgery site, drainage, fever, or pain not relieved by medication.    Your post-operative visit with Dr.                                       is: Date:                        Time:    Please call to schedule your post-operative visit.  Additional Instructions:

## 2021-07-22 ENCOUNTER — Encounter: Payer: Self-pay | Admitting: Surgery

## 2021-07-29 ENCOUNTER — Other Ambulatory Visit (HOSPITAL_BASED_OUTPATIENT_CLINIC_OR_DEPARTMENT_OTHER): Payer: Self-pay | Admitting: Cardiovascular Disease

## 2021-07-29 DIAGNOSIS — Z95828 Presence of other vascular implants and grafts: Secondary | ICD-10-CM

## 2021-08-22 ENCOUNTER — Encounter: Payer: Self-pay | Admitting: Registered Nurse

## 2021-08-22 ENCOUNTER — Ambulatory Visit (INDEPENDENT_AMBULATORY_CARE_PROVIDER_SITE_OTHER): Payer: Medicare Other | Admitting: Registered Nurse

## 2021-08-22 ENCOUNTER — Other Ambulatory Visit: Payer: Self-pay

## 2021-08-22 VITALS — BP 129/79 | HR 80 | Temp 98.1°F | Resp 17 | Ht 65.0 in | Wt 171.2 lb

## 2021-08-22 DIAGNOSIS — J441 Chronic obstructive pulmonary disease with (acute) exacerbation: Secondary | ICD-10-CM | POA: Diagnosis not present

## 2021-08-22 MED ORDER — HYDROCODONE BIT-HOMATROP MBR 5-1.5 MG/5ML PO SOLN
5.0000 mL | Freq: Three times a day (TID) | ORAL | 0 refills | Status: DC | PRN
Start: 2021-08-22 — End: 2021-08-31

## 2021-08-22 MED ORDER — AZITHROMYCIN 250 MG PO TABS: ORAL_TABLET | ORAL | 0 refills | Status: AC

## 2021-08-22 MED ORDER — PREDNISONE 10 MG (21) PO TBPK: ORAL_TABLET | ORAL | 0 refills | Status: DC

## 2021-08-22 MED ORDER — DM-GUAIFENESIN ER 30-600 MG PO TB12: 1.0000 | ORAL_TABLET | Freq: Two times a day (BID) | ORAL | 0 refills | Status: DC

## 2021-08-22 NOTE — Patient Instructions (Addendum)
Ms. Bethany -   [name] to meet you! Sorry youre not feeling well  I have sent zpack and prednisone to knock this out  Hycodan syrup and mucinex dm for symptom relief  Use hycodan with caution  Call on W or Th if no improvement/worsening  ER if suddenly and drastically worsening  Thank you  [name]     If you have lab work done today you will be contacted with your lab results within the next 2 weeks.  If you have not heard from [place] then please contact us. The fastest way to get your results is to register for My Chart.   IF you received an x-ray today, you will receive an invoice from [facility]. Please contact [facility] at [phone] with questions or concerns regarding your invoice.   IF you received labwork today, you will receive an invoice from [facility]. Please contact [facility] at [phone] with questions or concerns regarding your invoice.   Our billing staff will not be able to assist you with questions regarding bills from these companies.  You will be contacted with the lab results as soon as they are available. The fastest way to get your results is to activate your My Chart account. Instructions are located on the last page of this paperwork. If you have not heard from [place] regarding the results in 2 weeks, please contact this office.

## 2021-08-22 NOTE — Progress Notes (Signed)
Established Patient Office Visit  Subjective:  Patient ID: Bethany Clarke, female    DOB: 11-22-1955  Age: 65 y.o. MRN: [id number]  CC:  Chief Complaint  Patient presents with   Cough    Patient states since last Thursday she was sick with a very bad cough that has made her stomach hurt. Pt states she has been having trouble sleeping and seems like she has got this from work. She is very fatigue as well.    HPI Bethany Clarke presents for cough  Onset last Thursday Multiple sick contacts at work - flu and COVID  Seen at Urgent Care last week, given benadryl and tessalon.  These were ineffective.  Endorses malaise, cough, myalgias, stress incontinence Sleep is disturbed by cough Cough is not productive Headaches - cough worsens Visual changes - mild dizziness  Past Medical History:  Diagnosis Date   Atherosclerosis 11/14/2005   carotid US - mild calcific non-stenotic plaque bilaterally   Carotid bruit 02/05/2009   Doppler - R/L ICAs 0-49% diameter reductin (velocities suggest low-mid range); L subclavian 0-49%  diameter reduction   Chronic bronchitis (HCC)    "growing up; not anymore" (03/14/2013)   Colon polyp    hyperplastic   Costochondritis    Dizziness    Emphysema of lung (HCC)    GERD (gastroesophageal reflux disease)    H. pylori infection    Heart burn    Hemorrhoids    Hypothyroid    Lipidemia    Pneumonia ~ 2009   Subclavian artery stenosis, left (HCC)    subclavian steal physiology status post PTA and stenting   Syncope, near     Past Surgical History:  Procedure Laterality Date   ABDOMINAL HYSTERECTOMY  1999   BILATERAL UPPER EXTREMITY ANGIOGRAM N/A 03/14/2013   Procedure: BILATERAL UPPER EXTREMITY ANGIOGRAM;  Surgeon: Lorretta Harp, MD;  Location: [facility] CATH LAB;  Service: Cardiovascular;  Laterality: N/A;   BREAST BIOPSY Right 1990's   "benign" (03/14/2013)   CARDIAC CATHETERIZATION  03/03/2013   DILATION AND CURETTAGE OF UTERUS  1980's    KNEE ARTHROSCOPY WITH MENISCAL REPAIR Left 07/21/2021   Procedure: Arthroscopic debridement, arthroscopic debridement of bucket-handle component of lateral meniscus tear, and arthroscopically-assisted repair of lateral meniscus root tear, left knee. ;  Surgeon: Corky Mull, MD;  Location: [facility] ORS;  Service: Orthopedics;  Laterality: Left;   LEFT HEART CATHETERIZATION WITH CORONARY ANGIOGRAM Bilateral 03/03/2013   Procedure: LEFT HEART CATHETERIZATION WITH CORONARY ANGIOGRAM;  Surgeon: Troy Sine, MD;  Location: [facility] CATH LAB;  Service: Cardiovascular;  Laterality: Bilateral;   PERCUTANEOUS STENT INTERVENTION  03/14/2013   Procedure: PERCUTANEOUS STENT INTERVENTION;  Surgeon: Lorretta Harp, MD;  Location: [facility] CATH LAB;  Service: Cardiovascular;;   SUBCLAVIAN ARTERY STENT Left 03/14/2013    for subclavian steal/claudication (03/14/2013)   TUBAL LIGATION  1980's    Family History  Problem Relation Age of Onset   Cancer Brother        spine/lung   COPD Brother        smoked   Hypertension Brother    Rheum arthritis Brother    Hyperlipidemia Brother    Hypertension Brother    Diabetes Mother    Heart disease Mother    Stroke Mother    Cancer Father        cirrohosis   Hypertension Father    Heart disease Sister    Diabetes Sister    Hyperlipidemia Sister    Hypertension Sister  COPD Sister    Arthritis Sister        rheumatoid   Diabetes Sister    Arthritis Sister        rheumatoid   Colon cancer Neg Hx     Social History   Socioeconomic History   Marital status: Married    Spouse name: Eddie Dibbles   Number of children: 3   Years of education: Not on file   Highest education level: Some college, no degree  Occupational History   Occupation: Insurance claims handler: [employer]  Tobacco Use   Smoking status: Former    Packs/day: 0.50    Years: 40.00    Pack years: 20.00    Types: Cigarettes    Quit date: 02/11/2013    Years since quitting: 8.5   Smokeless  tobacco: Never  Substance and Sexual Activity   Alcohol use: No   Drug use: No   Sexual activity: Yes    Comment: number of sex partners in the last 12 months  1  Other Topics Concern   Not on file  Social History Narrative   Daily caffeine. Married. Education: [employer]. Exercise: No.      Patient is right-handed. She lives with her husband in a two level home. She drinks 2 cups of coffee a day. 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   Social Determinants of Health   Financial Resource Strain: Not on file  Food Insecurity: Not on file  Transportation Needs: Not on file  Physical Activity: Not on file  Stress: Not on file  Social Connections: Not on file  Intimate Partner Violence: Not on file    Outpatient Medications Prior to Visit  Medication Sig Dispense Refill   albuterol (VENTOLIN HFA) 108 (90 Base) MCG/ACT inhaler INHALE 1-2 PUFFS INTO THE LUNGS EVERY 4 (FOUR) HOURS AS NEEDED FOR WHEEZING OR SHORTNESS OF BREATH. 18 g 2   aspirin EC 81 MG tablet Take 81 mg by mouth daily. Swallow whole.     atorvastatin (LIPITOR) 40 MG tablet Take 1 tablet (40 mg total) by mouth daily. 90 tablet 2   clonazePAM (KLONOPIN) 0.5 MG tablet TAKE 1 TABLET BY MOUTH 2 TIMES DAILY AS NEEDED FOR ANXIETY. 20 tablet 0   clopidogrel (PLAVIX) 75 MG tablet Take 1 tablet (75 mg total) by mouth daily. 90 tablet 2   FLUoxetine (PROZAC) 20 MG tablet Take 1 tablet (20 mg total) by mouth daily. 90 tablet 2   HYDROcodone-acetaminophen (NORCO/VICODIN) 5-325 MG tablet Take 1-2 tablets by mouth every 6 (six) hours as needed for moderate pain. 40 tablet 0   ibuprofen (ADVIL) 200 MG tablet Take 4 tablets (800 mg total) by mouth every 6 (six) hours as needed for mild pain (pain.). 60 tablet 0   SYNTHROID 100 MCG tablet Take 1 tablet (100 mcg total) by mouth daily before breakfast. 90 tablet 2   Facility-Administered Medications Prior to Visit  Medication Dose Route Frequency Provider Last Rate Last Admin    ipratropium (ATROVENT) nebulizer solution 0.5 mg  0.5 mg Nebulization Once Wendie Agreste, MD        No Known Allergies  ROS Review of Systems Per hpi   Objective:    Physical Exam Vitals and nursing note reviewed.  Constitutional:      General: She is not in acute distress.    Appearance: Normal appearance. She is normal weight. She is not ill-appearing, toxic-appearing or diaphoretic.  Cardiovascular:  Rate and Rhythm: Normal rate and regular rhythm.     Heart sounds: Normal heart sounds. No murmur heard.   No friction rub. No gallop.  Pulmonary:     Effort: Pulmonary effort is normal. No respiratory distress.     Breath sounds: No stridor. Wheezing present. No rhonchi or rales.  Chest:     Chest wall: No tenderness.  Skin:    General: Skin is warm and dry.  Neurological:     General: No focal deficit present.     Mental Status: She is alert and oriented to person, place, and time. Mental status is at baseline.  Psychiatric:        Mood and Affect: Mood normal.        Behavior: Behavior normal.        Thought Content: Thought content normal.        Judgment: Judgment normal.    BP 129/79   Pulse 80   Temp 98.1 F (36.7 C) (Temporal)   Resp 17   Ht 5' 5"  (1.651 m)   Wt 171 lb 3.2 oz (77.7 kg)   BMI 28.49 kg/m  Wt Readings from Last 3 Encounters:  08/22/21 171 lb 3.2 oz (77.7 kg)  07/21/21 180 lb (81.6 kg)  06/26/21 191 lb 12.8 oz (87 kg)     Health Maintenance Due  Topic Date Due   PAP SMEAR-Modifier  Never done   Pneumonia Vaccine 81+ Years old (2 - PCV) 10/16/2004   Zoster Vaccines- Shingrix (1 of 2) Never done   MAMMOGRAM  12/20/2017   COVID-19 Vaccine (4 - Booster for Pfizer series) 09/18/2020   INFLUENZA VACCINE  05/16/2021   DEXA SCAN  Never done    There are no preventive care reminders to display for this patient.  Lab Results  Component Value Date   TSH 0.866 12/23/2020   Lab Results  Component Value Date   WBC 10.7 (H)  01/04/2019   HGB 13.2 01/04/2019   HCT 42.1 01/04/2019   MCV 101.4 (H) 01/04/2019   PLT 275 01/04/2019   Lab Results  Component Value Date   NA 141 12/23/2020   K 4.4 12/23/2020   CO2 27 12/23/2020   GLUCOSE 62 (L) 12/23/2020   BUN 10 12/23/2020   CREATININE 0.77 12/23/2020   BILITOT 0.2 12/23/2020   ALKPHOS 92 12/23/2020   AST 22 12/23/2020   ALT 34 (H) 12/23/2020   PROT 7.7 12/23/2020   ALBUMIN 4.9 (H) 12/23/2020   CALCIUM 10.0 12/23/2020   ANIONGAP 8 01/04/2019   EGFR 86 12/23/2020   GFR 82.73 10/01/2015   Lab Results  Component Value Date   CHOL 166 12/23/2020   Lab Results  Component Value Date   HDL 69 12/23/2020   Lab Results  Component Value Date   LDLCALC 73 12/23/2020   Lab Results  Component Value Date   TRIG 138 12/23/2020   Lab Results  Component Value Date   CHOLHDL 2.4 12/23/2020   Lab Results  Component Value Date   HGBA1C 5.4 06/24/2013      Assessment & Plan:   Problem List Items Addressed This Visit   None Visit Diagnoses     COPD exacerbation ([place])    -  Primary   Relevant Medications   azithromycin (ZITHROMAX) 250 MG tablet   predniSONE (STERAPRED UNI-PAK 21 TAB) 10 MG (21) TBPK tablet   HYDROcodone bit-homatropine (HYCODAN) 5-1.5 MG/5ML syrup   dextromethorphan-guaiFENesin (MUCINEX DM) 30-600 MG 12hr tablet  Meds ordered this encounter  Medications   azithromycin (ZITHROMAX) 250 MG tablet    Sig: Take 2 tablets on day 1, then 1 tablet daily on days 2 through 5    Dispense:  6 tablet    Refill:  0    Order Specific Question:   Supervising Provider    Answer:   Carlota Raspberry, JEFFREY R [[id number]]   predniSONE (STERAPRED UNI-PAK 21 TAB) 10 MG (21) TBPK tablet    Sig: Take per package instructions. Do not skip doses. Finish entire supply.    Dispense:  1 each    Refill:  0    Order Specific Question:   Supervising Provider    Answer:   Carlota Raspberry, JEFFREY R [[id number]]   HYDROcodone bit-homatropine (HYCODAN) 5-1.5 MG/5ML syrup     Sig: Take 5 mLs by mouth every 8 (eight) hours as needed for cough.    Dispense:  120 mL    Refill:  0    Order Specific Question:   Supervising Provider    Answer:   Carlota Raspberry, JEFFREY R [[id number]]   dextromethorphan-guaiFENesin (MUCINEX DM) 30-600 MG 12hr tablet    Sig: Take 1 tablet by mouth 2 (two) times daily.    Dispense:  20 tablet    Refill:  0    Order Specific Question:   Supervising Provider    Answer:   Carlota Raspberry, JEFFREY R [[id number]]    Follow-up: Return if symptoms worsen or fail to improve.   PLAN Z pack and tessalon Mucinex and hycodan for symptom relief Discussed risks associated with hycodan. She will not take it with any other sedatives including her analgesic and clonazepam. Return and ER precautions reviewed. Patient encouraged to call clinic with any questions, comments, or concerns.   Maximiano Coss, NP

## 2021-08-31 ENCOUNTER — Other Ambulatory Visit: Payer: Self-pay

## 2021-08-31 DIAGNOSIS — J441 Chronic obstructive pulmonary disease with (acute) exacerbation: Secondary | ICD-10-CM

## 2021-08-31 MED ORDER — HYDROCODONE BIT-HOMATROP MBR 5-1.5 MG/5ML PO SOLN: 5.0000 mL | Freq: Three times a day (TID) | ORAL | 0 refills | Status: DC | PRN

## 2021-08-31 NOTE — Telephone Encounter (Signed)
Patient is requesting a refill of the following medications: Requested Prescriptions   Pending Prescriptions Disp Refills   HYDROcodone bit-homatropine (HYCODAN) 5-1.5 MG/5ML syrup 120 mL 0    Sig: Take 5 mLs by mouth every 8 (eight) hours as needed for cough.    Date of patient request: 08/31/21 Last office visit: 08/22/21 Date of last refill: 08/22/21 Last refill amount: Follow up time period per chart: PRN

## 2021-08-31 NOTE — Telephone Encounter (Signed)
Caller name:[name] Probation officer callback #:[phone]  Encourage patient to contact the pharmacy for refills or they can request refills through [facility]  (Please schedule appointment if patient has not been seen in over a year)  MEDICATION NAME & DOSE:HYDROcodone bit-homatropine (HYCODAN) 5-1.5 MG/5ML syrup [[id number]]   Notes/Comments from patient:Pt needs cough meds continued still coughing and congested   WHAT PHARMACY WOULD THEY LIKE THIS SENT TO: [id number] - [place], [place] - [address]   Please notify patient: It takes 48-72 hours to process rx refill requests Ask patient to call pharmacy to ensure rx is ready before heading there.   (CLINICAL TO FILL OR ROUTE PER PROTOCOLS)

## 2021-09-13 ENCOUNTER — Other Ambulatory Visit: Payer: Self-pay | Admitting: Family Medicine

## 2021-09-13 DIAGNOSIS — E785 Hyperlipidemia, unspecified: Secondary | ICD-10-CM

## 2021-09-13 DIAGNOSIS — E039 Hypothyroidism, unspecified: Secondary | ICD-10-CM

## 2021-09-14 ENCOUNTER — Emergency Department
Admission: EM | Admit: 2021-09-14 | Discharge: 2021-09-14 | Disposition: A | Discharge status: Home / Self Care | Payer: Medicare Other | Attending: Emergency Medicine | Admitting: Emergency Medicine

## 2021-09-14 ENCOUNTER — Other Ambulatory Visit: Payer: Self-pay

## 2021-09-14 ENCOUNTER — Emergency Department: Payer: Medicare Other

## 2021-09-14 DIAGNOSIS — S299XXA Unspecified injury of thorax, initial encounter: Secondary | ICD-10-CM | POA: Diagnosis present

## 2021-09-14 DIAGNOSIS — Z79899 Other long term (current) drug therapy: Secondary | ICD-10-CM | POA: Insufficient documentation

## 2021-09-14 DIAGNOSIS — Z7982 Long term (current) use of aspirin: Secondary | ICD-10-CM | POA: Insufficient documentation

## 2021-09-14 DIAGNOSIS — J449 Chronic obstructive pulmonary disease, unspecified: Secondary | ICD-10-CM | POA: Insufficient documentation

## 2021-09-14 DIAGNOSIS — E039 Hypothyroidism, unspecified: Secondary | ICD-10-CM | POA: Insufficient documentation

## 2021-09-14 DIAGNOSIS — Z7902 Long term (current) use of antithrombotics/antiplatelets: Secondary | ICD-10-CM | POA: Insufficient documentation

## 2021-09-14 DIAGNOSIS — Y92410 Unspecified street and highway as the place of occurrence of the external cause: Secondary | ICD-10-CM | POA: Insufficient documentation

## 2021-09-14 DIAGNOSIS — Z87891 Personal history of nicotine dependence: Secondary | ICD-10-CM | POA: Insufficient documentation

## 2021-09-14 DIAGNOSIS — S20211A Contusion of right front wall of thorax, initial encounter: Secondary | ICD-10-CM | POA: Insufficient documentation

## 2021-09-14 DIAGNOSIS — S301XXA Contusion of abdominal wall, initial encounter: Secondary | ICD-10-CM | POA: Insufficient documentation

## 2021-09-14 MED ORDER — METHOCARBAMOL 500 MG PO TABS: 500.0000 mg | ORAL_TABLET | Freq: Three times a day (TID) | ORAL | 0 refills | Status: AC | PRN
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
## 2021-09-14 NOTE — Discharge Instructions (Addendum)
You can take Robaxin up to three times daily for the next five days.  Apply ice to breast with gentle massage to help break up hematoma.

## 2021-09-15 ENCOUNTER — Telehealth: Payer: Self-pay
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
## 2021-09-20 ENCOUNTER — Other Ambulatory Visit: Payer: Self-pay | Admitting: Family Medicine

## 2021-09-20 DIAGNOSIS — F418 Other specified anxiety disorders: Secondary | ICD-10-CM

## 2021-09-20 DIAGNOSIS — G458 Other transient cerebral ischemic attacks and related syndromes: Secondary | ICD-10-CM

## 2021-09-23 ENCOUNTER — Ambulatory Visit (INDEPENDENT_AMBULATORY_CARE_PROVIDER_SITE_OTHER): Payer: Medicare Other | Admitting: Registered Nurse

## 2021-09-23 ENCOUNTER — Encounter: Payer: Self-pay | Admitting: Registered Nurse

## 2021-09-23 VITALS — BP 132/82 | HR 84 | Temp 97.9°F | Resp 17 | Wt 171.8 lb

## 2021-09-23 DIAGNOSIS — J441 Chronic obstructive pulmonary disease with (acute) exacerbation: Secondary | ICD-10-CM

## 2021-09-23 DIAGNOSIS — J22 Unspecified acute lower respiratory infection: Secondary | ICD-10-CM | POA: Diagnosis not present

## 2021-09-23 MED ORDER — PREDNISONE 10 MG PO TABS: ORAL_TABLET | ORAL | 0 refills | Status: AC

## 2021-09-23 MED ORDER — HYDROCODONE BIT-HOMATROP MBR 5-1.5 MG/5ML PO SOLN: 5.0000 mL | Freq: Three times a day (TID) | ORAL | 0 refills | Status: DC | PRN

## 2021-09-23 MED ORDER — DOXYCYCLINE HYCLATE 100 MG PO TABS
100.0000 mg | ORAL_TABLET | Freq: Two times a day (BID) | ORAL | 0 refills | Status: DC
Start: 2021-09-23 — End: 2022-01-19
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
## 2021-09-23 NOTE — Patient Instructions (Signed)
Ms. Seckman -   Randie Heinz to see you! Sorry it's again so soon  Take doxycycline and prednisone - finish entire course  Use hycodan as needed  Deep breathing exercises when possible  Ok to use mucinex OTC. Continue albuterol prn  Let me know if you haven't rounded a corner by next Thursday  Thank you  Rich

## 2021-09-28 ENCOUNTER — Other Ambulatory Visit: Payer: Self-pay | Admitting: Family Medicine

## 2021-09-28 DIAGNOSIS — F418 Other specified anxiety disorders: Secondary | ICD-10-CM

## 2021-09-29 NOTE — Telephone Encounter (Signed)
Patient is requesting a refill of the following medications: Requested Prescriptions   Pending Prescriptions Disp Refills   clonazePAM (KLONOPIN) 0.5 MG tablet [Pharmacy Med Name: CLONAZEPAM 0.5 MG TABLET] 20 tablet 0    Sig: TAKE 1 TABLET BY MOUTH TWICE A DAY AS NEEDED FOR ANXIETY    Date of patient request: 09/28/2021 Last office visit: 09/23/2021 Date of last refill: 06/28/2021 Last refill amount: 20 tablets  Follow up time period per chart: N/a

## 2021-09-29 NOTE — Telephone Encounter (Signed)
Discussed at office visit in March.  Controlled substance database reviewed.  Last filled September 13 for number 20.  Refill ordered.

## 2021-11-11 ENCOUNTER — Other Ambulatory Visit (HOSPITAL_COMMUNITY): Payer: Self-pay | Admitting: Cardiovascular Disease

## 2021-11-11 DIAGNOSIS — I6523 Occlusion and stenosis of bilateral carotid arteries: Secondary | ICD-10-CM

## 2021-11-14 ENCOUNTER — Ambulatory Visit (HOSPITAL_COMMUNITY)
Admission: RE | Admit: 2021-11-14 | Discharge: 2021-11-14 | Disposition: A | Discharge status: Home / Self Care | Payer: Medicare Other | Source: Ambulatory Visit | Attending: Cardiology | Admitting: Cardiology

## 2021-11-14 ENCOUNTER — Other Ambulatory Visit (HOSPITAL_COMMUNITY): Payer: Self-pay | Admitting: Cardiovascular Disease

## 2021-11-14 ENCOUNTER — Other Ambulatory Visit: Payer: Self-pay

## 2021-11-14 DIAGNOSIS — I6523 Occlusion and stenosis of bilateral carotid arteries: Secondary | ICD-10-CM

## 2021-11-21 ENCOUNTER — Other Ambulatory Visit: Payer: Self-pay | Admitting: Family Medicine

## 2021-11-21 DIAGNOSIS — F418 Other specified anxiety disorders: Secondary | ICD-10-CM

## 2022-01-12 ENCOUNTER — Encounter: Payer: Self-pay | Admitting: Family Medicine

## 2022-01-12 ENCOUNTER — Ambulatory Visit (INDEPENDENT_AMBULATORY_CARE_PROVIDER_SITE_OTHER): Payer: Medicare Other | Admitting: Family Medicine

## 2022-01-12 VITALS — BP 132/80 | HR 71 | Temp 98.0°F | Resp 16 | Ht 65.0 in | Wt 171.0 lb

## 2022-01-12 DIAGNOSIS — R030 Elevated blood-pressure reading, without diagnosis of hypertension: Secondary | ICD-10-CM

## 2022-01-12 DIAGNOSIS — E039 Hypothyroidism, unspecified: Secondary | ICD-10-CM

## 2022-01-12 DIAGNOSIS — E785 Hyperlipidemia, unspecified: Secondary | ICD-10-CM | POA: Diagnosis not present

## 2022-01-12 DIAGNOSIS — G458 Other transient cerebral ischemic attacks and related syndromes: Secondary | ICD-10-CM | POA: Diagnosis not present

## 2022-01-12 DIAGNOSIS — I6523 Occlusion and stenosis of bilateral carotid arteries: Secondary | ICD-10-CM | POA: Diagnosis not present

## 2022-01-12 LAB — LIPID PANEL
Cholesterol: 148 mg/dL (ref 0–200)
HDL: 63.7 mg/dL (ref >39.00)
LDL Cholesterol: 57 mg/dL (ref 0–99)
NonHDL: 84.44
Total CHOL/HDL Ratio: 2
Triglycerides: 135 mg/dL (ref 0.0–149.0)
VLDL: 27 mg/dL (ref 0.0–40.0)

## 2022-01-12 LAB — COMPREHENSIVE METABOLIC PANEL
ALT: 22 U/L (ref 0–35)
AST: 18 U/L (ref 0–37)
Albumin: 5 g/dL (ref 3.5–5.2)
Alkaline Phosphatase: 101 U/L (ref 39–117)
BUN: 9 mg/dL (ref 6–23)
CO2: 28 mEq/L (ref 19–32)
Calcium: 10.2 mg/dL (ref 8.4–10.5)
Chloride: 99 mEq/L (ref 96–112)
Creatinine, Ser: 0.66 mg/dL (ref 0.40–1.20)
GFR: 92.01 mL/min (ref >60.00)
Glucose, Bld: 78 mg/dL (ref 70–99)
Potassium: 4.1 mEq/L (ref 3.5–5.1)
Sodium: 138 mEq/L (ref 135–145)
Total Bilirubin: 0.3 mg/dL (ref 0.2–1.2)
Total Protein: 7.9 g/dL (ref 6.0–8.3)

## 2022-01-12 LAB — TSH: TSH: 0.56 u[IU]/mL (ref 0.35–5.50)

## 2022-01-12 NOTE — Patient Instructions (Addendum)
Keep a record of your blood pressures outside of the office and bring monitor to make sure it is reading accurately.  ? ?How to Take Your Blood Pressure ?Blood pressure is a measurement of how strongly your blood is pressing against the walls of your arteries. Arteries are blood vessels that carry blood from your heart throughout your body. Your health care provider takes your blood pressure at each office visit. You can also take your own blood pressure at home with a blood pressure monitor. ?You may need to take your own blood pressure to: ?Confirm a diagnosis of high blood pressure (hypertension). ?Monitor your blood pressure over time. ?Make sure your blood pressure medicine is working. ?Supplies needed: ?Blood pressure monitor. ?Dining room chair to sit in. ?Table or desk. ?Small notebook and pencil or pen. ?How to prepare ?To get the most accurate reading, avoid the following for 30 minutes before you check your blood pressure: ?Drinking caffeine. ?Drinking alcohol. ?Eating. ?Smoking. ?Exercising. ?Five minutes before you check your blood pressure: ?Use the bathroom and urinate so that you have an empty bladder. ?Sit quietly in a dining room chair. Do not sit in a soft couch or an armchair. Do not talk. ?How to take your blood pressure ?To check your blood pressure, follow the instructions in the manual that came with your blood pressure monitor. If you have a digital blood pressure monitor, the instructions may be as follows: ?Sit up straight in a chair. ?Place your feet on the floor. Do not cross your ankles or legs. ?Rest your left arm at the level of your heart on a table or desk or on the arm of a chair. ?Pull up your shirt sleeve. ?Wrap the blood pressure cuff around the upper part of your left arm, 1 inch (2.5 cm) above your elbow. It is best to wrap the cuff around bare skin. ?Fit the cuff snugly around your arm. You should be able to place only one finger between the cuff and your arm. ?Position the  cord so that it rests in the bend of your elbow. ?Press the power button. ?Sit quietly while the cuff inflates and deflates. ?Read the digital reading on the monitor screen and write the numbers down (record them) in a notebook. ?Wait 2-3 minutes, then repeat the steps, starting at step 1. ?What does my blood pressure reading mean? ?A blood pressure reading consists of a higher number over a lower number. Ideally, your blood pressure should be below 120/80. The first ("top") number is called the systolic pressure. It is a measure of the pressure in your arteries as your heart beats. The second ("bottom") number is called the diastolic pressure. It is a measure of the pressure in your arteries as the heart relaxes. ?Blood pressure is classified into five stages. The following are the stages for adults who do not have a short-term serious illness or a chronic condition. Systolic pressure and diastolic pressure are measured in a unit called mm Hg (millimeters of mercury).  ?Normal ?Systolic pressure: below 123456. ?Diastolic pressure: below 80. ?Elevated ?Systolic pressure: [id number]. ?Diastolic pressure: below 80. ?Hypertension stage 1 ?Systolic pressure: 0000000. ?Diastolic pressure: [id number]. ?Hypertension stage 2 ?Systolic pressure: [id number] or above. ?Diastolic pressure: 90 or above. ?You can have elevated blood pressure or hypertension even if only the systolic or only the diastolic number in your reading is higher than normal. ?Follow these instructions at home: ?Medicines ?Take over-the-counter and prescription medicines only as told by your health care provider. ?Tell your  health care provider if you are having any side effects from blood pressure medicine. ?General instructions ?Check your blood pressure as often as recommended by your health care provider. ?Check your blood pressure at the same time every day. ?Take your monitor to the next appointment with your health care provider to make sure that: ?You are using it  correctly. ?It provides accurate readings. ?Understand what your goal blood pressure numbers are. ?Keep all follow-up visits as told by your health care provider. This is important. ?General tips ?Your health care provider can suggest a reliable monitor that will meet your needs. There are several types of home blood pressure monitors. ?Choose a monitor that has an arm cuff. Do not choose a monitor that measures your blood pressure from your wrist or finger. ?Choose a cuff that wraps snugly around your upper arm. You should be able to fit only one finger between your arm and the cuff. ?You can buy a blood pressure monitor at most drugstores or online. ?Where to find more information ?American Heart Association: www.heart.org ?Contact a health care provider if: ?Your blood pressure is consistently high. ?Your blood pressure is suddenly low. ?Get help right away if: ?Your systolic blood pressure is higher than 180. ?Your diastolic blood pressure is higher than 120. ?Summary ?Blood pressure is a measurement of how strongly your blood is pressing against the walls of your arteries. ?A blood pressure reading consists of a higher number over a lower number. Ideally, your blood pressure should be below 120/80. ?Check your blood pressure at the same time every day. ?Avoid caffeine, alcohol, smoking, and exercise for 30 minutes prior to checking your blood pressure. These agents can affect the accuracy of the blood pressure reading. ?This information is not intended to replace advice given to you by your health care provider. Make sure you discuss any questions you have with your health care provider. ?Document Revised: 08/11/2020 Document Reviewed: 09/26/2019 ?Elsevier Patient Education ? 2022 [place]. ? ? ? ? ?

## 2022-01-12 NOTE — Progress Notes (Signed)
? ?Subjective:  ?Patient ID: Bethany Clarke, female    DOB: Jan 30, 1956  Age: 66 y.o. MRN: [id number] ? ?CC:  ?Chief Complaint  ?Patient presents with  ? Hypertension  ?  Pt here for BP check, bought BP machine for husband and notes her has been low in the past but for the last week has been higher, denies physical sxs   ? ? ?HPI ?Bethany Clarke presents for  ? ?Blood pressure concern: ?Presented for blood pressure check.  She is overdue for routine follow-up, last visit with me in March 2022.  23-month follow-up recommended at that time. ?History of subclavian steal syndrome, treated with Plavix, followed by cardiology Dr. Tresa Endo with OV last year recent carotid US reported no change few months ago.  ?No current antihypertensives.  She does take Synthroid 100 mcg daily for hypothyroidism.  Lipitor for hyperlipidemia. ?Fasting today.  ? ?Home readings: ?192/97 last night, not stressed at the time. Feeling fine - no CP, weakness, no slurred speech, urinating normally.  ?189/107 last week.  ?Tried different arms - high in both arms.  Home readings reviewed per handout.  ?Left arm is 181/100, 168/102, right arm 166/95, 164/102.  Lowest reading on home testing 146/80.  Heart rates usually in the 70s. ?No tobacco in 7 years.  ? ?BP Readings from Last 3 Encounters:  ?01/12/22 132/80  ?09/23/21 132/82  ?09/14/21 134/83  ? ?Lab Results  ?Component Value Date  ? CREATININE 0.77 12/23/2020  ? ? ? ?History ?Patient Active Problem List  ? Diagnosis Date Noted  ? Chest pain, atypical 03/17/2016  ? Paresthesia 07/14/2013  ? Bilateral foot pain 05/01/2013  ? Subclavian artery stenosis, left (HCC)   ? Subclavian steal syndrome 03/16/2013  ? Subclavian arterial stenosis (HCC) 03/11/2013  ? Carotid stenosis 03/11/2013  ? Hyperlipidemia with target LDL less than 70 03/11/2013  ? GERD (gastroesophageal reflux disease)   ? Hypothyroid   ? Dizziness   ? Syncope, near   ? Heart burn   ? Hypothyroidism 05/28/2007  ? COPD GOLD II with  reversibility  05/28/2007  ? ?Past Medical History:  ?Diagnosis Date  ? Atherosclerosis 11/14/2005  ? carotid US - mild calcific non-stenotic plaque bilaterally  ? Carotid bruit 02/05/2009  ? Doppler - R/L ICAs 0-49% diameter reductin (velocities suggest low-mid range); L subclavian 0-49%  diameter reduction  ? Chronic bronchitis (HCC)   ? "growing up; not anymore" (03/14/2013)  ? Colon polyp   ? hyperplastic  ? Costochondritis   ? Dizziness   ? Emphysema of lung (HCC)   ? GERD (gastroesophageal reflux disease)   ? H. pylori infection   ? Heart burn   ? Hemorrhoids   ? Hypothyroid   ? Lipidemia   ? Pneumonia ~ 2009  ? Subclavian artery stenosis, left (HCC)   ? subclavian steal physiology status post PTA and stenting  ? Syncope, near   ? ?Past Surgical History:  ?Procedure Laterality Date  ? ABDOMINAL HYSTERECTOMY  1999  ? BILATERAL UPPER EXTREMITY ANGIOGRAM N/A 03/14/2013  ? Procedure: BILATERAL UPPER EXTREMITY ANGIOGRAM;  Surgeon: [name], MD;  Location: [facility] CATH LAB;  Service: Cardiovascular;  Laterality: N/A;  ? BREAST BIOPSY Right 1990's  ? "benign" (03/14/2013)  ? CARDIAC CATHETERIZATION  03/03/2013  ? DILATION AND CURETTAGE OF UTERUS  1980's  ? KNEE ARTHROSCOPY WITH MENISCAL REPAIR Left 07/21/2021  ? Procedure: Arthroscopic debridement, arthroscopic debridement of bucket-handle component of lateral meniscus tear, and arthroscopically-assisted repair of lateral meniscus root  tear, left knee. ;  Surgeon: Christena Flake, MD;  Location: [facility] ORS;  Service: Orthopedics;  Laterality: Left;  ? LEFT HEART CATHETERIZATION WITH CORONARY ANGIOGRAM Bilateral 03/03/2013  ? Procedure: LEFT HEART CATHETERIZATION WITH CORONARY ANGIOGRAM;  Surgeon: Lennette Bihari, MD;  Location: [facility] CATH LAB;  Service: Cardiovascular;  Laterality: Bilateral;  ? PERCUTANEOUS STENT INTERVENTION  03/14/2013  ? Procedure: PERCUTANEOUS STENT INTERVENTION;  Surgeon: [name], MD;  Location: [facility] CATH LAB;  Service: Cardiovascular;;  ? SUBCLAVIAN  ARTERY STENT Left 03/14/2013  ?  for subclavian steal/claudication (03/14/2013)  ? TUBAL LIGATION  1980's  ? ?No Known Allergies ?Prior to Admission medications   ?Medication Sig Start Date End Date Taking? Authorizing Provider  ?albuterol (VENTOLIN HFA) 108 (90 Base) MCG/ACT inhaler INHALE 1-2 PUFFS INTO THE LUNGS EVERY 4 (FOUR) HOURS AS NEEDED FOR WHEEZING OR SHORTNESS OF BREATH. 12/22/20  Yes [name], MD  ?atorvastatin (LIPITOR) 40 MG tablet TAKE 1 TABLET BY MOUTH EVERY DAY 09/13/21  Yes Sheliah Hatch, MD  ?clopidogrel (PLAVIX) 75 MG tablet TAKE 1 TABLET BY MOUTH EVERY DAY 09/20/21  Yes [name], MD  ?famotidine (PEPCID) 20 MG tablet Take by mouth. 08/24/21 08/24/22 Yes [provider]  ?FLUoxetine (PROZAC) 20 MG tablet TAKE 1 TABLET BY MOUTH EVERY DAY 11/23/21  Yes [name], MD  ?ibuprofen (ADVIL) 200 MG tablet Take 4 tablets (800 mg total) by mouth every 6 (six) hours as needed for mild pain (pain.). 07/21/21  Yes Poggi, Excell Seltzer, MD  ?SYNTHROID 100 MCG tablet TAKE 1 TABLET BY MOUTH DAILY BEFORE BREAKFAST. 09/13/21  Yes Sheliah Hatch, MD  ?aspirin EC 81 MG tablet Take 81 mg by mouth daily. Swallow whole. ?Patient not taking: Reported on 09/23/2021    [provider]  ?clonazePAM (KLONOPIN) 0.5 MG tablet TAKE 1 TABLET BY MOUTH TWICE A DAY AS NEEDED FOR ANXIETY 09/29/21   [name], MD  ?dextromethorphan-guaiFENesin [facility] DM) 30-600 MG 12hr tablet Take 1 tablet by mouth 2 (two) times daily. ?Patient not taking: Reported on 09/23/2021 08/22/21   Janeece Agee, NP  ?doxycycline (VIBRA-TABS) 100 MG tablet Take 1 tablet (100 mg total) by mouth 2 (two) times daily. 09/23/21   Janeece Agee, NP  ?HYDROcodone bit-homatropine (HYCODAN) 5-1.5 MG/5ML syrup Take 5 mLs by mouth every 8 (eight) hours as needed for cough. 09/23/21   Janeece Agee, NP  ?HYDROcodone-acetaminophen (NORCO/VICODIN) 5-325 MG tablet Take 1-2 tablets by mouth every 6 (six) hours as needed for  moderate pain. 07/21/21 07/21/22  Poggi, Excell Seltzer, MD  ? ?Social History  ? ?Socioeconomic History  ? Marital status: Married  ?  Spouse name: Renae Fickle  ? Number of children: 3  ? Years of education: Not on file  ? Highest education level: Some college, no degree  ?Occupational History  ? Occupation: Interior and spatial designer  ?  Employer: [employer]  ?Tobacco Use  ? Smoking status: Former  ?  Packs/day: 0.50  ?  Years: 40.00  ?  Pack years: 20.00  ?  Types: Cigarettes  ?  Quit date: 02/11/2013  ?  Years since quitting: 8.9  ? Smokeless tobacco: Never  ?Substance and Sexual Activity  ? Alcohol use: No  ? Drug use: No  ? Sexual activity: Yes  ?  Comment: number of sex partners in the last 12 months  1  ?Other Topics Concern  ? Not on file  ?Social History Narrative  ? Daily caffeine. Married. Education: High  School. Exercise: No.  ?   ? Patient is right-handed. She lives with her husband in a two level home. She drinks 2 cups of coffee a day. 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  ? ?Social Determinants of Health  ? ?Financial Resource Strain: Not on file  ?Food Insecurity: Not on file  ?Transportation Needs: Not on file  ?Physical Activity: Not on file  ?Stress: Not on file  ?Social Connections: Not on file  ?Intimate Partner Violence: Not on file  ? ? ?Review of Systems  ?Constitutional:  Negative for fatigue and unexpected weight change.  ?Respiratory:  Negative for chest tightness and shortness of breath.   ?Cardiovascular:  Negative for chest pain, palpitations and leg swelling.  ?Gastrointestinal:  Negative for abdominal pain and blood in stool.  ?Neurological:  Negative for dizziness, syncope, light-headedness and headaches.  ? ? ?Objective:  ? ?Vitals:  ? 01/12/22 1337 01/12/22 1340  ?BP: 134/80 132/80  ?Pulse: 71   ?Resp: 16   ?Temp: 98 ?F (36.7 ?C)   ?TempSrc: Temporal   ?SpO2: 93%   ?Weight: 171 lb (77.6 kg)   ?Height: 5\' 5"  (1.651 m)   ? ? ? ?Physical Exam ?Vitals reviewed.  ?Constitutional:   ?    Appearance: Normal appearance. She is well-developed.  ?HENT:  ?   Head: Normocephalic and atraumatic.  ?Eyes:  ?   Conjunctiva/sclera: Conjunctivae normal.  ?   Pupils: Pupils are equal, round, and reactive t

## 2022-01-18 ENCOUNTER — Other Ambulatory Visit: Payer: Self-pay | Admitting: Family Medicine

## 2022-01-18 DIAGNOSIS — Z1231 Encounter for screening mammogram for malignant neoplasm of breast: Secondary | ICD-10-CM

## 2022-01-19 ENCOUNTER — Ambulatory Visit (INDEPENDENT_AMBULATORY_CARE_PROVIDER_SITE_OTHER): Payer: Medicare Other | Admitting: Family Medicine

## 2022-01-19 ENCOUNTER — Encounter: Payer: Self-pay | Admitting: Family Medicine

## 2022-01-19 VITALS — BP 132/78 | HR 78 | Temp 98.8°F | Resp 16 | Ht 65.0 in | Wt 181.2 lb

## 2022-01-19 DIAGNOSIS — I6523 Occlusion and stenosis of bilateral carotid arteries: Secondary | ICD-10-CM

## 2022-01-19 DIAGNOSIS — J441 Chronic obstructive pulmonary disease with (acute) exacerbation: Secondary | ICD-10-CM

## 2022-01-19 DIAGNOSIS — J0100 Acute maxillary sinusitis, unspecified: Secondary | ICD-10-CM

## 2022-01-19 DIAGNOSIS — J22 Unspecified acute lower respiratory infection: Secondary | ICD-10-CM | POA: Diagnosis not present

## 2022-01-19 DIAGNOSIS — R059 Cough, unspecified: Secondary | ICD-10-CM | POA: Diagnosis not present

## 2022-01-19 MED ORDER — HYDROCODONE BIT-HOMATROP MBR 5-1.5 MG/5ML PO SOLN: 5.0000 mL | Freq: Three times a day (TID) | ORAL | 0 refills | Status: DC | PRN

## 2022-01-19 MED ORDER — DOXYCYCLINE HYCLATE 100 MG PO TABS: 100.0000 mg | ORAL_TABLET | Freq: Two times a day (BID) | ORAL | 0 refills | Status: DC

## 2022-01-19 MED ORDER — PREDNISONE 20 MG PO TABS: ORAL_TABLET | ORAL | 0 refills | Status: DC

## 2022-01-19 NOTE — Patient Instructions (Signed)
Start doxycycline for sinuses and likely COPD flare.  Prednisone for COPD flare.  Albuterol up to every 4-6 hours if needed but if you require that medication sooner be seen through ER or urgent care.  Cough syrup given at nighttime as long as you are not wheezing or short of breath.  Use albuterol for those symptoms.  Recheck if not improving in the next 2 to 3 days, sooner if worse.  ? ?Chronic Obstructive Pulmonary Disease Exacerbation ?Chronic obstructive pulmonary disease (COPD) is a long-term (chronic) condition that affects the lungs. COPD is a general term that can be used to describe many different lung problems that cause lung inflammation and limit airflow, including chronic bronchitis and emphysema. COPD exacerbations are episodes when breathing symptoms flare up, become much worse, and require extra treatment. ?COPD exacerbations are usually caused by infections. Without treatment, COPD exacerbations can be severe and even life threatening. Frequent COPD exacerbations can cause further damage to the lungs. ?What are the causes? ?This condition may be caused by: ?Respiratory infections, including viral and bacterial infections. ?Exposure to smoke. ?Exposure to air pollution, chemical fumes, or dust. ?Things that can cause an allergic reaction (allergens). ?Not taking your usual COPD medicines as directed. ?Underlying medical problems, such as congestive heart failure or infections not involving the lungs. ?In many cases, the cause of this condition is not known. ?What increases the risk? ?The following factors may make you more likely to develop this condition: ?Smoking cigarettes. ?Being an older adult. ?Having frequent prior COPD exacerbations. ?What are the signs or symptoms? ?Symptoms of this condition include: ?Increased coughing. ?Increased production of mucus from your lungs. ?Increased wheezing and shortness of breath. ?Rapid or labored breathing. ?Chest tightness. ?Less energy than usual. ?Sleep  disruption from symptoms. ?Confusion ?Increased sleepiness. ?Often, these symptoms happen or get worse even with the use of medicines. ?How is this diagnosed? ?This condition is diagnosed based on: ?Your medical history. ?A physical exam. ?You may also have tests, including: ?A chest X-ray. ?Blood tests. ?Lung (pulmonary) function tests. ?How is this treated? ?Treatment for this condition depends on the severity and cause of the symptoms. You may need to be admitted to a hospital for treatment. Some of the treatments commonly used to treat COPD exacerbations are: ?Antibiotic medicines. These may be used for severe exacerbations caused by a lung infection, such as pneumonia. ?Bronchodilators. These are inhaled medicines that expand the air passages and allow increased airflow. They may make your breathing more comfortable. ?Steroid medicines. These act to reduce inflammation in the airways. They may be given with an inhaler, taken by mouth, or given through an IV tube inserted into one of your veins. ?Supplemental oxygen therapy. ?Airway clearing techniques, such as noninvasive ventilation (NIV) and positive expiratory pressure (PEP). These provide respiratory support through a mask or other noninvasive device. An example of this would be using a continuous positive airway pressure (CPAP) machine to improve delivery of oxygen into your lungs. ?Follow these instructions at home: ?Medicines ?Take over-the-counter and prescription medicines only as told by your health care provider. ?It is important to use correct technique with inhaled medicines. ?If you were prescribed an antibiotic medicine or oral steroid, take it as told by your health care provider. Do not stop taking the medicine even if you start to feel better. ?Lifestyle ?Do not use any products that contain nicotine or tobacco. These products include cigarettes, chewing tobacco, and vaping devices, such as e-cigarettes. If you need help quitting, ask your  health care provider. ?Eat a healthy diet. ?Exercise regularly. ?Get enough sleep. Most adults need 7 or more hours per night. ?Avoid exposure to all substances that irritate the airway, especially tobacco smoke. ?Regularly wash your hands with soap and water for at least 20 seconds. If soap and water are not available, use hand sanitizer. This may help prevent you from getting infections. ?During flu season, avoid enclosed spaces that are crowded with people. ?General instructions ?Drink enough fluid to keep your urine pale yellow, unless you have a medical condition that requires fluid restriction. ?Use a cool mist vaporizer. This humidifies the air and makes it easier for you to clear your chest when you cough. ?If you have a home nebulizer and oxygen, continue to use them as told by your health care provider. ?Keep all follow-up visits. This is important. ?How is this prevented? ?Stay up-to-date on pneumococcal and flu (influenza) vaccines. A flu shot is recommended every year to help prevent exacerbations. ?Quitting smoking is very important in preventing COPD from getting worse and in preventing exacerbations from happening as often. ?Follow all instructions for pulmonary rehabilitation after a recent exacerbation. This can help prevent future exacerbations. ?Work with your health care provider to develop and follow an action plan. This tells you what steps to take when you experience certain symptoms. ?Contact a health care provider if: ?You have a worsening of your regular COPD symptoms. ?Get help right away if: ?You have worsening shortness of breath, even when resting. ?You have trouble talking. ?You have severe chest pain. ?You cough up blood. ?You have a fever. ?You have weakness, vomit repeatedly, or faint. ?You feel confused. ?You are not able to sleep because of your symptoms. ?You have trouble doing daily activities. ?These symptoms may represent a serious problem that is an emergency. Do not wait to  see if the symptoms will go away. Get medical help right away. Call your local emergency services (911 in the [place] Do not drive yourself to the hospital. ?Summary ?COPD exacerbations are episodes when breathing symptoms become much worse and require extra treatment above your normal treatment. ?Exacerbations can be severe and even life threatening. Frequent COPD exacerbations can cause further damage to your lungs. ?COPD exacerbations are usually triggered by infections such as the flu, colds, and even pneumonia. ?Treatment for this condition depends on the severity and cause of the symptoms. You may need to be admitted to a hospital for treatment. ?Quitting smoking is very important to prevent COPD from getting worse and to prevent exacerbations from happening as often. ?This information is not intended to replace advice given to you by your health care provider. Make sure you discuss any questions you have with your health care provider. ?Document Revised: 08/10/2020 Document Reviewed: 08/10/2020 ?Elsevier Patient Education ? 2022 [employer] ? ?

## 2022-01-19 NOTE — Progress Notes (Signed)
? ?Subjective:  ?Patient ID: Bethany Clarke, female    DOB: 1956/03/30  Age: 66 y.o. MRN: [id number] ? ?CC:  ?Chief Complaint  ?Patient presents with  ? Cough  ?  Negative COVID 01/19/22 prior to visit  ?Pt reports Monday night started coughing, has sinus pressure that is causing her glasses to be painful on her nose, notes she has hxt COPD wants to try and get ahead of this. Notes she is trying to sleep elevated but has been difficult no other sxs noted at this time   ? ? ?HPI ?Bethany Clarke presents for  ? ?Cough: ?Started 3 nights ago.  ?No fever. No sick contacts. Sinus congestion, face pain moving to chest, more chest congestion. Disocolored phlegm.  ?Wheezing after cough - some nighttime wakening with cough.  ?Neg covid test today.  ?Albuterol 3 times per day (prior once per week)  ?Tx: tussin. No relief with tessalon.  ? ?History of COPD Gold 2,  No daily meds.  Inhalers discussed in the past but has been declined. Last copd flare 09/23/21. Improved with doxy, prednisone - prolonged taper.  ? ?History ?Patient Active Problem List  ? Diagnosis Date Noted  ? Chest pain, atypical 03/17/2016  ? Paresthesia 07/14/2013  ? Bilateral foot pain 05/01/2013  ? Subclavian artery stenosis, left (HCC)   ? Subclavian steal syndrome 03/16/2013  ? Subclavian arterial stenosis ([place]) 03/11/2013  ? Carotid stenosis 03/11/2013  ? Hyperlipidemia with target LDL less than 70 03/11/2013  ? GERD (gastroesophageal reflux disease)   ? Hypothyroid   ? Dizziness   ? Syncope, near   ? Heart burn   ? Hypothyroidism 05/28/2007  ? COPD GOLD II with reversibility  05/28/2007  ? ?Past Medical History:  ?Diagnosis Date  ? Atherosclerosis 11/14/2005  ? carotid US - mild calcific non-stenotic plaque bilaterally  ? Carotid bruit 02/05/2009  ? Doppler - R/L ICAs 0-49% diameter reductin (velocities suggest low-mid range); L subclavian 0-49%  diameter reduction  ? Chronic bronchitis ([place])   ? "growing up; not anymore" (03/14/2013)  ? Colon polyp    ? hyperplastic  ? Costochondritis   ? Dizziness   ? Emphysema of lung ([place])   ? GERD (gastroesophageal reflux disease)   ? H. pylori infection   ? Heart burn   ? Hemorrhoids   ? Hypothyroid   ? Lipidemia   ? Pneumonia ~ 2009  ? Subclavian artery stenosis, left (HCC)   ? subclavian steal physiology status post PTA and stenting  ? Syncope, near   ? ?Past Surgical History:  ?Procedure Laterality Date  ? ABDOMINAL HYSTERECTOMY  1999  ? BILATERAL UPPER EXTREMITY ANGIOGRAM N/A 03/14/2013  ? Procedure: BILATERAL UPPER EXTREMITY ANGIOGRAM;  Surgeon: Lorretta Harp, MD;  Location[facility] CATH LAB;  Service: Cardiovascular;  Laterality: N/A;  ? BREAST BIOPSY Right 1990's  ? "benign" (03/14/2013)  ? CARDIAC CATHETERIZATION  03/03/2013  ? DILATION AND CURETTAGE OF UTERUS  1980's  ? KNEE ARTHROSCOPY WITH MENISCAL REPAIR Left 07/21/2021  ? Procedure: Arthroscopic debridement, arthroscopic debridement of bucket-handle component of lateral meniscus tear, and arthroscopically-assisted repair of lateral meniscus root tear, left knee. ;  Surgeon: Corky Mull, MD;  Location: [facility] ORS;  Service: Orthopedics;  Laterality: Left;  ? LEFT HEART CATHETERIZATION WITH CORONARY ANGIOGRAM Bilateral 03/03/2013  ? Procedure: LEFT HEART CATHETERIZATION WITH CORONARY ANGIOGRAM;  Surgeon: Troy Sine, MD;  Location: [facility] CATH LAB;  Service: Cardiovascular;  Laterality: Bilateral;  ? PERCUTANEOUS STENT INTERVENTION  03/14/2013  ?  Procedure: PERCUTANEOUS STENT INTERVENTION;  Surgeon: Lorretta Harp, MD;  Location: [facility] CATH LAB;  Service: Cardiovascular;;  ? SUBCLAVIAN ARTERY STENT Left 03/14/2013  ?  for subclavian steal/claudication (03/14/2013)  ? TUBAL LIGATION  1980's  ? ?No Known Allergies ?Prior to Admission medications   ?Medication Sig Start Date End Date Taking? Authorizing Provider  ?albuterol (VENTOLIN HFA) 108 (90 Base) MCG/ACT inhaler INHALE 1-2 PUFFS INTO THE LUNGS EVERY 4 (FOUR) HOURS AS NEEDED FOR WHEEZING OR SHORTNESS OF BREATH. 12/22/20  Yes  Wendie Agreste, MD  ?aspirin EC 81 MG tablet Take 81 mg by mouth daily. Swallow whole.   Yes [provider]  ?atorvastatin (LIPITOR) 40 MG tablet TAKE 1 TABLET BY MOUTH EVERY DAY 09/13/21  Yes Midge Minium, MD  ?clopidogrel (PLAVIX) 75 MG tablet TAKE 1 TABLET BY MOUTH EVERY DAY 09/20/21  Yes Wendie Agreste, MD  ?FLUoxetine (PROZAC) 20 MG tablet TAKE 1 TABLET BY MOUTH EVERY DAY 11/23/21  Yes Wendie Agreste, MD  ?ibuprofen (ADVIL) 200 MG tablet Take 4 tablets (800 mg total) by mouth every 6 (six) hours as needed for mild pain (pain.). 07/21/21  Yes Poggi, Marshall Cork, MD  ?SYNTHROID 100 MCG tablet TAKE 1 TABLET BY MOUTH DAILY BEFORE BREAKFAST. 09/13/21  Yes Midge Minium, MD  ?clonazePAM (KLONOPIN) 0.5 MG tablet TAKE 1 TABLET BY MOUTH TWICE A DAY AS NEEDED FOR ANXIETY 09/29/21   Wendie Agreste, MD  ?dextromethorphan-guaiFENesin [facility] DM) 30-600 MG 12hr tablet Take 1 tablet by mouth 2 (two) times daily. ?Patient not taking: Reported on 09/23/2021 08/22/21   Maximiano Coss, NP  ?doxycycline (VIBRA-TABS) 100 MG tablet Take 1 tablet (100 mg total) by mouth 2 (two) times daily. 09/23/21   Maximiano Coss, NP  ?famotidine (PEPCID) 20 MG tablet Take by mouth. ?Patient not taking: Reported on 01/19/2022 08/24/21 08/24/22  [provider]  ?HYDROcodone bit-homatropine (HYCODAN) 5-1.5 MG/5ML syrup Take 5 mLs by mouth every 8 (eight) hours as needed for cough. 09/23/21   Maximiano Coss, NP  ?HYDROcodone-acetaminophen (NORCO/VICODIN) 5-325 MG tablet Take 1-2 tablets by mouth every 6 (six) hours as needed for moderate pain. 07/21/21 07/21/22  Poggi, Marshall Cork, MD  ? ?Social History  ? ?Socioeconomic History  ? Marital status: Married  ?  Spouse name: Eddie Dibbles  ? Number of children: 3  ? Years of education: Not on file  ? Highest education level: Some college, no degree  ?Occupational History  ? Occupation: Psychologist, prison and probation services  ?  Employer: [employer]  ?Tobacco Use  ? Smoking status: Former  ?  Packs/day:  0.50  ?  Years: 40.00  ?  Pack years: 20.00  ?  Types: Cigarettes  ?  Quit date: 02/11/2013  ?  Years since quitting: 8.9  ? Smokeless tobacco: Never  ?Substance and Sexual Activity  ? Alcohol use: No  ? Drug use: No  ? Sexual activity: Yes  ?  Comment: number of sex partners in the last 39 months  1  ?Other Topics Concern  ? Not on file  ?Social History Narrative  ? Daily caffeine. Married. Education: [employer]. Exercise: No.  ?   ? Patient is right-handed. She lives with her husband in a two level home. She drinks 2 cups of coffee a day. 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  ? ?Social Determinants of Health  ? ?Financial Resource Strain: Not on file  ?Food Insecurity: Not on file  ?Transportation Needs: Not on file  ?  Physical Activity: Not on file  ?Stress: Not on file  ?Social Connections: Not on file  ?Intimate Partner Violence: Not on file  ? ? ?Review of Systems ?Per HPI ? ?Objective:  ? ?Vitals:  ? 01/19/22 1058  ?BP: 132/78  ?Pulse: 78  ?Resp: 16  ?Temp: 98.8 ?F (37.1 ?C)  ?TempSrc: Temporal  ?SpO2: 93%  ?Weight: 181 lb 3.2 oz (82.2 kg)  ?Height: 5\' 5"  (1.651 m)  ? ? ? ?Physical Exam ?Vitals reviewed.  ?Constitutional:   ?   General: She is not in acute distress. ?   Appearance: She is well-developed.  ?HENT:  ?   Head: Normocephalic and atraumatic.  ?   Right Ear: Hearing, tympanic membrane, ear canal and external ear normal.  ?   Left Ear: Hearing, tympanic membrane, ear canal and external ear normal.  ?   Nose: Nose normal.  ?   Comments:  ?Bilateral maxillary sinus tenderness. ?   Mouth/Throat:  ?   Pharynx: No posterior oropharyngeal erythema.  ?Eyes:  ?   Conjunctiva/sclera: Conjunctivae normal.  ?   Pupils: Pupils are equal, round, and reactive to light.  ?Cardiovascular:  ?   Rate and Rhythm: Normal rate and regular rhythm.  ?   Heart sounds: Normal heart sounds. No murmur heard. ?Pulmonary:  ?   Effort: Pulmonary effort is normal.  ?   Breath sounds: Wheezing and rhonchi (Distant breath  sounds, few scattered rhonchi, expiratory wheeze.  No distress.  Speaking in full sentences.) present.  ?Skin: ?   General: Skin is warm and dry.  ?   Findings: No rash.  ?Neurological:  ?   Mental

## 2022-01-20 ENCOUNTER — Ambulatory Visit
Admission: RE | Admit: 2022-01-20 | Discharge: 2022-01-20 | Disposition: A | Discharge status: Home / Self Care | Payer: Medicare Other | Source: Ambulatory Visit | Attending: Family Medicine | Admitting: Family Medicine

## 2022-01-20 DIAGNOSIS — Z1231 Encounter for screening mammogram for malignant neoplasm of breast: Secondary | ICD-10-CM

## 2022-01-30 ENCOUNTER — Ambulatory Visit (INDEPENDENT_AMBULATORY_CARE_PROVIDER_SITE_OTHER): Payer: Medicare Other | Admitting: Family Medicine

## 2022-01-30 ENCOUNTER — Encounter: Payer: Self-pay | Admitting: Family Medicine

## 2022-01-30 ENCOUNTER — Telehealth: Payer: Self-pay

## 2022-01-30 VITALS — BP 138/80 | HR 78 | Temp 98.3°F | Resp 17 | Ht 65.0 in | Wt 183.4 lb

## 2022-01-30 DIAGNOSIS — E039 Hypothyroidism, unspecified: Secondary | ICD-10-CM

## 2022-01-30 DIAGNOSIS — F418 Other specified anxiety disorders: Secondary | ICD-10-CM

## 2022-01-30 DIAGNOSIS — Z13820 Encounter for screening for osteoporosis: Secondary | ICD-10-CM

## 2022-01-30 DIAGNOSIS — G458 Other transient cerebral ischemic attacks and related syndromes: Secondary | ICD-10-CM

## 2022-01-30 DIAGNOSIS — Z1211 Encounter for screening for malignant neoplasm of colon: Secondary | ICD-10-CM

## 2022-01-30 DIAGNOSIS — E785 Hyperlipidemia, unspecified: Secondary | ICD-10-CM

## 2022-01-30 DIAGNOSIS — R030 Elevated blood-pressure reading, without diagnosis of hypertension: Secondary | ICD-10-CM | POA: Diagnosis not present

## 2022-01-30 DIAGNOSIS — J449 Chronic obstructive pulmonary disease, unspecified: Secondary | ICD-10-CM

## 2022-01-30 DIAGNOSIS — I6523 Occlusion and stenosis of bilateral carotid arteries: Secondary | ICD-10-CM

## 2022-01-30 DIAGNOSIS — E2839 Other primary ovarian failure: Secondary | ICD-10-CM

## 2022-01-30 MED ORDER — ATORVASTATIN CALCIUM 40 MG PO TABS: 40.0000 mg | ORAL_TABLET | Freq: Every day | ORAL | 2 refills | Status: DC

## 2022-01-30 MED ORDER — FLUOXETINE HCL 40 MG PO CAPS: 40.0000 mg | ORAL_CAPSULE | Freq: Every day | ORAL | 3 refills | Status: DC

## 2022-01-30 MED ORDER — SYNTHROID 100 MCG PO TABS: 100.0000 ug | ORAL_TABLET | Freq: Every day | ORAL | 2 refills | Status: DC

## 2022-01-30 NOTE — Progress Notes (Signed)
? ?Subjective:  ?Patient ID: Bethany Clarke, female    DOB: 10/30/1955  Age: 66 y.o. MRN: [id number] ? ?CC:  ?Chief Complaint  ?Patient presents with  ? Hypertension  ?  Pt here for recheck on BP pt reports home reading is high   ? ? ?HPI ?Bethany Clarke presents for  ? ?Elevated blood pressure ?Blood pressure check on March 30.  Normal in office at that time.  Elevated on home readings only.  Instructions given on appropriate home blood pressure monitoring, and timing for most accurate results. ?No current antihypertensives. ?Some increased stress with spouse's health. No CP/HA.  ?Home readings: 169/96, 186/98, 161/83.  ?Home meter today 169/96 (vs 138/80 in office).  ?BP Readings from Last 3 Encounters:  ?01/30/22 138/80  ?01/19/22 132/78  ?01/12/22 132/80  ? ?Lab Results  ?Component Value Date  ? CREATININE 0.66 01/12/2022  ? ?COPD: ?Recently treated for COPD flare April 6 with prednisone taper, doxycycline.  Prior exacerbation in December 2022, November 2022.  Has albuterol inhaler as needed.  Other inhalers have been discussed in the past and declined. ?No tobacco in 7 years.  Possibility of asthma combined with COPD as she had some reversibility/improvement with albuterol previously.  Pulmonary follow-up was discussed last year. Has not seen.  ?Feeling better after recent flare.  ?Back to baseline - albuterol about 2 times per month only with certain physical activity. Declines pulmonary eval or med change at this time.  ? ? ?Hypothyroidism: ?Lab Results  ?Component Value Date  ? TSH 0.56 01/12/2022  ? ?Taking medication daily.  Synthroid 100 mcg daily recent labs stable as above.  Brand-name only as she has had variability in control on generic in the past. ?No new hot or cold intolerance. No new hair or skin changes, heart palpitations or new fatigue. No new weight changes.  ? ?Subclavian steal syndrome ?History of subclavian artery stenosis, carotid artery stenosis.  Cardiologist Dr. Allyson SabalBerry and Dr.  Tresa EndoKelly.  Plavix 75 mg daily, Lipitor 40 mg daily, aspirin daily. ? No bleeding or new side effects with meds.  ?Lab Results  ?Component Value Date  ? CHOL 148 01/12/2022  ? HDL 63.70 01/12/2022  ? LDLCALC 57 01/12/2022  ? TRIG 135.0 01/12/2022  ? CHOLHDL 2 01/12/2022  ? ? ? ?Depression with anxiety: ?Treated with fluoxetine 20 mg daily with rare/intermittent use of Klonopin in the past. Feels like fluoxetine doing ok. Taking klonopin 1-2 times for week. Some stress as above.  ?Controlled substance database reviewed.  Last clonazepam prescription 09/29/2021, #20.  ? ? ?  01/12/2022  ?  1:41 PM 08/22/2021  ?  1:31 PM 12/22/2020  ?  3:12 PM 10/23/2019  ? 11:11 AM 01/02/2019  ?  4:27 PM  ?Depression screen PHQ 2/9  ?Decreased Interest 2 3 0 0 0  ?Down, Depressed, Hopeless 1 2 0 0 0  ?PHQ - 2 Score 3 5 0 0 0  ?Altered sleeping 1 2     ?Tired, decreased energy 2 3     ?Change in appetite 0 0     ?Feeling bad or failure about yourself  0 0     ?Trouble concentrating 1 3     ?Moving slowly or fidgety/restless 0 2     ?Suicidal thoughts 0 0     ?PHQ-9 Score 7 15     ?Difficult doing work/chores  Not difficult at all     ? ?HM: ?Complete Hysterectomy, oopherectomy  at 8143 - endometriosis ?Colonoscopy:  referred ?Agrees to BMD referral. No FH of osteoporosis.  ? ?History ?Patient Active Problem List  ? Diagnosis Date Noted  ? Chest pain, atypical 03/17/2016  ? Paresthesia 07/14/2013  ? Bilateral foot pain 05/01/2013  ? Subclavian artery stenosis, left (HCC)   ? Subclavian steal syndrome 03/16/2013  ? Subclavian arterial stenosis (HCC) 03/11/2013  ? Carotid stenosis 03/11/2013  ? Hyperlipidemia with target LDL less than 70 03/11/2013  ? GERD (gastroesophageal reflux disease)   ? Hypothyroid   ? Dizziness   ? Syncope, near   ? Heart burn   ? Hypothyroidism 05/28/2007  ? COPD GOLD II with reversibility  05/28/2007  ? ?Past Medical History:  ?Diagnosis Date  ? Atherosclerosis 11/14/2005  ? carotid US - mild calcific non-stenotic plaque  bilaterally  ? Carotid bruit 02/05/2009  ? Doppler - R/L ICAs 0-49% diameter reductin (velocities suggest low-mid range); L subclavian 0-49%  diameter reduction  ? Chronic bronchitis (HCC)   ? "growing up; not anymore" (03/14/2013)  ? Colon polyp   ? hyperplastic  ? Costochondritis   ? Dizziness   ? Emphysema of lung (HCC)   ? GERD (gastroesophageal reflux disease)   ? H. pylori infection   ? Heart burn   ? Hemorrhoids   ? Hypothyroid   ? Lipidemia   ? Pneumonia ~ 2009  ? Subclavian artery stenosis, left (HCC)   ? subclavian steal physiology status post PTA and stenting  ? Syncope, near   ? ?Past Surgical History:  ?Procedure Laterality Date  ? ABDOMINAL HYSTERECTOMY  1999  ? BILATERAL UPPER EXTREMITY ANGIOGRAM N/A 03/14/2013  ? Procedure: BILATERAL UPPER EXTREMITY ANGIOGRAM;  Surgeon: [name], MD;  Location[facility] CATH LAB;  Service: Cardiovascular;  Laterality: N/A;  ? BREAST BIOPSY Right 1990's  ? "benign" (03/14/2013)  ? CARDIAC CATHETERIZATION  03/03/2013  ? DILATION AND CURETTAGE OF UTERUS  1980's  ? KNEE ARTHROSCOPY WITH MENISCAL REPAIR Left 07/21/2021  ? Procedure: Arthroscopic debridement, arthroscopic debridement of bucket-handle component of lateral meniscus tear, and arthroscopically-assisted repair of lateral meniscus root tear, left knee. ;  Surgeon: Christena Flake, MD;  Location: [facility] ORS;  Service: Orthopedics;  Laterality: Left;  ? LEFT HEART CATHETERIZATION WITH CORONARY ANGIOGRAM Bilateral 03/03/2013  ? Procedure: LEFT HEART CATHETERIZATION WITH CORONARY ANGIOGRAM;  Surgeon: Lennette Bihari, MD;  Location: [facility] CATH LAB;  Service: Cardiovascular;  Laterality: Bilateral;  ? PERCUTANEOUS STENT INTERVENTION  03/14/2013  ? Procedure: PERCUTANEOUS STENT INTERVENTION;  Surgeon: [name], MD;  Location: [facility] CATH LAB;  Service: Cardiovascular;;  ? SUBCLAVIAN ARTERY STENT Left 03/14/2013  ?  for subclavian steal/claudication (03/14/2013)  ? TUBAL LIGATION  1980's  ? ?No Known Allergies ?Prior to Admission  medications   ?Medication Sig Start Date End Date Taking? Authorizing Provider  ?albuterol (VENTOLIN HFA) 108 (90 Base) MCG/ACT inhaler INHALE 1-2 PUFFS INTO THE LUNGS EVERY 4 (FOUR) HOURS AS NEEDED FOR WHEEZING OR SHORTNESS OF BREATH. 12/22/20  Yes [name], MD  ?aspirin EC 81 MG tablet Take 81 mg by mouth daily. Swallow whole.   Yes [provider]  ?atorvastatin (LIPITOR) 40 MG tablet TAKE 1 TABLET BY MOUTH EVERY DAY 09/13/21  Yes Sheliah Hatch, MD  ?clopidogrel (PLAVIX) 75 MG tablet TAKE 1 TABLET BY MOUTH EVERY DAY 09/20/21  Yes [name], MD  ?doxycycline (VIBRA-TABS) 100 MG tablet Take 1 tablet (100 mg total) by mouth 2 (two) times daily. 01/19/22  Yes [name], MD  ?FLUoxetine [facility])  20 MG tablet TAKE 1 TABLET BY MOUTH EVERY DAY 11/23/21  Yes [name], MD  ?HYDROcodone bit-homatropine (HYCODAN) 5-1.5 MG/5ML syrup Take 5 mLs by mouth every 8 (eight) hours as needed for cough. 01/19/22  Yes [name], MD  ?ibuprofen (ADVIL) 200 MG tablet Take 4 tablets (800 mg total) by mouth every 6 (six) hours as needed for mild pain (pain.). 07/21/21  Yes Poggi, Excell Seltzer, MD  ?predniSONE (DELTASONE) 20 MG tablet 3 by mouth for 3 days, then 2 by mouth for 2 days, then 1 by mouth for 2 days, then 1/2 by mouth for 2 days. 01/19/22  Yes [name], MD  ?SYNTHROID 100 MCG tablet TAKE 1 TABLET BY MOUTH DAILY BEFORE BREAKFAST. 09/13/21  Yes Sheliah Hatch, MD  ? ?Social History  ? ?Socioeconomic History  ? Marital status: Married  ?  Spouse name: Renae Fickle  ? Number of children: 3  ? Years of education: Not on file  ? Highest education level: Some college, no degree  ?Occupational History  ? Occupation: Interior and spatial designer  ?  Employer: [employer]  ?Tobacco Use  ? Smoking status: Former  ?  Packs/day: 0.50  ?  Years: 40.00  ?  Pack years: 20.00  ?  Types: Cigarettes  ?  Quit date: 02/11/2013  ?  Years since quitting: 8.9  ? Smokeless tobacco: Never  ?Substance and Sexual  Activity  ? Alcohol use: No  ? Drug use: No  ? Sexual activity: Yes  ?  Comment: number of sex partners in the last 12 months  1  ?Other Topics Concern  ? Not on file  ?Social History Narrative  ? Daily caffeine.

## 2022-01-30 NOTE — Patient Instructions (Addendum)
Try fluoxetine higher dose. Keep me posted on how that does.  ?If blood pressure is running higher, we may need to start low dose amlodipine.  ?Nurse visit in 2 weeks for blood pressure.  ?Physical next 3 months.  ?Thanks for coming in today - let me know if there are questions.  ? ? ? ?

## 2022-01-30 NOTE — Telephone Encounter (Addendum)
Patient wanted to know if she can get labs scheduled a week prior to her physical in July. ?

## 2022-01-31 ENCOUNTER — Telehealth: Payer: Self-pay

## 2022-01-31 DIAGNOSIS — E785 Hyperlipidemia, unspecified: Secondary | ICD-10-CM

## 2022-01-31 DIAGNOSIS — Z0000 Encounter for general adult medical examination without abnormal findings: Secondary | ICD-10-CM

## 2022-01-31 DIAGNOSIS — E039 Hypothyroidism, unspecified: Secondary | ICD-10-CM

## 2022-01-31 DIAGNOSIS — Z131 Encounter for screening for diabetes mellitus: Secondary | ICD-10-CM

## 2022-01-31 DIAGNOSIS — Z130 Encounter for screening for diseases of the blood and blood-forming organs and certain disorders involving the immune mechanism: Secondary | ICD-10-CM

## 2022-01-31 NOTE — Telephone Encounter (Signed)
Called pt to schedule labs prior to physical as requested by pt  ?

## 2022-02-02 ENCOUNTER — Ambulatory Visit
Admission: RE | Admit: 2022-02-02 | Discharge: 2022-02-02 | Disposition: A | Discharge status: Home / Self Care | Payer: Medicare Other | Source: Ambulatory Visit | Attending: Family Medicine | Admitting: Family Medicine

## 2022-02-02 DIAGNOSIS — E2839 Other primary ovarian failure: Secondary | ICD-10-CM

## 2022-02-02 DIAGNOSIS — Z13820 Encounter for screening for osteoporosis: Secondary | ICD-10-CM

## 2022-02-14 ENCOUNTER — Ambulatory Visit (INDEPENDENT_AMBULATORY_CARE_PROVIDER_SITE_OTHER): Payer: Medicare Other | Admitting: Registered Nurse

## 2022-02-14 NOTE — Progress Notes (Signed)
Pt presented today for BP no physical sxs, no concerns from pt bp 144/78 Rt 140/76 Lt  ?

## 2022-05-12 ENCOUNTER — Encounter: Payer: Self-pay | Admitting: Family Medicine

## 2022-05-12 ENCOUNTER — Ambulatory Visit (INDEPENDENT_AMBULATORY_CARE_PROVIDER_SITE_OTHER): Payer: Medicare Other | Admitting: Family Medicine

## 2022-05-12 VITALS — BP 120/82 | HR 64 | Temp 98.2°F | Resp 20 | Ht 65.0 in | Wt 178.2 lb

## 2022-05-12 DIAGNOSIS — E039 Hypothyroidism, unspecified: Secondary | ICD-10-CM

## 2022-05-12 DIAGNOSIS — R110 Nausea: Secondary | ICD-10-CM | POA: Diagnosis not present

## 2022-05-12 DIAGNOSIS — I6523 Occlusion and stenosis of bilateral carotid arteries: Secondary | ICD-10-CM | POA: Diagnosis not present

## 2022-05-12 DIAGNOSIS — Z0000 Encounter for general adult medical examination without abnormal findings: Secondary | ICD-10-CM

## 2022-05-12 DIAGNOSIS — F418 Other specified anxiety disorders: Secondary | ICD-10-CM

## 2022-05-12 DIAGNOSIS — G458 Other transient cerebral ischemic attacks and related syndromes: Secondary | ICD-10-CM | POA: Diagnosis not present

## 2022-05-12 DIAGNOSIS — E785 Hyperlipidemia, unspecified: Secondary | ICD-10-CM | POA: Diagnosis not present

## 2022-05-12 DIAGNOSIS — Z23 Encounter for immunization: Secondary | ICD-10-CM | POA: Diagnosis not present

## 2022-05-12 DIAGNOSIS — Z131 Encounter for screening for diabetes mellitus: Secondary | ICD-10-CM

## 2022-05-12 LAB — COMPREHENSIVE METABOLIC PANEL
ALT: 21 U/L (ref 0–35)
AST: 19 U/L (ref 0–37)
Albumin: 5 g/dL (ref 3.5–5.2)
Alkaline Phosphatase: 85 U/L (ref 39–117)
BUN: 10 mg/dL (ref 6–23)
CO2: 31 mEq/L (ref 19–32)
Calcium: 10.5 mg/dL (ref 8.4–10.5)
Chloride: 98 mEq/L (ref 96–112)
Creatinine, Ser: 0.77 mg/dL (ref 0.40–1.20)
GFR: 80.73 mL/min (ref >60.00)
Glucose, Bld: 88 mg/dL (ref 70–99)
Potassium: 4.7 mEq/L (ref 3.5–5.1)
Sodium: 135 mEq/L (ref 135–145)
Total Bilirubin: 0.4 mg/dL (ref 0.2–1.2)
Total Protein: 8.3 g/dL (ref 6.0–8.3)

## 2022-05-12 LAB — CBC
HCT: 42.4 % (ref 36.0–46.0)
Hemoglobin: 14 g/dL (ref 12.0–15.0)
MCHC: 33.1 g/dL (ref 30.0–36.0)
MCV: 98.3 fl (ref 78.0–100.0)
Platelets: 277 10*3/uL (ref 150.0–400.0)
RBC: 4.32 Mil/uL (ref 3.87–5.11)
RDW: 12.8 % (ref 11.5–15.5)
WBC: 4.6 10*3/uL (ref 4.0–10.5)

## 2022-05-12 LAB — LIPASE: Lipase: 31 U/L (ref 11.0–59.0)

## 2022-05-12 MED ORDER — CLONAZEPAM 0.5 MG PO TABS: 0.5000 mg | ORAL_TABLET | Freq: Two times a day (BID) | ORAL | 1 refills | Status: DC | PRN

## 2022-05-12 NOTE — Progress Notes (Signed)
Subjective:  Patient ID: Bethany Clarke, female    DOB: 29-May-1956  Age: 66 y.o. MRN: [id number]  CC:  Chief Complaint  Patient presents with   Annual Exam    HPI Bethany Clarke presents for Annual Exam  PCP, me Neuro Dr. Everlena Cooper Cardiology Dr. Tresa Endo, Dr. Allyson Sabal.  History of subclavian steal - left subclavian artery stenosis with retrograde filling of left vertebral system, stenting in 2014.  Treated with aspirin and Plavix.  Carotid Doppler January with slight progression of R ICA stenosis, repeat 12 months. Ortho Dr. Joice Lofts, left knee pain, meniscus tear  Chronic medical issues were discussed at her April 17 visit. TSH, CMP, lipid panel all stable in March.  Hypertension: Elevated blood pressure was noted at April visit.  Home meter reading higher than in office.  Option of low-dose amlodipine discussed but declined at that time.  Plan for nurse visit for blood pressure in 2 weeks, BP 144/78, 140/76 on 02/14/2022.  Home readings: 157/83 in afternoon after work. Higher if stressed. Unknown morning readings. Feel like more situational.  BP Readings from Last 3 Encounters:  05/12/22 120/82  02/14/22 140/76  01/30/22 138/80   Lab Results  Component Value Date   CREATININE 0.66 01/12/2022    Depression with anxiety Decreased control given situational stressors, family health stressors at her April visit, was requiring benzodiazepine a few times per week at that time.  Fluoxetine increased to 40 mg daily.  Klonopin if needed. Higher dose has absolutely helped, less anxious. No new side effects, would like to continue higher dose. Ran out of klonopin for 6 months. Last filled 20 in 09/2021. Still meeting with therapist intermittently.     05/12/2022    9:13 AM 01/12/2022    1:41 PM 08/22/2021    1:31 PM 12/22/2020    3:12 PM 10/23/2019   11:11 AM  Depression screen PHQ 2/9  Decreased Interest 2 2 3  0 0  Down, Depressed, Hopeless 2 1 2  0 0  PHQ - 2 Score 4 3 5  0 0  Altered  sleeping 2 1 2     Tired, decreased energy 2 2 3     Change in appetite 3 0 0    Feeling bad or failure about yourself  2 0 0    Trouble concentrating 0 1 3    Moving slowly or fidgety/restless 0 0 2    Suicidal thoughts 0 0 0    PHQ-9 Score 13 7 15     Difficult doing work/chores   Not difficult at all      Health Maintenance  Topic Date Due   COVID-19 Vaccine (4 - Pfizer series) 05/28/2022 (Originally 09/18/2020)   Zoster Vaccines- Shingrix (1 of 2) 08/12/2022 (Originally 07/18/2006)   Pneumonia Vaccine 91+ Years old (2 - PCV) 01/13/2023 (Originally 07/18/2021)   Hepatitis C Screening  01/13/2023 (Originally 07/18/1974)   HIV Screening  01/13/2023 (Originally 07/19/1971)   COLONOSCOPY (Pts 45-3yrs Insurance coverage will need to be confirmed)  05/13/2023 (Originally 02/24/2019)   INFLUENZA VACCINE  05/16/2022   MAMMOGRAM  01/21/2024   TETANUS/TDAP  04/12/2027   DEXA SCAN  Completed   HPV VACCINES  Aged Out   PAP SMEAR-Modifier  Discontinued  Colonoscopy with Dr. 58yr in 2010.  Sessile polyp with small hemorrhoids.  Repeat in 5 to 10 years.  Referred to GI last visit, has not scheduled.  Mammogram: 01/20/2022, negative. Pap testing: few years ago in [place] at 03/22/2024.  Bone density, 02/02/2022, osteopenia with T  score of -2.2 lumbar spine, -1.7 right femur, -1.5 dual femur. Takes 1000u vit D per day, calcium - in diet only.   Immunization History  Administered Date(s) Administered   Influenza, Seasonal, Injecte, Preservative Fre 08/18/2017   Influenza,inj,Quad PF,6+ Mos 12/01/2015, 08/26/2017, 07/28/2018, 06/08/2019, 06/20/2020   PFIZER(Purple Top)SARS-COV-2 Vaccination 12/29/2019, 01/19/2020, 07/24/2020   Pneumococcal Polysaccharide-23 10/17/2003   Rotavirus Monovalent 10/16/2000   Td 10/16/2000   Tdap 03/15/2007, 04/11/2017  Pneumonia vaccination - agrees to today.  COVID-19 booster plans on repeat next month. Most recent booster late last year.  Shingrix Hep C/HIV screening  No  results found. Optho - in March - new glasses.   Dental: No recent visit - plans to schedule.   Alcohol: none  Tobacco: none  Exercise: 1 mile walking per day with dog, up to 2 miles per day.   Feels nauseous if not eating for few hours, and if after eating something sweet, feels jittery. No vomiting. FH of diabetes  History Patient Active Problem List   Diagnosis Date Noted   Chest pain, atypical 03/17/2016   Paresthesia 07/14/2013   Bilateral foot pain 05/01/2013   Subclavian artery stenosis, left (HCC)    Subclavian steal syndrome 03/16/2013   Subclavian arterial stenosis (HCC) 03/11/2013   Carotid stenosis 03/11/2013   Hyperlipidemia with target LDL less than 70 03/11/2013   GERD (gastroesophageal reflux disease)    Hypothyroid    Dizziness    Syncope, near    Heart burn    Hypothyroidism 05/28/2007   COPD GOLD II with reversibility  05/28/2007   Past Medical History:  Diagnosis Date   Atherosclerosis 11/14/2005   carotid US - mild calcific non-stenotic plaque bilaterally   Carotid bruit 02/05/2009   Doppler - R/L ICAs 0-49% diameter reductin (velocities suggest low-mid range); L subclavian 0-49%  diameter reduction   Chronic bronchitis (HCC)    "growing up; not anymore" (03/14/2013)   Colon polyp    hyperplastic   Costochondritis    Dizziness    Emphysema of lung (HCC)    GERD (gastroesophageal reflux disease)    H. pylori infection    Heart burn    Hemorrhoids    Hypothyroid    Lipidemia    Pneumonia ~ 2009   Subclavian artery stenosis, left (HCC)    subclavian steal physiology status post PTA and stenting   Syncope, near    Past Surgical History:  Procedure Laterality Date   ABDOMINAL HYSTERECTOMY  1999   BILATERAL UPPER EXTREMITY ANGIOGRAM N/A 03/14/2013   Procedure: BILATERAL UPPER EXTREMITY ANGIOGRAM;  Surgeon: [name], MD;  Location: [facility] CATH LAB;  Service: Cardiovascular;  Laterality: N/A;   BREAST BIOPSY Right 1990's   "benign"  (03/14/2013)   CARDIAC CATHETERIZATION  03/03/2013   DILATION AND CURETTAGE OF UTERUS  1980's   KNEE ARTHROSCOPY WITH MENISCAL REPAIR Left 07/21/2021   Procedure: Arthroscopic debridement, arthroscopic debridement of bucket-handle component of lateral meniscus tear, and arthroscopically-assisted repair of lateral meniscus root tear, left knee. ;  Surgeon: Christena Flake, MD;  Location: [facility] ORS;  Service: Orthopedics;  Laterality: Left;   LEFT HEART CATHETERIZATION WITH CORONARY ANGIOGRAM Bilateral 03/03/2013   Procedure: LEFT HEART CATHETERIZATION WITH CORONARY ANGIOGRAM;  Surgeon: Lennette Bihari, MD;  Location[facility] CATH LAB;  Service: Cardiovascular;  Laterality: Bilateral;   PERCUTANEOUS STENT INTERVENTION  03/14/2013   Procedure: PERCUTANEOUS STENT INTERVENTION;  Surgeon: [name], MD;  Location: [facility] CATH LAB;  Service: Cardiovascular;;   SUBCLAVIAN ARTERY STENT  Left 03/14/2013    for subclavian steal/claudication (03/14/2013)   TUBAL LIGATION  1980's   No Known Allergies Prior to Admission medications   Medication Sig Start Date End Date Taking? Authorizing Provider  albuterol (VENTOLIN HFA) 108 (90 Base) MCG/ACT inhaler INHALE 1-2 PUFFS INTO THE LUNGS EVERY 4 (FOUR) HOURS AS NEEDED FOR WHEEZING OR SHORTNESS OF BREATH. 12/22/20   Shade FloodGreene, Serrena Linderman R, MD  aspirin EC 81 MG tablet Take 81 mg by mouth daily. Swallow whole.    [provider]  atorvastatin (LIPITOR) 40 MG tablet Take 1 tablet (40 mg total) by mouth daily. 01/30/22   Shade FloodGreene, Avinash Maltos R, MD  clopidogrel (PLAVIX) 75 MG tablet TAKE 1 TABLET BY MOUTH EVERY DAY 09/20/21   Shade FloodGreene, Shae Hinnenkamp R, MD  doxycycline (VIBRA-TABS) 100 MG tablet Take 1 tablet (100 mg total) by mouth 2 (two) times daily. 01/19/22   Shade FloodGreene, Virl Coble R, MD  FLUoxetine (PROZAC) 40 MG capsule Take 1 capsule (40 mg total) by mouth daily. 01/30/22   Shade FloodGreene, Reannon Candella R, MD  HYDROcodone bit-homatropine (HYCODAN) 5-1.5 MG/5ML syrup Take 5 mLs by mouth every 8 (eight) hours as  needed for cough. 01/19/22   Shade FloodGreene, Kenyatta Gloeckner R, MD  predniSONE (DELTASONE) 20 MG tablet 3 by mouth for 3 days, then 2 by mouth for 2 days, then 1 by mouth for 2 days, then 1/2 by mouth for 2 days. 01/19/22   Shade FloodGreene, Daemien Fronczak R, MD  SYNTHROID 100 MCG tablet Take 1 tablet (100 mcg total) by mouth daily before breakfast. 01/30/22   Shade FloodGreene, Shantrell Placzek R, MD   Social History   Socioeconomic History   Marital status: Married    Spouse name: Renae Fickleaul   Number of children: 3   Years of education: Not on file   Highest education level: Some college, no degree  Occupational History   Occupation: Orthoptistacility Mananger    Employer: [employer]  Tobacco Use   Smoking status: Former    Packs/day: 0.50    Years: 40.00    Total pack years: 20.00    Types: Cigarettes    Quit date: 02/11/2013    Years since quitting: 9.2   Smokeless tobacco: Never  Substance and Sexual Activity   Alcohol use: No   Drug use: No   Sexual activity: Yes    Comment: number of sex partners in the last 12 months  1  Other Topics Concern   Not on file  Social History Narrative   Daily caffeine. Married. Education: [employer]. Exercise: No.      Patient is right-handed. She lives with her husband in a two level home. She drinks 2 cups of coffee a day. 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   Social Determinants of Health   Financial Resource Strain: Not on file  Food Insecurity: Not on file  Transportation Needs: Not on file  Physical Activity: Not on file  Stress: Not on file  Social Connections: Not on file  Intimate Partner Violence: Not on file    Review of Systems 13 point review of systems per patient health survey noted.  Negative other than as indicated above or in HPI.    Objective:   Vitals:   05/12/22 0918  BP: 120/82  Pulse: 64  Resp: 20  Temp: 98.2 F (36.8 C)  SpO2: 93%  Weight: 178 lb 3.2 oz (80.8 kg)  Height: 5\' 5"  (1.651 m)     Physical Exam Constitutional:      Appearance: She  is well-developed.  HENT:     Head: Normocephalic and atraumatic.     Right Ear: External ear normal.     Left Ear: External ear normal.  Eyes:     Conjunctiva/sclera: Conjunctivae normal.     Pupils: Pupils are equal, round, and reactive to light.  Neck:     Thyroid: No thyromegaly.  Cardiovascular:     Rate and Rhythm: Normal rate and regular rhythm.     Heart sounds: Normal heart sounds. No murmur heard. Pulmonary:     Effort: Pulmonary effort is normal. No respiratory distress.     Breath sounds: Normal breath sounds. No wheezing.  Abdominal:     General: Bowel sounds are normal.     Palpations: Abdomen is soft.     Tenderness: There is no abdominal tenderness.  Musculoskeletal:        General: No tenderness. Normal range of motion.     Cervical back: Normal range of motion and neck supple.  Lymphadenopathy:     Cervical: No cervical adenopathy.  Skin:    General: Skin is warm and dry.     Findings: No rash.  Neurological:     Mental Status: She is alert and oriented to person, place, and time.  Psychiatric:        Behavior: Behavior normal.        Thought Content: Thought content normal.        Assessment & Plan:  Bethany Clarke is a 66 y.o. female . Annual physical exam  - -anticipatory guidance as below in AVS, screening labs above. Health maintenance items as above in HPI discussed/recommended as applicable.   Hypothyroidism, unspecified type  -Stable on most recent testing.  No med changes for now.  Depression with anxiety - Plan: clonazePAM (KLONOPIN) 0.5 MG tablet  -Improved with higher dose fluoxetine, continue same, Klonopin refilled for as needed dosing.  Hyperlipidemia, unspecified hyperlipidemia type  -Up-to-date on labs, tolerating Lipitor, continue same.  Subclavian steal syndrome  -Continue routine follow-up with cardiology.  Recent imaging with minimal changes.  Screening for diabetes mellitus  -Check CMP.  Glucose has looked okay on  most recent testing.  Nausea - Plan: CBC, Comprehensive metabolic panel, Lipase  -New concern, she was concerned about possible diabetes but glucose has been normal as above.  May be related to insufficient meals or delaying meal too far in the morning.  Regular meals with breakfast first in the morning discussed.  Check CBC, CMP, lipase.  RTC precautions if symptoms persist with meal adjustments.  Need for pneumococcal vaccination - Plan: Pneumococcal conjugate vaccine 20-valent (Prevnar 20)   Meds ordered this encounter  Medications   clonazePAM (KLONOPIN) 0.5 MG tablet    Sig: Take 1 tablet (0.5 mg total) by mouth 2 (two) times daily as needed for anxiety.    Dispense:  20 tablet    Refill:  1   Patient Instructions  Glad to hear that anxiety has improved. Continue higher dose fluoxetine for now, klonopin refilled if needed. Follow up in 6 months, sooner if any worsening.   Continue to work stress management and discuss with therapist. If blood pressures remain elevated, would consider repeat low dose of beta blocker like metoprolol or other med amlodipine.  Let me know.   Call gastroenterology for colonoscopy appointment.   If last pap test was with HPV testing, then ok to repeat in 5 years. Please send me a report.  Continue vitamin D, but make sure you are  getting 1200-1500mg  of calcium per day.   Schedule dental visit.   I will check some labs, but follow up if nausea continues. Try to eat regular meals and breakfast earlier each day.   Return to the clinic or go to the nearest emergency room if any of your symptoms worsen or new symptoms occur.  Health Maintenance After Age 1 After age 55, you are at a higher risk for certain long-term diseases and infections as well as injuries from falls. Falls are a major cause of broken bones and head injuries in people who are older than age 81. Getting regular preventive care can help to keep you healthy and well. Preventive care  includes getting regular testing and making lifestyle changes as recommended by your health care provider. Talk with your health care provider about: Which screenings and tests you should have. A screening is a test that checks for a disease when you have no symptoms. A diet and exercise plan that is right for you. What should I know about screenings and tests to prevent falls? Screening and testing are the best ways to find a health problem early. Early diagnosis and treatment give you the best chance of managing medical conditions that are common after age 41. Certain conditions and lifestyle choices may make you more likely to have a fall. Your health care provider may recommend: Regular vision checks. Poor vision and conditions such as cataracts can make you more likely to have a fall. If you wear glasses, make sure to get your prescription updated if your vision changes. Medicine review. Work with your health care provider to regularly review all of the medicines you are taking, including over-the-counter medicines. Ask your health care provider about any side effects that may make you more likely to have a fall. Tell your health care provider if any medicines that you take make you feel dizzy or sleepy. Strength and balance checks. Your health care provider may recommend certain tests to check your strength and balance while standing, walking, or changing positions. Foot health exam. Foot pain and numbness, as well as not wearing proper footwear, can make you more likely to have a fall. Screenings, including: Osteoporosis screening. Osteoporosis is a condition that causes the bones to get weaker and break more easily. Blood pressure screening. Blood pressure changes and medicines to control blood pressure can make you feel dizzy. Depression screening. You may be more likely to have a fall if you have a fear of falling, feel depressed, or feel unable to do activities that you used to do. Alcohol  use screening. Using too much alcohol can affect your balance and may make you more likely to have a fall. Follow these instructions at home: Lifestyle Do not drink alcohol if: Your health care provider tells you not to drink. If you drink alcohol: Limit how much you have to: 0-1 drink a day for women. 0-2 drinks a day for men. Know how much alcohol is in your drink. In the [place] one drink equals one 12 oz bottle of beer (355 mL), one 5 oz glass of wine (148 mL), or one 1 oz glass of hard liquor (44 mL). Do not use any products that contain nicotine or tobacco. These products include cigarettes, chewing tobacco, and vaping devices, such as e-cigarettes. If you need help quitting, ask your health care provider. Activity  Follow a regular exercise program to stay fit. This will help you maintain your balance. Ask your health care provider what types  of exercise are appropriate for you. If you need a cane or walker, use it as recommended by your health care provider. Wear supportive shoes that have nonskid soles. Safety  Remove any tripping hazards, such as rugs, cords, and clutter. Install safety equipment such as grab bars in bathrooms and safety rails on stairs. Keep rooms and walkways well-lit. General instructions Talk with your health care provider about your risks for falling. Tell your health care provider if: You fall. Be sure to tell your health care provider about all falls, even ones that seem minor. You feel dizzy, tiredness (fatigue), or off-balance. Take over-the-counter and prescription medicines only as told by your health care provider. These include supplements. Eat a healthy diet and maintain a healthy weight. A healthy diet includes low-fat dairy products, low-fat (lean) meats, and fiber from whole grains, beans, and lots of fruits and vegetables. Stay current with your vaccines. Schedule regular health, dental, and eye exams. Summary Having a healthy lifestyle and  getting preventive care can help to protect your health and wellness after age 47. Screening and testing are the best way to find a health problem early and help you avoid having a fall. Early diagnosis and treatment give you the best chance for managing medical conditions that are more common for people who are older than age 53. Falls are a major cause of broken bones and head injuries in people who are older than age 46. Take precautions to prevent a fall at home. Work with your health care provider to learn what changes you can make to improve your health and wellness and to prevent falls. This information is not intended to replace advice given to you by your health care provider. Make sure you discuss any questions you have with your health care provider. Document Revised: 02/21/2021 Document Reviewed: 02/21/2021 Elsevier Patient Education  2023 Elsevier Inc.        Signed,   Meredith Staggers, MD [facility], [facility] 05/12/22 10:21 AM

## 2022-05-12 NOTE — Patient Instructions (Addendum)
Glad to hear that anxiety has improved. Continue higher dose fluoxetine for now, klonopin refilled if needed. Follow up in 6 months, sooner if any worsening.   Continue to work stress management and discuss with therapist. If blood pressures remain elevated, would consider repeat low dose of beta blocker like metoprolol or other med amlodipine.  Let me know.   Call gastroenterology for colonoscopy appointment.   If last pap test was with HPV testing, then ok to repeat in 5 years. Please send me a report.  Continue vitamin D, but make sure you are getting 1200-1500mg  of calcium per day.   Schedule dental visit.   I will check some labs, but follow up if nausea continues. Try to eat regular meals and breakfast earlier each day.   Return to the clinic or go to the nearest emergency room if any of your symptoms worsen or new symptoms occur.  Health Maintenance After Age 87 After age 40, you are at a higher risk for certain long-term diseases and infections as well as injuries from falls. Falls are a major cause of broken bones and head injuries in people who are older than age 35. Getting regular preventive care can help to keep you healthy and well. Preventive care includes getting regular testing and making lifestyle changes as recommended by your health care provider. Talk with your health care provider about: Which screenings and tests you should have. A screening is a test that checks for a disease when you have no symptoms. A diet and exercise plan that is right for you. What should I know about screenings and tests to prevent falls? Screening and testing are the best ways to find a health problem early. Early diagnosis and treatment give you the best chance of managing medical conditions that are common after age 4. Certain conditions and lifestyle choices may make you more likely to have a fall. Your health care provider may recommend: Regular vision checks. Poor vision and conditions such  as cataracts can make you more likely to have a fall. If you wear glasses, make sure to get your prescription updated if your vision changes. Medicine review. Work with your health care provider to regularly review all of the medicines you are taking, including over-the-counter medicines. Ask your health care provider about any side effects that may make you more likely to have a fall. Tell your health care provider if any medicines that you take make you feel dizzy or sleepy. Strength and balance checks. Your health care provider may recommend certain tests to check your strength and balance while standing, walking, or changing positions. Foot health exam. Foot pain and numbness, as well as not wearing proper footwear, can make you more likely to have a fall. Screenings, including: Osteoporosis screening. Osteoporosis is a condition that causes the bones to get weaker and break more easily. Blood pressure screening. Blood pressure changes and medicines to control blood pressure can make you feel dizzy. Depression screening. You may be more likely to have a fall if you have a fear of falling, feel depressed, or feel unable to do activities that you used to do. Alcohol use screening. Using too much alcohol can affect your balance and may make you more likely to have a fall. Follow these instructions at home: Lifestyle Do not drink alcohol if: Your health care provider tells you not to drink. If you drink alcohol: Limit how much you have to: 0-1 drink a day for women. 0-2 drinks a day for  men. Know how much alcohol is in your drink. In the [place] one drink equals one 12 oz bottle of beer (355 mL), one 5 oz glass of wine (148 mL), or one 1 oz glass of hard liquor (44 mL). Do not use any products that contain nicotine or tobacco. These products include cigarettes, chewing tobacco, and vaping devices, such as e-cigarettes. If you need help quitting, ask your health care provider. Activity  Follow a  regular exercise program to stay fit. This will help you maintain your balance. Ask your health care provider what types of exercise are appropriate for you. If you need a cane or walker, use it as recommended by your health care provider. Wear supportive shoes that have nonskid soles. Safety  Remove any tripping hazards, such as rugs, cords, and clutter. Install safety equipment such as grab bars in bathrooms and safety rails on stairs. Keep rooms and walkways well-lit. General instructions Talk with your health care provider about your risks for falling. Tell your health care provider if: You fall. Be sure to tell your health care provider about all falls, even ones that seem minor. You feel dizzy, tiredness (fatigue), or off-balance. Take over-the-counter and prescription medicines only as told by your health care provider. These include supplements. Eat a healthy diet and maintain a healthy weight. A healthy diet includes low-fat dairy products, low-fat (lean) meats, and fiber from whole grains, beans, and lots of fruits and vegetables. Stay current with your vaccines. Schedule regular health, dental, and eye exams. Summary Having a healthy lifestyle and getting preventive care can help to protect your health and wellness after age 76. Screening and testing are the best way to find a health problem early and help you avoid having a fall. Early diagnosis and treatment give you the best chance for managing medical conditions that are more common for people who are older than age 64. Falls are a major cause of broken bones and head injuries in people who are older than age 81. Take precautions to prevent a fall at home. Work with your health care provider to learn what changes you can make to improve your health and wellness and to prevent falls. This information is not intended to replace advice given to you by your health care provider. Make sure you discuss any questions you have with your  health care provider. Document Revised: 02/21/2021 Document Reviewed: 02/21/2021 Elsevier Patient Education  2023 [employer].

## 2022-05-19 ENCOUNTER — Other Ambulatory Visit: Payer: Self-pay | Admitting: Physician Assistant

## 2022-05-19 MED ORDER — VALACYCLOVIR HCL 500 MG PO TABS: 500.0000 mg | ORAL_TABLET | Freq: Two times a day (BID) | ORAL | 3 refills | Status: AC

## 2022-05-19 NOTE — Progress Notes (Signed)
Called in rx for Valtrex

## 2022-06-20 ENCOUNTER — Other Ambulatory Visit: Payer: Self-pay

## 2022-06-20 ENCOUNTER — Encounter: Payer: Self-pay | Admitting: Family Medicine

## 2022-06-20 ENCOUNTER — Other Ambulatory Visit: Payer: Self-pay | Admitting: Family Medicine

## 2022-06-20 DIAGNOSIS — E039 Hypothyroidism, unspecified: Secondary | ICD-10-CM

## 2022-06-20 DIAGNOSIS — G458 Other transient cerebral ischemic attacks and related syndromes: Secondary | ICD-10-CM

## 2022-06-20 MED ORDER — SYNTHROID 100 MCG PO TABS: 100.0000 ug | ORAL_TABLET | Freq: Every day | ORAL | 2 refills | Status: DC

## 2022-07-03 ENCOUNTER — Encounter: Payer: Self-pay | Admitting: Family

## 2022-07-03 ENCOUNTER — Ambulatory Visit (INDEPENDENT_AMBULATORY_CARE_PROVIDER_SITE_OTHER): Payer: Medicare Other | Admitting: Family

## 2022-07-03 VITALS — BP 130/68 | HR 71 | Temp 98.1°F | Ht 65.0 in | Wt 175.6 lb

## 2022-07-03 DIAGNOSIS — J441 Chronic obstructive pulmonary disease with (acute) exacerbation: Secondary | ICD-10-CM | POA: Diagnosis not present

## 2022-07-03 MED ORDER — HYDROCODONE BIT-HOMATROP MBR 5-1.5 MG/5ML PO SOLN: 5.0000 mL | Freq: Three times a day (TID) | ORAL | 0 refills | Status: DC | PRN

## 2022-07-03 MED ORDER — DOXYCYCLINE HYCLATE 100 MG PO TABS: 100.0000 mg | ORAL_TABLET | Freq: Two times a day (BID) | ORAL | 0 refills | Status: DC

## 2022-07-03 MED ORDER — PREDNISONE 20 MG PO TABS: ORAL_TABLET | ORAL | 0 refills | Status: DC

## 2022-07-03 MED ORDER — ALBUTEROL SULFATE HFA 108 (90 BASE) MCG/ACT IN AERS: INHALATION_SPRAY | RESPIRATORY_TRACT | 2 refills | Status: DC

## 2022-07-03 NOTE — Progress Notes (Signed)
Patient ID: Bethany Clarke, female    DOB: 1956/06/10, 65 y.o.   MRN: [id number]  Chief Complaint  Patient presents with   COPD    Pt states she has COPD and has been coughing so much that her head hurts.    HPI:     COPD exacerbation:  last COPD exacerbation in 01/2022, refuses maintenance inhalers, uses Albuterol q4h. pt reports she used to take Symbicort & Spiriva but didn't think it helped her.  Reports getting exacerbations about twice a year and it requires abt, steroids & cough syrup to clear up. Reports smoking cessation for 8 years.     Assessment & Plan:  1. COPD exacerbation (HCC) resending previous exacerbation meds. Advised on use & SE. F/U with PCP if not getting better by next week.  - albuterol (VENTOLIN HFA) 108 (90 Base) MCG/ACT inhaler; INHALE 1-2 PUFFS INTO THE LUNGS EVERY 4 (FOUR) HOURS AS NEEDED FOR WHEEZING OR SHORTNESS OF BREATH.  Dispense: 18 g; Refill: 2 - doxycycline (VIBRA-TABS) 100 MG tablet; Take 1 tablet (100 mg total) by mouth 2 (two) times daily. OK to stop after 7 days if no cough.  Dispense: 20 tablet; Refill: 0 - HYDROcodone bit-homatropine (HYCODAN) 5-1.5 MG/5ML syrup; Take 5 mLs by mouth every 8 (eight) hours as needed for cough.  Dispense: 120 mL; Refill: 0 - predniSONE (DELTASONE) 20 MG tablet; 3 by mouth in the mornings for 3 days, then 2 by mouth for 2 days, then 1 by mouth for 2 days, then 1/2 by mouth for 2 days.  Dispense: 16 tablet; Refill: 0  Subjective:    Outpatient Medications Prior to Visit  Medication Sig Dispense Refill   albuterol (VENTOLIN HFA) 108 (90 Base) MCG/ACT inhaler INHALE 1-2 PUFFS INTO THE LUNGS EVERY 4 (FOUR) HOURS AS NEEDED FOR WHEEZING OR SHORTNESS OF BREATH. 18 g 2   atorvastatin (LIPITOR) 40 MG tablet Take 1 tablet (40 mg total) by mouth daily. 90 tablet 2   clonazePAM (KLONOPIN) 0.5 MG tablet Take 1 tablet (0.5 mg total) by mouth 2 (two) times daily as needed for anxiety. 20 tablet 1   clopidogrel (PLAVIX) 75 MG  tablet TAKE 1 TABLET BY MOUTH EVERY DAY 90 tablet 2   FLUoxetine (PROZAC) 40 MG capsule Take 1 capsule (40 mg total) by mouth daily. 90 capsule 3   SYNTHROID 100 MCG tablet Take 1 tablet (100 mcg total) by mouth daily before breakfast. 90 tablet 2   Facility-Administered Medications Prior to Visit  Medication Dose Route Frequency Provider Last Rate Last Admin   ipratropium (ATROVENT) nebulizer solution 0.5 mg  0.5 mg Nebulization Once [name], MD       Past Medical History:  Diagnosis Date   Atherosclerosis 11/14/2005   carotid US - mild calcific non-stenotic plaque bilaterally   Carotid bruit 02/05/2009   Doppler - R/L ICAs 0-49% diameter reductin (velocities suggest low-mid range); L subclavian 0-49%  diameter reduction   Chronic bronchitis (HCC)    "growing up; not anymore" (03/14/2013)   Colon polyp    hyperplastic   Costochondritis    Dizziness    Emphysema of lung (HCC)    GERD (gastroesophageal reflux disease)    H. pylori infection    Heart burn    Hemorrhoids    Hypothyroid    Lipidemia    Pneumonia ~ 2009   Subclavian artery stenosis, left (HCC)    subclavian steal physiology status post PTA and stenting   Syncope, near  Past Surgical History:  Procedure Laterality Date   ABDOMINAL HYSTERECTOMY  1999   BILATERAL UPPER EXTREMITY ANGIOGRAM N/A 03/14/2013   Procedure: BILATERAL UPPER EXTREMITY ANGIOGRAM;  Surgeon: Lorretta Harp, MD;  Location: [facility] CATH LAB;  Service: Cardiovascular;  Laterality: N/A;   BREAST BIOPSY Right 1990's   "benign" (03/14/2013)   CARDIAC CATHETERIZATION  03/03/2013   DILATION AND CURETTAGE OF UTERUS  1980's   KNEE ARTHROSCOPY WITH MENISCAL REPAIR Left 07/21/2021   Procedure: Arthroscopic debridement, arthroscopic debridement of bucket-handle component of lateral meniscus tear, and arthroscopically-assisted repair of lateral meniscus root tear, left knee. ;  Surgeon: Corky Mull, MD;  Location: [facility] ORS;  Service: Orthopedics;   Laterality: Left;   LEFT HEART CATHETERIZATION WITH CORONARY ANGIOGRAM Bilateral 03/03/2013   Procedure: LEFT HEART CATHETERIZATION WITH CORONARY ANGIOGRAM;  Surgeon: Troy Sine, MD;  Location: [facility] CATH LAB;  Service: Cardiovascular;  Laterality: Bilateral;   PERCUTANEOUS STENT INTERVENTION  03/14/2013   Procedure: PERCUTANEOUS STENT INTERVENTION;  Surgeon: Lorretta Harp, MD;  Location: [facility] CATH LAB;  Service: Cardiovascular;;   SUBCLAVIAN ARTERY STENT Left 03/14/2013    for subclavian steal/claudication (03/14/2013)   TUBAL LIGATION  1980's   No Known Allergies    Objective:    Physical Exam Vitals and nursing note reviewed.  Constitutional:      Appearance: Normal appearance.  Cardiovascular:     Rate and Rhythm: Normal rate and regular rhythm.  Pulmonary:     Effort: Pulmonary effort is normal.     Breath sounds: Examination of the right-upper field reveals rhonchi. Examination of the left-upper field reveals rhonchi. Examination of the right-middle field reveals rhonchi. Rhonchi (mild) present.  Musculoskeletal:        General: Normal range of motion.  Skin:    General: Skin is warm and dry.  Neurological:     Mental Status: She is alert.  Psychiatric:        Mood and Affect: Mood normal.        Behavior: Behavior normal.    BP 130/68 (BP Location: Left Arm, Patient Position: Sitting, Cuff Size: Large)   Pulse 71   Temp 98.1 F (36.7 C) (Temporal)   Ht 5\' 5"  (1.651 m)   Wt 175 lb 9.6 oz (79.7 kg)   SpO2 95%   BMI 29.22 kg/m  Wt Readings from Last 3 Encounters:  07/03/22 175 lb 9.6 oz (79.7 kg)  05/12/22 178 lb 3.2 oz (80.8 kg)  01/30/22 183 lb 6.4 oz (83.2 kg)       Jeanie Sewer, NP

## 2022-08-03 ENCOUNTER — Ambulatory Visit: Payer: Medicare Other

## 2022-08-03 ENCOUNTER — Telehealth: Payer: Self-pay

## 2022-08-03 NOTE — Telephone Encounter (Signed)
Called patient x3 with no answer, Patient may reschedule for the next available appointment.  L.Wilson,LPN  

## 2022-08-07 NOTE — Telephone Encounter (Signed)
I spoke with patient and rescheduled her AWV to 08/08/2022 at 11:15.

## 2022-08-08 ENCOUNTER — Ambulatory Visit (INDEPENDENT_AMBULATORY_CARE_PROVIDER_SITE_OTHER): Payer: Medicare Other

## 2022-08-08 VITALS — Ht 65.0 in | Wt 179.0 lb

## 2022-08-08 DIAGNOSIS — Z0000 Encounter for general adult medical examination without abnormal findings: Secondary | ICD-10-CM

## 2022-08-08 NOTE — Progress Notes (Signed)
Subjective:   Bethany Clarke is a 66 y.o. female who presents for an Initial Medicare Annual Wellness Visit.   I connected with  KIRSTEN SPEARING on 08/08/22 by a audio enabled telemedicine application and verified that I am speaking with the correct person using two identifiers.  Patient Location: Home  Provider Location: Home Office  I discussed the limitations of evaluation and management by telemedicine. The patient expressed understanding and agreed to proceed.  Review of Systems     Cardiac Risk Factors include: advanced age (>30men, >72 women)     Objective:    Today's Vitals   08/08/22 1119  Weight: 179 lb (81.2 kg)  Height: 5\' 5"  (1.651 m)   Body mass index is 29.79 kg/m.     08/08/2022   11:22 AM 09/14/2021    3:18 PM 01/04/2019   11:26 PM 04/12/2015    3:04 PM 03/14/2013    3:48 PM 03/14/2013    6:40 AM 03/03/2013    7:06 AM  Advanced Directives  Does Patient Have a Medical Advance Directive? No No No No Patient does not have advance directive;Patient would not like information Patient does not have advance directive Patient does not have advance directive  Would patient like information on creating a medical advance directive? No - Patient declined No - Patient declined No - Patient declined No - patient declined information       Current Medications (verified) Outpatient Encounter Medications as of 08/08/2022  Medication Sig   albuterol (VENTOLIN HFA) 108 (90 Base) MCG/ACT inhaler INHALE 1-2 PUFFS INTO THE LUNGS EVERY 4 (FOUR) HOURS AS NEEDED FOR WHEEZING OR SHORTNESS OF BREATH.   atorvastatin (LIPITOR) 40 MG tablet Take 1 tablet (40 mg total) by mouth daily.   clonazePAM (KLONOPIN) 0.5 MG tablet Take 1 tablet (0.5 mg total) by mouth 2 (two) times daily as needed for anxiety.   clopidogrel (PLAVIX) 75 MG tablet TAKE 1 TABLET BY MOUTH EVERY DAY   doxycycline (VIBRA-TABS) 100 MG tablet Take 1 tablet (100 mg total) by mouth 2 (two) times daily. OK to stop  after 7 days if no cough.   FLUoxetine (PROZAC) 40 MG capsule Take 1 capsule (40 mg total) by mouth daily.   HYDROcodone bit-homatropine (HYCODAN) 5-1.5 MG/5ML syrup Take 5 mLs by mouth every 8 (eight) hours as needed for cough.   predniSONE (DELTASONE) 20 MG tablet 3 by mouth in the mornings for 3 days, then 2 by mouth for 2 days, then 1 by mouth for 2 days, then 1/2 by mouth for 2 days.   SYNTHROID 100 MCG tablet Take 1 tablet (100 mcg total) by mouth daily before breakfast.   Facility-Administered Encounter Medications as of 08/08/2022  Medication   ipratropium (ATROVENT) nebulizer solution 0.5 mg    Allergies (verified) Patient has no known allergies.   History: Past Medical History:  Diagnosis Date   Atherosclerosis 11/14/2005   carotid 11/16/2005 - mild calcific non-stenotic plaque bilaterally   Carotid bruit 02/05/2009   Doppler - R/L ICAs 0-49% diameter reductin (velocities suggest low-mid range); L subclavian 0-49%  diameter reduction   Chronic bronchitis (HCC)    "growing up; not anymore" (03/14/2013)   Colon polyp    hyperplastic   Costochondritis    Dizziness    Emphysema of lung (HCC)    GERD (gastroesophageal reflux disease)    H. pylori infection    Heart burn    Hemorrhoids    Hypothyroid    Lipidemia  Pneumonia ~ 2009   Subclavian artery stenosis, left (HCC)    subclavian steal physiology status post PTA and stenting   Syncope, near    Past Surgical History:  Procedure Laterality Date   ABDOMINAL HYSTERECTOMY  1999   BILATERAL UPPER EXTREMITY ANGIOGRAM N/A 03/14/2013   Procedure: BILATERAL UPPER EXTREMITY ANGIOGRAM;  Surgeon: [name], MD;  Location: [facility] CATH LAB;  Service: Cardiovascular;  Laterality: N/A;   BREAST BIOPSY Right 1990's   "benign" (03/14/2013)   CARDIAC CATHETERIZATION  03/03/2013   DILATION AND CURETTAGE OF UTERUS  1980's   KNEE ARTHROSCOPY WITH MENISCAL REPAIR Left 07/21/2021   Procedure: Arthroscopic debridement, arthroscopic debridement  of bucket-handle component of lateral meniscus tear, and arthroscopically-assisted repair of lateral meniscus root tear, left knee. ;  Surgeon: Christena Flake, MD;  Location: [facility] ORS;  Service: Orthopedics;  Laterality: Left;   LEFT HEART CATHETERIZATION WITH CORONARY ANGIOGRAM Bilateral 03/03/2013   Procedure: LEFT HEART CATHETERIZATION WITH CORONARY ANGIOGRAM;  Surgeon: Lennette Bihari, MD;  Location: [facility] CATH LAB;  Service: Cardiovascular;  Laterality: Bilateral;   PERCUTANEOUS STENT INTERVENTION  03/14/2013   Procedure: PERCUTANEOUS STENT INTERVENTION;  Surgeon: [name], MD;  Location: [facility] C[facility] LAB;  Service: Cardiovascular;;   SUBCLAVIAN ARTERY STENT Left 03/14/2013    for subclavian steal/claudication (03/14/2013)   TUBAL LIGATION  1980's   Family History  Problem Relation Age of Onset   Cancer Brother        spine/lung   COPD Brother        smoked   Hypertension Brother    Rheum arthritis Brother    Hyperlipidemia Brother    Hypertension Brother    Diabetes Mother    Heart disease Mother    Stroke Mother    Cancer Father        cirrohosis   Hypertension Father    Heart disease Sister    Diabetes Sister    Hyperlipidemia Sister    Hypertension Sister    COPD Sister    Arthritis Sister        rheumatoid   Diabetes Sister    Arthritis Sister        rheumatoid   Colon cancer Neg Hx    Social History   Socioeconomic History   Marital status: Married    Spouse name: Renae Fickle   Number of children: 3   Years of education: Not on file   Highest education level: Some college, no degree  Occupational History   Occupation: Orthoptist: [employer]  Tobacco Use   Smoking status: Former    Packs/day: 0.50    Years: 40.00    Total pack years: 20.00    Types: Cigarettes    Quit date: 02/11/2013    Years since quitting: 9.4   Smokeless tobacco: Never  Substance and Sexual Activity   Alcohol use: No   Drug use: No   Sexual activity: Yes     Comment: number of sex partners in the last 12 months  1  Other Topics Concern   Not on file  Social History Narrative   Daily caffeine. Married. Education: [employer]. Exercise: No.      Patient is right-handed. She lives with her husband in a two level home. She drinks 2 cups of coffee a day. 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   Social Determinants of Health   Financial Resource Strain: Low Risk  (08/08/2022)   Overall Financial  Resource Strain (CARDIA)    Difficulty of Paying Living Expenses: Not hard at all  Food Insecurity: No Food Insecurity (08/08/2022)   Hunger Vital Sign    Worried About Running Out of Food in the Last Year: Never true    Ran Out of Food in the Last Year: Never true  Transportation Needs: No Transportation Needs (08/08/2022)   PRAPARE - Hydrologist (Medical): No    Lack of Transportation (Non-Medical): No  Physical Activity: Sufficiently Active (08/08/2022)   Exercise Vital Sign    Days of Exercise per Week: 7 days    Minutes of Exercise per Session: 30 min  Stress: No Stress Concern Present (08/08/2022)   [place]    Feeling of Stress : Not at all  Social Connections: Moderately Integrated (08/08/2022)   Social Connection and Isolation Panel [NHANES]    Frequency of Communication with Friends and Family: More than three times a week    Frequency of Social Gatherings with Friends and Family: More than three times a week    Attends Religious Services: Never    Marine scientist or Organizations: Yes    Attends Music therapist: More than 4 times per year    Marital Status: Married    Tobacco Counseling Counseling given: Not Answered   Clinical Intake:  Pre-visit preparation completed: Yes  Pain : No/denies pain     Nutritional Risks: None Diabetes: No  How often do you need to have someone help you when you read  instructions, pamphlets, or other written materials from your doctor or pharmacy?: 1 - Never  Diabetic?no   Interpreter Needed?: No  Information entered by :: Jadene Pierini, LPN   Activities of Daily Living    08/08/2022   11:22 AM 05/12/2022    9:16 AM  In your present state of health, do you have any difficulty performing the following activities:  Hearing? 0 0  Vision? 0 0  Difficulty concentrating or making decisions? 0 0  Walking or climbing stairs? 0 0  Dressing or bathing? 0 0  Doing errands, shopping? 0 0  Preparing Food and eating ? N   Using the Toilet? N   In the past six months, have you accidently leaked urine? N   Do you have problems with loss of bowel control? N   Managing your Medications? N   Managing your Finances? N   Housekeeping or managing your Housekeeping? N     Patient Care Team: Wendie Agreste, MD as PCP - General (Family Medicine) Pieter Partridge, DO as Consulting Physician (Neurology)  Indicate any recent Medical Services you may have received from other than [facility] providers in the past year (date may be approximate).     Assessment:   This is a routine wellness examination for Nonna.  Hearing/Vision screen Vision Screening - Comments:: Annual eye exams wears glasses   Dietary issues and exercise activities discussed: Current Exercise Habits: Home exercise routine, Type of exercise: walking, Time (Minutes): 30, Frequency (Times/Week): 7, Weekly Exercise (Minutes/Week): 210, Intensity: Mild, Exercise limited by: None identified   Goals Addressed             This Visit's Progress    DIET - INCREASE WATER INTAKE         Depression Screen    08/08/2022   11:21 AM 05/12/2022    9:13 AM 01/12/2022    1:41 PM 08/22/2021  1:31 PM 12/22/2020    3:12 PM 10/23/2019   11:11 AM 01/02/2019    4:27 PM  PHQ 2/9 Scores  PHQ - 2 Score 0 4 3 5  0 0 0  PHQ- 9 Score  13 7 15        Fall Risk    08/08/2022   11:20 AM 05/12/2022    9:14 AM  01/19/2022   11:01 AM 01/12/2022    1:41 PM 08/22/2021    1:31 PM  Fall Risk   Falls in the past year? 0 0 0 0 1  Number falls in past yr: 0 0 0 0 0  Injury with Fall? 0 0 0 0 1  Risk for fall due to : No Fall Risks No Fall Risks No Fall Risks No Fall Risks No Fall Risks  Follow up Falls prevention discussed Falls evaluation completed Falls evaluation completed  Falls evaluation completed    FALL RISK PREVENTION PERTAINING TO THE HOME:  Any stairs in or around the home? Yes  If so, are there any without handrails? No  Home free of loose throw rugs in walkways, pet beds, electrical cords, etc? Yes  Adequate lighting in your home to reduce risk of falls? Yes   ASSISTIVE DEVICES UTILIZED TO PREVENT FALLS:  Life alert? No  Use of a cane, walker or w/c? No  Grab bars in the bathroom? No  Shower chair or bench in shower? Yes  Elevated toilet seat or a handicapped toilet? No        08/08/2022   11:23 AM 05/12/2022   10:19 AM 05/12/2022    9:14 AM  6CIT Screen  What Year? 0 points 0 points 4 points  What month? 0 points 0 points 3 points  What time? 0 points 0 points 3 points  Count back from 20 0 points 0 points 4 points  Months in reverse 0 points 0 points 4 points  Repeat phrase 0 points 0 points 10 points  Total Score 0 points 0 points 28 points    Immunizations Immunization History  Administered Date(s) Administered   Influenza, Seasonal, Injecte, Preservative Fre 08/18/2017   Influenza,inj,Quad PF,6+ Mos 12/01/2015, 08/26/2017, 07/28/2018, 06/08/2019, 06/20/2020   PFIZER(Purple Top)SARS-COV-2 Vaccination 12/29/2019, 01/19/2020, 07/24/2020   PNEUMOCOCCAL CONJUGATE-20 05/12/2022   Pneumococcal Polysaccharide-23 10/17/2003   Rotavirus Monovalent 10/16/2000   Td 10/16/2000   Tdap 03/15/2007, 04/11/2017    TDAP status: Up to date  Flu Vaccine status: Up to date  Pneumococcal vaccine status: Up to date  Covid-19 vaccine status: Completed vaccines  Qualifies for  Shingles Vaccine? Yes   Zostavax completed Yes   Shingrix Completed?: Yes  Screening Tests Health Maintenance  Topic Date Due   Lung Cancer Screening  12/23/2016   COVID-19 Vaccine (4 - Pfizer series) 09/18/2020   INFLUENZA VACCINE  05/16/2022   Zoster Vaccines- Shingrix (1 of 2) 08/12/2022 (Originally 07/18/2006)   Hepatitis C Screening  01/13/2023 (Originally 07/18/1974)   COLONOSCOPY (Pts 45-55yrs Insurance coverage will need to be confirmed)  05/13/2023 (Originally 02/24/2019)   MAMMOGRAM  01/21/2023   Medicare Annual Wellness (AWV)  09/08/2023   TETANUS/TDAP  04/12/2027   Pneumonia Vaccine 37+ Years old  Completed   DEXA SCAN  Completed   HPV VACCINES  Aged Out    Health Maintenance  Health Maintenance Due  Topic Date Due   Lung Cancer Screening  12/23/2016   COVID-19 Vaccine (4 - Pfizer series) 09/18/2020   INFLUENZA VACCINE  05/16/2022    Colorectal cancer  screening: Referral to GI placed declined at this time . Pt aware the office will call re: appt.  Mammogram status: Completed 01/30/2022. Repeat every year  Bone Density status: Completed 02/02/2022. Results reflect: Bone density results: OSTEOPENIA. Repeat every 5 years.  Lung Cancer Screening: (Low Dose CT Chest recommended if Age 56-80 years, 30 pack-year currently smoking OR have quit w/in 15years.) does not qualify.   Lung Cancer Screening Referral: n/a  Additional Screening:  Hepatitis C Screening: does not qualify;   Vision Screening: Recommended annual ophthalmology exams for early detection of glaucoma and other disorders of the eye. Is the patient up to date with their annual eye exam?  Yes  Who is the provider or what is the name of the office in which the patient attends annual eye exams? [facility]  If pt is not established with a provider, would they like to be referred to a provider to establish care? No .   Dental Screening: Recommended annual dental exams for proper oral  hygiene  Community Resource Referral / Chronic Care Management: CRR required this visit?  No   CCM required this visit?  No      Plan:     I have personally reviewed and noted the following in the patient's chart:   Medical and social history Use of alcohol, tobacco or illicit drugs  Current medications and supplements including opioid prescriptions. Patient is not currently taking opioid prescriptions. Functional ability and status Nutritional status Physical activity Advanced directives List of other physicians Hospitalizations, surgeries, and ER visits in previous 12 months Vitals Screenings to include cognitive, depression, and falls Referrals and appointments  In addition, I have reviewed and discussed with patient certain preventive protocols, quality metrics, and best practice recommendations. A written personalized care plan for preventive services as well as general preventive health recommendations were provided to patient.     Lorrene ReidLaura L Wilson, LPN   [id number]/960410/24/2023   Nurse Notes: none

## 2022-08-08 NOTE — Patient Instructions (Signed)
Bethany Clarke , Thank you for taking time to come for your Medicare Wellness Visit. I appreciate your ongoing commitment to your health goals. Please review the following plan we discussed and let me know if I can assist you in the future.   These are the goals we discussed:  Goals      DIET - INCREASE WATER INTAKE        This is a list of the screening recommended for you and due dates:  Health Maintenance  Topic Date Due   Screening for Lung Cancer  12/23/2016   COVID-19 Vaccine (4 - Pfizer series) 09/18/2020   Flu Shot  05/16/2022   Zoster (Shingles) Vaccine (1 of 2) 08/12/2022*   Hepatitis C Screening: USPSTF Recommendation to screen - Ages 18-79 yo.  01/13/2023*   Colon Cancer Screening  05/13/2023*   Mammogram  01/21/2023   Medicare Annual Wellness Visit  09/08/2023   Tetanus Vaccine  04/12/2027   Pneumonia Vaccine  Completed   DEXA scan (bone density measurement)  Completed   HPV Vaccine  Aged Out  *Topic was postponed. The date shown is not the original due date.    Advanced directives: Advance directive discussed with you today. I have provided a copy for you to complete at home and have notarized. Once this is complete please bring a copy in to our office so we can scan it into your chart.   Conditions/risks identified: Aim for 30 minutes of exercise or brisk walking, 6-8 glasses of water, and 5 servings of fruits and vegetables each day.   Next appointment: Follow up in one year for your annual wellness visit    Preventive Care 65 Years and Older, Female Preventive care refers to lifestyle choices and visits with your health care provider that can promote health and wellness. What does preventive care include? A yearly physical exam. This is also called an annual well check. Dental exams once or twice a year. Routine eye exams. Ask your health care provider how often you should have your eyes checked. Personal lifestyle choices, including: Daily care of your teeth  and gums. Regular physical activity. Eating a healthy diet. Avoiding tobacco and drug use. Limiting alcohol use. Practicing safe sex. Taking low-dose aspirin every day. Taking vitamin and mineral supplements as recommended by your health care provider. What happens during an annual well check? The services and screenings done by your health care provider during your annual well check will depend on your age, overall health, lifestyle risk factors, and family history of disease. Counseling  Your health care provider may ask you questions about your: Alcohol use. Tobacco use. Drug use. Emotional well-being. Home and relationship well-being. Sexual activity. Eating habits. History of falls. Memory and ability to understand (cognition). Work and work Statistician. Reproductive health. Screening  You may have the following tests or measurements: Height, weight, and BMI. Blood pressure. Lipid and cholesterol levels. These may be checked every 5 years, or more frequently if you are over 5 years old. Skin check. Lung cancer screening. You may have this screening every year starting at age 81 if you have a 30-pack-year history of smoking and currently smoke or have quit within the past 15 years. Fecal occult blood test (FOBT) of the stool. You may have this test every year starting at age 60. Flexible sigmoidoscopy or colonoscopy. You may have a sigmoidoscopy every 5 years or a colonoscopy every 10 years starting at age 48. Hepatitis C blood test. Hepatitis B blood test.  Sexually transmitted disease (STD) testing. Diabetes screening. This is done by checking your blood sugar (glucose) after you have not eaten for a while (fasting). You may have this done every 1-3 years. Bone density scan. This is done to screen for osteoporosis. You may have this done starting at age 49. Mammogram. This may be done every 1-2 years. Talk to your health care provider about how often you should have regular  mammograms. Talk with your health care provider about your test results, treatment options, and if necessary, the need for more tests. Vaccines  Your health care provider may recommend certain vaccines, such as: Influenza vaccine. This is recommended every year. Tetanus, diphtheria, and acellular pertussis (Tdap, Td) vaccine. You may need a Td booster every 10 years. Zoster vaccine. You may need this after age 59. Pneumococcal 13-valent conjugate (PCV13) vaccine. One dose is recommended after age 50. Pneumococcal polysaccharide (PPSV23) vaccine. One dose is recommended after age 75. Talk to your health care provider about which screenings and vaccines you need and how often you need them. This information is not intended to replace advice given to you by your health care provider. Make sure you discuss any questions you have with your health care provider. Document Released: 10/29/2015 Document Revised: 06/21/2016 Document Reviewed: 08/03/2015 Elsevier Interactive Patient Education  2017 [place] Prevention in the Home Falls can cause injuries. They can happen to people of all ages. There are many things you can do to make your home safe and to help prevent falls. What can I do on the outside of my home? Regularly fix the edges of walkways and driveways and fix any cracks. Remove anything that might make you trip as you walk through a door, such as a raised step or threshold. Trim any bushes or trees on the path to your home. Use bright outdoor lighting. Clear any walking paths of anything that might make someone trip, such as rocks or tools. Regularly check to see if handrails are loose or broken. Make sure that both sides of any steps have handrails. Any raised decks and porches should have guardrails on the edges. Have any leaves, snow, or ice cleared regularly. Use sand or salt on walking paths during winter. Clean up any spills in your garage right away. This includes oil  or grease spills. What can I do in the bathroom? Use night lights. Install grab bars by the toilet and in the tub and shower. Do not use towel bars as grab bars. Use non-skid mats or decals in the tub or shower. If you need to sit down in the shower, use a plastic, non-slip stool. Keep the floor dry. Clean up any water that spills on the floor as soon as it happens. Remove soap buildup in the tub or shower regularly. Attach bath mats securely with double-sided non-slip rug tape. Do not have throw rugs and other things on the floor that can make you trip. What can I do in the bedroom? Use night lights. Make sure that you have a light by your bed that is easy to reach. Do not use any sheets or blankets that are too big for your bed. They should not hang down onto the floor. Have a firm chair that has side arms. You can use this for support while you get dressed. Do not have throw rugs and other things on the floor that can make you trip. What can I do in the kitchen? Clean up any spills right away.  Avoid walking on wet floors. Keep items that you use a lot in easy-to-reach places. If you need to reach something above you, use a strong step stool that has a grab bar. Keep electrical cords out of the way. Do not use floor polish or wax that makes floors slippery. If you must use wax, use non-skid floor wax. Do not have throw rugs and other things on the floor that can make you trip. What can I do with my stairs? Do not leave any items on the stairs. Make sure that there are handrails on both sides of the stairs and use them. Fix handrails that are broken or loose. Make sure that handrails are as long as the stairways. Check any carpeting to make sure that it is firmly attached to the stairs. Fix any carpet that is loose or worn. Avoid having throw rugs at the top or bottom of the stairs. If you do have throw rugs, attach them to the floor with carpet tape. Make sure that you have a light  switch at the top of the stairs and the bottom of the stairs. If you do not have them, ask someone to add them for you. What else can I do to help prevent falls? Wear shoes that: Do not have high heels. Have rubber bottoms. Are comfortable and fit you well. Are closed at the toe. Do not wear sandals. If you use a stepladder: Make sure that it is fully opened. Do not climb a closed stepladder. Make sure that both sides of the stepladder are locked into place. Ask someone to hold it for you, if possible. Clearly mark and make sure that you can see: Any grab bars or handrails. First and last steps. Where the edge of each step is. Use tools that help you move around (mobility aids) if they are needed. These include: Canes. Walkers. Scooters. Crutches. Turn on the lights when you go into a dark area. Replace any light bulbs as soon as they burn out. Set up your furniture so you have a clear path. Avoid moving your furniture around. If any of your floors are uneven, fix them. If there are any pets around you, be aware of where they are. Review your medicines with your doctor. Some medicines can make you feel dizzy. This can increase your chance of falling. Ask your doctor what other things that you can do to help prevent falls. This information is not intended to replace advice given to you by your health care provider. Make sure you discuss any questions you have with your health care provider. Document Released: 07/29/2009 Document Revised: 03/09/2016 Document Reviewed: 11/06/2014 Elsevier Interactive Patient Education  2017 [employer].

## 2022-09-19 ENCOUNTER — Ambulatory Visit (INDEPENDENT_AMBULATORY_CARE_PROVIDER_SITE_OTHER): Payer: Medicare Other | Admitting: Physician Assistant

## 2022-09-19 ENCOUNTER — Encounter: Payer: Self-pay | Admitting: Physician Assistant

## 2022-09-19 VITALS — BP 130/80 | HR 70 | Temp 97.7°F | Ht 65.0 in | Wt 175.4 lb

## 2022-09-19 DIAGNOSIS — H9201 Otalgia, right ear: Secondary | ICD-10-CM | POA: Diagnosis not present

## 2022-09-19 MED ORDER — VALACYCLOVIR HCL 1 G PO TABS: 1000.0000 mg | ORAL_TABLET | Freq: Three times a day (TID) | ORAL | 0 refills | Status: AC

## 2022-09-19 MED ORDER — PREDNISONE 20 MG PO TABS: 60.0000 mg | ORAL_TABLET | Freq: Every day | ORAL | 0 refills | Status: AC

## 2022-09-19 NOTE — Patient Instructions (Signed)
It was great to see you!  I'm concerned you may have shingles  Please start the valtrex for this  Tylenol is ok  Keep [place] posted if any new/worsening symptoms  Take care,  Jarold Motto PA-C

## 2022-09-19 NOTE — Progress Notes (Signed)
Bethany Clarke is a 66 y.o. female here for a new problem.  History of Present Illness:   Chief Complaint  Patient presents with   Otalgia    Pt c/o right ear pain started on Sunday was off and on, now having sharp pains, and tender down right side if neck. She has been using Tylenol.   Cyst    Pt c/o of knot on back of her head right side, noticed on Sunday, area is tender.    HPI  R ear pain and scalp pain Patient is complaining of intermittent right ear pain starting 2 days ago. She states that yesterday the pain lasted all day. It feels like a sharp, shooting pain that radiates to her neck at times. She manages her symptoms with tylenol. She denies cough, being around sick people, fever, chills, and  rhinorrhea.   Patient is complaining of a tender knot on the back of her head that she noticed 2 days ago. She does not know if she might have slept wrong. She denies any tingling/numbness sensation.     Past Medical History:  Diagnosis Date   Atherosclerosis 11/14/2005   carotid US - mild calcific non-stenotic plaque bilaterally   Carotid bruit 02/05/2009   Doppler - R/L ICAs 0-49% diameter reductin (velocities suggest low-mid range); L subclavian 0-49%  diameter reduction   Chronic bronchitis (HCC)    "growing up; not anymore" (03/14/2013)   Colon polyp    hyperplastic   Costochondritis    Dizziness    Emphysema of lung (HCC)    GERD (gastroesophageal reflux disease)    H. pylori infection    Heart burn    Hemorrhoids    Hypothyroid    Lipidemia    Pneumonia ~ 2009   Subclavian artery stenosis, left (HCC)    subclavian steal physiology status post PTA and stenting   Syncope, near      Social History   Tobacco Use   Smoking status: Former    Packs/day: 0.50    Years: 40.00    Total pack years: 20.00    Types: Cigarettes    Quit date: 02/11/2013    Years since quitting: 9.6   Smokeless tobacco: Never  Substance Use Topics   Alcohol use: No   Drug use: No     Past Surgical History:  Procedure Laterality Date   ABDOMINAL HYSTERECTOMY  1999   BILATERAL UPPER EXTREMITY ANGIOGRAM N/A 03/14/2013   Procedure: BILATERAL UPPER EXTREMITY ANGIOGRAM;  Surgeon: [name], MD;  Location: [facility] CATH LAB;  Service: Cardiovascular;  Laterality: N/A;   BREAST BIOPSY Right 1990's   "benign" (03/14/2013)   CARDIAC CATHETERIZATION  03/03/2013   DILATION AND CURETTAGE OF UTERUS  1980's   KNEE ARTHROSCOPY WITH MENISCAL REPAIR Left 07/21/2021   Procedure: Arthroscopic debridement, arthroscopic debridement of bucket-handle component of lateral meniscus tear, and arthroscopically-assisted repair of lateral meniscus root tear, left knee. ;  Surgeon: Christena Flake, MD;  Location: [facility] ORS;  Service: Orthopedics;  Laterality: Left;   LEFT HEART CATHETERIZATION WITH CORONARY ANGIOGRAM Bilateral 03/03/2013   Procedure: LEFT HEART CATHETERIZATION WITH CORONARY ANGIOGRAM;  Surgeon: Lennette Bihari, MD;  Location: [facility] CATH LAB;  Service: Cardiovascular;  Laterality: Bilateral;   PERCUTANEOUS STENT INTERVENTION  03/14/2013   Procedure: PERCUTANEOUS STENT INTERVENTION;  Surgeon: [name], MD;  Location: [facility] CATH LAB;  Service: Cardiovascular;;   SUBCLAVIAN ARTERY STENT Left 03/14/2013    for subclavian steal/claudication (03/14/2013)   TUBAL LIGATION  1980's    Family History  Problem Relation Age of Onset   Cancer Brother        spine/lung   COPD Brother        smoked   Hypertension Brother    Rheum arthritis Brother    Hyperlipidemia Brother    Hypertension Brother    Diabetes Mother    Heart disease Mother    Stroke Mother    Cancer Father        cirrohosis   Hypertension Father    Heart disease Sister    Diabetes Sister    Hyperlipidemia Sister    Hypertension Sister    COPD Sister    Arthritis Sister        rheumatoid   Diabetes Sister    Arthritis Sister        rheumatoid   Colon cancer Neg Hx     No Known Allergies  Current Medications:    Current Outpatient Medications:    albuterol (VENTOLIN HFA) 108 (90 Base) MCG/ACT inhaler, INHALE 1-2 PUFFS INTO THE LUNGS EVERY 4 (FOUR) HOURS AS NEEDED FOR WHEEZING OR SHORTNESS OF BREATH., Disp: 18 g, Rfl: 2   atorvastatin (LIPITOR) 40 MG tablet, Take 1 tablet (40 mg total) by mouth daily., Disp: 90 tablet, Rfl: 2   clonazePAM (KLONOPIN) 0.5 MG tablet, Take 1 tablet (0.5 mg total) by mouth 2 (two) times daily as needed for anxiety., Disp: 20 tablet, Rfl: 1   clopidogrel (PLAVIX) 75 MG tablet, TAKE 1 TABLET BY MOUTH EVERY DAY, Disp: 90 tablet, Rfl: 2   FLUoxetine (PROZAC) 40 MG capsule, Take 1 capsule (40 mg total) by mouth daily., Disp: 90 capsule, Rfl: 3   SYNTHROID 100 MCG tablet, Take 1 tablet (100 mcg total) by mouth daily before breakfast., Disp: 90 tablet, Rfl: 2  Current Facility-Administered Medications:    ipratropium (ATROVENT) nebulizer solution 0.5 mg, 0.5 mg, Nebulization, Once, [name], MD   Review of Systems:   Review of Systems  Constitutional:  Negative for chills and fever.  HENT:         (+) otalgia  Respiratory:  Negative for cough.   Skin:        (+) cyst on back of head    Vitals:   Vitals:   09/19/22 1022  BP: 130/80  Pulse: 70  Temp: 97.7 F (36.5 C)  TempSrc: Temporal  SpO2: 94%  Weight: 175 lb 6.1 oz (79.6 kg)  Height: 5\' 5"  (1.651 m)     Body mass index is 29.18 kg/m.  Physical Exam:   Physical Exam Vitals and nursing note reviewed.  Constitutional:      General: She is not in acute distress.    Appearance: Normal appearance. She is well-developed. She is not ill-appearing or toxic-appearing.  HENT:     Head: Normocephalic and atraumatic.     Comments: Small erythematous lesion to R auricle Palpable tenderness to R lateral aspect of head -- no obvious lesions    Right Ear: Tympanic membrane, ear canal and external ear normal. Tympanic membrane is not erythematous, retracted or bulging.     Left Ear: Tympanic membrane, ear  canal and external ear normal. Tympanic membrane is not erythematous, retracted or bulging.     Nose: Nose normal.     Right Sinus: No maxillary sinus tenderness or frontal sinus tenderness.     Left Sinus: No maxillary sinus tenderness or frontal sinus tenderness.     Mouth/Throat:  Pharynx: Uvula midline. No posterior oropharyngeal erythema.  Eyes:     General: Lids are normal.     Extraocular Movements: Extraocular movements intact.     Conjunctiva/sclera: Conjunctivae normal.     Pupils: Pupils are equal, round, and reactive to light.  Neck:     Trachea: Trachea normal.  Cardiovascular:     Rate and Rhythm: Normal rate and regular rhythm.     Heart sounds: Normal heart sounds, S1 normal and S2 normal. No murmur heard.    No gallop.  Pulmonary:     Effort: Pulmonary effort is normal. No respiratory distress.     Breath sounds: Normal breath sounds. No decreased breath sounds, wheezing, rhonchi or rales.  Lymphadenopathy:     Cervical: No cervical adenopathy.  Skin:    General: Skin is warm and dry.  Neurological:     Mental Status: She is alert and oriented to person, place, and time.  Psychiatric:        Speech: Speech normal.        Behavior: Behavior normal. Behavior is cooperative.        Judgment: Judgment normal.     Assessment and Plan:   Right ear pain No red flags on exam Suspect possible Herpes Zoster with concerns for Ramsay Hunt Syndrome Start oral valtrex 1g TID x 7 days and 60 mg prednisone x 5 days Follow-up if new/worsening symptoms  I,Verona Buck,acting as a scribe for [employer], PA.,have documented all relevant documentation on the behalf of Jarold Motto, PA,as directed by  Jarold Motto, PA while in the presence of Jarold Motto, [place].  I, Jarold Motto, [place], have reviewed all documentation for this visit. The documentation on 09/19/22 for the exam, diagnosis, procedures, and orders are all accurate and complete.  Jarold Motto  PA-C  [place], [place]

## 2022-09-25 ENCOUNTER — Other Ambulatory Visit: Payer: Self-pay | Admitting: Physician Assistant

## 2022-09-25 ENCOUNTER — Encounter: Payer: Self-pay | Admitting: Physician Assistant

## 2022-09-25 MED ORDER — GABAPENTIN 300 MG PO CAPS: 300.0000 mg | ORAL_CAPSULE | Freq: Every day | ORAL | 0 refills | Status: DC

## 2022-09-27 ENCOUNTER — Ambulatory Visit (INDEPENDENT_AMBULATORY_CARE_PROVIDER_SITE_OTHER): Payer: Medicare Other | Admitting: Family Medicine

## 2022-09-27 ENCOUNTER — Encounter: Payer: Self-pay | Admitting: Family Medicine

## 2022-09-27 VITALS — BP 132/70 | HR 72 | Temp 98.3°F | Ht 65.0 in | Wt 176.8 lb

## 2022-09-27 DIAGNOSIS — M542 Cervicalgia: Secondary | ICD-10-CM

## 2022-09-27 DIAGNOSIS — I6523 Occlusion and stenosis of bilateral carotid arteries: Secondary | ICD-10-CM | POA: Diagnosis not present

## 2022-09-27 DIAGNOSIS — H9201 Otalgia, right ear: Secondary | ICD-10-CM

## 2022-09-27 DIAGNOSIS — R519 Headache, unspecified: Secondary | ICD-10-CM

## 2022-09-27 MED ORDER — HYDROCODONE-ACETAMINOPHEN 5-325 MG PO TABS: 1.0000 | ORAL_TABLET | Freq: Four times a day (QID) | ORAL | 0 refills | Status: DC | PRN

## 2022-09-27 NOTE — Patient Instructions (Addendum)
Without any rash it would be less likely that you had shingles.  I am suspicious for a condition called occipital neuralgia or pain coming from the nerves in the back of the scalp.  I will refer you to neurology, but in the meantime try increasing the gabapentin to twice per day for the next few days, then 3 times per day if still having breakthrough pain.  Watch for new side effects on that medication. Let me know when you are running low and I can refill depending on how often you are taking it   Hydrocodone only if needed for breakthrough pain. That should not be combined with klonopin.   Return to the clinic or go to the nearest emergency room if any of your symptoms worsen or new symptoms occur.  Hang in there.

## 2022-09-27 NOTE — Progress Notes (Signed)
Subjective:  Patient ID: Bethany Clarke, female    DOB: [birth year]  Age: 66 y.o. MRN: [id number]  CC:  Chief Complaint  Patient presents with   Rash    Pt has a rash on right side of he head and it hurts along with her ear, was seen at [facility] and they said it was shingles    Depression    PHQ9 - 6    HPI Bethany Clarke presents for   R head pain: Seen 8 days ago by Jarold Motto, PA at [facility].  Note reviewed.  Initially right ear pain starting 2 days prior, shooting pain down neck.  Not on the back of her head.  On exam noted a small erythematous lesion to the right auricle with palpable tenderness to the right lateral aspect of the head without obvious lesions.  Started on Valtrex 1 g 3 times daily for 7 days with prednisone 60 mg for 5 days for possible herpes zoster and concern for Ramsay Hunt syndrome.  Noted MyChart message from 2 days ago, persistent pain, started on gabapentin 300 mg nightly.  Has taken gabapentin past 2 nights. Some relief with gabapentin. Pain in R scalp, R ear, R neck - persisted, and now hurts at top of scalp. Initially shooting pains, now persistent. Shooting pains to ear.  Hurts to touch scalp. Has not had rash or skin lesions. Finished prednisone and valtrex.  Hurts on neck to swallow, but no dysphagia.  Stabbing pains to ear.  No change in hearing, no ear d/c.  Pulling feeling in neck if looking left.  No fevers. No arm radiation or weakness.  No fall or preceding injury.  Tx: tylenol, heating pad, ice - alternating - not helping.     History Patient Active Problem List   Diagnosis Date Noted   Chest pain, atypical 03/17/2016   Paresthesia 07/14/2013   Bilateral foot pain 05/01/2013   Subclavian artery stenosis, left (HCC)    Subclavian steal syndrome 03/16/2013   Subclavian arterial stenosis (HCC) 03/11/2013   Carotid stenosis 03/11/2013   Hyperlipidemia with target LDL less than 70 03/11/2013   GERD  (gastroesophageal reflux disease)    Hypothyroid    Dizziness    Syncope, near    Heart burn    Hypothyroidism 05/28/2007   COPD GOLD II with reversibility  05/28/2007   Past Medical History:  Diagnosis Date   Atherosclerosis 11/14/2005   carotid US - mild calcific non-stenotic plaque bilaterally   Carotid bruit 02/05/2009   Doppler - R/L ICAs 0-49% diameter reductin (velocities suggest low-mid range); L subclavian 0-49%  diameter reduction   Chronic bronchitis (HCC)    "growing up; not anymore" (03/14/2013)   Colon polyp    hyperplastic   Costochondritis    Dizziness    Emphysema of lung (HCC)    GERD (gastroesophageal reflux disease)    H. pylori infection    Heart burn    Hemorrhoids    Hypothyroid    Lipidemia    Pneumonia ~ 2009   Subclavian artery stenosis, left (HCC)    subclavian steal physiology status post PTA and stenting   Syncope, near    Past Surgical History:  Procedure Laterality Date   ABDOMINAL HYSTERECTOMY  1999   BILATERAL UPPER EXTREMITY ANGIOGRAM N/A 03/14/2013   Procedure: BILATERAL UPPER EXTREMITY ANGIOGRAM;  Surgeon: [name], MD;  Location: [facility] CATH LAB;  Service: Cardiovascular;  Laterality: N/A;   BREAST BIOPSY Right  1990's   "benign" (03/14/2013)   CARDIAC CATHETERIZATION  03/03/2013   DILATION AND CURETTAGE OF UTERUS  1980's   KNEE ARTHROSCOPY WITH MENISCAL REPAIR Left 07/21/2021   Procedure: Arthroscopic debridement, arthroscopic debridement of bucket-handle component of lateral meniscus tear, and arthroscopically-assisted repair of lateral meniscus root tear, left knee. ;  Surgeon: Christena FlakePoggi, John J, MD;  Location: [facility] ORS;  Service: Orthopedics;  Laterality: Left;   LEFT HEART CATHETERIZATION WITH CORONARY ANGIOGRAM Bilateral 03/03/2013   Procedure: LEFT HEART CATHETERIZATION WITH CORONARY ANGIOGRAM;  Surgeon: Lennette Biharihomas A Kelly, MD;  [facility];  Service: Cardiovascular;  Laterality: Bilateral;   PERCUTANEOUS STENT INTERVENTION   03/14/2013   Procedure: PERCUTANEOUS STENT INTERVENTION;  Surgeon: Runell GessJonathan J Berry, MD;  [facility];  Service: Cardiovascular;;   SUBCLAVIAN ARTERY STENT Left 03/14/2013    for subclavian steal/claudication (03/14/2013)   TUBAL LIGATION  1980's   No Known Allergies Prior to Admission medications   Medication Sig Start Date End Date Taking? Authorizing Provider  albuterol (VENTOLIN HFA) 108 (90 Base) MCG/ACT inhaler INHALE 1-2 PUFFS INTO THE LUNGS EVERY 4 (FOUR) HOURS AS NEEDED FOR WHEEZING OR SHORTNESS OF BREATH. 07/03/22  Yes Hudnell, Judeth CornfieldStephanie, NP  atorvastatin (LIPITOR) 40 MG tablet Take 1 tablet (40 mg total) by mouth daily. 01/30/22  Yes Shade FloodGreene, Lakeeta Dobosz R, MD  clonazePAM (KLONOPIN) 0.5 MG tablet Take 1 tablet (0.5 mg total) by mouth 2 (two) times daily as needed for anxiety. 05/12/22  Yes Shade FloodGreene, Amadea Keagy R, MD  clopidogrel (PLAVIX) 75 MG tablet TAKE 1 TABLET BY MOUTH EVERY DAY 06/20/22  Yes Shade FloodGreene, [place] Inthavong R, MD  FLUoxetine (PROZAC) 40 MG capsule Take 1 capsule (40 mg total) by mouth daily. 01/30/22  Yes Shade FloodGreene, Amadou Katzenstein R, MD  gabapentin (NEURONTIN) 300 MG capsule Take 1 capsule (300 mg total) by mouth at bedtime. 09/25/22  Yes Jarold MottoWorley, Samantha, PA  SYNTHROID 100 MCG tablet Take 1 tablet (100 mcg total) by mouth daily before breakfast. 06/20/22  Yes Sheliah Hatchabori, Katherine E, MD  valACYclovir (VALTREX) 1000 MG tablet Take 1,000 mg by mouth in the morning, at noon, and at bedtime.   Yes [provider]   Social History   Socioeconomic History   Marital status: Married    Spouse name: Renae Fickleaul   Number of children: 3   Years of education: Not on file   Highest education level: Some college, no degree  Occupational History   Occupation: Orthoptistacility Mananger    Employer: [employer]  Tobacco Use   Smoking status: Former    Packs/day: 0.50    Years: 40.00    Total pack years: 20.00    Types: Cigarettes    Quit date: 02/11/2013    Years since quitting: 9.6   Smokeless tobacco:  Never  Substance and Sexual Activity   Alcohol use: No   Drug use: No   Sexual activity: Yes    Comment: number of sex partners in the last 12 months  1  Other Topics Concern   Not on file  Social History Narrative   Daily caffeine. Married. Education: [employer]. Exercise: No.      Patient is right-handed. She lives with her husband in a two level home. She drinks 2 cups of coffee a day. 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   Social Determinants of Health   Financial Resource Strain: Low Risk  (08/08/2022)   Overall Financial Resource Strain (CARDIA)    Difficulty of Paying Living Expenses: Not  hard at all  Food Insecurity: No Food Insecurity (08/08/2022)   Hunger Vital Sign    Worried About Running Out of Food in the Last Year: Never true    Ran Out of Food in the Last Year: Never true  Transportation Needs: No Transportation Needs (08/08/2022)   PRAPARE - Administrator, Civil Service (Medical): No    Lack of Transportation (Non-Medical): No  Physical Activity: Sufficiently Active (08/08/2022)   Exercise Vital Sign    Days of Exercise per Week: 7 days    Minutes of Exercise per Session: 30 min  Stress: No Stress Concern Present (08/08/2022)   [employer] of Occupational Health - Occupational Stress Questionnaire    Feeling of Stress : Not at all  Social Connections: Moderately Integrated (08/08/2022)   Social Connection and Isolation Panel [NHANES]    Frequency of Communication with Friends and Family: More than three times a week    Frequency of Social Gatherings with Friends and Family: More than three times a week    Attends Religious Services: Never    Database administrator or Organizations: Yes    Attends Engineer, structural: More than 4 times per year    Marital Status: Married  Catering manager Violence: Not At Risk (08/08/2022)   Humiliation, Afraid, Rape, and Kick questionnaire    Fear of Current or Ex-Partner: No     Emotionally Abused: No    Physically Abused: No    Sexually Abused: No    Review of Systems  Per HPI.  Objective:   Vitals:   09/27/22 1338  BP: 132/70  Pulse: 72  Temp: 98.3 F (36.8 C)  SpO2: 93%  Weight: 176 lb 12.8 oz (80.2 kg)  Height: 5\' 5"  (1.651 m)     Physical Exam Vitals reviewed.  Constitutional:      General: She is not in acute distress.    Appearance: Normal appearance. She is well-developed.  HENT:     Head: Normocephalic and atraumatic.     Comments: Diffusely tender around right posterior scalp, right parietal/lateral scalp, toward ear and right side of neck.  No rash, skin intact.  External ear without rash, erythema or swelling.  Ear canal patent, no erythema, edema, or lesions.  TM pearly gray without lesions seen.  Some limitation with left rotation due to discomfort in right neck.  No midline bony tenderness of cervical spine. Cardiovascular:     Rate and Rhythm: Normal rate.  Pulmonary:     Effort: Pulmonary effort is normal.  Skin:    General: Skin is warm and dry.     Findings: No erythema or rash.  Neurological:     General: No focal deficit present.     Mental Status: She is alert and oriented to person, place, and time.     Motor: No weakness.     Comments: Equal facial movements.  Psychiatric:        Mood and Affect: Mood normal.        Assessment & Plan:  Bethany Clarke is a 67 y.o. female . Occipital headache - Plan: Ambulatory referral to Neurology, HYDROcodone-acetaminophen (NORCO/VICODIN) 5-325 MG tablet  Right ear pain - Plan: Ambulatory referral to Neurology, HYDROcodone-acetaminophen (NORCO/VICODIN) 5-325 MG tablet  Neck pain on right side - Plan: Ambulatory referral to Neurology, HYDROcodone-acetaminophen (NORCO/VICODIN) 5-325 MG tablet  Right-sided head pain as above, without rash or significant changes with prednisone.  Less likely herpes zoster in hindsight.  Suspicious  for occipital neuralgia.  Reassuring exam  including right ear canal, TM.  No hearing changes.  -Refer to neurology  -Increase gabapentin to twice daily for now with additional increase to 3 times daily after 3 to 4 days if needed.  Potential side effects discussed.  Short-term hydrocodone if needed, do not combine with benzodiazepine.  -RTC/ER precautions given.  Meds ordered this encounter  Medications   HYDROcodone-acetaminophen (NORCO/VICODIN) 5-325 MG tablet    Sig: Take 1 tablet by mouth every 6 (six) hours as needed for moderate pain.    Dispense:  20 tablet    Refill:  0   Patient Instructions  Without any rash it would be less likely that you had shingles.  I am suspicious for a condition called occipital neuralgia or pain coming from the nerves in the back of the scalp.  I will refer you to neurology, but in the meantime try increasing the gabapentin to twice per day for the next few days, then 3 times per day if still having breakthrough pain.  Watch for new side effects on that medication. Let me know when you are running low and I can refill depending on how often you are taking it   Hydrocodone only if needed for breakthrough pain. That should not be combined with klonopin.   Return to the clinic or go to the nearest emergency room if any of your symptoms worsen or new symptoms occur.  Hang in there.      Signed,   Meredith Staggers, MD [facility], [facility] 09/27/22 5:34 PM

## 2022-09-28 ENCOUNTER — Telehealth: Payer: Self-pay | Admitting: Family Medicine

## 2022-09-28 ENCOUNTER — Emergency Department (HOSPITAL_COMMUNITY)
Admission: EM | Admit: 2022-09-28 | Discharge: 2022-09-29 | Disposition: A | Discharge status: Home / Self Care | Payer: Medicare Other | Attending: Emergency Medicine | Admitting: Emergency Medicine

## 2022-09-28 ENCOUNTER — Encounter: Payer: Self-pay | Admitting: Family Medicine

## 2022-09-28 ENCOUNTER — Other Ambulatory Visit: Payer: Self-pay

## 2022-09-28 DIAGNOSIS — R791 Abnormal coagulation profile: Secondary | ICD-10-CM | POA: Diagnosis not present

## 2022-09-28 DIAGNOSIS — J449 Chronic obstructive pulmonary disease, unspecified: Secondary | ICD-10-CM | POA: Insufficient documentation

## 2022-09-28 DIAGNOSIS — Z7902 Long term (current) use of antithrombotics/antiplatelets: Secondary | ICD-10-CM | POA: Insufficient documentation

## 2022-09-28 DIAGNOSIS — Z87891 Personal history of nicotine dependence: Secondary | ICD-10-CM | POA: Insufficient documentation

## 2022-09-28 DIAGNOSIS — H538 Other visual disturbances: Secondary | ICD-10-CM

## 2022-09-28 DIAGNOSIS — R519 Headache, unspecified: Secondary | ICD-10-CM | POA: Insufficient documentation

## 2022-09-28 MED ORDER — ONDANSETRON 4 MG PO TBDP
8.0000 mg | ORAL_TABLET | Freq: Once | ORAL | Status: AC
  Administered 2022-09-28: 8 mg via ORAL
  Filled 2022-09-28: qty 2

## 2022-09-28 MED ORDER — OXYCODONE-ACETAMINOPHEN 5-325 MG PO TABS
1.0000 | ORAL_TABLET | Freq: Once | ORAL | Status: AC
  Administered 2022-09-28: 1 via ORAL
  Filled 2022-09-28: qty 1

## 2022-09-28 NOTE — Telephone Encounter (Signed)
Called patient to discuss this. Pt notes she has not yet been called for appt for Neuro and hoped that having an MRI of the head and neck would help increase the urgency to get into neuro.   Please advise

## 2022-09-28 NOTE — Telephone Encounter (Signed)
Called patient.  Taking hydrocodone every 6 hours. Pain so severe in afternoon she is unable to put on her glasses.  No relief with gabapentin this morning, did not take dose last night. Took hydrocodone twice today. Did help some, but overall feels worse today.  Same pain into ear and side of neck. No difficulty with speech or focal weakness. No photo/phonophobia.   Blurry vision yesterday morning, had to wear sunglasses. Blurry vision then resolved after about an hour. Not noted currently.  When feels heartbeat in neck, that is when she feels pain, into side of neck, ear, and head. Worse today as above.     She has a history of left subclavian artery stenosis, 99% with angiography, and retrograde filling of the left vertebral system.  Successful stenting by Dr. Gery Pray in 2014.  Improvement in subclavian steal symptoms at that time.  Has had ongoing carotid Doppler studies that have been stable, prior mild plaque.Last Doppler noted from January 2023.  1 to 39% stenosis in the right carotid, 1 to 39% stenosis in the left carotid, with greater than 50% plaque in the CCA.  Vertebrals indicated antegrade flow.  Status post left subclavian artery stent with turbulent flow.  Symptoms still appear to be suspicious for occipital neuralgia but with worsening symptoms, prior vascular steal as above, and reported episode of blurry vision yesterday, I have recommended emergency room evaluation tonight to determine if other testing/studies indicated as well as for improved pain control. Advised patient, understanding expressed.

## 2022-09-28 NOTE — Telephone Encounter (Signed)
Please review this as soon as you are able she has called 3 times and sent MyChart messages throughout the day about this request

## 2022-09-28 NOTE — Telephone Encounter (Signed)
See separate telephone message.  Called patient for additional information.  Worsening symptoms today, episode of blurry vision yesterday.  Recommended ER evaluation tonight, see details on other note with telephone message.

## 2022-09-28 NOTE — Telephone Encounter (Signed)
Patient called back to speak with [facility]. The best contact number for patient is [phone].

## 2022-09-28 NOTE — Telephone Encounter (Signed)
Left pt a VM to call office  

## 2022-09-28 NOTE — ED Provider Triage Note (Signed)
Emergency Medicine Provider Triage Evaluation Note  Bethany Clarke , a 66 y.o. female  was evaluated in triage.  Pt complains of right-sided neck pain going on for last 2 days, feels paralyzed her neck rates in the back of her right head, she has associated blurred vision but this is since resolved, she has had carotid stents in the past, she was sent here by her primary care doctor for concerns of optic neuritis versus possible restenosis of the carotid artery..  Review of Systems  Positive: Head pain, neck pain Negative: .  Seizures, weakness  Physical Exam  BP (!) 162/95 (BP Location: Left Arm)   Pulse 75   Temp 98.1 F (36.7 C) (Oral)   Resp 20   SpO2 93%  Gen:   Awake, no distress   Resp:  Normal effort  MSK:   Moves extremities without difficulty  Other:  Cranial nerves II through XII grossly intact no difficulty with word finding following two-step commands there is no unilateral weakness present.  Medical Decision Making  Medically screening exam initiated at 11:43 PM.  Appropriate orders placed.  Bethany Clarke was informed that the remainder of the evaluation will be completed by another provider, this initial triage assessment does not replace that evaluation, and the importance of remaining in the ED until their evaluation is complete.  Lab work imaging have been ordered will need further workup.   Carroll Sage, PA-C 09/28/22 2345

## 2022-09-28 NOTE — ED Triage Notes (Addendum)
Patient reports persistent occipital headache radiating to right ear and right face for 2 weeks . Denies head injury . No emesis or photophobia.

## 2022-09-28 NOTE — Telephone Encounter (Signed)
Caller name: JAVON HUPFER  On DPR?: Yes  Call back number: [phone] (mobile)  Provider they see: [name], MD  Reason for call: Patient called to get Dr.Greene to order a mri for her, before her appt with Neuro.

## 2022-09-29 ENCOUNTER — Emergency Department (HOSPITAL_COMMUNITY): Payer: Medicare Other

## 2022-09-29 DIAGNOSIS — R519 Headache, unspecified: Secondary | ICD-10-CM | POA: Diagnosis not present

## 2022-09-29 LAB — CBC WITH DIFFERENTIAL/PLATELET
Abs Immature Granulocytes: 0.01 10*3/uL (ref 0.00–0.07)
Basophils Absolute: 0 10*3/uL (ref 0.0–0.1)
Basophils Relative: 0 %
Eosinophils Absolute: 0 10*3/uL (ref 0.0–0.5)
Eosinophils Relative: 1 %
HCT: 41.9 % (ref 36.0–46.0)
Hemoglobin: 13.5 g/dL (ref 12.0–15.0)
Immature Granulocytes: 0 %
Lymphocytes Relative: 41 %
Lymphs Abs: 2.6 10*3/uL (ref 0.7–4.0)
MCH: 32.8 pg (ref 26.0–34.0)
MCHC: 32.2 g/dL (ref 30.0–36.0)
MCV: 101.9 fL — ABNORMAL HIGH (ref 80.0–100.0)
Monocytes Absolute: 0.8 10*3/uL (ref 0.1–1.0)
Monocytes Relative: 12 %
Neutro Abs: 2.9 10*3/uL (ref 1.7–7.7)
Neutrophils Relative %: 46 %
Platelets: 266 10*3/uL (ref 150–400)
RBC: 4.11 MIL/uL (ref 3.87–5.11)
RDW: 12.3 % (ref 11.5–15.5)
WBC: 6.3 10*3/uL (ref 4.0–10.5)
nRBC: 0 % (ref 0.0–0.2)

## 2022-09-29 LAB — BASIC METABOLIC PANEL
Anion gap: 7 (ref 5–15)
BUN: 13 mg/dL (ref 8–23)
CO2: 29 mmol/L (ref 22–32)
Calcium: 9.6 mg/dL (ref 8.9–10.3)
Chloride: 101 mmol/L (ref 98–111)
Creatinine, Ser: 0.65 mg/dL (ref 0.44–1.00)
GFR, Estimated: >60 mL/min (ref >60)
Glucose, Bld: 113 mg/dL — ABNORMAL HIGH (ref 70–99)
Potassium: 4.7 mmol/L (ref 3.5–5.1)
Sodium: 137 mmol/L (ref 135–145)

## 2022-09-29 LAB — PROTIME-INR
INR: 1 (ref 0.8–1.2)
Prothrombin Time: 13 seconds (ref 11.4–15.2)

## 2022-09-29 MED ORDER — SODIUM CHLORIDE 0.9 % IV BOLUS
1000.0000 mL | Freq: Once | INTRAVENOUS | Status: AC
  Administered 2022-09-29: 1000 mL via INTRAVENOUS

## 2022-09-29 MED ORDER — IOHEXOL 350 MG/ML SOLN
75.0000 mL | Freq: Once | INTRAVENOUS | Status: AC | PRN
  Administered 2022-09-29: 75 mL via INTRAVENOUS

## 2022-09-29 MED ORDER — DEXAMETHASONE SODIUM PHOSPHATE 10 MG/ML IJ SOLN
10.0000 mg | Freq: Once | INTRAMUSCULAR | Status: AC
  Administered 2022-09-29: 10 mg via INTRAVENOUS
  Filled 2022-09-29: qty 1

## 2022-09-29 MED ORDER — METOCLOPRAMIDE HCL 5 MG/ML IJ SOLN
10.0000 mg | Freq: Once | INTRAMUSCULAR | Status: AC
  Administered 2022-09-29: 10 mg via INTRAVENOUS
  Filled 2022-09-29: qty 2

## 2022-09-29 MED ORDER — KETOROLAC TROMETHAMINE 30 MG/ML IJ SOLN
15.0000 mg | Freq: Once | INTRAMUSCULAR | Status: AC
  Administered 2022-09-29: 15 mg via INTRAVENOUS
  Filled 2022-09-29: qty 1

## 2022-09-29 NOTE — ED Notes (Signed)
Discharge instructions reviewed with patient. Patient denies any questions or concerns. Patient ambulatory out of ED. 

## 2022-09-29 NOTE — ED Provider Notes (Signed)
[facility] EMERGENCY DEPARTMENT Provider Note   CSN: [id number] Arrival date & time: 09/28/22  2256     History  Chief Complaint  Patient presents with   Occipital Headache    Bethany Clarke is a 66 y.o. female.  HPI Patient presents with concern of hand pain.  Pain is in the right occiput, radiating towards the right inferior auricular region severe, transient improved with oral narcotics provided earlier tonight.  Pain asked again about 2 weeks ago, she was seen primary care, diagnosed presumptively with shingles without rash.  She completed her medication, but has had persistent symptoms.  No weakness in her extremities, no speech difficulty, no facial asymmetry.  There is some new blurry vision, though this seems only in the morning.  Patient was sent here by her physician for evaluation.    Home Medications Prior to Admission medications   Medication Sig Start Date End Date Taking? Authorizing Provider  albuterol (VENTOLIN HFA) 108 (90 Base) MCG/ACT inhaler INHALE 1-2 PUFFS INTO THE LUNGS EVERY 4 (FOUR) HOURS AS NEEDED FOR WHEEZING OR SHORTNESS OF BREATH. 07/03/22   Dulce Sellar, NP  atorvastatin (LIPITOR) 40 MG tablet Take 1 tablet (40 mg total) by mouth daily. 01/30/22   [name], MD  clonazePAM (KLONOPIN) 0.5 MG tablet Take 1 tablet (0.5 mg total) by mouth 2 (two) times daily as needed for anxiety. 05/12/22   [name], MD  clopidogrel (PLAVIX) 75 MG tablet TAKE 1 TABLET BY MOUTH EVERY DAY 06/20/22   [name], MD  FLUoxetine (PROZAC) 40 MG capsule Take 1 capsule (40 mg total) by mouth daily. 01/30/22   [name], MD  gabapentin (NEURONTIN) 300 MG capsule Take 1 capsule (300 mg total) by mouth at bedtime. 09/25/22   Jarold Motto, PA  HYDROcodone-acetaminophen (NORCO/VICODIN) 5-325 MG tablet Take 1 tablet by mouth every 6 (six) hours as needed for moderate pain. 09/27/22   [name], MD  SYNTHROID 100 MCG  tablet Take 1 tablet (100 mcg total) by mouth daily before breakfast. 06/20/22   Sheliah Hatch, MD  valACYclovir (VALTREX) 1000 MG tablet Take 1,000 mg by mouth in the morning, at noon, and at bedtime.    [provider]      Allergies    Patient has no known allergies.    Review of Systems   Review of Systems  Physical Exam Updated Vital Signs BP (!) 140/73   Pulse 71   Temp 98.3 F (36.8 C) (Oral)   Resp 16   SpO2 92%  Physical Exam Vitals and nursing note reviewed.  Constitutional:      General: She is not in acute distress.    Appearance: She is well-developed.  HENT:     Head: Normocephalic and atraumatic.   Eyes:     Conjunctiva/sclera: Conjunctivae normal.  Cardiovascular:     Rate and Rhythm: Normal rate and regular rhythm.  Pulmonary:     Effort: Pulmonary effort is normal. No respiratory distress.     Breath sounds: Normal breath sounds. No stridor.  Abdominal:     General: There is no distension.  Skin:    General: Skin is warm and dry.  Neurological:     Mental Status: She is alert and oriented to person, place, and time.     Cranial Nerves: No cranial nerve deficit.     Motor: No weakness.     Coordination: Coordination normal.  Psychiatric:  Mood and Affect: Mood normal.     ED Results / Procedures / Treatments   Labs (all labs ordered are listed, but only abnormal results are displayed) Labs Reviewed  BASIC METABOLIC PANEL - Abnormal; Notable for the following components:      Result Value   Glucose, Bld 113 (*)    All other components within normal limits  CBC WITH DIFFERENTIAL/PLATELET - Abnormal; Notable for the following components:   MCV 101.9 (*)    All other components within normal limits  PROTIME-INR    EKG None  Radiology CT ANGIO HEAD NECK W WO CM  Result Date: 09/29/2022 CLINICAL DATA:  Right-sided neck pain radiating to the back of the head on the right. Blurred vision. Personal history of left  subclavian artery stenosis treated with stent. Patient sent to emergency room for concerns of optic neuritis or possible re-stenosis of the subclavian artery. Concern for subarachnoid hemorrhage. EXAM: CT ANGIOGRAPHY HEAD AND NECK TECHNIQUE: Multidetector CT imaging of the head and neck was performed using the standard protocol during bolus administration of intravenous contrast. Multiplanar CT image reconstructions and MIPs were obtained to evaluate the vascular anatomy. Carotid stenosis measurements (when applicable) are obtained utilizing NASCET criteria, using the distal internal carotid diameter as the denominator. RADIATION DOSE REDUCTION: This exam was performed according to the departmental dose-optimization program which includes automated exposure control, adjustment of the mA and/or kV according to patient size and/or use of iterative reconstruction technique. CONTRAST:  62mL OMNIPAQUE IOHEXOL 350 MG/ML SOLN COMPARISON:  None Available. FINDINGS: CT HEAD FINDINGS Brain: No acute infarct, hemorrhage, or mass lesion is present. Mild periventricular white matter hypoattenuation is present. White matter otherwise within normal limits for age. Deep brain nuclei are within normal limits. The ventricles are of normal size. No significant extraaxial fluid collection is present. The brainstem and cerebellum are within normal limits. Vascular: Atherosclerotic calcifications are present within the cavernous internal carotid arteries. No hyperdense vessel is present. Skull: Calvarium is intact. No focal lytic or blastic lesions are present. No significant extracranial soft tissue lesion is present. Sinuses/Orbits: Chronic wall thickening in shrunken sinus is noted in the right maxillary sinus. The paranasal sinuses and mastoid air cells are otherwise clear. The globes and orbits are within normal limits. Review of the MIP images confirms the above findings CTA NECK FINDINGS Aortic arch: A 3 vessel arch configuration  is present. Atherosclerotic calcifications are present at the aortic arch including the origins of the great vessels without focal stenosis. Prominent calcifications are present at the ostium of the left subclavian artery. A stent is in place and patent. No evidence for residual recurrent subclavian artery stenosis is present. The more distal subclavian artery is patent including the origin of the left vertebral artery. Right carotid system: The right common carotid artery is within normal limits. Atherosclerotic calcifications are present at the right carotid bifurcation and proximal right ICA without a significant stenosis relative to the more distal vessel. Left carotid system: The left common carotid artery is within normal limits. More prominent calcifications are present in the proximal left ICA. The lumen is narrowed to 2.1 mm. This compares with a more distal lumen of 5.7 mm. No distal stenoses are present. Vertebral arteries: Right vertebral artery is the dominant vessel. Both vertebral arteries originate from the subclavian arteries. No significant stenosis is present in either vertebral artery in the neck. Skeleton: Mild degenerative changes are present lower cervical spine. Patient is edentulous. No focal osseous lesions are present. Other  neck: Soft tissues the neck are otherwise unremarkable. Salivary glands are within normal limits. Thyroid is normal. No significant adenopathy is present. No focal mucosal or submucosal lesions are present. Upper chest: Centrilobular emphysematous changes are present. No nodule mass lesion or focal airspace opacity is present. Minimal atelectasis is present. Thoracic inlet is within normal limits. Review of the MIP images confirms the above findings CTA HEAD FINDINGS Anterior circulation: Mild atherosclerotic calcifications are present within the cavernous internal carotid arteries bilaterally. No significant stenosis is present through the ICA termini. The A1 and M1  segments are normal. The anterior communicating artery is patent. MCA bifurcations are within normal limits. The ACA and MCA branch vessels are within normal limits bilaterally. Posterior circulation: The right vertebral artery is the dominant vessel. PICA origins are visualized and normal. Vertebrobasilar junction and basilar artery are normal. The basilar artery is centrally terminates at the superior cerebellar arteries. A small left P1 segment is present. Fetal type posterior cerebral arteries are present bilaterally. PCA branch vessels are within normal limits bilaterally. Venous sinuses: The dural sinuses are patent. The straight sinus and deep cerebral veins are intact. Cortical veins are within normal limits. No significant vascular malformation is evident. Anatomic variants: Fetal type posterior cerebral arteries bilaterally. A left P1 segment is present. Review of the MIP images confirms the above findings IMPRESSION: 1. Patent left subclavian artery stent without evidence for residual or recurrent stenosis. 2. Atherosclerotic changes at the proximal left internal carotid artery with a 60-70% stenosis. 3. Proximal right internal carotid artery without significant stenosis relative to the more distal vessels. 4. Mild atherosclerotic calcifications within the cavernous internal carotid arteries bilaterally without significant stenosis. 5. Fetal type posterior cerebral arteries bilaterally without significant proximal stenosis, aneurysm, or branch vessel occlusion within the Circle of Willis. 6. Aortic Atherosclerosis (ICD10-I70.0) and Emphysema (ICD10-J43.9). Electronically Signed   By: Marin Roberts M.D.   On: 09/29/2022 08:53   DG Chest 2 View  Result Date: 09/29/2022 CLINICAL DATA:  Headache, visual disturbances. EXAM: CHEST - 2 VIEW COMPARISON:  September 14, 2021. FINDINGS: The heart size and mediastinal contours are within normal limits. Mild bibasilar subsegmental atelectasis is noted. The  visualized skeletal structures are unremarkable. IMPRESSION: Mild bibasilar subsegmental atelectasis. Aortic Atherosclerosis (ICD10-I70.0). Electronically Signed   By: Lupita Raider M.D.   On: 09/29/2022 08:36    Procedures Procedures    Medications Ordered in ED Medications  ondansetron (ZOFRAN-ODT) disintegrating tablet 8 mg (8 mg Oral Given 09/28/22 2348)  oxyCODONE-acetaminophen (PERCOCET/ROXICET) 5-325 MG per tablet 1 tablet (1 tablet Oral Given 09/28/22 2348)  sodium chloride 0.9 % bolus 1,000 mL (1,000 mLs Intravenous New Bag/Given 09/29/22 0748)  ketorolac (TORADOL) 30 MG/ML injection 15 mg (15 mg Intravenous Given 09/29/22 0747)  metoCLOPramide (REGLAN) injection 10 mg (10 mg Intravenous Given 09/29/22 0748)  dexamethasone (DECADRON) injection 10 mg (10 mg Intravenous Given 09/29/22 0748)  iohexol (OMNIPAQUE) 350 MG/ML injection 75 mL (75 mLs Intravenous Contrast Given 09/29/22 0834)    ED Course/ Medical Decision Making/ A&P This patient with a Hx of COPD, subclavian stenosis presents to the ED for concern of head pain, possible new blurry vision, this involves an extensive number of treatment options, and is a complaint that carries with it a high risk of complications and morbidity.    The differential diagnosis includes shingles without rash, intracranial phenomena, arterial occlusion, headache, dehydration, infection   Social Determinants of Health:  Prior smoker  Additional history obtained:  Additional history and/or  information obtained from chart review, notable for history of subclavian stenosis requiring stent   After the initial evaluation, orders, including: Orders from triage augmented with additional labs, x-ray, IV analgesics, fluids were initiated.   The patient was also maintained on pulse oximetry. The readings were typically 99   On repeat evaluation of the patient resolved  Lab Tests:  I personally interpreted labs.  The pertinent results  include: Unremarkable labs  Imaging Studies ordered:  I independently visualized and interpreted imaging which showed CT, CT angiography head, neck, unremarkable, with patent flow I agree with the radiologist interpretation   Dispostion / Final MDM:  After consideration of the diagnostic results and the patient's response to treatment, adult female with history of subclavian arterial disease, carotid disease presents with persistent head, neck pain, some blurry vision.  She is awake, alert, overtly neurologically unremarkable, has no evidence for meningitis, bacteremia, sepsis.  CT imaging, inconsistent with arterial lesion as well, symptoms improved with analgesics here. he will follow-up with neuro, primary care as needed.  Final Clinical Impression(s) / ED Diagnoses Final diagnoses:  Bad headache  Blurry vision    Rx / DC Orders ED Discharge Orders     None         Gerhard MunchLockwood, Love Milbourne, MD 09/29/22 [phone]

## 2022-09-29 NOTE — Discharge Instructions (Signed)
As discussed, your evaluation today has been largely reassuring.  But, it is important that you monitor your condition carefully, and do not hesitate to return to the ED if you develop new, or concerning changes in your condition. ? ?Otherwise, please follow-up with your physician for appropriate ongoing care. ? ?

## 2022-10-02 ENCOUNTER — Telehealth: Payer: Self-pay | Admitting: Family Medicine

## 2022-10-02 NOTE — Telephone Encounter (Signed)
Note reviewed regarding referral, and reviewed ER notes, reassuring CTA, follow-up with neuro recommended, ER notes indicated improvement with treatment given.  I do see she has an appointment in 2 days with Dr. Terrace Arabia.  Called to check status.  Same pain- R occiput, back right of head, to ear, and pain below ear area by jawline.  No pain to eat/chew.  No rash No change in hearing No pain in mouth or tongue.  No change in taste.  Consistent pain. Sometimes shooting pain into ear - stabbing pain in ear. Not at temple Hydrocodone midday and at night. Helps pain. Taking gabapentin initially in am, then 2pm hydrocodone, gabapentin 11pm. Sleeping through the night, but wakes up to pain once b/t 3 and 5, then back to sleep after heating pad.  No known side effects with gabapentin. Denies dizziness.   Based on location of pain including superficial pain to the scalp, still suspicious for nerve cause of pain including C2 based on posterior scalp, behind the ear/posterior ear and just below the ear to the jawline, (greater occipital nerve, lesser occipital nerve, great auricular nerve?).  Does receive some relief with hydrocodone but will try to increase gabapentin to 600 mg with morning dose tomorrow with potential side effects discussed, continue 300 mg at night initially, has hydrocodone and sufficient meds until follow-up with neuro in 2 days.  All questions answered.

## 2022-10-04 ENCOUNTER — Encounter: Payer: Self-pay | Admitting: Neurology

## 2022-10-04 ENCOUNTER — Other Ambulatory Visit: Payer: Self-pay | Admitting: Family Medicine

## 2022-10-04 ENCOUNTER — Ambulatory Visit (INDEPENDENT_AMBULATORY_CARE_PROVIDER_SITE_OTHER): Payer: Medicare Other | Admitting: Neurology

## 2022-10-04 VITALS — BP 130/84 | HR 77 | Ht 65.0 in | Wt 171.5 lb

## 2022-10-04 DIAGNOSIS — M5481 Occipital neuralgia: Secondary | ICD-10-CM | POA: Diagnosis not present

## 2022-10-04 DIAGNOSIS — M542 Cervicalgia: Secondary | ICD-10-CM | POA: Diagnosis not present

## 2022-10-04 NOTE — Progress Notes (Signed)
Chief Complaint  Patient presents with   New Patient (Initial Visit)    Rm 14. Alone. NX [place] 2014 / Internal referral for occipital headache and neck pain, ? Occipital neurologia. C/o sensitivity to touch in occipital area.      ASSESSMENT AND PLAN  Bethany Clarke is a 66 y.o. female   Right occipital area pain,  Following a fall a week earlier, landed on her right occipital regions.  Consistent with right occipital neuralgia,  Nerve block today, see separate injection note,   Celebrex as needed, also suggested warm compression, neck stretching exercise, massage,  Call clinic for worsening issues  DIAGNOSTIC DATA (LABS, IMAGING, TESTING) - I reviewed patient records, labs, notes, testing and imaging myself where available.  Lab in 2023: CMP.   MEDICAL HISTORY:  Bethany Clarke is a 66 year old female, seen in request by Dr. Meredith Staggers R, for evaluation of right occipital area pain, initial evaluation October 04, 2022  I reviewed and summarized the referring note. PMHX.  HLD Depression anxiety Hypothyroidism Left subclavian artery stenosis, s/p stent, presenting with left arm claudication in 2016 Used to smoke 1ppd, quit 2015. Carotid artery stenosis  She fell on September 08 2022, fell backwards landed on her right occipital region, no loss of consciousness, was able to get up, but a week later, she noticed gradual onset building up pain at the right occipital region, radiating to right parietal right ear, at its worst she felt like a pencil stick inside her right ear, variable degree, sometimes worse than the other, difficulty sleep, tenderness of right nuchal area, sensitivity of adjacent right scalp,  She was initially evaluated by a local urgent care, worried about the possibility of shingles, was treated with Valtrex 1000 mg 3 times a day along with steroid, did not help her symptoms, actually right occipital area so painful, she went to emergency room  September 28, 2022, Toradol, oxycodone as needed did not help her pain  She denies radiating pain to right shoulder, denies left arm involvement denies bowel bladder incontinence, denies gait abnormality  She previously was a heavy smoker, had a history of peripheral vascular disease, near occlusion left subclavian artery severe stenosis, presenting with left arm vascular claudication requiring stent treatment in 2016, which did help her symptoms, on Plavix 75 mg daily    I personally reviewed CT angiogram of head and neck on September 29, 2022 1. Patent left subclavian artery stent without evidence for residual or recurrent stenosis. 2. Atherosclerotic changes at the proximal left internal carotid artery with a 60-70% stenosis. 3. Proximal right internal carotid artery without significant stenosis relative to the more distal vessels. 4. Mild atherosclerotic calcifications within the cavernous internal carotid arteries bilaterally without significant stenosis. 5. Fetal type posterior cerebral arteries bilaterally without significant proximal stenosis, aneurysm, or branch vessel occlusion within the Circle of Willis. 6. Aortic Atherosclerosis (ICD10-I70.0) and Emphysema (ICD10-J43.9).    PHYSICAL EXAM:   Vitals:   10/04/22 1405  BP: 130/84  Pulse: 77  Weight: 171 lb 8 oz (77.8 kg)  Height: 5\' 5"  (1.651 m)   Not recorded     Body mass index is 28.54 kg/m.  PHYSICAL EXAMNIATION:  Gen: NAD, conversant, well nourised, well groomed                     Cardiovascular: Regular rate rhythm, no peripheral edema, warm, nontender. Eyes: Conjunctivae clear without exudates or hemorrhage Neck: Supple, no carotid bruits. Pulmonary: Clear to  auscultation bilaterally   NEUROLOGICAL EXAM:  MENTAL STATUS: Speech/cognition: Awake, alert, oriented to history taking and casual conversation CRANIAL NERVES: CN II: Visual fields are full to confrontation. Pupils are round equal and briskly  reactive to light. CN III, IV, VI: extraocular movement are normal. No ptosis. CN V: Facial sensation is intact to light touch CN VII: Face is symmetric with normal eye closure  CN VIII: Hearing is normal to causal conversation. CN IX, X: Phonation is normal. CN XI: Head turning and shoulder shrug are intact  MOTOR: Significant tenderness along the right nuchal line, especially at the exit point of right greater and lesser occipital nerve There is no pronator drift of out-stretched arms. Muscle bulk and tone are normal. Muscle strength is normal.  REFLEXES: Reflexes are 2+ and symmetric at the biceps, triceps, knees, and ankles. Plantar responses are flexor.  SENSORY: Intact to light touch, pinprick and vibratory sensation are intact in fingers and toes.  COORDINATION: There is no trunk or limb dysmetria noted.  GAIT/STANCE: Posture is normal. Gait is steady   REVIEW OF SYSTEMS:  Full 14 system review of systems performed and notable only for as above All other review of systems were negative.   ALLERGIES: No Known Allergies  HOME MEDICATIONS: Current Outpatient Medications  Medication Sig Dispense Refill   albuterol (VENTOLIN HFA) 108 (90 Base) MCG/ACT inhaler INHALE 1-2 PUFFS INTO THE LUNGS EVERY 4 (FOUR) HOURS AS NEEDED FOR WHEEZING OR SHORTNESS OF BREATH. 18 g 2   atorvastatin (LIPITOR) 40 MG tablet Take 1 tablet (40 mg total) by mouth daily. 90 tablet 2   clonazePAM (KLONOPIN) 0.5 MG tablet Take 1 tablet (0.5 mg total) by mouth 2 (two) times daily as needed for anxiety. 20 tablet 1   clopidogrel (PLAVIX) 75 MG tablet TAKE 1 TABLET BY MOUTH EVERY DAY 90 tablet 2   FLUoxetine (PROZAC) 40 MG capsule Take 1 capsule (40 mg total) by mouth daily. 90 capsule 3   gabapentin (NEURONTIN) 300 MG capsule Take 1 capsule (300 mg total) by mouth at bedtime. (Patient taking differently: Take 300 mg by mouth at bedtime. Take two tablets every 6 hours.) 30 capsule 0    HYDROcodone-acetaminophen (NORCO/VICODIN) 5-325 MG tablet Take 1 tablet by mouth every 6 (six) hours as needed for moderate pain. (Patient taking differently: Take 1 tablet by mouth every 6 (six) hours.) 20 tablet 0   SYNTHROID 100 MCG tablet Take 1 tablet (100 mcg total) by mouth daily before breakfast. 90 tablet 2   valACYclovir (VALTREX) 1000 MG tablet Take 1,000 mg by mouth in the morning, at noon, and at bedtime. (Patient not taking: Reported on 10/04/2022)     Current Facility-Administered Medications  Medication Dose Route Frequency Provider Last Rate Last Admin   ipratropium (ATROVENT) nebulizer solution 0.5 mg  0.5 mg Nebulization Once Shade FloodGreene, Jeffrey R, MD        PAST MEDICAL HISTORY: Past Medical History:  Diagnosis Date   Atherosclerosis 11/14/2005   carotid US - mild calcific non-stenotic plaque bilaterally   Carotid bruit 02/05/2009   Doppler - R/L ICAs 0-49% diameter reductin (velocities suggest low-mid range); L subclavian 0-49%  diameter reduction   Chronic bronchitis (HCC)    "growing up; not anymore" (03/14/2013)   Colon polyp    hyperplastic   Costochondritis    Dizziness    Emphysema of lung (HCC)    GERD (gastroesophageal reflux disease)    H. pylori infection    Heart burn  Hemorrhoids    Hypothyroid    Lipidemia    Pneumonia ~ 2009   Subclavian artery stenosis, left (HCC)    subclavian steal physiology status post PTA and stenting   Syncope, near     PAST SURGICAL HISTORY: Past Surgical History:  Procedure Laterality Date   ABDOMINAL HYSTERECTOMY  1999   BILATERAL UPPER EXTREMITY ANGIOGRAM N/A 03/14/2013   Procedure: BILATERAL UPPER EXTREMITY ANGIOGRAM;  Surgeon: [name], MD;  Location: [facility] CATH [facility];  Service: Cardiovascular;  Laterality: N/A;   BREAST BIOPSY Right 1990's   "benign" (03/14/2013)   CARDIAC CATHETERIZATION  03/03/2013   DILATION AND CURETTAGE OF UTERUS  1980's   KNEE ARTHROSCOPY WITH MENISCAL REPAIR Left 07/21/2021   Procedure:  Arthroscopic debridement, arthroscopic debridement of bucket-handle component of lateral meniscus tear, and arthroscopically-assisted repair of lateral meniscus root tear, left knee. ;  Surgeon: Christena Flake, MD;  Location: [facility] ORS;  Service: Orthopedics;  Laterality: Left;   LEFT HEART CATHETERIZATION WITH CORONARY ANGIOGRAM Bilateral 03/03/2013   Procedure: LEFT HEART CATHETERIZATION WITH CORONARY ANGIOGRAM;  Surgeon: Lennette Bihari, MD;  Location: [facility] CATH LAB;  Service: Cardiovascular;  Laterality: Bilateral;   PERCUTANEOUS STENT INTERVENTION  03/14/2013   Procedure: PERCUTANEOUS STENT INTERVENTION;  Surgeon: [name], MD;  Location: [facility] CATH LAB;  Service: Cardiovascular;;   SUBCLAVIAN ARTERY STENT Left 03/14/2013    for subclavian steal/claudication (03/14/2013)   TUBAL LIGATION  1980's    FAMILY HISTORY: Family History  Problem Relation Age of Onset   Cancer Brother        spine/lung   COPD Brother        smoked   Hypertension Brother    Rheum arthritis Brother    Hyperlipidemia Brother    Hypertension Brother    Diabetes Mother    Heart disease Mother    Stroke Mother    Cancer Father        cirrohosis   Hypertension Father    Heart disease Sister    Diabetes Sister    Hyperlipidemia Sister    Hypertension Sister    COPD Sister    Arthritis Sister        rheumatoid   Diabetes Sister    Arthritis Sister        rheumatoid   Colon cancer Neg Hx     SOCIAL HISTORY: Social History   Socioeconomic History   Marital status: Married    Spouse name: Renae Fickle   Number of children: 3   Years of education: Not on file   Highest education level: Some college, no degree  Occupational History   Occupation: Orthoptist: [employer]  Tobacco Use   Smoking status: Former    Packs/day: 0.50    Years: 40.00    Total pack years: 20.00    Types: Cigarettes    Quit date: 02/11/2013    Years since quitting: 9.6   Smokeless tobacco: Never  Substance  and Sexual Activity   Alcohol use: No   Drug use: No   Sexual activity: Yes    Comment: number of sex partners in the last 12 months  1  Other Topics Concern   Not on file  Social History Narrative   Daily caffeine. Married. Education: [employer]. Exercise: No.      Patient is right-handed. She lives with her husband in a two level home. She drinks 2 cups of coffee a day. 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   Social Determinants of Health   Financial Resource Strain: Low Risk  (08/08/2022)   Overall Financial Resource Strain (CARDIA)    Difficulty of Paying Living Expenses: Not hard at all  Food Insecurity: No Food Insecurity (08/08/2022)   Hunger Vital Sign    Worried About Running Out of Food in the Last Year: Never true    Ran Out of Food in the Last Year: Never true  Transportation Needs: No Transportation Needs (08/08/2022)   PRAPARE - Administrator, Civil Service (Medical): No    Lack of Transportation (Non-Medical): No  Physical Activity: Sufficiently Active (08/08/2022)   Exercise Vital Sign    Days of Exercise per Week: 7 days    Minutes of Exercise per Session: 30 min  Stress: No Stress Concern Present (08/08/2022)   [employer] of Occupational Health - Occupational Stress Questionnaire    Feeling of Stress : Not at all  Social Connections: Moderately Integrated (08/08/2022)   Social Connection and Isolation Panel [NHANES]    Frequency of Communication with Friends and Family: More than three times a week    Frequency of Social Gatherings with Friends and Family: More than three times a week    Attends Religious Services: Never    Database administrator or Organizations: Yes    Attends Engineer, structural: More than 4 times per year    Marital Status: Married  Catering manager Violence: Not At Risk (08/08/2022)   Humiliation, Afraid, Rape, and Kick questionnaire    Fear of Current or Ex-Partner: No    Emotionally Abused:  No    Physically Abused: No    Sexually Abused: No      Levert Feinstein, M.D. Ph.D.  [facility] [address] [place], [place] [address] Ph: [phone] Fax: [phone]  CC:  [name], MD [address] [place],  [place] [address]  [name], MD

## 2022-10-04 NOTE — Progress Notes (Signed)
Right Occipital nerve block   0.5% Marcaine 2 cc mixed with 2 cc of betamethasone,   Palpate the landmark of external occipital protuberance, and right mastoid process,   Localize Right greater occipital nerve, 2 cm lateral to occipital external protuberance.  also the most tender spot upon deep palpitation  Lesser occipital nerve, along right nuchal line, more lateral than the greater occipital nerve   Injection was performed to the right great occipital nerve first, fanned the injection to the surrounding area, mostly along the right nuchal line  Patient tolerated injection well

## 2022-10-04 NOTE — Telephone Encounter (Signed)
Encourage patient to contact the pharmacy for refills or they can request refills through [facility]  (Please schedule appointment if patient has not been seen in over a year)    WHAT PHARMACY WOULD THEY LIKE THIS SENT TO: [id number] - [place], [place] - [address]   MEDICATION NAME & DOSE: gabapentin 300 mg   NOTES/COMMENTS FROM PATIENT: Patient states on her last OV with Dr.Greene. Dr.Greene increased her medication in take. Patient has one day with worth medication left.     Front office please notify patient: It takes 48-72 hours to process rx refill requests Ask patient to call pharmacy to ensure rx is ready before heading there.

## 2022-10-05 ENCOUNTER — Encounter: Payer: Self-pay | Admitting: Family Medicine

## 2022-10-05 DIAGNOSIS — R519 Headache, unspecified: Secondary | ICD-10-CM

## 2022-10-05 DIAGNOSIS — H9201 Otalgia, right ear: Secondary | ICD-10-CM

## 2022-10-05 DIAGNOSIS — M542 Cervicalgia: Secondary | ICD-10-CM

## 2022-10-05 MED ORDER — GABAPENTIN 300 MG PO CAPS: 300.0000 mg | ORAL_CAPSULE | Freq: Three times a day (TID) | ORAL | 0 refills | Status: DC

## 2022-10-05 MED ORDER — HYDROCODONE-ACETAMINOPHEN 5-325 MG PO TABS: 1.0000 | ORAL_TABLET | Freq: Four times a day (QID) | ORAL | 0 refills | Status: DC | PRN

## 2022-10-05 NOTE — Addendum Note (Signed)
Addended byJaci Lazier, Darragh Nay on: 10/05/2022 10:25 AM   Modules accepted: Orders

## 2022-10-05 NOTE — Telephone Encounter (Signed)
Gabapentin 300 mg LOV: 09/25/22 Last /Refill:/09/25/22 Upcoming appt: 11/15/22  Pt was seen on 09/25/22 and OV states increasing the gabapentin to twice per day for the next few days, then 3 times per day if still having breakthrough pain  She states she is out of this medication

## 2022-10-05 NOTE — Telephone Encounter (Signed)
See other message. Meds refilled

## 2022-10-05 NOTE — Telephone Encounter (Signed)
Patient called in regarding the response from Angela's my chart message. Her Gabapentin was increased and she is taking 600 mg every 6 hours (two 300 mg tabs) and her hydrocodone was sent in with only twenty tablets which was not enough for a month supply.   Patient was wanting this to be corrected. I have pended meds and sent to [facility].

## 2022-10-06 NOTE — Telephone Encounter (Signed)
Patient sending update.

## 2022-10-19 ENCOUNTER — Telehealth: Payer: Self-pay | Admitting: Neurology

## 2022-10-19 ENCOUNTER — Encounter: Payer: Self-pay | Admitting: Neurology

## 2022-10-19 NOTE — Telephone Encounter (Signed)
Pt was called and has been scheduled with wait list.

## 2022-10-19 NOTE — Telephone Encounter (Signed)
Pt is asking for a call back to know when Dr Krista Blue wants her to f/u , and also to discuss what is suggested at this point since the nerve block has worn off, please call.

## 2022-10-25 ENCOUNTER — Other Ambulatory Visit: Payer: Self-pay | Admitting: Family Medicine

## 2022-10-25 DIAGNOSIS — H9201 Otalgia, right ear: Secondary | ICD-10-CM

## 2022-10-25 DIAGNOSIS — R519 Headache, unspecified: Secondary | ICD-10-CM

## 2022-10-25 DIAGNOSIS — M542 Cervicalgia: Secondary | ICD-10-CM

## 2022-10-25 NOTE — Telephone Encounter (Signed)
Gabapentin 300 mg LOV: 09/27/22 Last Refill:10/05/22 Upcoming appt: no apt

## 2022-11-14 ENCOUNTER — Ambulatory Visit (HOSPITAL_COMMUNITY)
Admission: RE | Admit: 2022-11-14 | Discharge: 2022-11-14 | Disposition: A | Discharge status: Home / Self Care | Payer: Medicare Other | Source: Ambulatory Visit | Attending: Cardiology | Admitting: Cardiology

## 2022-11-14 DIAGNOSIS — I6523 Occlusion and stenosis of bilateral carotid arteries: Secondary | ICD-10-CM

## 2022-11-15 ENCOUNTER — Ambulatory Visit (INDEPENDENT_AMBULATORY_CARE_PROVIDER_SITE_OTHER): Payer: Medicare Other | Admitting: Family Medicine

## 2022-11-15 ENCOUNTER — Encounter: Payer: Self-pay | Admitting: Family Medicine

## 2022-11-15 DIAGNOSIS — E039 Hypothyroidism, unspecified: Secondary | ICD-10-CM

## 2022-11-15 DIAGNOSIS — M542 Cervicalgia: Secondary | ICD-10-CM

## 2022-11-15 DIAGNOSIS — E785 Hyperlipidemia, unspecified: Secondary | ICD-10-CM

## 2022-11-15 DIAGNOSIS — F418 Other specified anxiety disorders: Secondary | ICD-10-CM | POA: Diagnosis not present

## 2022-11-15 DIAGNOSIS — H9201 Otalgia, right ear: Secondary | ICD-10-CM | POA: Diagnosis not present

## 2022-11-15 DIAGNOSIS — R519 Headache, unspecified: Secondary | ICD-10-CM

## 2022-11-15 DIAGNOSIS — G458 Other transient cerebral ischemic attacks and related syndromes: Secondary | ICD-10-CM

## 2022-11-15 LAB — LIPID PANEL
Cholesterol: 137 mg/dL (ref 0–200)
HDL: 58 mg/dL (ref >39.00)
LDL Cholesterol: 65 mg/dL (ref 0–99)
NonHDL: 78.94
Total CHOL/HDL Ratio: 2
Triglycerides: 71 mg/dL (ref 0.0–149.0)
VLDL: 14.2 mg/dL (ref 0.0–40.0)

## 2022-11-15 LAB — COMPREHENSIVE METABOLIC PANEL
ALT: 33 U/L (ref 0–35)
AST: 24 U/L (ref 0–37)
Albumin: 4.7 g/dL (ref 3.5–5.2)
Alkaline Phosphatase: 92 U/L (ref 39–117)
BUN: 12 mg/dL (ref 6–23)
CO2: 32 mEq/L (ref 19–32)
Calcium: 10.3 mg/dL (ref 8.4–10.5)
Chloride: 98 mEq/L (ref 96–112)
Creatinine, Ser: 0.69 mg/dL (ref 0.40–1.20)
GFR: 90.5 mL/min (ref >60.00)
Glucose, Bld: 84 mg/dL (ref 70–99)
Potassium: 4.6 mEq/L (ref 3.5–5.1)
Sodium: 137 mEq/L (ref 135–145)
Total Bilirubin: 0.3 mg/dL (ref 0.2–1.2)
Total Protein: 7.8 g/dL (ref 6.0–8.3)

## 2022-11-15 LAB — TSH: TSH: 0.06 u[IU]/mL — ABNORMAL LOW (ref 0.35–5.50)

## 2022-11-15 MED ORDER — SYNTHROID 100 MCG PO TABS: 100.0000 ug | ORAL_TABLET | Freq: Every day | ORAL | 2 refills | Status: DC

## 2022-11-15 MED ORDER — ATORVASTATIN CALCIUM 40 MG PO TABS: 40.0000 mg | ORAL_TABLET | Freq: Every day | ORAL | 2 refills | Status: DC

## 2022-11-15 MED ORDER — CLOPIDOGREL BISULFATE 75 MG PO TABS: 75.0000 mg | ORAL_TABLET | Freq: Every day | ORAL | 2 refills | Status: DC

## 2022-11-15 MED ORDER — GABAPENTIN 300 MG PO CAPS: 600.0000 mg | ORAL_CAPSULE | Freq: Three times a day (TID) | ORAL | 3 refills | Status: DC

## 2022-11-15 MED ORDER — FLUOXETINE HCL 40 MG PO CAPS: 40.0000 mg | ORAL_CAPSULE | Freq: Every day | ORAL | 3 refills | Status: DC

## 2022-11-15 NOTE — Patient Instructions (Addendum)
Try higher dose of gabapentin at night - 900mg  - if needed. I will refill your meds today. Let me know if we need to increase dose further.  If any concerns on labs I will let you know.

## 2022-11-15 NOTE — Progress Notes (Signed)
Subjective:  Patient ID: Bethany Clarke, female    DOB: 02/20/1956  Age: 67 y.o. MRN: [id number]  CC:  Chief Complaint  Patient presents with   Depression    PHQ-9=10   Hyperlipidemia    HPI Bethany Clarke presents for   Neck pain, cervico-occipital neuralgia of right side See prior notes and recent MyChart messages.  Has been seen by neurology, 10/04/2022,Right occipital nerve block at that time.  Was continuing to take gabapentin 600 mg 3 times daily.  I have also prescribed hydrocodone if needed for more severe/breakthrough pain.  She did have some initial improvement with nerve block but also some persistent symptoms, recent message as noted to neurology and she is on a wait list for an appointment.  Currently scheduled March 12. Ran out of gabapentin. Was helping some but some residual pain. Less pain than initial. No side effects of higher dose gabapentin.  No recent need for hydrocodone. Heating pad at times.    Cardiac: With history of subclavian steal, subclavian artery stenosis, status post stenting in 2014, continued on aspirin, Plavix, cardiology Dr. Gwenlyn Found, Dr. Claiborne Billings primary cardiologist.  Recent carotid ultrasound yesterday.  She did undergo CT angiogram 09/28/2022 with patent left subclavian artery stent without residual recurrent stenosis.  Atherosclerotic changes proximal left internal carotid 60 to 70% stenosis. Hyperlipidemia treated with Lipitor 40 mg daily.  Fasting today.  BP Readings from Last 3 Encounters:  11/15/22 114/60  10/04/22 130/84  09/29/22 (!) 140/73   Lab Results  Component Value Date   CREATININE 0.65 09/28/2022   Lab Results  Component Value Date   ALT 21 05/12/2022   AST 19 05/12/2022   ALKPHOS 85 05/12/2022   BILITOT 0.4 05/12/2022   Lab Results  Component Value Date   CHOL 148 01/12/2022   HDL 63.70 01/12/2022   LDLCALC 57 01/12/2022   TRIG 135.0 01/12/2022   CHOLHDL 2 01/12/2022   Hypothyroidism: Lab Results   Component Value Date   TSH 0.56 01/12/2022  Synthroid 100 mcg daily Taking medication daily.  No new hot or cold intolerance. No new hair or skin changes, heart palpitations or new fatigue. No new weight changes.   COPD Quit smoking over 7 years ago.  Possible combined picture of asthma, COPD.  Did have some reversibility/improvement with albuterol previously.  We discussed pulmonary follow-up in the past but that has been declined.  Albuterol infrequently. No recent flare.   Depression with anxiety: Fluoxetine 40 mg daily.  Klonopin has been used for breakthrough anxiety previously - not needed recently. Controlled substance database reviewed, last filled #20 on 08/06/2022.  Gabapentin 300 mg #90 on 10/25/2022. Has met with therapist intermittently previously, still intermittent.  Head pain has been frustrating.  No SI.      11/15/2022    8:27 AM 09/27/2022    1:36 PM 08/08/2022   11:21 AM 05/12/2022    9:13 AM 01/12/2022    1:41 PM  Depression screen PHQ 2/9  Decreased Interest 2 1 0 2 2  Down, Depressed, Hopeless 1 1 0 2 1  PHQ - 2 Score 3 2 0 4 3  Altered sleeping 1 2  2 1   Tired, decreased energy 1 1  2 2   Change in appetite 2 0  3 0  Feeling bad or failure about yourself  1 0  2 0  Trouble concentrating 1 1  0 1  Moving slowly or fidgety/restless 1 0  0 0  Suicidal thoughts 0  0  0 0  PHQ-9 Score 10 6  13 7          History Patient Active Problem List   Diagnosis Date Noted   Neck pain on right side 10/04/2022   Cervico-occipital neuralgia of right side 10/04/2022   Chest pain, atypical 03/17/2016   Paresthesia 07/14/2013   Bilateral foot pain 05/01/2013   Subclavian artery stenosis, left (HCC)    Subclavian steal syndrome 03/16/2013   Subclavian arterial stenosis (HCC) 03/11/2013   Carotid stenosis 03/11/2013   Hyperlipidemia with target LDL less than 70 03/11/2013   GERD (gastroesophageal reflux disease)    Hypothyroid    Dizziness    Syncope, near     Heart burn    Hypothyroidism 05/28/2007   COPD GOLD II with reversibility  05/28/2007   Past Medical History:  Diagnosis Date   Atherosclerosis 11/14/2005   carotid 11/16/2005 - mild calcific non-stenotic plaque bilaterally   Carotid bruit 02/05/2009   Doppler - R/L ICAs 0-49% diameter reductin (velocities suggest low-mid range); L subclavian 0-49%  diameter reduction   Chronic bronchitis (HCC)    "growing up; not anymore" (03/14/2013)   Colon polyp    hyperplastic   Costochondritis    Dizziness    Emphysema of lung (HCC)    GERD (gastroesophageal reflux disease)    H. pylori infection    Heart burn    Hemorrhoids    Hypothyroid    Lipidemia    Pneumonia ~ 2009   Subclavian artery stenosis, left (HCC)    subclavian steal physiology status post PTA and stenting   Syncope, near    Past Surgical History:  Procedure Laterality Date   ABDOMINAL HYSTERECTOMY  1999   BILATERAL UPPER EXTREMITY ANGIOGRAM N/A 03/14/2013   Procedure: BILATERAL UPPER EXTREMITY ANGIOGRAM;  Surgeon: 03/16/2013, MD;  Location: [facility] CATH LAB;  Service: Cardiovascular;  Laterality: N/A;   BREAST BIOPSY Right 1990's   "benign" (03/14/2013)   CARDIAC CATHETERIZATION  03/03/2013   DILATION AND CURETTAGE OF UTERUS  1980's   KNEE ARTHROSCOPY WITH MENISCAL REPAIR Left 07/21/2021   Procedure: Arthroscopic debridement, arthroscopic debridement of bucket-handle component of lateral meniscus tear, and arthroscopically-assisted repair of lateral meniscus root tear, left knee. ;  Surgeon: 09/20/2021, MD;  Location: [facility] ORS;  Service: Orthopedics;  Laterality: Left;   LEFT HEART CATHETERIZATION WITH CORONARY ANGIOGRAM Bilateral 03/03/2013   Procedure: LEFT HEART CATHETERIZATION WITH CORONARY ANGIOGRAM;  Surgeon: 03/05/2013, MD;  Location: [facility] CATH LAB;  Service: Cardiovascular;  Laterality: Bilateral;   PERCUTANEOUS STENT INTERVENTION  03/14/2013   Procedure: PERCUTANEOUS STENT INTERVENTION;  Surgeon: 03/16/2013, MD;   Location: [facility] CATH LAB;  Service: Cardiovascular;;   SUBCLAVIAN ARTERY STENT Left 03/14/2013    for subclavian steal/claudication (03/14/2013)   TUBAL LIGATION  1980's   No Known Allergies Prior to Admission medications   Medication Sig Start Date End Date Taking? Authorizing Provider  albuterol (VENTOLIN HFA) 108 (90 Base) MCG/ACT inhaler INHALE 1-2 PUFFS INTO THE LUNGS EVERY 4 (FOUR) HOURS AS NEEDED FOR WHEEZING OR SHORTNESS OF BREATH. 07/03/22  Yes Hudnell, 07/05/22, NP  atorvastatin (LIPITOR) 40 MG tablet Take 1 tablet (40 mg total) by mouth daily. 01/30/22  Yes 02/01/22, MD  clopidogrel (PLAVIX) 75 MG tablet TAKE 1 TABLET BY MOUTH EVERY DAY 06/20/22  Yes 08/20/22, MD  FLUoxetine (PROZAC) 40 MG capsule Take 1 capsule (40 mg total) by mouth daily. 01/30/22  Yes 02/01/22, MD  gabapentin (NEURONTIN) 300 MG capsule TAKE 1-2 CAPSULES (300-600 MG TOTAL) BY MOUTH 3 (THREE) TIMES DAILY. 10/25/22  Yes Wendie Agreste, MD  SYNTHROID 100 MCG tablet Take 1 tablet (100 mcg total) by mouth daily before breakfast. 06/20/22  Yes Midge Minium, MD  clonazePAM (KLONOPIN) 0.5 MG tablet Take 1 tablet (0.5 mg total) by mouth 2 (two) times daily as needed for anxiety. Patient not taking: Reported on 11/15/2022 05/12/22   Wendie Agreste, MD  HYDROcodone-acetaminophen (NORCO/VICODIN) 5-325 MG tablet Take 1 tablet by mouth every 6 (six) hours as needed for moderate pain. Patient not taking: Reported on 11/15/2022 10/05/22   Wendie Agreste, MD  valACYclovir (VALTREX) 1000 MG tablet Take 1,000 mg by mouth in the morning, at noon, and at bedtime. Patient not taking: Reported on 10/04/2022    [provider]   Social History   Socioeconomic History   Marital status: Married    Spouse name: Eddie Dibbles   Number of children: 3   Years of education: Not on file   Highest education level: Some college, no degree  Occupational History   Occupation: Insurance claims handler:  [employer]  Tobacco Use   Smoking status: Former    Packs/day: 0.50    Years: 40.00    Total pack years: 20.00    Types: Cigarettes    Quit date: 02/11/2013    Years since quitting: 9.7   Smokeless tobacco: Never  Substance and Sexual Activity   Alcohol use: No   Drug use: No   Sexual activity: Yes    Comment: number of sex partners in the last 12 months  1  Other Topics Concern   Not on file  Social History Narrative   Daily caffeine. Married. Education: [employer]. Exercise: No.      Patient is right-handed. She lives with her husband in a two level home. She drinks 2 cups of coffee a day. 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   Social Determinants of Health   Financial Resource Strain: Low Risk  (08/08/2022)   Overall Financial Resource Strain (CARDIA)    Difficulty of Paying Living Expenses: Not hard at all  Food Insecurity: No Food Insecurity (08/08/2022)   Hunger Vital Sign    Worried About Running Out of Food in the Last Year: Never true    Ran Out of Food in the Last Year: Never true  Transportation Needs: No Transportation Needs (08/08/2022)   PRAPARE - Hydrologist (Medical): No    Lack of Transportation (Non-Medical): No  Physical Activity: Sufficiently Active (08/08/2022)   Exercise Vital Sign    Days of Exercise per Week: 7 days    Minutes of Exercise per Session: 30 min  Stress: No Stress Concern Present (08/08/2022)   [place]    Feeling of Stress : Not at all  Social Connections: Moderately Integrated (08/08/2022)   Social Connection and Isolation Panel [NHANES]    Frequency of Communication with Friends and Family: More than three times a week    Frequency of Social Gatherings with Friends and Family: More than three times a week    Attends Religious Services: Never    Marine scientist or Organizations: Yes    Attends Theatre manager Meetings: More than 4 times per year    Marital Status: Married  Human resources officer Violence: Not At Risk (08/08/2022)  Humiliation, Afraid, Rape, and Kick questionnaire    Fear of Current or Ex-Partner: No    Emotionally Abused: No    Physically Abused: No    Sexually Abused: No    Review of Systems  Constitutional:  Negative for fatigue and unexpected weight change.  Respiratory:  Negative for chest tightness and shortness of breath.   Cardiovascular:  Negative for chest pain, palpitations and leg swelling.  Gastrointestinal:  Negative for abdominal pain and blood in stool.  Neurological:  Negative for dizziness, syncope, light-headedness and headaches.     Objective:   Vitals:   11/15/22 0826  BP: 114/60  Pulse: 74  Temp: 98.5 F (36.9 C)  TempSrc: Oral  SpO2: 95%  Weight: 170 lb 6.4 oz (77.3 kg)  Height: 5\' 5"  (1.651 m)     Physical Exam Vitals reviewed.  Constitutional:      Appearance: Normal appearance. She is well-developed.  HENT:     Head: Normocephalic and atraumatic.  Eyes:     Conjunctiva/sclera: Conjunctivae normal.     Pupils: Pupils are equal, round, and reactive to light.  Neck:     Vascular: No carotid bruit.     Comments:  No nodules  Cardiovascular:     Rate and Rhythm: Normal rate and regular rhythm.     Heart sounds: Normal heart sounds.  Pulmonary:     Effort: Pulmonary effort is normal.     Breath sounds: Normal breath sounds.  Abdominal:     Palpations: Abdomen is soft. There is no pulsatile mass.     Tenderness: There is no abdominal tenderness.  Musculoskeletal:     Right lower leg: No edema.     Left lower leg: No edema.  Skin:    General: Skin is warm and dry.  Neurological:     Mental Status: She is alert and oriented to person, place, and time.  Psychiatric:        Mood and Affect: Mood normal.        Behavior: Behavior normal.        Assessment & Plan:  Bethany Clarke is a 67 y.o. female  . Depression with anxiety - Plan: FLUoxetine (PROZAC) 40 MG capsule  -  Stable, tolerating current regimen. Medications refilled.  Hyperlipidemia, unspecified hyperlipidemia type - Plan: atorvastatin (LIPITOR) 40 MG tablet, Comprehensive metabolic panel, Lipid panel  -Check updated labs, tolerating current dose Lipitor, continue same.  Right ear pain - Plan: gabapentin (NEURONTIN) 300 MG capsule Occipital headache - Plan: gabapentin (NEURONTIN) 300 MG capsule Neck pain on right side - Plan: gabapentin (NEURONTIN) 300 MG capsule  -Cervico-occipital neuralgia status post nerve block as above, improved but still some persistent symptoms.  Continue gabapentin with option of 900 mg dosing at night, consider higher dose if needed after week at this dosing.  Continue follow-up with neurology.  Possible side effects of meds have been discussed.  Subclavian steal syndrome - Plan: clopidogrel (PLAVIX) 75 MG tablet  -Continue Plavix, recent ultrasound results pending for carotids.  Hypothyroidism, unspecified type - Plan: SYNTHROID 100 MCG tablet, TSH  -  Stable, tolerating current regimen. Medications refilled. Labs pending as above.    Meds ordered this encounter  Medications   FLUoxetine (PROZAC) 40 MG capsule    Sig: Take 1 capsule (40 mg total) by mouth daily.    Dispense:  90 capsule    Refill:  3   atorvastatin (LIPITOR) 40 MG tablet    Sig: Take 1 tablet (40 mg  total) by mouth daily.    Dispense:  90 tablet    Refill:  2   gabapentin (NEURONTIN) 300 MG capsule    Sig: Take 2-3 capsules (600-900 mg total) by mouth 3 (three) times daily.    Dispense:  210 capsule    Refill:  3   clopidogrel (PLAVIX) 75 MG tablet    Sig: Take 1 tablet (75 mg total) by mouth daily.    Dispense:  90 tablet    Refill:  2   SYNTHROID 100 MCG tablet    Sig: Take 1 tablet (100 mcg total) by mouth daily before breakfast.    Dispense:  90 tablet    Refill:  2   Patient Instructions  Try higher dose of  gabapentin at night - 900mg  - if needed. I will refill your meds today. Let me know if we need to increase dose further.  If any concerns on labs I will let you know.       Signed,   Merri Ray, MD [place] 11/15/22 8:43 AM

## 2022-11-16 ENCOUNTER — Telehealth: Payer: Self-pay | Admitting: Cardiovascular Disease

## 2022-11-16 ENCOUNTER — Encounter: Payer: Self-pay | Admitting: Family Medicine

## 2022-11-16 NOTE — Telephone Encounter (Signed)
Pt is requesting a call back to discuss results from CT done 12/15 and CAROTID done 01/30. She states she would like Dr. Alvester Chou to look and compare both and call and give her clarification on some questions she has in regards to these.

## 2022-11-16 NOTE — Telephone Encounter (Signed)
Patient stated she wants the results from the CT angio head and neck and the carotid dopplers. She would like a comparison of the 2.

## 2022-11-17 ENCOUNTER — Encounter: Payer: Self-pay | Admitting: Family Medicine

## 2022-11-17 NOTE — Telephone Encounter (Signed)
Spoke with patient and gave her results of CT angio of head and neck and carotid dopplers per Dr. Gwenlyn Found. Patient requested an OV to discuss results. Appointment with E. Monge for 2/9.

## 2022-11-17 NOTE — Telephone Encounter (Signed)
Pt sent FYI Apt with Dr Claiborne Billings is next week, and the appt from Neuro appt moved up to Feb 8

## 2022-11-21 ENCOUNTER — Other Ambulatory Visit: Payer: Self-pay | Admitting: Family Medicine

## 2022-11-21 DIAGNOSIS — E039 Hypothyroidism, unspecified: Secondary | ICD-10-CM

## 2022-11-21 MED ORDER — SYNTHROID 88 MCG PO TABS: 88.0000 ug | ORAL_TABLET | Freq: Every day | ORAL | 1 refills | Status: DC

## 2022-11-21 NOTE — Progress Notes (Signed)
See labs, lab only visit in 6-week follow-up planned.

## 2022-11-23 ENCOUNTER — Ambulatory Visit (INDEPENDENT_AMBULATORY_CARE_PROVIDER_SITE_OTHER): Payer: Medicare Other | Admitting: Neurology

## 2022-11-23 ENCOUNTER — Encounter: Payer: Self-pay | Admitting: Neurology

## 2022-11-23 VITALS — BP 132/78 | HR 64 | Ht 65.0 in | Wt 170.0 lb

## 2022-11-23 DIAGNOSIS — M5481 Occipital neuralgia: Secondary | ICD-10-CM

## 2022-11-23 DIAGNOSIS — I6523 Occlusion and stenosis of bilateral carotid arteries: Secondary | ICD-10-CM

## 2022-11-23 NOTE — Progress Notes (Signed)
Chief Complaint  Patient presents with   Follow-up    Rm 15. Patient alone. Pt reports pain becoming more noticeable despite the injections and gabapentin. Patient reports more frequent pain in the right ear and some in left but not as much. Patient also reports pain in scalp and hurts to scratch it. Tries to avoid putting stress on that side of head.       ASSESSMENT AND PLAN  Bethany Clarke is a 67 y.o. female   Right occipital area pain,  Happened since she fell and landed on her right occipital region  Still have significant tenderness,  Suggest as needed NSAIDs, hot compression, neck stretching exercise, she does not want to have repeat injection,   DIAGNOSTIC DATA (LABS, IMAGING, TESTING) - I reviewed patient records, labs, notes, testing and imaging myself where available.  Lab in 2023: CMP.   MEDICAL HISTORY:  Bethany Clarke is a 67 year old female, seen in request by Dr. Meredith Staggers R, for evaluation of right occipital area pain, initial evaluation October 04, 2022  I reviewed and summarized the referring note. PMHX.  HLD Depression anxiety Hypothyroidism Left subclavian artery stenosis, s/p stent, presenting with left arm claudication in 2016 Used to smoke 1ppd, quit 2015. Carotid artery stenosis  She fell on September 08 2022, fell backwards landed on her right occipital region, no loss of consciousness, was able to get up, but a week later, she noticed gradual onset building up pain at the right occipital region, radiating to right parietal right ear, at its worst she felt like a pencil stick inside her right ear, variable degree, sometimes worse than the other, difficulty sleep, tenderness of right nuchal area, sensitivity of adjacent right scalp,  She was initially evaluated by a local urgent care, worried about the possibility of shingles, was treated with Valtrex 1000 mg 3 times a day along with steroid, did not help her symptoms, actually right  occipital area so painful, she went to emergency room September 28, 2022, Toradol, oxycodone as needed did not help her pain  She denies radiating pain to right shoulder, denies left arm involvement denies bowel bladder incontinence, denies gait abnormality  She previously was a heavy smoker, had a history of peripheral vascular disease, near occlusion left subclavian artery severe stenosis, presenting with left arm vascular claudication requiring stent treatment in 2016, which did help her symptoms, on Plavix 75 mg daily  I personally reviewed CT angiogram of head and neck on September 29, 2022 1. Patent left subclavian artery stent without evidence for residual or recurrent stenosis. 2. Atherosclerotic changes at the proximal left internal carotid artery with a 60-70% stenosis. 3. Proximal right internal carotid artery without significant stenosis relative to the more distal vessels. 4. Mild atherosclerotic calcifications within the cavernous internal carotid arteries bilaterally without significant stenosis. 5. Fetal type posterior cerebral arteries bilaterally without significant proximal stenosis, aneurysm, or branch vessel occlusion within the Circle of Willis. 6. Aortic Atherosclerosis (ICD10-I70.0) and Emphysema (ICD10-J43.9).  UPDATE Nov 23 2022: She had right occipital area trigger point injection/nerve block on October 04, 2022, the area was numb for couple days, later continued to experience recurrent pain, but much lesser area, focus on the right nuchal area, radiating towards the right ear, very rarely similar involvement of the left side,  She has been taking gabapentin 300 mg 2 tablets 3 times a day, complains of dizziness, denies radiating pain to bilateral shoulder upper extremity, no bowel or bladder incontinence, also have Celebrex  prescription,  But she is very bothered by her constant right occipital/upper nuchal area pain, despite overall 40% improvement  Again we  personally reviewed CT cervical spine that was done on September 28, 2022 for angiogram, no broken bone, mild multilevel degenerative changes, atherosclerotic change, left internal carotid artery 60 to 70% stenosis,  Significant tenderness of right nuchal area on deep palpitation PHYSICAL EXAM:   Vitals:   11/23/22 1317  BP: 132/78  Pulse: 64  Weight: 170 lb (77.1 kg)  Height: 5\' 5"  (1.651 m)   Not recorded     Body mass index is 28.29 kg/m.  PHYSICAL EXAMNIATION:  Gen: NAD, conversant, well nourised, well groomed                     Cardiovascular: Regular rate rhythm, no peripheral edema, warm, nontender. Eyes: Conjunctivae clear without exudates or hemorrhage Neck: Supple, no carotid bruits. Pulmonary: Clear to auscultation bilaterally   NEUROLOGICAL EXAM:  MENTAL STATUS: Speech/cognition: Awake, alert, oriented to history taking and casual conversation CRANIAL NERVES: CN II: Visual fields are full to confrontation. Pupils are round equal and briskly reactive to light. CN III, IV, VI: extraocular movement are normal. No ptosis. CN V: Facial sensation is intact to light touch CN VII: Face is symmetric with normal eye closure  CN VIII: Hearing is normal to causal conversation. CN IX, X: Phonation is normal. CN XI: Head turning and shoulder shrug are intact  MOTOR: Significant tenderness along the right nuchal line, normal motor strengths  REFLEXES: Reflexes are 2 and symmetric at the biceps, triceps, knees, and ankles. Plantar responses are flexor.  SENSORY: Intact to light touch, pinprick and vibratory sensation are intact in fingers and toes.  COORDINATION: There is no trunk or limb dysmetria noted.  GAIT/STANCE: Posture is normal. Gait is steady   REVIEW OF SYSTEMS:  Full 14 system review of systems performed and notable only for as above All other review of systems were negative.   ALLERGIES: No Known Allergies  HOME MEDICATIONS: Current Outpatient  Medications  Medication Sig Dispense Refill   albuterol (VENTOLIN HFA) 108 (90 Base) MCG/ACT inhaler INHALE 1-2 PUFFS INTO THE LUNGS EVERY 4 (FOUR) HOURS AS NEEDED FOR WHEEZING OR SHORTNESS OF BREATH. 18 g 2   atorvastatin (LIPITOR) 40 MG tablet Take 1 tablet (40 mg total) by mouth daily. 90 tablet 2   clopidogrel (PLAVIX) 75 MG tablet Take 1 tablet (75 mg total) by mouth daily. 90 tablet 2   FLUoxetine (PROZAC) 40 MG capsule Take 1 capsule (40 mg total) by mouth daily. 90 capsule 3   gabapentin (NEURONTIN) 300 MG capsule Take 2-3 capsules (600-900 mg total) by mouth 3 (three) times daily. 210 capsule 3   SYNTHROID 88 MCG tablet Take 1 tablet (88 mcg total) by mouth daily before breakfast. 90 tablet 1   clonazePAM (KLONOPIN) 0.5 MG tablet Take 1 tablet (0.5 mg total) by mouth 2 (two) times daily as needed for anxiety. (Patient not taking: Reported on 11/15/2022) 20 tablet 1   HYDROcodone-acetaminophen (NORCO/VICODIN) 5-325 MG tablet Take 1 tablet by mouth every 6 (six) hours as needed for moderate pain. (Patient not taking: Reported on 11/15/2022) 20 tablet 0   Current Facility-Administered Medications  Medication Dose Route Frequency Provider Last Rate Last Admin   ipratropium (ATROVENT) nebulizer solution 0.5 mg  0.5 mg Nebulization Once Wendie Agreste, MD        PAST MEDICAL HISTORY: Past Medical History:  Diagnosis Date  Atherosclerosis 11/14/2005   carotid US - mild calcific non-stenotic plaque bilaterally   Carotid bruit 02/05/2009   Doppler - R/L ICAs 0-49% diameter reductin (velocities suggest low-mid range); L subclavian 0-49%  diameter reduction   Chronic bronchitis (HCC)    "growing up; not anymore" (03/14/2013)   Colon polyp    hyperplastic   Costochondritis    Dizziness    Emphysema of lung (HCC)    GERD (gastroesophageal reflux disease)    H. pylori infection    Heart burn    Hemorrhoids    Hypothyroid    Lipidemia    Pneumonia ~ 2009   Subclavian artery stenosis,  left (HCC)    subclavian steal physiology status post PTA and stenting   Syncope, near     PAST SURGICAL HISTORY: Past Surgical History:  Procedure Laterality Date   ABDOMINAL HYSTERECTOMY  1999   BILATERAL UPPER EXTREMITY ANGIOGRAM N/A 03/14/2013   Procedure: BILATERAL UPPER EXTREMITY ANGIOGRAM;  Surgeon: Lorretta Harp, MD;  Location: [facility] CATH LAB;  Service: Cardiovascular;  Laterality: N/A;   BREAST BIOPSY Right 1990's   "benign" (03/14/2013)   CARDIAC CATHETERIZATION  03/03/2013   DILATION AND CURETTAGE OF UTERUS  1980's   KNEE ARTHROSCOPY WITH MENISCAL REPAIR Left 07/21/2021   Procedure: Arthroscopic debridement, arthroscopic debridement of bucket-handle component of lateral meniscus tear, and arthroscopically-assisted repair of lateral meniscus root tear, left knee. ;  Surgeon: Corky Mull, MD;  Location: [facility] ORS;  Service: Orthopedics;  Laterality: Left;   LEFT HEART CATHETERIZATION WITH CORONARY ANGIOGRAM Bilateral 03/03/2013   Procedure: LEFT HEART CATHETERIZATION WITH CORONARY ANGIOGRAM;  Surgeon: Troy Sine, MD;  Location: [facility] CATH LAB;  Service: Cardiovascular;  Laterality: Bilateral;   PERCUTANEOUS STENT INTERVENTION  03/14/2013   Procedure: PERCUTANEOUS STENT INTERVENTION;  Surgeon: Lorretta Harp, MD;  Location: [facility] CATH LAB;  Service: Cardiovascular;;   SUBCLAVIAN ARTERY STENT Left 03/14/2013    for subclavian steal/claudication (03/14/2013)   TUBAL LIGATION  1980's    FAMILY HISTORY: Family History  Problem Relation Age of Onset   Cancer Brother        spine/lung   COPD Brother        smoked   Hypertension Brother    Rheum arthritis Brother    Hyperlipidemia Brother    Hypertension Brother    Diabetes Mother    Heart disease Mother    Stroke Mother    Cancer Father        cirrohosis   Hypertension Father    Heart disease Sister    Diabetes Sister    Hyperlipidemia Sister    Hypertension Sister    COPD Sister    Arthritis Sister        rheumatoid    Diabetes Sister    Arthritis Sister        rheumatoid   Colon cancer Neg Hx     SOCIAL HISTORY: Social History   Socioeconomic History   Marital status: Married    Spouse name: Eddie Dibbles   Number of children: 3   Years of education: Not on file   Highest education level: Some college, no degree  Occupational History   Occupation: Insurance claims handler: [employer]  Tobacco Use   Smoking status: Former    Packs/day: 0.50    Years: 40.00    Total pack years: 20.00    Types: Cigarettes    Quit date: 02/11/2013    Years since quitting: 9.7   Smokeless tobacco:  Never  Substance and Sexual Activity   Alcohol use: No   Drug use: No   Sexual activity: Yes    Comment: number of sex partners in the last 12 months  1  Other Topics Concern   Not on file  Social History Narrative   Daily caffeine. Married. Education: [employer]. Exercise: No.      Patient is right-handed. She lives with her husband in a two level home. She drinks 2 cups of coffee a day. 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   Social Determinants of Health   Financial Resource Strain: Low Risk  (08/08/2022)   Overall Financial Resource Strain (CARDIA)    Difficulty of Paying Living Expenses: Not hard at all  Food Insecurity: No Food Insecurity (08/08/2022)   Hunger Vital Sign    Worried About Running Out of Food in the Last Year: Never true    Ran Out of Food in the Last Year: Never true  Transportation Needs: No Transportation Needs (08/08/2022)   PRAPARE - Hydrologist (Medical): No    Lack of Transportation (Non-Medical): No  Physical Activity: Sufficiently Active (08/08/2022)   Exercise Vital Sign    Days of Exercise per Week: 7 days    Minutes of Exercise per Session: 30 min  Stress: No Stress Concern Present (08/08/2022)   [place]    Feeling of Stress : Not at all  Social Connections:  Moderately Integrated (08/08/2022)   Social Connection and Isolation Panel [NHANES]    Frequency of Communication with Friends and Family: More than three times a week    Frequency of Social Gatherings with Friends and Family: More than three times a week    Attends Religious Services: Never    Marine scientist or Organizations: Yes    Attends Music therapist: More than 4 times per year    Marital Status: Married  Human resources officer Violence: Not At Risk (08/08/2022)   Humiliation, Afraid, Rape, and Kick questionnaire    Fear of Current or Ex-Partner: No    Emotionally Abused: No    Physically Abused: No    Sexually Abused: No      Marcial Pacas, M.D. Ph.D.  [facility] [address] [place], [place] [address] Ph: [phone] Fax: [phone]  CC:  Wendie Agreste, MD [address] [place],  [place] [address]  Wendie Agreste, MD

## 2022-11-24 ENCOUNTER — Ambulatory Visit: Payer: Medicare Other | Attending: Nurse Practitioner | Admitting: Nurse Practitioner

## 2022-11-24 ENCOUNTER — Encounter: Payer: Self-pay | Admitting: Nurse Practitioner

## 2022-11-24 VITALS — BP 131/79 | HR 76 | Ht 65.0 in | Wt 171.2 lb

## 2022-11-24 DIAGNOSIS — I6523 Occlusion and stenosis of bilateral carotid arteries: Secondary | ICD-10-CM | POA: Insufficient documentation

## 2022-11-24 DIAGNOSIS — I771 Stricture of artery: Secondary | ICD-10-CM | POA: Diagnosis present

## 2022-11-24 DIAGNOSIS — E039 Hypothyroidism, unspecified: Secondary | ICD-10-CM | POA: Diagnosis not present

## 2022-11-24 DIAGNOSIS — R0602 Shortness of breath: Secondary | ICD-10-CM | POA: Diagnosis present

## 2022-11-24 DIAGNOSIS — E785 Hyperlipidemia, unspecified: Secondary | ICD-10-CM | POA: Diagnosis present

## 2022-11-24 NOTE — Progress Notes (Unsigned)
Office Visit    Patient Name: Bethany Clarke Date of Encounter: 11/24/2022  Primary Care Provider:  Wendie Agreste, MD Primary Cardiologist:  Shelva Majestic, MD  Chief Complaint    67 year old female with a history of carotid artery stenosis, subclavian steal s/p left subclavian stenting, hyperlipidemia, hypothyroidism, emphysema, and GERD who presents for follow-up related to carotid/subclavian artery stenosis.  Past Medical History    Past Medical History:  Diagnosis Date   Atherosclerosis 11/14/2005   carotid US - mild calcific non-stenotic plaque bilaterally   Carotid bruit 02/05/2009   Doppler - R/L ICAs 0-49% diameter reductin (velocities suggest low-mid range); L subclavian 0-49%  diameter reduction   Chronic bronchitis (HCC)    "growing up; not anymore" (03/14/2013)   Colon polyp    hyperplastic   Costochondritis    Dizziness    Emphysema of lung (HCC)    GERD (gastroesophageal reflux disease)    H. pylori infection    Heart burn    Hemorrhoids    Hypothyroid    Lipidemia    Pneumonia ~ 2009   Subclavian artery stenosis, left (HCC)    subclavian steal physiology status post PTA and stenting   Syncope, near    Past Surgical History:  Procedure Laterality Date   ABDOMINAL HYSTERECTOMY  1999   BILATERAL UPPER EXTREMITY ANGIOGRAM N/A 03/14/2013   Procedure: BILATERAL UPPER EXTREMITY ANGIOGRAM;  Surgeon: Lorretta Harp, MD;  Location: [facility] CATH LAB;  Service: Cardiovascular;  Laterality: N/A;   BREAST BIOPSY Right 1990's   "benign" (03/14/2013)   CARDIAC CATHETERIZATION  03/03/2013   DILATION AND CURETTAGE OF UTERUS  1980's   KNEE ARTHROSCOPY WITH MENISCAL REPAIR Left 07/21/2021   Procedure: Arthroscopic debridement, arthroscopic debridement of bucket-handle component of lateral meniscus tear, and arthroscopically-assisted repair of lateral meniscus root tear, left knee. ;  Surgeon: Corky Mull, MD;  Location: [facility] ORS;  Service: Orthopedics;  Laterality: Left;    LEFT HEART CATHETERIZATION WITH CORONARY ANGIOGRAM Bilateral 03/03/2013   Procedure: LEFT HEART CATHETERIZATION WITH CORONARY ANGIOGRAM;  Surgeon: Troy Sine, MD;  Location: [facility] CATH LAB;  Service: Cardiovascular;  Laterality: Bilateral;   PERCUTANEOUS STENT INTERVENTION  03/14/2013   Procedure: PERCUTANEOUS STENT INTERVENTION;  Surgeon: Lorretta Harp, MD;  Location: [facility] CATH LAB;  Service: Cardiovascular;;   SUBCLAVIAN ARTERY STENT Left 03/14/2013    for subclavian steal/claudication (03/14/2013)   TUBAL LIGATION  1980's    Allergies  No Known Allergies   Labs/Other Studies Reviewed    The following studies were reviewed today: Stress test 2018: Nuclear stress EF: 54%. Dyskinetic contraction Downsloping ST segment depression ST segment depression of 2 mm was noted during stress in the II, III, aVF, V2, V3, V4 and V5 leads. Defect 1: There is a small defect of mild severity present in the apical inferior location. Small apical infarct pattern possible. This is a low risk perfusion study, however ECG is abnormal demonstrating ST segment depression, downsloping diffusely. Normal TID 1.06. If symptoms become more worrisome, consider further cardiac testing.   Candee Furbish, MD  Echo 01/2021: IMPRESSIONS   1. Left ventricular ejection fraction by 3D volume is 56 %. The left  ventricle has normal function. The left ventricle has no regional wall  motion abnormalities. Left ventricular diastolic parameters were normal.   2. Right ventricular systolic function is normal. The right ventricular  size is normal. There is normal pulmonary artery systolic pressure. The  estimated right ventricular systolic pressure is 0000000 mmHg.  3. The mitral valve is normal in structure. Trivial mitral valve  regurgitation. No evidence of mitral stenosis.   4. The aortic valve is normal in structure. There is mild calcification  of the aortic valve. Aortic valve regurgitation is trivial. No aortic   stenosis is present.   5. The inferior vena cava is normal in size with greater than 50%  respiratory variability, suggesting right atrial pressure of 3 mmHg.   CTA head/neck 09/2022: IMPRESSION: 1. Patent left subclavian artery stent without evidence for residual or recurrent stenosis. 2. Atherosclerotic changes at the proximal left internal carotid artery with a 60-70% stenosis. 3. Proximal right internal carotid artery without significant stenosis relative to the more distal vessels. 4. Mild atherosclerotic calcifications within the cavernous internal carotid arteries bilaterally without significant stenosis. 5. Fetal type posterior cerebral arteries bilaterally without significant proximal stenosis, aneurysm, or branch vessel occlusion within the Circle of Willis. 6. Aortic Atherosclerosis (ICD10-I70.0) and Emphysema (ICD10-J43.9).   Carotid ultrasound 10/2022:  Summary:  Right Carotid: Velocities in the right ICA are consistent with a 1-39%  stenosis.   Left Carotid: Velocities in the left ICA are consistent with a 40-59%  stenosis.   Vertebrals: Bilateral vertebral arteries demonstrate antegrade flow.  Subclavians: Normal flow hemodynamics were seen in bilateral subclavian  arteries.   *See table(s) above for measurements and observations.  Suggest follow up study in 12 months.   Recent Labs: 09/28/2022: Hemoglobin 13.5; Platelets 266 11/15/2022: ALT 33; BUN 12; Creatinine, Ser 0.69; Potassium 4.6; Sodium 137; TSH 0.06  Recent Lipid Panel    Component Value Date/Time   CHOL 137 11/15/2022 0932   CHOL 166 12/23/2020 1127   TRIG 71.0 11/15/2022 0932   HDL 58.00 11/15/2022 0932   HDL 69 12/23/2020 1127   CHOLHDL 2 11/15/2022 0932   VLDL 14.2 11/15/2022 0932   LDLCALC 65 11/15/2022 0932   LDLCALC 73 12/23/2020 1127    History of Present Illness    67 year old female with the above past medical history including carotid artery stenosis, subclavian steal s/p left  subclavian stenting, hyperlipidemia, hypothyroidism, emphysema, and GERD.  Prior stress test in 2018 was negative for ischemia. She was last seen in our office on 01/07/2021 and was stable from a cardiac standpoint.  She did note some mild dyspnea on exertion.  Echocardiogram was essentially normal.  She presented to the ED in 09/2022 in setting of occipital headache.  CTA of the head and neck as a part of ED workup showed patent left subclavian artery stent, 60-70% left internal carotid artery stenosis. Most recent carotid Dopplers dated 10/2022 showed 1 to 39% R ICA stenosis, 40 to [id number] LICA stenosis, normal flow in bilateral subclavian arteries.  Follow-up study was recommended in 1 year.   She presents today for follow-up.  Since her last visit she has been stable from a cardiac standpoint.  She denies any chest pain, dyspnea, dizziness, presyncope, syncope.  She states that the primary reason for her visit today is was to discuss the difference in the results of her carotid Dopplers and CT head/neck and that she would like to discuss this with Dr. Gwenlyn Found or Dr. Claiborne Billings.  Otherwise, she reports feeling well.  Home Medications    Current Outpatient Medications  Medication Sig Dispense Refill   albuterol (VENTOLIN HFA) 108 (90 Base) MCG/ACT inhaler INHALE 1-2 PUFFS INTO THE LUNGS EVERY 4 (FOUR) HOURS AS NEEDED FOR WHEEZING OR SHORTNESS OF BREATH. 18 g 2   atorvastatin (LIPITOR) 40 MG tablet  Take 1 tablet (40 mg total) by mouth daily. 90 tablet 2   clonazePAM (KLONOPIN) 0.5 MG tablet Take 1 tablet (0.5 mg total) by mouth 2 (two) times daily as needed for anxiety. 20 tablet 1   clopidogrel (PLAVIX) 75 MG tablet Take 1 tablet (75 mg total) by mouth daily. 90 tablet 2   FLUoxetine (PROZAC) 40 MG capsule Take 1 capsule (40 mg total) by mouth daily. 90 capsule 3   gabapentin (NEURONTIN) 300 MG capsule Take 2-3 capsules (600-900 mg total) by mouth 3 (three) times daily. 210 capsule 3   HYDROcodone-acetaminophen  (NORCO/VICODIN) 5-325 MG tablet Take 1 tablet by mouth every 6 (six) hours as needed for moderate pain. 20 tablet 0   SYNTHROID 88 MCG tablet Take 1 tablet (88 mcg total) by mouth daily before breakfast. 90 tablet 1   Current Facility-Administered Medications  Medication Dose Route Frequency Provider Last Rate Last Admin   ipratropium (ATROVENT) nebulizer solution 0.5 mg  0.5 mg Nebulization Once Wendie Agreste, MD         Review of Systems   She denies chest pain, palpitations, dyspnea, pnd, orthopnea, n, v, dizziness, syncope, edema, weight gain, or early satiety. All other systems reviewed and are otherwise negative except as noted above.   Physical Exam    VS:  BP 131/79   Pulse 76   Ht 5' 5"$  (1.651 m)   Wt 171 lb 3.2 oz (77.7 kg)   SpO2 93%   BMI 28.49 kg/m   GEN: Well nourished, well developed, in no acute distress. HEENT: normal. Neck: Supple, no JVD, L carotid bruit, no masses. Cardiac: RRR, no murmurs, rubs, or gallops. No clubbing, cyanosis, edema.  Radials/DP/PT 2+ and equal bilaterally.  Respiratory:  Respirations regular and unlabored, clear to auscultation bilaterally. GI: Soft, nontender, nondistended, BS + x 4. MS: no deformity or atrophy. Skin: warm and dry, no rash. Neuro:  Strength and sensation are intact. Psych: Normal affect.  Accessory Clinical Findings    ECG personally reviewed by me today -NSR, 76 bpm- no acute changes.   Lab Results  Component Value Date   WBC 6.3 09/28/2022   HGB 13.5 09/28/2022   HCT 41.9 09/28/2022   MCV 101.9 (H) 09/28/2022   PLT 266 09/28/2022   Lab Results  Component Value Date   CREATININE 0.69 11/15/2022   BUN 12 11/15/2022   NA 137 11/15/2022   K 4.6 11/15/2022   CL 98 11/15/2022   CO2 32 11/15/2022   Lab Results  Component Value Date   ALT 33 11/15/2022   AST 24 11/15/2022   ALKPHOS 92 11/15/2022   BILITOT 0.3 11/15/2022   Lab Results  Component Value Date   CHOL 137 11/15/2022   HDL 58.00  11/15/2022   LDLCALC 65 11/15/2022   TRIG 71.0 11/15/2022   CHOLHDL 2 11/15/2022    Lab Results  Component Value Date   HGBA1C 5.4 06/24/2013    Assessment & Plan   1. Carotid artery/subclavian artery stenosis: CTA of the head/neck as a part of ED workup in 09/2022 showed patent left subclavian artery stent, 60-70% left internal carotid artery stenosis. Most recent carotid dopplers in 10/2022 showed 1 to 39% R ICA stenosis, 40 to [id number] LICA stenosis, normal flow in bilateral subclavian arteries. Asymptomatic.  Discussed with Dr. Gwenlyn Found, will plan for CTA head/neck in 10/2023 for continued monitoring (per pt preference). Continue Plavix, Lipitor.  2. Hyperlipidemia: LDL was 65 in 10/2022.  Continue Lipitor.  3.  Hypothyroidism: TSH was 0.6  in 10/2022. Monitored and managed per PCP.   4. Emphysema: Denies worsening dyspnea.  5. Disposition: Follow-up in 1 year (MD only per pt preference).      Lenna Sciara, NP 11/25/2022, 2:03 PM

## 2022-11-24 NOTE — Patient Instructions (Signed)
Medication Instructions:  Your physician recommends that you continue on your current medications as directed. Please refer to the Current Medication list given to you today.  *If you need a refill on your cardiac medications before your next appointment, please call your pharmacy*   Lab Work: NONE ordered at this time of appointment   If you have labs (blood work) drawn today and your tests are completely normal, you will receive your results only by: [place] (if you have MyChart) OR A paper copy in the mail If you have any lab test that is abnormal or we need to change your treatment, we will call you to review the results.   Testing/Procedures: NONE ordered at this time of appointment     Follow-Up: At [facility], you and your health needs are our priority.  As part of our continuing mission to provide you with exceptional heart care, we have created designated Provider Care Teams.  These Care Teams include your primary Cardiologist (physician) and Advanced Practice Providers (APPs -  Physician Assistants and Nurse Practitioners) who all work together to provide you with the care you need, when you need it.  We recommend signing up for the patient portal called "MyChart".  Sign up information is provided on this After Visit Summary.  MyChart is used to connect with patients for Virtual Visits (Telemedicine).  Patients are able to view lab/test results, encounter notes, upcoming appointments, etc.  Non-urgent messages can be sent to your provider as well.   To learn more about what you can do with MyChart, go to [email or web address].    Your next appointment:   1 year(s)  Provider:   Dr. Claiborne Billings     Other Instructions

## 2022-11-25 ENCOUNTER — Encounter: Payer: Self-pay | Admitting: Nurse Practitioner

## 2022-12-26 ENCOUNTER — Ambulatory Visit: Payer: Medicare Other | Admitting: Neurology

## 2023-01-05 ENCOUNTER — Other Ambulatory Visit (INDEPENDENT_AMBULATORY_CARE_PROVIDER_SITE_OTHER): Payer: Medicare Other

## 2023-01-05 DIAGNOSIS — Z131 Encounter for screening for diabetes mellitus: Secondary | ICD-10-CM

## 2023-01-05 DIAGNOSIS — Z0000 Encounter for general adult medical examination without abnormal findings: Secondary | ICD-10-CM

## 2023-01-05 DIAGNOSIS — E785 Hyperlipidemia, unspecified: Secondary | ICD-10-CM | POA: Diagnosis not present

## 2023-01-05 DIAGNOSIS — Z130 Encounter for screening for diseases of the blood and blood-forming organs and certain disorders involving the immune mechanism: Secondary | ICD-10-CM | POA: Diagnosis not present

## 2023-01-05 DIAGNOSIS — E039 Hypothyroidism, unspecified: Secondary | ICD-10-CM | POA: Diagnosis not present

## 2023-01-05 LAB — COMPREHENSIVE METABOLIC PANEL
ALT: 28 U/L (ref 0–35)
AST: 20 U/L (ref 0–37)
Albumin: 4.6 g/dL (ref 3.5–5.2)
Alkaline Phosphatase: 111 U/L (ref 39–117)
BUN: 11 mg/dL (ref 6–23)
CO2: 31 mEq/L (ref 19–32)
Calcium: 10.3 mg/dL (ref 8.4–10.5)
Chloride: 98 mEq/L (ref 96–112)
Creatinine, Ser: 0.68 mg/dL (ref 0.40–1.20)
GFR: 90.73 mL/min (ref >60.00)
Glucose, Bld: 86 mg/dL (ref 70–99)
Potassium: 4.8 mEq/L (ref 3.5–5.1)
Sodium: 137 mEq/L (ref 135–145)
Total Bilirubin: 0.3 mg/dL (ref 0.2–1.2)
Total Protein: 7.8 g/dL (ref 6.0–8.3)

## 2023-01-05 LAB — LIPID PANEL
Cholesterol: 136 mg/dL (ref 0–200)
HDL: 66.9 mg/dL (ref >39.00)
LDL Cholesterol: 55 mg/dL (ref 0–99)
NonHDL: 69.07
Total CHOL/HDL Ratio: 2
Triglycerides: 68 mg/dL (ref 0.0–149.0)
VLDL: 13.6 mg/dL (ref 0.0–40.0)

## 2023-01-05 LAB — TSH: TSH: 0.46 u[IU]/mL (ref 0.35–5.50)

## 2023-02-09 ENCOUNTER — Telehealth: Payer: Self-pay

## 2023-02-09 NOTE — Telephone Encounter (Signed)
Telephoned patient and left voice message with [facility] contact information. ?

## 2023-05-09 ENCOUNTER — Encounter: Payer: Self-pay | Admitting: Family Medicine

## 2023-05-09 DIAGNOSIS — J441 Chronic obstructive pulmonary disease with (acute) exacerbation: Secondary | ICD-10-CM

## 2023-05-09 MED ORDER — ALBUTEROL SULFATE HFA 108 (90 BASE) MCG/ACT IN AERS: INHALATION_SPRAY | RESPIRATORY_TRACT | 2 refills | Status: DC

## 2023-05-18 ENCOUNTER — Encounter: Payer: Self-pay | Admitting: Family Medicine

## 2023-05-18 ENCOUNTER — Ambulatory Visit: Payer: Medicare Other | Admitting: Family Medicine

## 2023-05-18 VITALS — BP 126/70 | HR 84 | Temp 97.7°F | Ht 64.0 in | Wt 181.6 lb

## 2023-05-18 DIAGNOSIS — F418 Other specified anxiety disorders: Secondary | ICD-10-CM

## 2023-05-18 DIAGNOSIS — E039 Hypothyroidism, unspecified: Secondary | ICD-10-CM | POA: Diagnosis not present

## 2023-05-18 DIAGNOSIS — E785 Hyperlipidemia, unspecified: Secondary | ICD-10-CM | POA: Diagnosis not present

## 2023-05-18 DIAGNOSIS — J45909 Unspecified asthma, uncomplicated: Secondary | ICD-10-CM | POA: Diagnosis not present

## 2023-05-18 DIAGNOSIS — G458 Other transient cerebral ischemic attacks and related syndromes: Secondary | ICD-10-CM

## 2023-05-18 DIAGNOSIS — J449 Chronic obstructive pulmonary disease, unspecified: Secondary | ICD-10-CM | POA: Diagnosis not present

## 2023-05-18 DIAGNOSIS — Z1211 Encounter for screening for malignant neoplasm of colon: Secondary | ICD-10-CM

## 2023-05-18 LAB — COMPREHENSIVE METABOLIC PANEL
ALT: 25 U/L (ref 0–35)
AST: 19 U/L (ref 0–37)
Albumin: 4.5 g/dL (ref 3.5–5.2)
Alkaline Phosphatase: 105 U/L (ref 39–117)
BUN: 13 mg/dL (ref 6–23)
CO2: 30 mEq/L (ref 19–32)
Calcium: 10 mg/dL (ref 8.4–10.5)
Chloride: 101 mEq/L (ref 96–112)
Creatinine, Ser: 0.75 mg/dL (ref 0.40–1.20)
GFR: 82.72 mL/min (ref >60.00)
Glucose, Bld: 79 mg/dL (ref 70–99)
Potassium: 4.6 mEq/L (ref 3.5–5.1)
Sodium: 137 mEq/L (ref 135–145)
Total Bilirubin: 0.4 mg/dL (ref 0.2–1.2)
Total Protein: 7.7 g/dL (ref 6.0–8.3)

## 2023-05-18 LAB — LIPID PANEL
Cholesterol: 133 mg/dL (ref 0–200)
HDL: 58.3 mg/dL (ref >39.00)
LDL Cholesterol: 59 mg/dL (ref 0–99)
NonHDL: 74.99
Total CHOL/HDL Ratio: 2
Triglycerides: 80 mg/dL (ref 0.0–149.0)
VLDL: 16 mg/dL (ref 0.0–40.0)

## 2023-05-18 LAB — TSH: TSH: 1.05 u[IU]/mL (ref 0.35–5.50)

## 2023-05-18 MED ORDER — ATORVASTATIN CALCIUM 40 MG PO TABS: 40.0000 mg | ORAL_TABLET | Freq: Every day | ORAL | 2 refills | Status: DC

## 2023-05-18 MED ORDER — FLUOXETINE HCL 40 MG PO CAPS: 40.0000 mg | ORAL_CAPSULE | Freq: Every day | ORAL | 3 refills | Status: DC

## 2023-05-18 MED ORDER — CLOPIDOGREL BISULFATE 75 MG PO TABS: 75.0000 mg | ORAL_TABLET | Freq: Every day | ORAL | 2 refills | Status: DC

## 2023-05-18 MED ORDER — BREZTRI AEROSPHERE 160-9-4.8 MCG/ACT IN AERO: 2.0000 | INHALATION_SPRAY | Freq: Two times a day (BID) | RESPIRATORY_TRACT | 11 refills | Status: DC

## 2023-05-18 MED ORDER — SYNTHROID 88 MCG PO TABS: 88.0000 ug | ORAL_TABLET | Freq: Every day | ORAL | 1 refills | Status: DC

## 2023-05-18 NOTE — Progress Notes (Signed)
Subjective:  Patient ID: Bethany Clarke, female    DOB: [birth year]  Age: 67 y.o. MRN: [id number]  CC:  Chief Complaint  Patient presents with   Follow-up    Chronic conditions.    HPI Bethany Clarke presents for follow-up.  Neck pain, cervical occipital neuralgia Neuro, Dr. Salome Arnt occipital neuralgia, treated with injections previously, gabapentin and symptomatic care.  Last visit in February.  Gabapentin 300 mg, #210 last filled on 02/15/2023, previously 11/15/2022. Able to taper off - pain improved. Minimal soreness at  end of day at times, not needing meds.   Cardiac Cardiology, Dr. Allyson Sabal, bilateral carotid artery stenosis, subclavian artery stenosis, with prior stenting.  Cardiology visit in February.  Dopplers in January, 1 to 39% right ICA stenosis 40 to 59% left ICA stenosis, normal flow in bilateral subclavians.  Asymptomatic, plan for CTA head neck in January 2025 for continued monitoring and continued on Plavix, Lipitor. No new bleeding or side effects with meds.  Lab Results  Component Value Date   CHOL 136 01/05/2023   HDL 66.90 01/05/2023   LDLCALC 55 01/05/2023   TRIG 68.0 01/05/2023   CHOLHDL 2 01/05/2023   Hypothyroidism: Lab Results  Component Value Date   TSH 0.46 01/05/2023  Synthroid 88 mcg daily  - lowered from 6 months ago with tsh 0.06. repeat as above.  Taking medication daily.  No new hot or cold intolerance. No new hair or skin changes, heart palpitations or new fatigue - some fatigue with heat.  No new weight changes.   COPD with asthma See prior notes, possible complex or combined asthma and COPD.  Some reversibility with albuterol previously.  Pulmonary follow-up has been discussed previously but declined.  Has used albuterol - using once per day. Some dyspnea at times - no new fever. Some increased cough at night - dry cough, no fever. Agrees to try other med.   Depression with anxiety Fluoxetine 40 mg daily.  Klonopin has been  used for breakthrough anxiety symptoms in the past, infrequent need - not needed recently.  Controlled substance database reviewed, last filled #20 on 08/06/2022. intermittent therapist meetings previously. Last met with therapist 2 weeks ago - went well. Feels overall same - declines med adjustments.       05/18/2023    8:08 AM 11/15/2022    8:27 AM 09/27/2022    1:36 PM 08/08/2022   11:21 AM 05/12/2022    9:13 AM  Depression screen PHQ 2/9  Decreased Interest 1 2 1  0 2  Down, Depressed, Hopeless 1 1 1  0 2  PHQ - 2 Score 2 3 2  0 4  Altered sleeping 2 1 2  2   Tired, decreased energy 2 1 1  2   Change in appetite 2 2 0  3  Feeling bad or failure about yourself  2 1 0  2  Trouble concentrating 2 1 1   0  Moving slowly or fidgety/restless 1 1 0  0  Suicidal thoughts 0 0 0  0  PHQ-9 Score 13 10 6  13   Difficult doing work/chores Not difficult at all          05/18/2023    8:08 AM  GAD 7 : Generalized Anxiety Score  Nervous, Anxious, on Edge 1  Control/stop worrying 1  Worry too much - different things 1  Trouble relaxing 1  Restless 1  Easily annoyed or irritable 1  Afraid - awful might happen 0  Total GAD 7 Score  6                05/18/2023    8:08 AM 11/15/2022    8:27 AM 09/27/2022    1:36 PM 08/08/2022   11:21 AM 05/12/2022    9:13 AM  Depression screen PHQ 2/9  Decreased Interest 1 2 1  0 2  Down, Depressed, Hopeless 1 1 1  0 2  PHQ - 2 Score 2 3 2  0 4  Altered sleeping 2 1 2  2   Tired, decreased energy 2 1 1  2   Change in appetite 2 2 0  3  Feeling bad or failure about yourself  2 1 0  2  Trouble concentrating 2 1 1   0  Moving slowly or fidgety/restless 1 1 0  0  Suicidal thoughts 0 0 0  0  PHQ-9 Score 13 10 6  13   Difficult doing work/chores Not difficult at all       Agrees to referral for GI - colonoscopy.  Declined shingles vaccine, declined referral for mammogram. History Patient Active Problem List   Diagnosis Date Noted   Neck pain on right side  10/04/2022   Cervico-occipital neuralgia of right side 10/04/2022   Chest pain, atypical 03/17/2016   Paresthesia 07/14/2013   Bilateral foot pain 05/01/2013   Subclavian artery stenosis, left (HCC)    Subclavian steal syndrome 03/16/2013   Subclavian arterial stenosis (HCC) 03/11/2013   Carotid stenosis 03/11/2013   Hyperlipidemia with target LDL less than 70 03/11/2013   GERD (gastroesophageal reflux disease)    Hypothyroid    Dizziness    Syncope, near    Heart burn    Hypothyroidism 05/28/2007   COPD GOLD II with reversibility  05/28/2007   Past Medical History:  Diagnosis Date   Atherosclerosis 11/14/2005   carotid US - mild calcific non-stenotic plaque bilaterally   Carotid bruit 02/05/2009   Doppler - R/L ICAs 0-49% diameter reductin (velocities suggest low-mid range); L subclavian 0-49%  diameter reduction   Chronic bronchitis (HCC)    "growing up; not anymore" (03/14/2013)   Colon polyp    hyperplastic   Costochondritis    Dizziness    Emphysema of lung (HCC)    GERD (gastroesophageal reflux disease)    H. pylori infection    Heart burn    Hemorrhoids    Hypothyroid    Lipidemia    Pneumonia ~ 2009   Subclavian artery stenosis, left (HCC)    subclavian steal physiology status post PTA and stenting   Syncope, near    Past Surgical History:  Procedure Laterality Date   ABDOMINAL HYSTERECTOMY  1999   BILATERAL UPPER EXTREMITY ANGIOGRAM N/A 03/14/2013   Procedure: BILATERAL UPPER EXTREMITY ANGIOGRAM;  Surgeon: [name], MD;  Location: [facility] CATH LAB;  Service: Cardiovascular;  Laterality: N/A;   BREAST BIOPSY Right 1990's   "benign" (03/14/2013)   CARDIAC CATHETERIZATION  03/03/2013   DILATION AND CURETTAGE OF UTERUS  1980's   KNEE ARTHROSCOPY WITH MENISCAL REPAIR Left 07/21/2021   Procedure: Arthroscopic debridement, arthroscopic debridement of bucket-handle component of lateral meniscus tear, and arthroscopically-assisted repair of lateral meniscus root tear,  left knee. ;  Surgeon: Christena Flake, MD;  Location: [facility] ORS;  Service: Orthopedics;  Laterality: Left;   LEFT HEART CATHETERIZATION WITH CORONARY ANGIOGRAM Bilateral 03/03/2013   Procedure: LEFT HEART CATHETERIZATION WITH CORONARY ANGIOGRAM;  Surgeon: Lennette Bihari, MD;  Location: [facility] CATH LAB;  Service: Cardiovascular;  Laterality: Bilateral;   PERCUTANEOUS STENT INTERVENTION  03/14/2013   Procedure: PERCUTANEOUS STENT INTERVENTION;  Surgeon: [name], MD;  Location: [facility] CATH LAB;  Service: Cardiovascular;;   SUBCLAVIAN ARTERY STENT Left 03/14/2013    for subclavian steal/claudication (03/14/2013)   TUBAL LIGATION  1980's   No Known Allergies Prior to Admission medications   Medication Sig Start Date End Date Taking? Authorizing Provider  albuterol (VENTOLIN HFA) 108 (90 Base) MCG/ACT inhaler INHALE 1-2 PUFFS INTO THE LUNGS EVERY 4 (FOUR) HOURS AS NEEDED FOR WHEEZING OR SHORTNESS OF BREATH. 05/09/23  Yes [name], MD  aspirin EC 81 MG tablet Take 81 mg by mouth daily. Swallow whole.   Yes [provider]  atorvastatin (LIPITOR) 40 MG tablet Take 1 tablet (40 mg total) by mouth daily. 11/15/22  Yes [name], MD  clopidogrel (PLAVIX) 75 MG tablet Take 1 tablet (75 mg total) by mouth daily. 11/15/22  Yes [name], MD  FLUoxetine (PROZAC) 40 MG capsule Take 1 capsule (40 mg total) by mouth daily. 11/15/22  Yes [name], MD  SYNTHROID 88 MCG tablet Take 1 tablet (88 mcg total) by mouth daily before breakfast. 11/21/22  Yes [name], MD   Social History   Socioeconomic History   Marital status: Married    Spouse name: Renae Fickle   Number of children: 3   Years of education: Not on file   Highest education level: Some college, no degree  Occupational History   Occupation: Orthoptist: [employer]  Tobacco Use   Smoking status: Former    Current packs/day: 0.00    Average packs/day: 0.5 packs/day for 40.0 years (20.0 ttl  pk-yrs)    Types: Cigarettes    Start date: 02/11/1973    Quit date: 02/11/2013    Years since quitting: 10.2   Smokeless tobacco: Never  Substance and Sexual Activity   Alcohol use: No   Drug use: No   Sexual activity: Yes    Comment: number of sex partners in the last 12 months  1  Other Topics Concern   Not on file  Social History Narrative   Daily caffeine. Married. Education: [employer]. Exercise: No.      Patient is right-handed. She lives with her husband in a two level home. She drinks 2 cups of coffee a day. 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   Social Determinants of Health   Financial Resource Strain: Low Risk  (08/08/2022)   Overall Financial Resource Strain (CARDIA)    Difficulty of Paying Living Expenses: Not hard at all  Food Insecurity: No Food Insecurity (08/08/2022)   Hunger Vital Sign    Worried About Running Out of Food in the Last Year: Never true    Ran Out of Food in the Last Year: Never true  Transportation Needs: No Transportation Needs (08/08/2022)   PRAPARE - Administrator, Civil Service (Medical): No    Lack of Transportation (Non-Medical): No  Physical Activity: Sufficiently Active (08/08/2022)   Exercise Vital Sign    Days of Exercise per Week: 7 days    Minutes of Exercise per Session: 30 min  Stress: No Stress Concern Present (08/08/2022)   [employer] of Occupational Health - Occupational Stress Questionnaire    Feeling of Stress : Not at all  Social Connections: Moderately Integrated (08/08/2022)   Social Connection and Isolation Panel [NHANES]    Frequency of Communication with Friends and Family: More than three times  a week    Frequency of Social Gatherings with Friends and Family: More than three times a week    Attends Religious Services: Never    Database administrator or Organizations: Yes    Attends Engineer, structural: More than 4 times per year    Marital Status: Married  Catering manager  Violence: Not At Risk (08/08/2022)   Humiliation, Afraid, Rape, and Kick questionnaire    Fear of Current or Ex-Partner: No    Emotionally Abused: No    Physically Abused: No    Sexually Abused: No    Review of Systems  Per HPI Objective:   Vitals:   05/18/23 0806  BP: 126/70  Pulse: 84  Temp: 97.7 F (36.5 C)  TempSrc: Temporal  SpO2: 95%  Weight: 181 lb 9.6 oz (82.4 kg)  Height: 5\' 4"  (1.626 m)     Physical Exam Vitals reviewed.  Constitutional:      Appearance: Normal appearance. She is well-developed.  HENT:     Head: Normocephalic and atraumatic.  Eyes:     Conjunctiva/sclera: Conjunctivae normal.     Pupils: Pupils are equal, round, and reactive to light.  Neck:     Vascular: No carotid bruit.  Cardiovascular:     Rate and Rhythm: Normal rate and regular rhythm.     Heart sounds: Normal heart sounds.  Pulmonary:     Effort: Pulmonary effort is normal.     Breath sounds: Normal breath sounds.     Comments: Slightly distant breath sounds were clear.  No respiratory distress, speaking full sentences. Abdominal:     Palpations: Abdomen is soft. There is no pulsatile mass.     Tenderness: There is no abdominal tenderness.  Musculoskeletal:     Right lower leg: No edema.     Left lower leg: No edema.  Skin:    General: Skin is warm and dry.  Neurological:     Mental Status: She is alert and oriented to person, place, and time.  Psychiatric:        Mood and Affect: Mood normal.        Behavior: Behavior normal.        Assessment & Plan:  NECHA HARRIES is a 67 y.o. female . Chronic obstructive pulmonary disease, unspecified COPD type (HCC) Uncomplicated asthma, unspecified asthma severity, unspecified whether persistent  -Asthma with COPD.  Daily use of albuterol, decreased control.  Will try adding Breztri with albuterol as needed, for breakthrough symptoms but anticipate improved control.  RTC precautions if worsening or not improving with above  treatment.  48-month follow-up.  Depression with anxiety - Plan: FLUoxetine (PROZAC) 40 MG capsule  -Elevated readings on screening as above but reports stable symptoms and declines any medication changes at this time.  Continue counseling.  Has Klonopin if needed.  No recent use.  Hyperlipidemia, unspecified hyperlipidemia type - Plan: atorvastatin (LIPITOR) 40 MG tablet Hyperlipidemia with target LDL less than 70 - Plan: Comprehensive metabolic panel, Lipid panel  -Tolerating current dose Lipitor, continue same.  Check labs with adjustment of plan accordingly  Hypothyroidism, unspecified type - Plan: SYNTHROID 88 MCG tablet, TSH  -Tolerating Synthroid, check TSH, denies any symptoms, continue same dose with adjustment of plan based on labs  Subclavian steal syndrome - Plan: clopidogrel (PLAVIX) 75 MG tablet  -Stable on previous imaging, plan for CTA as above, continue follow-up with cardiology.  Plavix refilled.  Screen for colon cancer - Plan: Ambulatory referral to Gastroenterology for  colonoscopy   Meds ordered this encounter  Medications   FLUoxetine (PROZAC) 40 MG capsule    Sig: Take 1 capsule (40 mg total) by mouth daily.    Dispense:  90 capsule    Refill:  3   atorvastatin (LIPITOR) 40 MG tablet    Sig: Take 1 tablet (40 mg total) by mouth daily.    Dispense:  90 tablet    Refill:  2   SYNTHROID 88 MCG tablet    Sig: Take 1 tablet (88 mcg total) by mouth daily before breakfast.    Dispense:  90 tablet    Refill:  1    New dose.   clopidogrel (PLAVIX) 75 MG tablet    Sig: Take 1 tablet (75 mg total) by mouth daily.    Dispense:  90 tablet    Refill:  2   Budeson-Glycopyrrol-Formoterol (BREZTRI AEROSPHERE) 160-9-4.8 MCG/ACT AERO    Sig: Inhale 2 puffs into the lungs 2 (two) times daily.    Dispense:  10.7 g    Refill:  11   Patient Instructions  Try starting Breztri inhaler 2 puffs twice per day.  That should help if shortness of breath, cough is due to COPD and  possibly can help asthma as well.  Albuterol okay to use if needed but this medicine should significantly decrease that needed.  Follow-up in the next few weeks if not improving.  If any worsening cough, fever or other worsening symptoms be seen sooner.  No other med changes, recheck 6 months.  Take care.     Signed,   Meredith Staggers, MD [facility], [facility] 05/18/23 8:12 AM

## 2023-05-18 NOTE — Patient Instructions (Signed)
Try starting Breztri inhaler 2 puffs twice per day.  That should help if shortness of breath, cough is due to COPD and possibly can help asthma as well.  Albuterol okay to use if needed but this medicine should significantly decrease that needed.  Follow-up in the next few weeks if not improving.  If any worsening cough, fever or other worsening symptoms be seen sooner.  No other med changes, recheck 6 months.  Take care.

## 2023-06-27 ENCOUNTER — Encounter: Payer: Self-pay | Admitting: Family Medicine

## 2023-06-27 NOTE — Telephone Encounter (Signed)
Note sent to patient

## 2023-07-20 ENCOUNTER — Other Ambulatory Visit: Payer: Self-pay | Admitting: Family Medicine

## 2023-07-20 DIAGNOSIS — Z1212 Encounter for screening for malignant neoplasm of rectum: Secondary | ICD-10-CM

## 2023-07-20 DIAGNOSIS — Z1211 Encounter for screening for malignant neoplasm of colon: Secondary | ICD-10-CM

## 2023-08-14 ENCOUNTER — Ambulatory Visit (INDEPENDENT_AMBULATORY_CARE_PROVIDER_SITE_OTHER): Payer: Medicare Other | Admitting: *Deleted

## 2023-08-14 DIAGNOSIS — Z0000 Encounter for general adult medical examination without abnormal findings: Secondary | ICD-10-CM

## 2023-08-14 NOTE — Patient Instructions (Signed)
Bethany Clarke , Thank you for taking time to come for your Medicare Wellness Visit. I appreciate your ongoing commitment to your health goals. Please review the following plan we discussed and let me know if I can assist you in the future.   Screening recommendations/referrals: Colonoscopy: Education provided Mammogram: Education provided Bone Density: up to date Recommended yearly ophthalmology/optometry visit for glaucoma screening and checkup Recommended yearly dental visit for hygiene and checkup  Vaccinations: Influenza vaccine: Education provided Pneumococcal vaccine: up to date Tdap vaccine: up to date Shingles vaccine: Education provided    Advanced directives: Education provided    Preventive Care 65 Years and Older, Female Preventive care refers to lifestyle choices and visits with your health care provider that can promote health and wellness. What does preventive care include? A yearly physical exam. This is also called an annual well check. Dental exams once or twice a year. Routine eye exams. Ask your health care provider how often you should have your eyes checked. Personal lifestyle choices, including: Daily care of your teeth and gums. Regular physical activity. Eating a healthy diet. Avoiding tobacco and drug use. Limiting alcohol use. Practicing safe sex. Taking low-dose aspirin every day. Taking vitamin and mineral supplements as recommended by your health care provider. What happens during an annual well check? The services and screenings done by your health care provider during your annual well check will depend on your age, overall health, lifestyle risk factors, and family history of disease. Counseling  Your health care provider may ask you questions about your: Alcohol use. Tobacco use. Drug use. Emotional well-being. Home and relationship well-being. Sexual activity. Eating habits. History of falls. Memory and ability to understand  (cognition). Work and work Astronomer. Reproductive health. Screening  You may have the following tests or measurements: Height, weight, and BMI. Blood pressure. Lipid and cholesterol levels. These may be checked every 5 years, or more frequently if you are over 87 years old. Skin check. Lung cancer screening. You may have this screening every year starting at age 57 if you have a 30-pack-year history of smoking and currently smoke or have quit within the past 15 years. Fecal occult blood test (FOBT) of the stool. You may have this test every year starting at age 27. Flexible sigmoidoscopy or colonoscopy. You may have a sigmoidoscopy every 5 years or a colonoscopy every 10 years starting at age 61. Hepatitis C blood test. Hepatitis B blood test. Sexually transmitted disease (STD) testing. Diabetes screening. This is done by checking your blood sugar (glucose) after you have not eaten for a while (fasting). You may have this done every 1-3 years. Bone density scan. This is done to screen for osteoporosis. You may have this done starting at age 74. Mammogram. This may be done every 1-2 years. Talk to your health care provider about how often you should have regular mammograms. Talk with your health care provider about your test results, treatment options, and if necessary, the need for more tests. Vaccines  Your health care provider may recommend certain vaccines, such as: Influenza vaccine. This is recommended every year. Tetanus, diphtheria, and acellular pertussis (Tdap, Td) vaccine. You may need a Td booster every 10 years. Zoster vaccine. You may need this after age 72. Pneumococcal 13-valent conjugate (PCV13) vaccine. One dose is recommended after age 74. Pneumococcal polysaccharide (PPSV23) vaccine. One dose is recommended after age 43. Talk to your health care provider about which screenings and vaccines you need and how often you need them.  This information is not intended to  replace advice given to you by your health care provider. Make sure you discuss any questions you have with your health care provider. Document Released: 10/29/2015 Document Revised: 06/21/2016 Document Reviewed: 08/03/2015 Elsevier Interactive Patient Education  2017 [employer].  Fall Prevention in the Home Falls can cause injuries. They can happen to people of all ages. There are many things you can do to make your home safe and to help prevent falls. What can I do on the outside of my home? Regularly fix the edges of walkways and driveways and fix any cracks. Remove anything that might make you trip as you walk through a door, such as a raised step or threshold. Trim any bushes or trees on the path to your home. Use bright outdoor lighting. Clear any walking paths of anything that might make someone trip, such as rocks or tools. Regularly check to see if handrails are loose or broken. Make sure that both sides of any steps have handrails. Any raised decks and porches should have guardrails on the edges. Have any leaves, snow, or ice cleared regularly. Use sand or salt on walking paths during winter. Clean up any spills in your garage right away. This includes oil or grease spills. What can I do in the bathroom? Use night lights. Install grab bars by the toilet and in the tub and shower. Do not use towel bars as grab bars. Use non-skid mats or decals in the tub or shower. If you need to sit down in the shower, use a plastic, non-slip stool. Keep the floor dry. Clean up any water that spills on the floor as soon as it happens. Remove soap buildup in the tub or shower regularly. Attach bath mats securely with double-sided non-slip rug tape. Do not have throw rugs and other things on the floor that can make you trip. What can I do in the bedroom? Use night lights. Make sure that you have a light by your bed that is easy to reach. Do not use any sheets or blankets that are too big for  your bed. They should not hang down onto the floor. Have a firm chair that has side arms. You can use this for support while you get dressed. Do not have throw rugs and other things on the floor that can make you trip. What can I do in the kitchen? Clean up any spills right away. Avoid walking on wet floors. Keep items that you use a lot in easy-to-reach places. If you need to reach something above you, use a strong step stool that has a grab bar. Keep electrical cords out of the way. Do not use floor polish or wax that makes floors slippery. If you must use wax, use non-skid floor wax. Do not have throw rugs and other things on the floor that can make you trip. What can I do with my stairs? Do not leave any items on the stairs. Make sure that there are handrails on both sides of the stairs and use them. Fix handrails that are broken or loose. Make sure that handrails are as long as the stairways. Check any carpeting to make sure that it is firmly attached to the stairs. Fix any carpet that is loose or worn. Avoid having throw rugs at the top or bottom of the stairs. If you do have throw rugs, attach them to the floor with carpet tape. Make sure that you have a light switch at the  top of the stairs and the bottom of the stairs. If you do not have them, ask someone to add them for you. What else can I do to help prevent falls? Wear shoes that: Do not have high heels. Have rubber bottoms. Are comfortable and fit you well. Are closed at the toe. Do not wear sandals. If you use a stepladder: Make sure that it is fully opened. Do not climb a closed stepladder. Make sure that both sides of the stepladder are locked into place. Ask someone to hold it for you, if possible. Clearly mark and make sure that you can see: Any grab bars or handrails. First and last steps. Where the edge of each step is. Use tools that help you move around (mobility aids) if they are needed. These  include: Canes. Walkers. Scooters. Crutches. Turn on the lights when you go into a dark area. Replace any light bulbs as soon as they burn out. Set up your furniture so you have a clear path. Avoid moving your furniture around. If any of your floors are uneven, fix them. If there are any pets around you, be aware of where they are. Review your medicines with your doctor. Some medicines can make you feel dizzy. This can increase your chance of falling. Ask your doctor what other things that you can do to help prevent falls. This information is not intended to replace advice given to you by your health care provider. Make sure you discuss any questions you have with your health care provider. Document Released: 07/29/2009 Document Revised: 03/09/2016 Document Reviewed: 11/06/2014 Elsevier Interactive Patient Education  2017 [employer].

## 2023-08-14 NOTE — Progress Notes (Signed)
Subjective:   Bethany Clarke is a 67 y.o. female who presents for Medicare Annual (Subsequent) preventive examination.  Visit Complete: Virtual I connected with  Bethany Clarke on 08/14/23 by a audio enabled telemedicine application and verified that I am speaking with the correct person using two identifiers.  Patient Location: Home  Provider Location: Home Office  I discussed the limitations of evaluation and management by telemedicine. The patient expressed understanding and agreed to proceed.  Vital Signs: Because this visit was a virtual/telehealth visit, some criteria may be missing or patient reported. Any vitals not documented were not able to be obtained and vitals that have been documented are patient reported.       Objective:    There were no vitals filed for this visit. There is no height or weight on file to calculate BMI.     09/28/2022   11:34 PM 08/08/2022   11:22 AM 09/14/2021    3:18 PM 01/04/2019   11:26 PM 04/12/2015    3:04 PM 03/14/2013    3:48 PM 03/14/2013    6:40 AM  Advanced Directives  Does Patient Have a Medical Advance Directive? No No No No No Patient does not have advance directive;Patient would not like information Patient does not have advance directive  Would patient like information on creating a medical advance directive?  No - Patient declined No - Patient declined No - Patient declined No - patient declined information      Current Medications (verified) Outpatient Encounter Medications as of 08/14/2023  Medication Sig   albuterol (VENTOLIN HFA) 108 (90 Base) MCG/ACT inhaler INHALE 1-2 PUFFS INTO THE LUNGS EVERY 4 (FOUR) HOURS AS NEEDED FOR WHEEZING OR SHORTNESS OF BREATH.   aspirin EC 81 MG tablet Take 81 mg by mouth daily. Swallow whole.   atorvastatin (LIPITOR) 40 MG tablet Take 1 tablet (40 mg total) by mouth daily.   Budeson-Glycopyrrol-Formoterol (BREZTRI AEROSPHERE) 160-9-4.8 MCG/ACT AERO Inhale 2 puffs into the lungs 2  (two) times daily.   clopidogrel (PLAVIX) 75 MG tablet Take 1 tablet (75 mg total) by mouth daily.   FLUoxetine (PROZAC) 40 MG capsule Take 1 capsule (40 mg total) by mouth daily.   SYNTHROID 88 MCG tablet Take 1 tablet (88 mcg total) by mouth daily before breakfast.   No facility-administered encounter medications on file as of 08/14/2023.    Allergies (verified) Patient has no known allergies.   History: Past Medical History:  Diagnosis Date   Atherosclerosis 11/14/2005   carotid US - mild calcific non-stenotic plaque bilaterally   Carotid bruit 02/05/2009   Doppler - R/L ICAs 0-49% diameter reductin (velocities suggest low-mid range); L subclavian 0-49%  diameter reduction   Chronic bronchitis (HCC)    "growing up; not anymore" (03/14/2013)   Colon polyp    hyperplastic   Costochondritis    Dizziness    Emphysema of lung (HCC)    GERD (gastroesophageal reflux disease)    H. pylori infection    Heart burn    Hemorrhoids    Hypothyroid    Lipidemia    Pneumonia ~ 2009   Subclavian artery stenosis, left (HCC)    subclavian steal physiology status post PTA and stenting   Syncope, near    Past Surgical History:  Procedure Laterality Date   ABDOMINAL HYSTERECTOMY  1999   BILATERAL UPPER EXTREMITY ANGIOGRAM N/A 03/14/2013   Procedure: BILATERAL UPPER EXTREMITY ANGIOGRAM;  Surgeon: [name], MD;  Location: [facility] CATH LAB;  Service: Cardiovascular;  Laterality: N/A;   BREAST BIOPSY Right 1990's   "benign" (03/14/2013)   CARDIAC CATHETERIZATION  03/03/2013   DILATION AND CURETTAGE OF UTERUS  1980's   KNEE ARTHROSCOPY WITH MENISCAL REPAIR Left 07/21/2021   Procedure: Arthroscopic debridement, arthroscopic debridement of bucket-handle component of lateral meniscus tear, and arthroscopically-assisted repair of lateral meniscus root tear, left knee. ;  Surgeon: Christena Flake, MD;  Location: [facility] ORS;  Service: Orthopedics;  Laterality: Left;   LEFT HEART CATHETERIZATION WITH  CORONARY ANGIOGRAM Bilateral 03/03/2013   Procedure: LEFT HEART CATHETERIZATION WITH CORONARY ANGIOGRAM;  Surgeon: Lennette Bihari, MD;  Location: [facility] CATH LAB;  Service: Cardiovascular;  Laterality: Bilateral;   PERCUTANEOUS STENT INTERVENTION  03/14/2013   Procedure: PERCUTANEOUS STENT INTERVENTION;  Surgeon: [name], MD;  Location: [facility] CATH LAB;  Service: Cardiovascular;;   SUBCLAVIAN ARTERY STENT Left 03/14/2013    for subclavian steal/claudication (03/14/2013)   TUBAL LIGATION  1980's   Family History  Problem Relation Age of Onset   Cancer Brother        spine/lung   COPD Brother        smoked   Hypertension Brother    Rheum arthritis Brother    Hyperlipidemia Brother    Hypertension Brother    Diabetes Mother    Heart disease Mother    Stroke Mother    Cancer Father        cirrohosis   Hypertension Father    Heart disease Sister    Diabetes Sister    Hyperlipidemia Sister    Hypertension Sister    COPD Sister    Arthritis Sister        rheumatoid   Diabetes Sister    Arthritis Sister        rheumatoid   Colon cancer Neg Hx    Social History   Socioeconomic History   Marital status: Married    Spouse name: Renae Fickle   Number of children: 3   Years of education: Not on file   Highest education level: Some college, no degree  Occupational History   Occupation: Orthoptist: [employer]  Tobacco Use   Smoking status: Former    Current packs/day: 0.00    Average packs/day: 0.5 packs/day for 40.0 years (20.0 ttl pk-yrs)    Types: Cigarettes    Start date: 02/11/1973    Quit date: 02/11/2013    Years since quitting: 10.5   Smokeless tobacco: Never  Substance and Sexual Activity   Alcohol use: No   Drug use: No   Sexual activity: Yes    Comment: number of sex partners in the last 12 months  1  Other Topics Concern   Not on file  Social History Narrative   Daily caffeine. Married. Education: [employer]. Exercise: No.      Patient is  right-handed. She lives with her husband in a two level home. She drinks 2 cups of coffee a day. 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   Social Determinants of Health   Financial Resource Strain: Low Risk  (08/14/2023)   Overall Financial Resource Strain (CARDIA)    Difficulty of Paying Living Expenses: Not hard at all  Food Insecurity: No Food Insecurity (08/14/2023)   Hunger Vital Sign    Worried About Running Out of Food in the Last Year: Never true    Ran Out of Food in the Last Year: Never true  Transportation Needs: No Transportation Needs (08/14/2023)  PRAPARE - Administrator, Civil Service (Medical): No    Lack of Transportation (Non-Medical): No  Physical Activity: Sufficiently Active (08/14/2023)   Exercise Vital Sign    Days of Exercise per Week: 5 days    Minutes of Exercise per Session: 60 min  Stress: No Stress Concern Present (08/14/2023)   [employer] of Occupational Health - Occupational Stress Questionnaire    Feeling of Stress : Not at all  Social Connections: Moderately Integrated (08/14/2023)   Social Connection and Isolation Panel [NHANES]    Frequency of Communication with Friends and Family: Not on file    Frequency of Social Gatherings with Friends and Family: More than three times a week    Attends Religious Services: Never    Database administrator or Organizations: Yes    Attends Engineer, structural: More than 4 times per year    Marital Status: Married    Tobacco Counseling Counseling given: Not Answered   Clinical Intake:  Pre-visit preparation completed: Yes  Pain : No/denies pain     Diabetes: No  How often do you need to have someone help you when you read instructions, pamphlets, or other written materials from your doctor or pharmacy?: 1 - Never  Interpreter Needed?: No  Information entered by :: Remi Haggard LPN   Activities of Daily Living    08/14/2023    8:06 AM  In your present  state of health, do you have any difficulty performing the following activities:  Hearing? 0  Vision? 0  Difficulty concentrating or making decisions? 0  Walking or climbing stairs? 0  Dressing or bathing? 0  Doing errands, shopping? 0  Preparing Food and eating ? N  Using the Toilet? N  Do you have problems with loss of bowel control? N  Managing your Medications? N  Managing your Finances? N  Housekeeping or managing your Housekeeping? N    Patient Care Team: [name], MD as PCP - General (Family Medicine) Lennette Bihari, MD as PCP - Cardiology (Cardiology) Drema Dallas, DO as Consulting Physician (Neurology)  Indicate any recent Medical Services you may have received from other than [facility] providers in the past year (date may be approximate).     Assessment:   This is a routine wellness examination for Bethany Clarke.  Hearing/Vision screen Hearing Screening - Comments:: No trouble hearing Vision Screening - Comments:: [facility] Up to date   Goals Addressed             This Visit's Progress    Patient Stated       retire       Depression Screen    08/14/2023    8:08 AM 05/18/2023    8:08 AM 11/15/2022    8:27 AM 09/27/2022    1:36 PM 08/08/2022   11:21 AM 05/12/2022    9:13 AM 01/12/2022    1:41 PM  PHQ 2/9 Scores  PHQ - 2 Score 4 2 3 2  0 4 3  PHQ- 9 Score 13 13 10 6  13 7     Fall Risk    08/14/2023    8:03 AM 05/18/2023    8:07 AM 11/15/2022    8:28 AM 09/27/2022    1:37 PM 08/08/2022   11:20 AM  Fall Risk   Falls in the past year? 0 0 0 0 0  Number falls in past yr: 0 0 0 0 0  Injury with Fall? 0 0 0 0  0  Risk for fall due to :  No Fall Risks No Fall Risks No Fall Risks No Fall Risks  Follow up Falls evaluation completed;Education provided;Falls prevention discussed Falls evaluation completed Falls evaluation completed Falls evaluation completed Falls prevention discussed    MEDICARE RISK AT HOME: Medicare Risk at Home Any stairs in or  around the home?: Yes If so, are there any without handrails?: No Home free of loose throw rugs in walkways, pet beds, electrical cords, etc?: Yes Adequate lighting in your home to reduce risk of falls?: Yes Life alert?: No Use of a cane, walker or w/c?: No Grab bars in the bathroom?: No Shower chair or bench in shower?: No Elevated toilet seat or a handicapped toilet?: No  TIMED UP AND GO:  Was the test performed?  No    Cognitive Function:        08/14/2023    8:05 AM 08/08/2022   11:23 AM 05/12/2022   10:19 AM 05/12/2022    9:14 AM  6CIT Screen  What Year? 0 points 0 points 0 points 4 points  What month? 0 points 0 points 0 points 3 points  What time? 0 points 0 points 0 points 3 points  Count back from 20 0 points 0 points 0 points 4 points  Months in reverse 0 points 0 points 0 points 4 points  Repeat phrase 0 points 0 points 0 points 10 points  Total Score 0 points 0 points 0 points 28 points    Immunizations Immunization History  Administered Date(s) Administered   Influenza, High Dose Seasonal PF 09/04/2022   Influenza, Seasonal, Injecte, Preservative Fre 08/18/2017   Influenza,inj,Quad PF,6+ Mos 12/01/2015, 08/26/2017, 07/28/2018, 06/08/2019, 06/20/2020   PFIZER(Purple Top)SARS-COV-2 Vaccination 12/29/2019, 01/19/2020, 07/24/2020   PNEUMOCOCCAL CONJUGATE-20 05/12/2022   Pneumococcal Polysaccharide-23 10/17/2003   Rotavirus Monovalent 10/16/2000   Td 10/16/2000   Tdap 03/15/2007, 04/11/2017    TDAP status: Up to date  Flu Vaccine status: Due, Education has been provided regarding the importance of this vaccine. Advised may receive this vaccine at local pharmacy or Health Dept. Aware to provide a copy of the vaccination record if obtained from local pharmacy or Health Dept. Verbalized acceptance and understanding.  Pneumococcal vaccine status: Up to date  Covid-19 vaccine status: Information provided on how to obtain vaccines.   Qualifies for Shingles  Vaccine? Yes   Zostavax completed No   Shingrix Completed?: No.    Education has been provided regarding the importance of this vaccine. Patient has been advised to call insurance company to determine out of pocket expense if they have not yet received this vaccine. Advised may also receive vaccine at local pharmacy or Health Dept. Verbalized acceptance and understanding.  Screening Tests Health Maintenance  Topic Date Due   COVID-19 Vaccine (4 - 2023-24 season) 08/30/2023 (Originally 06/17/2023)   Lung Cancer Screening  09/28/2023 (Originally 12/23/2016)   INFLUENZA VACCINE  01/14/2024 (Originally 05/17/2023)   Zoster Vaccines- Shingrix (1 of 2) 07/15/2024 (Originally 07/18/2006)   Fecal DNA (Cologuard)  08/12/2024 (Originally 07/18/2001)   MAMMOGRAM  08/13/2024 (Originally 01/21/2023)   Hepatitis C Screening  08/13/2024 (Originally 07[birth year]18/1974)   Medicare Annual Wellness (AWV)  08/13/2024   DTaP/Tdap/Td (4 - Td or Tdap) 04/12/2027   Pneumonia Vaccine 73+ Years old  Completed   DEXA SCAN  Completed   HPV VACCINES  Aged Out   Colonoscopy  Discontinued    Health Maintenance  There are no preventive care reminders to display for this patient.  Colonoscopy  Education provided for Cologuard and Colonoscopy  Mammogram status: Completed 2023. Repeat every year   Education provided  Bone Density status: Completed  . Results reflect: Bone density results: NORMAL. Repeat every 0 years.  Lung Cancer Screening: (Low Dose CT Chest recommended if Age 92-80 years, 20 pack-year currently smoking OR have quit w/in 15years.) does qualify.   Lung Cancer Screening Referral: Education provided  Additional Screening:  Hepatitis C Screening   never done  Vision Screening: Recommended annual ophthalmology exams for early detection of glaucoma and other disorders of the eye. Is the patient up to date with their annual eye exam?  Yes  Who is the provider or what is the name of the office in which the patient  attends annual eye exams? [facility] If pt is not established with a provider, would they like to be referred to a provider to establish care? No .   Dental Screening: Recommended annual dental exams for proper oral hygiene   Community Resource Referral / Chronic Care Management: CRR required this visit?  No   CCM required this visit?  No     Plan:     I have personally reviewed and noted the following in the patient's chart:   Medical and social history Use of alcohol, tobacco or illicit drugs  Current medications and supplements including opioid prescriptions. Patient is not currently taking opioid prescriptions. Functional ability and status Nutritional status Physical activity Advanced directives List of other physicians Hospitalizations, surgeries, and ER visits in previous 12 months Vitals Screenings to include cognitive, depression, and falls Referrals and appointments  In addition, I have reviewed and discussed with patient certain preventive protocols, quality metrics, and best practice recommendations. A written personalized care plan for preventive services as well as general preventive health recommendations were provided to patient.     Remi Haggard, LPN   [id number]/[id number]   After Visit Summary: (MyChart) Due to this being a telephonic visit, the after visit summary with patients personalized plan was offered to patient via MyChart   Nurse Notes:

## 2023-09-10 ENCOUNTER — Encounter: Payer: Self-pay | Admitting: Cardiovascular Disease

## 2023-09-10 DIAGNOSIS — I6523 Occlusion and stenosis of bilateral carotid arteries: Secondary | ICD-10-CM

## 2023-09-18 ENCOUNTER — Telehealth (HOSPITAL_BASED_OUTPATIENT_CLINIC_OR_DEPARTMENT_OTHER): Payer: Self-pay | Admitting: Cardiovascular Disease

## 2023-10-08 ENCOUNTER — Ambulatory Visit (HOSPITAL_BASED_OUTPATIENT_CLINIC_OR_DEPARTMENT_OTHER)
Admission: RE | Admit: 2023-10-08 | Discharge: 2023-10-08 | Disposition: A | Discharge status: Home / Self Care | Payer: Medicare Other | Source: Ambulatory Visit | Attending: Cardiovascular Disease | Admitting: Cardiovascular Disease

## 2023-10-08 DIAGNOSIS — I6523 Occlusion and stenosis of bilateral carotid arteries: Secondary | ICD-10-CM | POA: Diagnosis present

## 2023-10-08 MED ORDER — IOHEXOL 350 MG/ML SOLN
100.0000 mL | Freq: Once | INTRAVENOUS | Status: AC | PRN
  Administered 2023-10-08: 75 mL via INTRAVENOUS

## 2023-10-15 ENCOUNTER — Encounter: Payer: Self-pay | Admitting: Cardiovascular Disease

## 2023-10-23 ENCOUNTER — Telehealth: Payer: Self-pay

## 2023-10-23 ENCOUNTER — Other Ambulatory Visit (HOSPITAL_COMMUNITY): Payer: Self-pay

## 2023-10-23 NOTE — Telephone Encounter (Signed)
 Pharmacy Patient Advocate Encounter   Received notification from Pt Calls Messages that prior authorization for SYNTHROID  is required/requested.   Insurance verification completed.   The patient is insured through [facility] .   Per test claim: PA required; PA submitted to above mentioned insurance via CoverMyMeds Key/confirmation #/EOC [facility] Status is pending

## 2023-10-23 NOTE — Telephone Encounter (Signed)
 PA request has been Submitted. New Encounter created for follow up. For additional info see Pharmacy Prior Auth telephone encounter from 10/23/23.

## 2023-10-23 NOTE — Telephone Encounter (Signed)
 Received a fax from [facility] stating that pt's synthroid is no longer on their formulary. No alternates listed. Please initiate a prior auth. Fax uploaded under media tab.

## 2023-10-23 NOTE — Telephone Encounter (Deleted)
 PA request has been Submitted. New Encounter created for follow up. For additional info see Pharmacy Prior Auth telephone encounter from 10/23/23.

## 2023-10-24 ENCOUNTER — Other Ambulatory Visit (HOSPITAL_BASED_OUTPATIENT_CLINIC_OR_DEPARTMENT_OTHER): Payer: Self-pay

## 2023-10-24 ENCOUNTER — Other Ambulatory Visit (HOSPITAL_COMMUNITY): Payer: Self-pay

## 2023-10-24 ENCOUNTER — Telehealth: Payer: Self-pay

## 2023-10-24 DIAGNOSIS — I6523 Occlusion and stenosis of bilateral carotid arteries: Secondary | ICD-10-CM

## 2023-10-24 DIAGNOSIS — E039 Hypothyroidism, unspecified: Secondary | ICD-10-CM

## 2023-10-24 DIAGNOSIS — E785 Hyperlipidemia, unspecified: Secondary | ICD-10-CM

## 2023-10-24 NOTE — Telephone Encounter (Signed)
Sent  to Dr.Greene 

## 2023-10-24 NOTE — Telephone Encounter (Signed)
 Pt has been notified.

## 2023-10-24 NOTE — Telephone Encounter (Signed)
 Pharmacy Patient Advocate Encounter  Received notification from [facility] that Prior Authorization for SYNTHROID   has been APPROVED from 10/23/23 to 10/22/24. Unable to obtain price due to refill too soon rejection, last fill date 10/22/23 next available fill date1/30/25   PA #/Case ID/Reference #:  [id number]

## 2023-10-25 NOTE — Telephone Encounter (Addendum)
 Orders placed, okay to schedule lab visit a week or so before her appointment.

## 2023-10-25 NOTE — Addendum Note (Signed)
 Addended by: Meredith Staggers R on: 10/25/2023 08:05 AM   Modules accepted: Orders

## 2023-10-25 NOTE — Telephone Encounter (Signed)
 Patient has been scheduled for a lab only appt

## 2023-11-13 ENCOUNTER — Other Ambulatory Visit (INDEPENDENT_AMBULATORY_CARE_PROVIDER_SITE_OTHER): Payer: Medicare Other

## 2023-11-13 DIAGNOSIS — E785 Hyperlipidemia, unspecified: Secondary | ICD-10-CM | POA: Diagnosis not present

## 2023-11-13 DIAGNOSIS — E039 Hypothyroidism, unspecified: Secondary | ICD-10-CM | POA: Diagnosis not present

## 2023-11-13 LAB — COMPREHENSIVE METABOLIC PANEL
ALT: 18 U/L (ref 0–35)
AST: 19 U/L (ref 0–37)
Albumin: 4.5 g/dL (ref 3.5–5.2)
Alkaline Phosphatase: 67 U/L (ref 39–117)
BUN: 8 mg/dL (ref 6–23)
CO2: 31 meq/L (ref 19–32)
Calcium: 9.7 mg/dL (ref 8.4–10.5)
Chloride: 98 meq/L (ref 96–112)
Creatinine, Ser: 0.69 mg/dL (ref 0.40–1.20)
GFR: 89.87 mL/min (ref >60.00)
Glucose, Bld: 74 mg/dL (ref 70–99)
Potassium: 4 meq/L (ref 3.5–5.1)
Sodium: 136 meq/L (ref 135–145)
Total Bilirubin: 0.3 mg/dL (ref 0.2–1.2)
Total Protein: 6.6 g/dL (ref 6.0–8.3)

## 2023-11-13 LAB — LIPID PANEL
Cholesterol: 120 mg/dL (ref 0–200)
HDL: 49.8 mg/dL (ref >39.00)
LDL Cholesterol: 57 mg/dL (ref 0–99)
NonHDL: 70.04
Total CHOL/HDL Ratio: 2
Triglycerides: 67 mg/dL (ref 0.0–149.0)
VLDL: 13.4 mg/dL (ref 0.0–40.0)

## 2023-11-13 LAB — TSH: TSH: 3.5 u[IU]/mL (ref 0.35–5.50)

## 2023-11-16 ENCOUNTER — Encounter: Payer: Self-pay | Admitting: Family Medicine

## 2023-11-19 ENCOUNTER — Ambulatory Visit: Payer: Medicare Other | Admitting: Family Medicine

## 2023-11-28 ENCOUNTER — Ambulatory Visit: Payer: Medicare Other | Admitting: Family Medicine

## 2023-11-28 VITALS — BP 122/60 | HR 74 | Temp 99.3°F | Ht 64.0 in | Wt 154.6 lb

## 2023-11-28 DIAGNOSIS — M79622 Pain in left upper arm: Secondary | ICD-10-CM

## 2023-11-28 DIAGNOSIS — M8588 Other specified disorders of bone density and structure, other site: Secondary | ICD-10-CM

## 2023-11-28 DIAGNOSIS — J449 Chronic obstructive pulmonary disease, unspecified: Secondary | ICD-10-CM | POA: Diagnosis not present

## 2023-11-28 DIAGNOSIS — M546 Pain in thoracic spine: Secondary | ICD-10-CM

## 2023-11-28 DIAGNOSIS — E039 Hypothyroidism, unspecified: Secondary | ICD-10-CM

## 2023-11-28 DIAGNOSIS — F418 Other specified anxiety disorders: Secondary | ICD-10-CM

## 2023-11-28 DIAGNOSIS — E785 Hyperlipidemia, unspecified: Secondary | ICD-10-CM

## 2023-11-28 NOTE — Patient Instructions (Signed)
Thanks for coming in today.  Please have x-ray performed at the [facility] location below.  I am suspicious of a pinched nerve from her upper thoracic spine which is the nerve that is associated with the armpit area.  You can try gabapentin 100 mg once per day for now then increase to twice per day if that is tolerated and still having some breakthrough burning pain.  Depending on x-ray results can discuss next step.  No change in cholesterol, depression, thyroid medications for now.  I would consider a different inhaler for COPD if you require albuterol more frequently.  Let me know.  Take care!

## 2023-11-28 NOTE — Progress Notes (Signed)
Subjective:  Patient ID: Bethany Clarke, female    DOB: May 30, 1956  Age: 68 y.o. MRN: [id number]  CC:  Chief Complaint  Patient presents with   Rash    Burning pain in the Lt under arm, notes started mid-end of January,   COPD    Pt having pain in middle back and didn't know if it was time for a CXR again     HPI Bethany Clarke presents for concerns above.   Left arm discomfort: In left axilla - past 3 weeks. No rash/blisters. No arm radiation - axilla only. No neck pain. No lesions.  Tx: none.  About the same. Worse if she feels tense.   Mid back pain, History of COPD.   See prior notes.  Possible complex or combined asthma and COPD has some reversibility with albuterol previously.  We have discussed pulmonary follow-up previously but declined.  Albuterol once per day when discussed in August of last year, with some increased cough at night.  She agreed to try new med.  Started on [place], with albuterol as needed. Last chest x-ray 09/29/2022 with mild bibasilar subsegmental atelectasis, atherosclerosis. Bone density testing in 2023 with osteopenia.  Mid back pain - past month - not sure if lifted something wrong, but NKI.  Similar timing of axillary pain. No falls.  No new cough.  Did not use breztri - too expensive. Using albuterol if going uphill or up stairs - 1-2 times per week. Declines new meds at this time.  No fever/chest pains.  Declines pulmonary follow up.   Hypothyroidism: Lab Results  Component Value Date   TSH 3.50 11/13/2023  Synthroid 88 mcg daily, stable on recent labs.  Cardiac History of bilateral carotid artery stenosis, subclavian artery stenosis with prior stenting, followed by cardiology.  Treated with Plavix, Lipitor.  LDL 57 on recent labs. No new side effects.  Lab Results  Component Value Date   CHOL 120 11/13/2023   HDL 49.80 11/13/2023   LDLCALC 57 11/13/2023   TRIG 67.0 11/13/2023   CHOLHDL 2 11/13/2023    Depression with  anxiety: Fluoxetine 40 mg daily, rare need for breakthrough Klonopin for breakthrough anxiety in the past.  Meeting with therapist intermittently at her last visit in August.  Declined med adjustments at that time. No recent use of klonopin. Not needed.  No SI/HI, declines med changes. No new side effects.      08/14/2023    8:08 AM 05/18/2023    8:08 AM 11/15/2022    8:27 AM 09/27/2022    1:36 PM 08/08/2022   11:21 AM  Depression screen PHQ 2/9  Decreased Interest 2 1 2 1  0  Down, Depressed, Hopeless 2 1 1 1  0  PHQ - 2 Score 4 2 3 2  0  Altered sleeping 2 2 1 2    Tired, decreased energy 3 2 1 1    Change in appetite 3 2 2  0   Feeling bad or failure about yourself  1 2 1  0   Trouble concentrating 0 2 1 1    Moving slowly or fidgety/restless 0 1 1 0   Suicidal thoughts 0 0 0 0   PHQ-9 Score 13 13 10 6    Difficult doing work/chores Somewhat difficult Not difficult at all      History Patient Active Problem List   Diagnosis Date Noted   Neck pain on right side 10/04/2022   Cervico-occipital neuralgia of right side 10/04/2022   Chest pain, atypical 03/17/2016  Paresthesia 07/14/2013   Bilateral foot pain 05/01/2013   Subclavian artery stenosis, left (HCC)    Subclavian steal syndrome 03/16/2013   Subclavian arterial stenosis (HCC) 03/11/2013   Carotid stenosis 03/11/2013   Hyperlipidemia with target LDL less than 70 03/11/2013   GERD (gastroesophageal reflux disease)    Hypothyroid    Dizziness    Syncope, near    Heart burn    Hypothyroidism 05/28/2007   COPD GOLD II with reversibility  05/28/2007   Past Medical History:  Diagnosis Date   Atherosclerosis 11/14/2005   carotid US - mild calcific non-stenotic plaque bilaterally   Carotid bruit 02/05/2009   Doppler - R/L ICAs 0-49% diameter reductin (velocities suggest low-mid range); L subclavian 0-49%  diameter reduction   Chronic bronchitis (HCC)    "growing up; not anymore" (03/14/2013)   Colon polyp    hyperplastic    Costochondritis    Dizziness    Emphysema of lung (HCC)    GERD (gastroesophageal reflux disease)    H. pylori infection    Heart burn    Hemorrhoids    Hypothyroid    Lipidemia    Pneumonia ~ 2009   Subclavian artery stenosis, left (HCC)    subclavian steal physiology status post PTA and stenting   Syncope, near    Past Surgical History:  Procedure Laterality Date   ABDOMINAL HYSTERECTOMY  1999   BILATERAL UPPER EXTREMITY ANGIOGRAM N/A 03/14/2013   Procedure: BILATERAL UPPER EXTREMITY ANGIOGRAM;  Surgeon: [name], MD;  Location: [facility] CATH LAB;  Service: Cardiovascular;  Laterality: N/A;   BREAST BIOPSY Right 1990's   "benign" (03/14/2013)   CARDIAC CATHETERIZATION  03/03/2013   DILATION AND CURETTAGE OF UTERUS  1980's   KNEE ARTHROSCOPY WITH MENISCAL REPAIR Left 07/21/2021   Procedure: Arthroscopic debridement, arthroscopic debridement of bucket-handle component of lateral meniscus tear, and arthroscopically-assisted repair of lateral meniscus root tear, left knee. ;  Surgeon: Christena Flake, MD;  Location: [facility] ORS;  Service: Orthopedics;  Laterality: Left;   LEFT HEART CATHETERIZATION WITH CORONARY ANGIOGRAM Bilateral 03/03/2013   Procedure: LEFT HEART CATHETERIZATION WITH CORONARY ANGIOGRAM;  Surgeon: Lennette Bihari, MD;  Location: [facility] CATH LAB;  Service: Cardiovascular;  Laterality: Bilateral;   PERCUTANEOUS STENT INTERVENTION  03/14/2013   Procedure: PERCUTANEOUS STENT INTERVENTION;  Surgeon: [name], MD;  Location: [facility] CATH LAB;  Service: Cardiovascular;;   SUBCLAVIAN ARTERY STENT Left 03/14/2013    for subclavian steal/claudication (03/14/2013)   TUBAL LIGATION  1980's   No Known Allergies Prior to Admission medications   Medication Sig Start Date End Date Taking? Authorizing Provider  albuterol (VENTOLIN HFA) 108 (90 Base) MCG/ACT inhaler INHALE 1-2 PUFFS INTO THE LUNGS EVERY 4 (FOUR) HOURS AS NEEDED FOR WHEEZING OR SHORTNESS OF BREATH. 05/09/23  Yes [name], MD  aspirin EC 81 MG tablet Take 81 mg by mouth daily. Swallow whole.   Yes [provider]  atorvastatin (LIPITOR) 40 MG tablet Take 1 tablet (40 mg total) by mouth daily. 05/18/23  Yes [name], MD  Budeson-Glycopyrrol-Formoterol (BREZTRI AEROSPHERE) 160-9-4.8 MCG/ACT AERO Inhale 2 puffs into the lungs 2 (two) times daily. 05/18/23  Yes [name], MD  clopidogrel (PLAVIX) 75 MG tablet Take 1 tablet (75 mg total) by mouth daily. 05/18/23  Yes [name], MD  FLUoxetine (PROZAC) 40 MG capsule Take 1 capsule (40 mg total) by mouth daily. 05/18/23  Yes [name], MD  SYNTHROID 88 MCG tablet Take 1  tablet (88 mcg total) by mouth daily before breakfast. 05/18/23  Yes [name], MD   Social History   Socioeconomic History   Marital status: Married    Spouse name: Renae Fickle   Number of children: 3   Years of education: Not on file   Highest education level: Some college, no degree  Occupational History   Occupation: Orthoptist: [employer]  Tobacco Use   Smoking status: Former    Current packs/day: 0.00    Average packs/day: 0.5 packs/day for 40.0 years (20.0 ttl pk-yrs)    Types: Cigarettes    Start date: 02/11/1973    Quit date: 02/11/2013    Years since quitting: 10.8   Smokeless tobacco: Never  Substance and Sexual Activity   Alcohol use: No   Drug use: No   Sexual activity: Yes    Comment: number of sex partners in the last 12 months  1  Other Topics Concern   Not on file  Social History Narrative   Daily caffeine. Married. Education: [employer]. Exercise: No.      Patient is right-handed. She lives with her husband in a two level home. She drinks 2 cups of coffee a day. 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   Social Drivers of Corporate investment banker Strain: Low Risk  (08/14/2023)   Overall Financial Resource Strain (CARDIA)    Difficulty of Paying Living Expenses: Not hard at all  Food Insecurity:  No Food Insecurity (08/14/2023)   Hunger Vital Sign    Worried About Running Out of Food in the Last Year: Never true    Ran Out of Food in the Last Year: Never true  Transportation Needs: No Transportation Needs (08/14/2023)   PRAPARE - Administrator, Civil Service (Medical): No    Lack of Transportation (Non-Medical): No  Physical Activity: Sufficiently Active (08/14/2023)   Exercise Vital Sign    Days of Exercise per Week: 5 days    Minutes of Exercise per Session: 60 min  Stress: No Stress Concern Present (08/14/2023)   [employer] of Occupational Health - Occupational Stress Questionnaire    Feeling of Stress : Not at all  Social Connections: Moderately Integrated (08/14/2023)   Social Connection and Isolation Panel [NHANES]    Frequency of Communication with Friends and Family: Not on file    Frequency of Social Gatherings with Friends and Family: More than three times a week    Attends Religious Services: Never    Database administrator or Organizations: Yes    Attends Engineer, structural: More than 4 times per year    Marital Status: Married  Catering manager Violence: Not At Risk (08/14/2023)   Humiliation, Afraid, Rape, and Kick questionnaire    Fear of Current or Ex-Partner: No    Emotionally Abused: No    Physically Abused: No    Sexually Abused: No    Review of Systems   Objective:   Vitals:   11/28/23 1454  BP: 122/60  Pulse: 74  Temp: 99.3 F (37.4 C)  TempSrc: Temporal  SpO2: 94%  Weight: 154 lb 9.6 oz (70.1 kg)  Height: 5\' 4"  (1.626 m)     Physical Exam Vitals reviewed.  Constitutional:      Appearance: Normal appearance. She is well-developed.  HENT:     Head: Normocephalic and atraumatic.  Eyes:     Conjunctiva/sclera: Conjunctivae normal.  Pupils: Pupils are equal, round, and reactive to light.  Neck:     Vascular: No carotid bruit.  Cardiovascular:     Rate and Rhythm: Normal rate and regular rhythm.      Heart sounds: Normal heart sounds.  Pulmonary:     Effort: Pulmonary effort is normal.     Breath sounds: Normal breath sounds.  Abdominal:     Palpations: Abdomen is soft. There is no pulsatile mass.     Tenderness: There is no abdominal tenderness.  Musculoskeletal:     Right lower leg: No edema.     Left lower leg: No edema.     Comments: Midline bony tenderness of the upper thoracic spine, approximately T1-T4.  No rash.  No soft tissue swelling or lesions noted.  There is a well-healed scar mid upper back, denies recent surgery or known skin lesion removal.  Axilla nontender [place] but notes burning sensation into the mid axilla.  Skin without lesions, no nodules or lymphadenopathy appreciated.  Skin:    General: Skin is warm and dry.  Neurological:     Mental Status: She is alert and oriented to person, place, and time.  Psychiatric:        Mood and Affect: Mood normal.        Behavior: Behavior normal.        Assessment & Plan:  MIKHAELA ZAUGG is a 68 y.o. female . Acute midline thoracic back pain - Plan: DG Thoracic Spine 2 View Osteopenia of spine - Plan: DG Thoracic Spine 2 View Axillary pain, left - Plan: DG Thoracic Spine 2 View  -Suspected T1  neuronal impingement with axillary symptoms into the left, unable to reproduce with palpation on exam.  Midline bony tender with history of osteopenia, no known injury but will check T-spine x-ray initially.  Has gabapentin leftover from previous neuropathy, can try 100 mg daily to twice daily if needed.  X-ray results can decide next step.  RTC precautions if new/worsening symptoms.  Chronic obstructive pulmonary disease, unspecified COPD type (HCC)  -Option of additional maintenance inhaler discussed but she declines at this time.  RTC precautions if increased albuterol usage.  Declines pulmonary follow-up.  Depression with anxiety  -Reports stable symptoms, declines medication changes at this time.  Hyperlipidemia,  unspecified hyperlipidemia type  -Stable with current regimen, continue same and follow-up with cardiology as planned  Hypothyroidism, unspecified type  -Stable on most recent labs, continue same dose Synthroid.  No orders of the defined types were placed in this encounter.  Patient Instructions  Thanks for coming in today.  Please have x-ray performed at the [facility] location below.  I am suspicious of a pinched nerve from her upper thoracic spine which is the nerve that is associated with the armpit area.  You can try gabapentin 100 mg once per day for now then increase to twice per day if that is tolerated and still having some breakthrough burning pain.  Depending on x-ray results can discuss next step.  No change in cholesterol, depression, thyroid medications for now.  I would consider a different inhaler for COPD if you require albuterol more frequently.  Let me know.  Take care!    Signed,   Meredith Staggers, MD [facility], [facility] 11/28/23 3:42 PM

## 2023-11-30 ENCOUNTER — Ambulatory Visit (HOSPITAL_BASED_OUTPATIENT_CLINIC_OR_DEPARTMENT_OTHER)
Admission: RE | Admit: 2023-11-30 | Discharge: 2023-11-30 | Disposition: A | Discharge status: Home / Self Care | Payer: Medicare Other | Source: Ambulatory Visit | Attending: Family Medicine | Admitting: Family Medicine

## 2023-11-30 DIAGNOSIS — M546 Pain in thoracic spine: Secondary | ICD-10-CM | POA: Diagnosis present

## 2023-11-30 DIAGNOSIS — M79622 Pain in left upper arm: Secondary | ICD-10-CM | POA: Diagnosis present

## 2023-11-30 DIAGNOSIS — M8588 Other specified disorders of bone density and structure, other site: Secondary | ICD-10-CM | POA: Insufficient documentation

## 2023-12-01 ENCOUNTER — Encounter: Payer: Self-pay | Admitting: Family Medicine

## 2023-12-05 ENCOUNTER — Other Ambulatory Visit: Payer: Self-pay | Admitting: Family Medicine

## 2023-12-05 ENCOUNTER — Other Ambulatory Visit: Payer: Self-pay

## 2023-12-05 DIAGNOSIS — E039 Hypothyroidism, unspecified: Secondary | ICD-10-CM

## 2023-12-05 MED ORDER — SYNTHROID 88 MCG PO TABS: 88.0000 ug | ORAL_TABLET | Freq: Every day | ORAL | 1 refills | Status: DC

## 2024-03-02 ENCOUNTER — Other Ambulatory Visit: Payer: Self-pay | Admitting: Family Medicine

## 2024-03-02 DIAGNOSIS — E785 Hyperlipidemia, unspecified: Secondary | ICD-10-CM

## 2024-03-26 ENCOUNTER — Other Ambulatory Visit: Payer: Self-pay | Admitting: Family Medicine

## 2024-03-26 DIAGNOSIS — E039 Hypothyroidism, unspecified: Secondary | ICD-10-CM

## 2024-04-02 ENCOUNTER — Encounter: Payer: Self-pay | Admitting: Family Medicine

## 2024-04-02 DIAGNOSIS — E039 Hypothyroidism, unspecified: Secondary | ICD-10-CM

## 2024-04-02 DIAGNOSIS — R5383 Other fatigue: Secondary | ICD-10-CM

## 2024-04-02 NOTE — Telephone Encounter (Signed)
 Patient is calling in wanting to have mackenzie to give her a call back

## 2024-04-02 NOTE — Telephone Encounter (Signed)
 Patient asking for you to order labs for new fatigue

## 2024-04-02 NOTE — Telephone Encounter (Signed)
 TSH was low in January 2024, but stable since that time.  60-month follow-up planned for med check at her February visit, with last TSH in January.  Looks like she has an appointment scheduled with me August 13th.   If she is having new fatigue I do want to meet with her to discuss this further as thyroid  is only one of many causes of fatigue.  However, I am happy to order the labs for a lab only visit prior to that appointment, so we will have those results to review (even if it is thyroid , will need to discuss plan).  Please schedule lab visit within 1 week, appointment within a few days after that lab visit.  If she is having any acute worsening symptoms, needs to be seen sooner.  Thanks

## 2024-04-03 NOTE — Telephone Encounter (Signed)
 Called patient to get her scheduled for lab visit closer to her appointment on 7/14 as stated in recent messages. Patient cut me off and would not let me finish explaining. She asked did her message not make sense of what she wanted and if we could not complete things the way she wanted she would see another provider. She stated she could easily get these test completed online and hung up. I spoke to [place] about this before I called the patient and she was nearby when the phone call took place. She asked that I send this to Dr. Ester Helms and include her.   Dr. Ester Helms, would you like Bethany Clarke to call this patient or how would you like to proceed?

## 2024-04-05 ENCOUNTER — Other Ambulatory Visit: Payer: Self-pay | Admitting: Family Medicine

## 2024-04-05 DIAGNOSIS — G458 Other transient cerebral ischemic attacks and related syndromes: Secondary | ICD-10-CM

## 2024-04-08 ENCOUNTER — Other Ambulatory Visit (INDEPENDENT_AMBULATORY_CARE_PROVIDER_SITE_OTHER)

## 2024-04-08 DIAGNOSIS — R5383 Other fatigue: Secondary | ICD-10-CM

## 2024-04-08 DIAGNOSIS — E039 Hypothyroidism, unspecified: Secondary | ICD-10-CM | POA: Diagnosis not present

## 2024-04-08 LAB — CBC
HCT: 38.5 % (ref 36.0–46.0)
Hemoglobin: 12.9 g/dL (ref 12.0–15.0)
MCHC: 33.6 g/dL (ref 30.0–36.0)
MCV: 99.5 fl (ref 78.0–100.0)
Platelets: 281 10*3/uL (ref 150.0–400.0)
RBC: 3.87 Mil/uL (ref 3.87–5.11)
RDW: 13.2 % (ref 11.5–15.5)
WBC: 5.8 10*3/uL (ref 4.0–10.5)

## 2024-04-08 LAB — COMPREHENSIVE METABOLIC PANEL WITH GFR
ALT: 36 U/L — ABNORMAL HIGH (ref 0–35)
AST: 37 U/L (ref 0–37)
Albumin: 4.6 g/dL (ref 3.5–5.2)
Alkaline Phosphatase: 64 U/L (ref 39–117)
BUN: 11 mg/dL (ref 6–23)
CO2: 29 meq/L (ref 19–32)
Calcium: 9.6 mg/dL (ref 8.4–10.5)
Chloride: 94 meq/L — ABNORMAL LOW (ref 96–112)
Creatinine, Ser: 0.84 mg/dL (ref 0.40–1.20)
GFR: 71.75 mL/min (ref >60.00)
Glucose, Bld: 80 mg/dL (ref 70–99)
Potassium: 4.4 meq/L (ref 3.5–5.1)
Sodium: 128 meq/L — ABNORMAL LOW (ref 135–145)
Total Bilirubin: 0.4 mg/dL (ref 0.2–1.2)
Total Protein: 7.2 g/dL (ref 6.0–8.3)

## 2024-04-08 LAB — TSH: TSH: 38 u[IU]/mL — ABNORMAL HIGH (ref 0.35–5.50)

## 2024-04-09 ENCOUNTER — Ambulatory Visit: Payer: Self-pay | Admitting: Family Medicine

## 2024-04-09 ENCOUNTER — Telehealth: Payer: Self-pay

## 2024-04-09 NOTE — Telephone Encounter (Signed)
 Patient is wanting to make sure you see her blood work before her appointment tomorrow. The results are in and have informed her you will read then as soon as you can and will go over the results tomorrow with her at her appointment.

## 2024-04-09 NOTE — Telephone Encounter (Signed)
Noted. See lab result note.

## 2024-04-09 NOTE — Telephone Encounter (Signed)
 Copied from CRM [id number]. Topic: Appointments - Appointment Cancel/Reschedule >> Apr 09, 2024 10:18 AM Rosina BIRCH wrote: Patient/patient representative is calling to cancel or reschedule an appointment. Refer to attachments for appointment information.    Patient called wanting to make sure that the doctor reads her blood results

## 2024-04-10 ENCOUNTER — Encounter: Payer: Self-pay | Admitting: Family Medicine

## 2024-04-10 ENCOUNTER — Ambulatory Visit: Payer: Self-pay | Admitting: Family Medicine

## 2024-04-10 ENCOUNTER — Emergency Department (HOSPITAL_COMMUNITY)
Admission: EM | Admit: 2024-04-10 | Discharge: 2024-04-10 | Disposition: A | Discharge status: Home / Self Care | Attending: Emergency Medicine | Admitting: Emergency Medicine

## 2024-04-10 ENCOUNTER — Other Ambulatory Visit: Payer: Self-pay

## 2024-04-10 ENCOUNTER — Encounter (HOSPITAL_COMMUNITY): Payer: Self-pay | Admitting: Emergency Medicine

## 2024-04-10 ENCOUNTER — Ambulatory Visit (INDEPENDENT_AMBULATORY_CARE_PROVIDER_SITE_OTHER): Admitting: Family Medicine

## 2024-04-10 VITALS — BP 124/70 | HR 65 | Temp 97.9°F | Ht 64.0 in | Wt 134.6 lb

## 2024-04-10 DIAGNOSIS — R5383 Other fatigue: Secondary | ICD-10-CM | POA: Diagnosis not present

## 2024-04-10 DIAGNOSIS — E039 Hypothyroidism, unspecified: Secondary | ICD-10-CM | POA: Diagnosis not present

## 2024-04-10 DIAGNOSIS — Z7902 Long term (current) use of antithrombotics/antiplatelets: Secondary | ICD-10-CM | POA: Insufficient documentation

## 2024-04-10 DIAGNOSIS — R799 Abnormal finding of blood chemistry, unspecified: Secondary | ICD-10-CM | POA: Diagnosis present

## 2024-04-10 DIAGNOSIS — Z79890 Hormone replacement therapy (postmenopausal): Secondary | ICD-10-CM | POA: Insufficient documentation

## 2024-04-10 DIAGNOSIS — E871 Hypo-osmolality and hyponatremia: Secondary | ICD-10-CM | POA: Diagnosis not present

## 2024-04-10 DIAGNOSIS — Z7982 Long term (current) use of aspirin: Secondary | ICD-10-CM | POA: Insufficient documentation

## 2024-04-10 LAB — CBC
HCT: 37.7 % (ref 36.0–46.0)
Hemoglobin: 12.6 g/dL (ref 12.0–15.0)
MCH: 34 pg (ref 26.0–34.0)
MCHC: 33.4 g/dL (ref 30.0–36.0)
MCV: 101.6 fL — ABNORMAL HIGH (ref 80.0–100.0)
Platelets: 278 10*3/uL (ref 150–400)
RBC: 3.71 MIL/uL — ABNORMAL LOW (ref 3.87–5.11)
RDW: 12.8 % (ref 11.5–15.5)
WBC: 5.7 10*3/uL (ref 4.0–10.5)
nRBC: 0 % (ref 0.0–0.2)

## 2024-04-10 LAB — COMPREHENSIVE METABOLIC PANEL WITH GFR
ALT: 37 U/L (ref 0–44)
AST: 35 U/L (ref 15–41)
Albumin: 4.7 g/dL (ref 3.5–5.0)
Alkaline Phosphatase: 65 U/L (ref 38–126)
Anion gap: 11 (ref 5–15)
BUN: 10 mg/dL (ref 8–23)
CO2: 26 mmol/L (ref 22–32)
Calcium: 9.6 mg/dL (ref 8.9–10.3)
Chloride: 96 mmol/L — ABNORMAL LOW (ref 98–111)
Creatinine, Ser: 1.04 mg/dL — ABNORMAL HIGH (ref 0.44–1.00)
GFR, Estimated: 59 mL/min — ABNORMAL LOW (ref >60)
Glucose, Bld: 80 mg/dL (ref 70–99)
Potassium: 3.9 mmol/L (ref 3.5–5.1)
Sodium: 133 mmol/L — ABNORMAL LOW (ref 135–145)
Total Bilirubin: 0.3 mg/dL (ref 0.0–1.2)
Total Protein: 8 g/dL (ref 6.5–8.1)

## 2024-04-10 LAB — BASIC METABOLIC PANEL WITH GFR
BUN: 10 mg/dL (ref 6–23)
CO2: 28 meq/L (ref 19–32)
Calcium: 9.6 mg/dL (ref 8.4–10.5)
Chloride: 95 meq/L — ABNORMAL LOW (ref 96–112)
Creatinine, Ser: 0.85 mg/dL (ref 0.40–1.20)
GFR: 70.74 mL/min (ref >60.00)
Glucose, Bld: 73 mg/dL (ref 70–99)
Potassium: 4.3 meq/L (ref 3.5–5.1)
Sodium: 129 meq/L — ABNORMAL LOW (ref 135–145)

## 2024-04-10 LAB — URINALYSIS, ROUTINE W REFLEX MICROSCOPIC
Bacteria, UA: NONE SEEN
Bilirubin Urine: NEGATIVE
Glucose, UA: NEGATIVE mg/dL
Hgb urine dipstick: NEGATIVE
Ketones, ur: NEGATIVE mg/dL
Nitrite: NEGATIVE
Protein, ur: NEGATIVE mg/dL
Specific Gravity, Urine: 1.005 (ref 1.005–1.030)
pH: 6 (ref 5.0–8.0)

## 2024-04-10 LAB — SODIUM, URINE, RANDOM: Sodium, Ur: 40 mmol/L

## 2024-04-10 LAB — CREATININE, URINE, RANDOM: Creatinine, Urine: 47 mg/dL

## 2024-04-10 MED ORDER — LEVOTHYROXINE SODIUM 100 MCG PO TABS
100.0000 ug | ORAL_TABLET | Freq: Every day | ORAL | 3 refills | Status: AC
Start: 2024-04-10 — End: ?

## 2024-04-10 NOTE — Discharge Instructions (Signed)
 Thank you for coming to [facility] Emergency Department. You were seen for hyponatremia. We did an exam, labs, and imaging, and these showed sodium was improving to 133. Please increase your salt intake at home.   Please follow up with your primary care provider within 1-2 weeks for lab recheck.   Do not hesitate to return to the ED or call 911 if you experience: -Worsening symptoms -Lightheadedness, passing out -Fevers/chills -Anything else that concerns you

## 2024-04-10 NOTE — ED Triage Notes (Signed)
 PT reports she was sent from her PCP due to hyponatremia. Pt reporting she is having periods of confusion and balance issues x 1 month.

## 2024-04-10 NOTE — Progress Notes (Signed)
 Subjective:  Patient ID: Bethany Clarke, female    DOB: 11/11/1955  Age: 68 y.o. MRN: [id number]  CC:  Chief Complaint  Patient presents with   Results    Pt is doing well no questions of her own other than results     HPI Bethany Clarke presents for   Fatigue Feeling more tired past 5 months, then started with nausea, dry heaves, and vomiting starting 4 weeks ago - occurs intermittently.  No abdominal pain. Balance has been off past 4 weeks. No focal weakness. No slurred speech. No new HA. No focal weakness. Breathing is better with weight loss. Walking 5 miles per day. No new cough. Not using Breztri  inhaler - never used.  Sometimes feels confused, but not persistent.  Some muscle cramping of left leg past 4 weeks, waking her up at night. Worse cramp in left leg 2 days ago. No leg/calf swelling.  Hyponatremia and elevated TSH noted on recent labs.   Current meds of synthroid  88mcg, ASA, plavix , fluoxetine  40mg  every day, lipitor.   Lab Results  Component Value Date   NA 129 (L) 04/10/2024   K 4.3 04/10/2024   CL 95 (L) 04/10/2024   CO2 28 04/10/2024   Lab Results  Component Value Date   TSH 38.00 (H) 04/08/2024     History Patient Active Problem List   Diagnosis Date Noted   Neck pain on right side 10/04/2022   Cervico-occipital neuralgia of right side 10/04/2022   Chest pain, atypical 03/17/2016   Paresthesia 07/14/2013   Bilateral foot pain 05/01/2013   Subclavian artery stenosis, left (HCC)    Subclavian steal syndrome 03/16/2013   Subclavian arterial stenosis (HCC) 03/11/2013   Carotid stenosis 03/11/2013   Hyperlipidemia with target LDL less than 70 03/11/2013   GERD (gastroesophageal reflux disease)    Hypothyroid    Dizziness    Syncope, near    Heart burn    Hypothyroidism 05/28/2007   COPD GOLD II with reversibility  05/28/2007   Past Medical History:  Diagnosis Date   Atherosclerosis 11/14/2005   carotid US  - mild calcific non-stenotic  plaque bilaterally   Carotid bruit 02/05/2009   Doppler - R/L ICAs 0-49% diameter reductin (velocities suggest low-mid range); L subclavian 0-49%  diameter reduction   Chronic bronchitis (HCC)    growing up; not anymore (03/14/2013)   Colon polyp    hyperplastic   Costochondritis    Dizziness    Emphysema of lung (HCC)    GERD (gastroesophageal reflux disease)    H. pylori infection    Heart burn    Hemorrhoids    Hypothyroid    Lipidemia    Pneumonia ~ 2009   Subclavian artery stenosis, left (HCC)    subclavian steal physiology status post PTA and stenting   Syncope, near    Past Surgical History:  Procedure Laterality Date   ABDOMINAL HYSTERECTOMY  1999   BILATERAL UPPER EXTREMITY ANGIOGRAM N/A 03/14/2013   Procedure: BILATERAL UPPER EXTREMITY ANGIOGRAM;  Surgeon: Dorn JINNY Lesches, MD;  Location: [facility] CATH LAB;  Service: Cardiovascular;  Laterality: N/A;   BREAST BIOPSY Right 1990's   benign (03/14/2013)   CARDIAC CATHETERIZATION  03/03/2013   DILATION AND CURETTAGE OF UTERUS  1980's   KNEE ARTHROSCOPY WITH MENISCAL REPAIR Left 07/21/2021   Procedure: Arthroscopic debridement, arthroscopic debridement of bucket-handle component of lateral meniscus tear, and arthroscopically-assisted repair of lateral meniscus root tear, left knee. ;  Surgeon: Edie Norleen JINNY, MD;  Location:  [facility] ORS;  Service: Orthopedics;  Laterality: Left;   LEFT HEART CATHETERIZATION WITH CORONARY ANGIOGRAM Bilateral 03/03/2013   Procedure: LEFT HEART CATHETERIZATION WITH CORONARY ANGIOGRAM;  Surgeon: Debby DELENA Sor, MD;  Location: [facility] CATH LAB;  Service: Cardiovascular;  Laterality: Bilateral;   PERCUTANEOUS STENT INTERVENTION  03/14/2013   Procedure: PERCUTANEOUS STENT INTERVENTION;  Surgeon: Dorn JINNY Lesches, MD;  Location: [facility] CATH LAB;  Service: Cardiovascular;;   SUBCLAVIAN ARTERY STENT Left 03/14/2013    for subclavian steal/claudication (03/14/2013)   TUBAL LIGATION  1980's   No Known Allergies Prior to  Admission medications   Medication Sig Start Date End Date Taking? Authorizing Provider  albuterol  (VENTOLIN  HFA) 108 (90 Base) MCG/ACT inhaler INHALE 1-2 PUFFS INTO THE LUNGS EVERY 4 (FOUR) HOURS AS NEEDED FOR WHEEZING OR SHORTNESS OF BREATH. 05/09/23  Yes Levora Reyes SAUNDERS, MD  aspirin  EC 81 MG tablet Take 81 mg by mouth daily. Swallow whole.   Yes [provider]  atorvastatin  (LIPITOR) 40 MG tablet TAKE 1 TABLET BY MOUTH EVERY DAY 03/03/24  Yes Levora Reyes SAUNDERS, MD  Budeson-Glycopyrrol-Formoterol  (BREZTRI  AEROSPHERE) 160-9-4.8 MCG/ACT AERO Inhale 2 puffs into the lungs 2 (two) times daily. 05/18/23  Yes Levora Reyes SAUNDERS, MD  clopidogrel  (PLAVIX ) 75 MG tablet TAKE 1 TABLET BY MOUTH EVERY DAY 04/07/24  Yes Levora Reyes SAUNDERS, MD  FLUoxetine  (PROZAC ) 40 MG capsule Take 1 capsule (40 mg total) by mouth daily. 05/18/23  Yes Levora Reyes SAUNDERS, MD  SYNTHROID  88 MCG tablet TAKE 1 TABLET BY MOUTH DAILY BEFORE BREAKFAST. 03/26/24  Yes Levora Reyes SAUNDERS, MD   Social History   Socioeconomic History   Marital status: Married    Spouse name: Deward   Number of children: 3   Years of education: Not on file   Highest education level: Some college, no degree  Occupational History   Occupation: Orthoptist: [employer]  Tobacco Use   Smoking status: Former    Current packs/day: 0.00    Average packs/day: 0.5 packs/day for 40.0 years (20.0 ttl pk-yrs)    Types: Cigarettes    Start date: 02/11/1973    Quit date: 02/11/2013    Years since quitting: 11.1   Smokeless tobacco: Never  Substance and Sexual Activity   Alcohol use: No   Drug use: No   Sexual activity: Yes    Comment: number of sex partners in the last 12 months  1  Other Topics Concern   Not on file  Social History Narrative   Daily caffeine. Married. Education: [employer]. Exercise: No.      Patient is right-handed. She lives with her husband in a two level home. She drinks 2 cups of coffee a day. 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   Social Drivers of Corporate investment banker Strain: Low Risk  (08/14/2023)   Overall Financial Resource Strain (CARDIA)    Difficulty of Paying Living Expenses: Not hard at all  Food Insecurity: No Food Insecurity (08/14/2023)   Hunger Vital Sign    Worried About Running Out of Food in the Last Year: Never true    Ran Out of Food in the Last Year: Never true  Transportation Needs: No Transportation Needs (08/14/2023)   PRAPARE - Administrator, Civil Service (Medical): No    Lack of Transportation (Non-Medical): No  Physical Activity: Sufficiently Active (08/14/2023)   Exercise Vital Sign    Days of Exercise per Week:  5 days    Minutes of Exercise per Session: 60 min  Stress: No Stress Concern Present (08/14/2023)   [employer] of Occupational Health - Occupational Stress Questionnaire    Feeling of Stress : Not at all  Social Connections: Moderately Integrated (08/14/2023)   Social Connection and Isolation Panel    Frequency of Communication with Friends and Family: Not on file    Frequency of Social Gatherings with Friends and Family: More than three times a week    Attends Religious Services: Never    Database administrator or Organizations: Yes    Attends Engineer, structural: More than 4 times per year    Marital Status: Married  Catering manager Violence: Not At Risk (08/14/2023)   Humiliation, Afraid, Rape, and Kick questionnaire    Fear of Current or Ex-Partner: No    Emotionally Abused: No    Physically Abused: No    Sexually Abused: No    Review of Systems Per HPI  Objective:   Vitals:   04/10/24 1129  BP: 124/70  Pulse: 65  Temp: 97.9 F (36.6 C)  TempSrc: Temporal  SpO2: 95%  Weight: 134 lb 9.6 oz (61.1 kg)  Height: 5' 4 (1.626 m)     Physical Exam Vitals reviewed.  Constitutional:      General: She is not in acute distress.    Appearance: Normal appearance. She is  well-developed. She is not ill-appearing, toxic-appearing or diaphoretic.  HENT:     Head: Normocephalic and atraumatic.   Eyes:     Conjunctiva/sclera: Conjunctivae normal.     Pupils: Pupils are equal, round, and reactive to light.   Neck:     Vascular: No carotid bruit.   Cardiovascular:     Rate and Rhythm: Normal rate and regular rhythm.     Heart sounds: Normal heart sounds.  Pulmonary:     Effort: Pulmonary effort is normal.     Breath sounds: Normal breath sounds.  Abdominal:     Palpations: Abdomen is soft. There is no pulsatile mass.     Tenderness: There is no abdominal tenderness.   Musculoskeletal:     Right lower leg: No edema.     Left lower leg: No edema.   Skin:    General: Skin is warm and dry.   Neurological:     Mental Status: She is alert and oriented to person, place, and time.     Gait: Gait normal.     Comments: No visible tremor, no focal weakness, ambulating without assistive device.  Psychiatric:        Mood and Affect: Mood normal.        Behavior: Behavior normal.     52 minutes spent during visit, including chart review, counseling and assimilation of information, exam, discussion of plan, and chart completion.     Assessment & Plan:  KRISTYN OBYRNE is a 68 y.o. female . Hypothyroidism, unspecified type - Plan: TSH + free T4, levothyroxine  (SYNTHROID ) 100 MCG tablet  Fatigue, unspecified type - Plan: Basic metabolic panel with GFR  Hyponatremia - Plan: Basic metabolic panel with GFR  Fatigue past 5-6 months, with new onset balance difficulty, vomiting, nausea, intermittent confusion, intermittent muscle cramps, concerned that these are all symptoms of her new onset hyponatremia.  Verified with repeat testing today.  Fatigue could be related to decreased control of hypothyroidism, but with symptoms above and hyponatremia under 130, I did recommend ER evaluation as she may need to have  acute treatment and possible hospital admission.   She plans to be [facility] at [facility], ER tonight.  I did adjust her Synthroid  dose slightly to 100 mcg with planned repeat TSH in 3 to 4 weeks.  Verification of TSH was ordered today as well.   Meds ordered this encounter  Medications   levothyroxine  (SYNTHROID ) 100 MCG tablet    Sig: Take 1 tablet (100 mcg total) by mouth daily.    Dispense:  90 tablet    Refill:  3   Patient Instructions  I am concerned about your current symptoms and the low sodium seen on recent labs.  I suspect we will probably have you go to the emergency room for further treatment but I am rechecking those labs with stat repeat labs today and we will contact you later this afternoon.  I did increase the dose of your Synthroid  for now and we can recheck that lab in the next 4 to 6 weeks.  I am verifying that result with lab work today as well.   Hyponatremia Hyponatremia is when the amount of salt (sodium) in a person's blood is too low. When sodium levels are low, the cells absorb extra water, which causes them to swell. The swelling happens throughout the body, but it mostly affects the brain. What are the causes? This condition may be caused by: Certain medical conditions, such as: Heart, kidney, or liver problems. Thyroid  problems. Adrenal gland problems. Metabolic conditions, such as Addison's disease or syndrome of inappropriate antidiuresis (SIAD). Excessive vomiting, diarrhea, or sweating. Certain medicines or illegal drugs. Fluids given through an IV. What increases the risk? You are more likely to develop this condition if you: Have certain medical conditions such as heart, kidney, or liver failure. Have a medical condition that causes frequent or excessive diarrhea. Participate in intense physical activities, such as marathon running. Take certain medicines that affect the sodium and fluid balance in the blood. Some of these medicine types include: Diuretics. NSAIDs, such as ibuprofen . Some opioid  pain medicines. Some antidepressants. Some seizure prevention medicines. What are the signs or symptoms? Symptoms of this condition include: Headache. Nausea and vomiting. Being very tired (lethargic). Muscle weakness and cramping. Loss of appetite. Feeling weak or light-headed. Severe symptoms of this condition include: Confusion. Agitation. Having a rapid heart rate. Fainting. Seizures. Coma. How is this diagnosed? This condition is diagnosed based on: A physical exam. Your medical history. Tests, including: Blood tests. Urine tests. How is this treated? Treatment for this condition depends on the cause. Treatment may include: Getting fluids through an IV that is inserted into one of your veins. Medicines to correct the sodium imbalance. If medicines are causing the condition, the medicines will need to be adjusted. Limiting your water or fluid intake to get the correct sodium balance, in certain cases. Monitoring in the hospital to closely watch your symptoms for improvement. Follow these instructions at home:  Take over-the-counter and prescription medicines only as told by your health care provider. Many medicines can make this condition worse. Talk with your health care provider about any medicines that you are currently taking. Do not drink alcohol. Keep all follow-up visits. This is important. Contact a health care provider if: You develop worsening nausea, fatigue, headache, confusion, or weakness. Your symptoms go away and then return. Get help right away if: You have a seizure. You faint. You have ongoing diarrhea or vomiting. Summary Hyponatremia is when the amount of salt (sodium) in your blood is too  low. When sodium levels are low, your cells absorb extra water, which causes them to swell. The swelling happens throughout the body, but it mostly affects the brain. Treatment for this condition depends on the cause. It may include receiving IV fluids, taking  or adjusting medicines, limiting fluid intake, and monitoring in the hospital. This information is not intended to replace advice given to you by your health care provider. Make sure you discuss any questions you have with your health care provider. Document Revised: 04/12/2021 Document Reviewed: 04/12/2021 Elsevier Patient Education  2024 Elsevier Inc.    Signed,   Reyes Pines, MD [facility], [facility], [facility] 04/10/24 5:34 PM

## 2024-04-10 NOTE — Patient Instructions (Signed)
 I am concerned about your current symptoms and the low sodium seen on recent labs.  I suspect we will probably have you go to the emergency room for further treatment but I am rechecking those labs with stat repeat labs today and we will contact you later this afternoon.  I did increase the dose of your Synthroid  for now and we can recheck that lab in the next 4 to 6 weeks.  I am verifying that result with lab work today as well.   Hyponatremia Hyponatremia is when the amount of salt (sodium) in a person's blood is too low. When sodium levels are low, the cells absorb extra water, which causes them to swell. The swelling happens throughout the body, but it mostly affects the brain. What are the causes? This condition may be caused by: Certain medical conditions, such as: Heart, kidney, or liver problems. Thyroid  problems. Adrenal gland problems. Metabolic conditions, such as Addison's disease or syndrome of inappropriate antidiuresis (SIAD). Excessive vomiting, diarrhea, or sweating. Certain medicines or illegal drugs. Fluids given through an IV. What increases the risk? You are more likely to develop this condition if you: Have certain medical conditions such as heart, kidney, or liver failure. Have a medical condition that causes frequent or excessive diarrhea. Participate in intense physical activities, such as marathon running. Take certain medicines that affect the sodium and fluid balance in the blood. Some of these medicine types include: Diuretics. NSAIDs, such as ibuprofen . Some opioid pain medicines. Some antidepressants. Some seizure prevention medicines. What are the signs or symptoms? Symptoms of this condition include: Headache. Nausea and vomiting. Being very tired (lethargic). Muscle weakness and cramping. Loss of appetite. Feeling weak or light-headed. Severe symptoms of this condition include: Confusion. Agitation. Having a rapid heart  rate. Fainting. Seizures. Coma. How is this diagnosed? This condition is diagnosed based on: A physical exam. Your medical history. Tests, including: Blood tests. Urine tests. How is this treated? Treatment for this condition depends on the cause. Treatment may include: Getting fluids through an IV that is inserted into one of your veins. Medicines to correct the sodium imbalance. If medicines are causing the condition, the medicines will need to be adjusted. Limiting your water or fluid intake to get the correct sodium balance, in certain cases. Monitoring in the hospital to closely watch your symptoms for improvement. Follow these instructions at home:  Take over-the-counter and prescription medicines only as told by your health care provider. Many medicines can make this condition worse. Talk with your health care provider about any medicines that you are currently taking. Do not drink alcohol. Keep all follow-up visits. This is important. Contact a health care provider if: You develop worsening nausea, fatigue, headache, confusion, or weakness. Your symptoms go away and then return. Get help right away if: You have a seizure. You faint. You have ongoing diarrhea or vomiting. Summary Hyponatremia is when the amount of salt (sodium) in your blood is too low. When sodium levels are low, your cells absorb extra water, which causes them to swell. The swelling happens throughout the body, but it mostly affects the brain. Treatment for this condition depends on the cause. It may include receiving IV fluids, taking or adjusting medicines, limiting fluid intake, and monitoring in the hospital. This information is not intended to replace advice given to you by your health care provider. Make sure you discuss any questions you have with your health care provider. Document Revised: 04/12/2021 Document Reviewed: 04/12/2021 Elsevier Patient Education  2024 Elsevier Inc.

## 2024-04-10 NOTE — ED Provider Notes (Addendum)
 [place] EMERGENCY DEPARTMENT AT [facility] Provider Note   CSN: [id number] Arrival date & time: 04/10/24  1842     History  Chief Complaint  Patient presents with   Abnormal Lab    Bethany Clarke is a 68 y.o. female with PMH as listed below who presents with c/f abnormal lab. She went to her PCP for 6 months of fatigue, dizziness, nausea. 1 week now having nausea/vomiting. She has tolerated PO today and has no CP, SOB, cough, flu-like sxs, abdominal pain, leg swelling, urinary sxs. PCP sent her for fluids for hyponatremia found her sodium to be 128 and then 129. Patient states she doesn't use a lot of salt in her cooking at home. Otherwise feeling okay today. TSH was recently found to be high, does have h/o thyroid  disease, and PCP increased her synthroid . She has no double/blurry vision, headache, facial droop, slurred speech, vertigo, asymmetric numnbess/weakness, falls, head trauma.    Past Medical History:  Diagnosis Date   Atherosclerosis 11/14/2005   carotid US  - mild calcific non-stenotic plaque bilaterally   Carotid bruit 02/05/2009   Doppler - R/L ICAs 0-49% diameter reductin (velocities suggest low-mid range); L subclavian 0-49%  diameter reduction   Chronic bronchitis (HCC)    growing up; not anymore (03/14/2013)   Colon polyp    hyperplastic   Costochondritis    Dizziness    Emphysema of lung (HCC)    GERD (gastroesophageal reflux disease)    H. pylori infection    Heart burn    Hemorrhoids    Hypothyroid    Lipidemia    Pneumonia ~ 2009   Subclavian artery stenosis, left (HCC)    subclavian steal physiology status post PTA and stenting   Syncope, near        Home Medications Prior to Admission medications   Medication Sig Start Date End Date Taking? Authorizing Provider  albuterol  (VENTOLIN  HFA) 108 (90 Base) MCG/ACT inhaler INHALE 1-2 PUFFS INTO THE LUNGS EVERY 4 (FOUR) HOURS AS NEEDED FOR WHEEZING OR SHORTNESS OF BREATH. 05/09/23    Levora Reyes SAUNDERS, MD  aspirin  EC 81 MG tablet Take 81 mg by mouth daily. Swallow whole.    [provider]  atorvastatin  (LIPITOR) 40 MG tablet TAKE 1 TABLET BY MOUTH EVERY DAY 03/03/24   Levora Reyes SAUNDERS, MD  Budeson-Glycopyrrol-Formoterol  (BREZTRI  AEROSPHERE) 160-9-4.8 MCG/ACT AERO Inhale 2 puffs into the lungs 2 (two) times daily. 05/18/23   Levora Reyes SAUNDERS, MD  clopidogrel  (PLAVIX ) 75 MG tablet TAKE 1 TABLET BY MOUTH EVERY DAY 04/07/24   Levora Reyes SAUNDERS, MD  FLUoxetine  (PROZAC ) 40 MG capsule Take 1 capsule (40 mg total) by mouth daily. 05/18/23   Levora Reyes SAUNDERS, MD  levothyroxine  (SYNTHROID ) 100 MCG tablet Take 1 tablet (100 mcg total) by mouth daily. 04/10/24   Levora Reyes SAUNDERS, MD      Allergies    Patient has no known allergies.    Review of Systems   Review of Systems A 10 point review of systems was performed and is negative unless otherwise reported in HPI.  Physical Exam Updated Vital Signs BP (!) 159/95 (BP Location: Left Arm)   Pulse 80   Temp 97.7 F (36.5 C) (Oral)   Resp 16   SpO2 100%  Physical Exam General: Normal appearing female, lying in bed.  HEENT: PERRLA, EOMI, Sclera anicteric, MMM, trachea midline.  Cardiology: RRR Resp: Normal respiratory rate and effort Abd: Soft, non-tender, non-distended. No rebound tenderness or guarding.  GU: Deferred. MSK: No peripheral edema or signs of trauma. Extremities without deformity or TTP. No cyanosis or clubbing. Skin: warm, dry.  Neuro: A&Ox4, CNs II-XII grossly intact. MAEs. Sensation grossly intact. Normal speech. Normal gait.  Psych: Normal mood and affect.   ED Results / Procedures / Treatments   Labs (all labs ordered are listed, but only abnormal results are displayed) Labs Reviewed  COMPREHENSIVE METABOLIC PANEL WITH GFR - Abnormal; Notable for the following components:      Result Value   Sodium 133 (*)    Chloride 96 (*)    Creatinine, Ser 1.04 (*)    GFR, Estimated 59 (*)    All other  components within normal limits  CBC - Abnormal; Notable for the following components:   RBC 3.71 (*)    MCV 101.6 (*)    All other components within normal limits  URINALYSIS, ROUTINE W REFLEX MICROSCOPIC - Abnormal; Notable for the following components:   Color, Urine STRAW (*)    Leukocytes,Ua LARGE (*)    All other components within normal limits  SODIUM, URINE, RANDOM  CREATININE, URINE, RANDOM    EKG None  Radiology No results found.  Procedures Procedures    Medications Ordered in ED Medications - No data to display  ED Course/ Medical Decision Making/ A&P                          Medical Decision Making Amount and/or Complexity of Data Reviewed Labs: ordered.    This patient presents to the ED for concern of hyponatremia, this involves an extensive number of treatment options, and is a complaint that carries with it a high risk of complications and morbidity.  MDM:    Patient with several months of symptoms currently being worked up by her PCP. Sent for eval/treatment of hyponatremia. Informed the patient that her sodium improved to 133. She is offered fluids in the ED but with shared decision making states she wants to go home. I encouraged patient to increase her salt intake at home and have her sodium rechecked with PCP within 1-2 weeks. She also reports her thyroid  studies were abnormal, TSH 38, and PCP is managing that by increasing her synthroid . UA neg for UTI. No anemia or leukocytosis. No confusion; she is A&Ox4. Patient is offered nausea medicine rx at home but also declines. Patient is extremely well-appearing, HDS, tolerates PO. Encouraged to f/u with her PCP within 1-2 weeks for recheck of her labs. Discussed return precautions/discharge instructions.     Labs: I Ordered, and personally interpreted labs.  The pertinent results include:  those listed above  Additional history obtained from chart review.    Social Determinants of Health: Lives  independently  Disposition:  DC w/ discharge instructions/return precautions. All questions answered to patient's satisfaction.    Co morbidities that complicate the patient evaluation  Past Medical History:  Diagnosis Date   Atherosclerosis 11/14/2005   carotid US  - mild calcific non-stenotic plaque bilaterally   Carotid bruit 02/05/2009   Doppler - R/L ICAs 0-49% diameter reductin (velocities suggest low-mid range); L subclavian 0-49%  diameter reduction   Chronic bronchitis (HCC)    growing up; not anymore (03/14/2013)   Colon polyp    hyperplastic   Costochondritis    Dizziness    Emphysema of lung (HCC)    GERD (gastroesophageal reflux disease)    H. pylori infection    Heart burn    Hemorrhoids  Hypothyroid    Lipidemia    Pneumonia ~ 2009   Subclavian artery stenosis, left (HCC)    subclavian steal physiology status post PTA and stenting   Syncope, near      Medicines No orders of the defined types were placed in this encounter.   I have reviewed the patients home medicines and have made adjustments as needed  Problem List / ED Course: Problem List Items Addressed This Visit   None Visit Diagnoses       Hyponatremia    -  Primary            Franklyn Sid SAILOR, MD 04/10/24 2258

## 2024-04-11 LAB — T4, FREE: Free T4: 0.6 ng/dL — ABNORMAL LOW (ref 0.8–1.8)

## 2024-04-11 LAB — TSH+FREE T4: TSH W/REFLEX TO FT4: 39.23 m[IU]/L — ABNORMAL HIGH (ref 0.40–4.50)

## 2024-04-11 NOTE — Telephone Encounter (Signed)
 Replied back to patient but please call her and verify receipt.  I know it is not ideal, but if she is having worsening symptoms I would recommend repeat evaluation through the ER.

## 2024-04-11 NOTE — Telephone Encounter (Signed)
 Patient has concerns with symptoms from yesterday after leaving the hospital and states the symptoms are also when she is standing up and moving around. Patient would like a call ASAP.

## 2024-04-11 NOTE — Telephone Encounter (Signed)
 FYI Called patient to discuss Dr.Greene's note.  Patient verbalize understanding

## 2024-04-16 NOTE — Addendum Note (Signed)
 Addended by: Press Casale R on: 04/16/2024 10:21 AM   Modules accepted: Orders

## 2024-04-16 NOTE — Telephone Encounter (Signed)
 Patient follow up is in August does this need to be sooner?

## 2024-04-16 NOTE — Telephone Encounter (Signed)
 Patient would like to do another repeat, please advise

## 2024-04-16 NOTE — Telephone Encounter (Signed)
 Glad to hear she is better, but I would like to recheck with her in person and can check labs at that time.  That will give me an opportunity to check a neurologic exam, and other possible testing for the balance issues.  If she would like to have labs prior to that visit, I have ordered future labs.  Thanks

## 2024-04-17 ENCOUNTER — Telehealth: Payer: Self-pay

## 2024-04-17 NOTE — Telephone Encounter (Signed)
 Called and discussed with this patient she actually intends to have these done at the [facility] lab location so orders do not have to be changed and they do not have to be faxed

## 2024-04-17 NOTE — Telephone Encounter (Signed)
 Copied from CRM [id number]. Topic: Clinical - Request for Lab/Test Order >> Apr 17, 2024  9:01 AM Robinson H wrote: Reason for CRM: Patient wants to have labs to check her levels done at [facility], originally had an appointment scheduled for 7/14 to have levels checked but had to cancel, states she's had labs done at [facility] before.  [place] [phone]

## 2024-04-17 NOTE — Telephone Encounter (Addendum)
 Would prefer to see her sometime next week.  Looks like she does have an appointment scheduled with me on the 14th.  If she is feeling better, that is reasonable, but if any new or worsening symptoms should be seen early next week.

## 2024-04-21 NOTE — Telephone Encounter (Signed)
 Assessing schedule for openings

## 2024-04-21 NOTE — Telephone Encounter (Signed)
 Appointment changed to 04/23/2024 at 2:00pm OK per Dr Levora due to urgent need

## 2024-04-22 ENCOUNTER — Ambulatory Visit: Payer: Self-pay | Admitting: Family Medicine

## 2024-04-22 ENCOUNTER — Other Ambulatory Visit (INDEPENDENT_AMBULATORY_CARE_PROVIDER_SITE_OTHER)

## 2024-04-22 DIAGNOSIS — E871 Hypo-osmolality and hyponatremia: Secondary | ICD-10-CM

## 2024-04-22 LAB — BASIC METABOLIC PANEL WITH GFR
BUN: 9 mg/dL (ref 6–23)
CO2: 28 meq/L (ref 19–32)
Calcium: 10 mg/dL (ref 8.4–10.5)
Chloride: 92 meq/L — ABNORMAL LOW (ref 96–112)
Creatinine, Ser: 0.84 mg/dL (ref 0.40–1.20)
GFR: 71.73 mL/min (ref >60.00)
Glucose, Bld: 78 mg/dL (ref 70–99)
Potassium: 4.6 meq/L (ref 3.5–5.1)
Sodium: 127 meq/L — ABNORMAL LOW (ref 135–145)

## 2024-04-23 ENCOUNTER — Encounter: Payer: Self-pay | Admitting: Family Medicine

## 2024-04-23 ENCOUNTER — Ambulatory Visit (INDEPENDENT_AMBULATORY_CARE_PROVIDER_SITE_OTHER): Admitting: Family Medicine

## 2024-04-23 VITALS — BP 108/60 | HR 67 | Temp 98.2°F | Resp 17 | Ht 64.0 in | Wt 133.8 lb

## 2024-04-23 DIAGNOSIS — R5383 Other fatigue: Secondary | ICD-10-CM | POA: Diagnosis not present

## 2024-04-23 DIAGNOSIS — E871 Hypo-osmolality and hyponatremia: Secondary | ICD-10-CM

## 2024-04-23 DIAGNOSIS — R410 Disorientation, unspecified: Secondary | ICD-10-CM

## 2024-04-23 DIAGNOSIS — R29818 Other symptoms and signs involving the nervous system: Secondary | ICD-10-CM

## 2024-04-23 DIAGNOSIS — E039 Hypothyroidism, unspecified: Secondary | ICD-10-CM

## 2024-04-23 DIAGNOSIS — R519 Headache, unspecified: Secondary | ICD-10-CM

## 2024-04-23 DIAGNOSIS — R252 Cramp and spasm: Secondary | ICD-10-CM

## 2024-04-23 NOTE — Patient Instructions (Signed)
 I have ordered a CT scan of the head and will check some additional testing today including some 24-hour urine test to help decide on her next steps.  If any worsening symptoms, given your current reading I do recommend being seen in the ER as we discussed.  Follow-up will be determined by your lab results.  Continue to stay hydrated with electrolyte drinks such as liquid IV and liberalize salt in the diet.  Please let me know if there are questions.
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
## 2024-04-24 LAB — OSMOLALITY, URINE: Osmolality, Ur: 107 mosm/kg (ref 50–1200)

## 2024-04-24 LAB — OSMOLALITY: Osmolality: 269 mosm/kg — ABNORMAL LOW (ref 278–305)

## 2024-04-25 ENCOUNTER — Ambulatory Visit
Admission: RE | Admit: 2024-04-25 | Discharge: 2024-04-25 | Disposition: A | Discharge status: Home / Self Care | Source: Ambulatory Visit | Attending: Family Medicine | Admitting: Family Medicine

## 2024-04-25 DIAGNOSIS — R519 Headache, unspecified: Secondary | ICD-10-CM

## 2024-04-25 DIAGNOSIS — R410 Disorientation, unspecified: Secondary | ICD-10-CM

## 2024-04-25 DIAGNOSIS — R29818 Other symptoms and signs involving the nervous system: Secondary | ICD-10-CM

## 2024-04-26 LAB — SPECIMEN STATUS REPORT

## 2024-04-27 ENCOUNTER — Ambulatory Visit: Payer: Self-pay | Admitting: Family Medicine

## 2024-04-28 ENCOUNTER — Encounter: Payer: Self-pay | Admitting: Family Medicine

## 2024-04-28 ENCOUNTER — Inpatient Hospital Stay: Admitting: Family Medicine

## 2024-04-28 ENCOUNTER — Ambulatory Visit: Admitting: Family Medicine

## 2024-04-28 DIAGNOSIS — E871 Hypo-osmolality and hyponatremia: Secondary | ICD-10-CM

## 2024-04-28 NOTE — Telephone Encounter (Signed)
 Pt notes she is swelling despite doing as instructed with liquid IV, also 24 hour urine was rejected due to no frozen urine received? Please advise on this

## 2024-04-29 ENCOUNTER — Ambulatory Visit: Payer: Self-pay | Admitting: Family Medicine

## 2024-04-29 ENCOUNTER — Other Ambulatory Visit (INDEPENDENT_AMBULATORY_CARE_PROVIDER_SITE_OTHER)

## 2024-04-29 DIAGNOSIS — E871 Hypo-osmolality and hyponatremia: Secondary | ICD-10-CM | POA: Diagnosis not present

## 2024-04-29 LAB — BASIC METABOLIC PANEL WITH GFR
BUN: 8 mg/dL (ref 6–23)
CO2: 30 meq/L (ref 19–32)
Calcium: 9.4 mg/dL (ref 8.4–10.5)
Chloride: 96 meq/L (ref 96–112)
Creatinine, Ser: 0.71 mg/dL (ref 0.40–1.20)
GFR: 87.76 mL/min (ref >60.00)
Glucose, Bld: 58 mg/dL — ABNORMAL LOW (ref 70–99)
Potassium: 4 meq/L (ref 3.5–5.1)
Sodium: 130 meq/L — ABNORMAL LOW (ref 135–145)

## 2024-04-29 LAB — CREATINE, 24-HOUR URINE

## 2024-04-29 NOTE — Addendum Note (Signed)
 Addended by: RANDINE BERYL SAUNDERS on: 04/29/2024 09:21 AM   Modules accepted: Orders

## 2024-04-29 NOTE — Telephone Encounter (Signed)
 Called pt to check status and review labs form today, recent CT head which was reassuring, and sodium level today was improved from previous reading.    No answer, but I left a message that we can review results and plan further at her scheduled appointment tomorrow.

## 2024-04-29 NOTE — Telephone Encounter (Signed)
 I did receive notification yesterday that the 24-hour urine test was not able to be performed, and it looks like the ADH is still pending.  24-hour urine was reordered, but I am concerned about her symptoms that she sent over the weekend with the swelling.  Please call her this morning. If she is still having swelling, need a stat BMP for hyponatremia ordered this morning.  If any worsening symptoms needs to be seen in the ER.

## 2024-04-29 NOTE — Telephone Encounter (Signed)
 Called patient to relay Dr.Greene's note. Patient states the swelling is just in her hands. The swelling comes and goes but is there usually in the AM and later afternoon. Patient states she will come in for STAT BMP and urine collection. I scheduled patient for today 04/29/24 at 11:15 for patient to come into lab.

## 2024-04-30 ENCOUNTER — Ambulatory Visit (INDEPENDENT_AMBULATORY_CARE_PROVIDER_SITE_OTHER): Admitting: Family Medicine

## 2024-04-30 ENCOUNTER — Encounter: Payer: Self-pay | Admitting: Family Medicine

## 2024-04-30 VITALS — BP 122/60 | HR 63 | Temp 98.0°F | Ht 64.0 in | Wt 134.0 lb

## 2024-04-30 DIAGNOSIS — R5383 Other fatigue: Secondary | ICD-10-CM

## 2024-04-30 DIAGNOSIS — E039 Hypothyroidism, unspecified: Secondary | ICD-10-CM | POA: Diagnosis not present

## 2024-04-30 DIAGNOSIS — E871 Hypo-osmolality and hyponatremia: Secondary | ICD-10-CM

## 2024-04-30 LAB — ADH: ADH: <0.8 pg/mL (ref 0.0–4.7)

## 2024-04-30 NOTE — Patient Instructions (Signed)
 Repeat thyroid  test today, and may decide to go ahead and increase the dose slightly depending on what I see.  Sodium level was a little bit better yesterday and glad to hear that some of your symptoms are improving but I would like to see if the balance issues and nausea continue to improve along with fatigue.  Based on the information I have so far this could still be due to just hypothyroidism as severe hypothyroidism can affect your sodium level as well.  Plan on repeat sodium level in 5 days with a follow-up visit with me in 1 week.  Let me know if anything changes in the meantime.  Thanks for coming in today.

## 2024-04-30 NOTE — Progress Notes (Signed)
 Subjective:  Patient ID: Bethany Clarke, female    DOB: 08-11-1956  Age: 68 y.o. MRN: [id number]  CC:  Chief Complaint  Patient presents with   Follow-up    Want to discuss recent test results    HPI Bethany Clarke presents for   Hyponatremia See prior notes.  Initially seen on June 26 after labs indicating hyponatremia and uncontrolled hypothyroidism, previously had been experiencing fatigue, nausea, dry heaves and vomiting, balance issues, headache, and some mental status changes..  Given her other associated symptoms at that time with hyponatremia she was sent to ER, but repeat testing in the ER with improved sodium of 133.  We did increase her Synthroid  given elevated TSH and uncontrolled hypothyroidism.  Of note her TSH was normal in January at 3.5, elevated at 77 on June 24.  Verified with repeat testing of 39.23 on June 26.  Her Synthroid  was increased to 100 mcg/day. On follow-up July 9, overall stable symptoms with slight improvement since the ER visit although on repeat testing her sodium level was slightly lower than in the ER.  She had a nonfocal neurologic exam on the ninth, nontoxic appearance, previous head injury and some intermittent skull discomfort posteriorly along with balance difficulty and intermittent confusion, CT head was ordered and some other testing ordered for her hyponatremia including 24-hour urine, osmolality, ADH. Unfortunately her urine was not able to be processed due to sample temperature.  However looking back at other labs that have resulted, her ADH was normal at less than 0.8.  Urine osmolality normal at 107.  Prior urine sodium level was normal at 40 on June 26.  Some stinging in the hands. Slight puffiness in fingers, but no arm/leg swelling.  Still some intermittent balance issues, nausea at times.  Drinking plenty of fluids. Some liquid IV - 1 per day.  EF 56% on 01/2021 on echo.  No hx of liver cirrhosis.  No  alcohol.    History Patient Active Problem List   Diagnosis Date Noted   Neck pain on right side 10/04/2022   Cervico-occipital neuralgia of right side 10/04/2022   Pain in joints of left hand 07/17/2019   Chest pain, atypical 03/17/2016   Paresthesia 07/14/2013   Bilateral foot pain 05/01/2013   Subclavian artery stenosis, left (HCC)    Subclavian steal syndrome 03/16/2013   Subclavian arterial stenosis (HCC) 03/11/2013   Carotid stenosis 03/11/2013   Hyperlipidemia with target LDL less than 70 03/11/2013   GERD (gastroesophageal reflux disease)    Hypothyroid    Dizziness    Syncope, near    Heart burn    Hypothyroidism 05/28/2007   COPD GOLD II with reversibility  05/28/2007   Past Medical History:  Diagnosis Date   Atherosclerosis 11/14/2005   carotid US  - mild calcific non-stenotic plaque bilaterally   Carotid bruit 02/05/2009   Doppler - R/L ICAs 0-49% diameter reductin (velocities suggest low-mid range); L subclavian 0-49%  diameter reduction   Chronic bronchitis (HCC)    growing up; not anymore (03/14/2013)   Colon polyp    hyperplastic   Costochondritis    Dizziness    Emphysema of lung (HCC)    GERD (gastroesophageal reflux disease)    H. pylori infection    Heart burn    Hemorrhoids    Hypothyroid    Lipidemia    Pneumonia ~ 2009   Subclavian artery stenosis, left (HCC)    subclavian steal physiology status post PTA and stenting  Syncope, near    Past Surgical History:  Procedure Laterality Date   ABDOMINAL HYSTERECTOMY  1999   BILATERAL UPPER EXTREMITY ANGIOGRAM N/A 03/14/2013   Procedure: BILATERAL UPPER EXTREMITY ANGIOGRAM;  Surgeon: Dorn JINNY Lesches, MD;  Location: [facility] CATH LAB;  Service: Cardiovascular;  Laterality: N/A;   BREAST BIOPSY Right 1990's   benign (03/14/2013)   CARDIAC CATHETERIZATION  03/03/2013   DILATION AND CURETTAGE OF UTERUS  1980's   KNEE ARTHROSCOPY WITH MENISCAL REPAIR Left 07/21/2021   Procedure: Arthroscopic debridement,  arthroscopic debridement of bucket-handle component of lateral meniscus tear, and arthroscopically-assisted repair of lateral meniscus root tear, left knee. ;  Surgeon: Edie Norleen JINNY, MD;  Location: [facility] ORS;  Service: Orthopedics;  Laterality: Left;   LEFT HEART CATHETERIZATION WITH CORONARY ANGIOGRAM Bilateral 03/03/2013   Procedure: LEFT HEART CATHETERIZATION WITH CORONARY ANGIOGRAM;  Surgeon: Debby DELENA Sor, MD;  Location: [facility] CATH LAB;  Service: Cardiovascular;  Laterality: Bilateral;   PERCUTANEOUS STENT INTERVENTION  03/14/2013   Procedure: PERCUTANEOUS STENT INTERVENTION;  Surgeon: Dorn JINNY Lesches, MD;  Location: [facility] CATH LAB;  Service: Cardiovascular;;   SUBCLAVIAN ARTERY STENT Left 03/14/2013    for subclavian steal/claudication (03/14/2013)   TUBAL LIGATION  1980's   No Known Allergies Prior to Admission medications   Medication Sig Start Date End Date Taking? Authorizing Provider  albuterol  (VENTOLIN  HFA) 108 (90 Base) MCG/ACT inhaler INHALE 1-2 PUFFS INTO THE LUNGS EVERY 4 (FOUR) HOURS AS NEEDED FOR WHEEZING OR SHORTNESS OF BREATH. 05/09/23  Yes Levora Reyes SAUNDERS, MD  aspirin  EC 81 MG tablet Take 81 mg by mouth daily. Swallow whole.   Yes [provider]  atorvastatin  (LIPITOR) 40 MG tablet TAKE 1 TABLET BY MOUTH EVERY DAY 03/03/24  Yes Levora Reyes SAUNDERS, MD  Budeson-Glycopyrrol-Formoterol  (BREZTRI  AEROSPHERE) 160-9-4.8 MCG/ACT AERO Inhale 2 puffs into the lungs 2 (two) times daily. 05/18/23  Yes Levora Reyes SAUNDERS, MD  clopidogrel  (PLAVIX ) 75 MG tablet TAKE 1 TABLET BY MOUTH EVERY DAY 04/07/24  Yes Levora Reyes SAUNDERS, MD  FLUoxetine  (PROZAC ) 40 MG capsule Take 1 capsule (40 mg total) by mouth daily. 05/18/23  Yes Levora Reyes SAUNDERS, MD  levothyroxine  (SYNTHROID ) 100 MCG tablet Take 1 tablet (100 mcg total) by mouth daily. 04/10/24  Yes Levora Reyes SAUNDERS, MD   Social History   Socioeconomic History   Marital status: Married    Spouse name: Deward   Number of children: 3   Years of  education: Not on file   Highest education level: Some college, no degree  Occupational History   Occupation: Orthoptist: [employer]  Tobacco Use   Smoking status: Former    Current packs/day: 0.00    Average packs/day: 0.5 packs/day for 40.0 years (20.0 ttl pk-yrs)    Types: Cigarettes    Start date: 02/11/1973    Quit date: 02/11/2013    Years since quitting: 11.2   Smokeless tobacco: Never  Substance and Sexual Activity   Alcohol use: No   Drug use: No   Sexual activity: Yes    Comment: number of sex partners in the last 12 months  1  Other Topics Concern   Not on file  Social History Narrative   Daily caffeine. Married. Education: [employer]. Exercise: No.      Patient is right-handed. She lives with her husband in a two level home. She drinks 2 cups of coffee a day. 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  Social Drivers of Corporate investment banker Strain: Low Risk  (08/14/2023)   Overall Financial Resource Strain (CARDIA)    Difficulty of Paying Living Expenses: Not hard at all  Food Insecurity: No Food Insecurity (08/14/2023)   Hunger Vital Sign    Worried About Running Out of Food in the Last Year: Never true    Ran Out of Food in the Last Year: Never true  Transportation Needs: No Transportation Needs (08/14/2023)   PRAPARE - Administrator, Civil Service (Medical): No    Lack of Transportation (Non-Medical): No  Physical Activity: Sufficiently Active (08/14/2023)   Exercise Vital Sign    Days of Exercise per Week: 5 days    Minutes of Exercise per Session: 60 min  Stress: No Stress Concern Present (08/14/2023)   [employer] of Occupational Health - Occupational Stress Questionnaire    Feeling of Stress : Not at all  Social Connections: Moderately Integrated (08/14/2023)   Social Connection and Isolation Panel    Frequency of Communication with Friends and Family: Not on file    Frequency of Social  Gatherings with Friends and Family: More than three times a week    Attends Religious Services: Never    Database administrator or Organizations: Yes    Attends Engineer, structural: More than 4 times per year    Marital Status: Married  Catering manager Violence: Not At Risk (08/14/2023)   Humiliation, Afraid, Rape, and Kick questionnaire    Fear of Current or Ex-Partner: No    Emotionally Abused: No    Physically Abused: No    Sexually Abused: No    Review of Systems Per HPI  Objective:   Vitals:   04/30/24 1438  BP: 122/60  Pulse: 63  Temp: 98 F (36.7 C)  SpO2: 95%  Weight: 134 lb (60.8 kg)  Height: 5' 4 (1.626 m)     Physical Exam Vitals reviewed.  Constitutional:      General: She is not in acute distress.    Appearance: Normal appearance. She is well-developed. She is not ill-appearing, toxic-appearing or diaphoretic.  HENT:     Head: Normocephalic and atraumatic.  Eyes:     Conjunctiva/sclera: Conjunctivae normal.     Pupils: Pupils are equal, round, and reactive to light.  Neck:     Vascular: No carotid bruit.  Cardiovascular:     Rate and Rhythm: Normal rate and regular rhythm.     Heart sounds: Normal heart sounds.  Pulmonary:     Effort: Pulmonary effort is normal.     Breath sounds: Normal breath sounds.  Abdominal:     Palpations: Abdomen is soft. There is no pulsatile mass.     Tenderness: There is no abdominal tenderness.  Musculoskeletal:     Right lower leg: No edema.     Left lower leg: No edema.     Comments: No hand or leg edema appreciated.  Skin:    General: Skin is warm and dry.  Neurological:     General: No focal deficit present.     Mental Status: She is alert and oriented to person, place, and time.  Psychiatric:        Mood and Affect: Mood normal.        Behavior: Behavior normal.     Assessment & Plan:  Bethany Clarke is a 68 y.o. female . Hypothyroidism, unspecified type - Plan: TSH + free T4,  T3  Hyponatremia - Plan:  Basic metabolic panel with GFR  Fatigue, unspecified type - Plan: TSH + free T4, T3  Improved sodium on recent testing, symptoms are improving.  Nonfocal neuroexam, no new symptoms.  Looking back at her previous labs, hypothyroidism may be a primary contributor.  On further review of osmolality and urine osmolality, may also be taking too much free water.  Repeat testing on thyroid  shows improvement with normal free T4, T3, will hold course for now on meds with repeat testing in approximately 1 month.  Message sent to patient to decrease free water slightly at this time, and plan for recheck in 1 week.  No orders of the defined types were placed in this encounter.  Patient Instructions  Repeat thyroid  test today, and may decide to go ahead and increase the dose slightly depending on what I see.  Sodium level was a little bit better yesterday and glad to hear that some of your symptoms are improving but I would like to see if the balance issues and nausea continue to improve along with fatigue.  Based on the information I have so far this could still be due to just hypothyroidism as severe hypothyroidism can affect your sodium level as well.  Plan on repeat sodium level in 5 days with a follow-up visit with me in 1 week.  Let me know if anything changes in the meantime.  Thanks for coming in today.     Signed,   Reyes Pines, MD  Primary Care, [facility] 04/30/24 3:34 PM

## 2024-05-01 LAB — T3: T3, Total: 80 ng/dL (ref 76–181)

## 2024-05-01 LAB — T4, FREE: Free T4: 1.1 ng/dL (ref 0.8–1.8)

## 2024-05-01 LAB — TSH+FREE T4: TSH W/REFLEX TO FT4: 13.77 m[IU]/L — ABNORMAL HIGH (ref 0.40–4.50)

## 2024-05-01 NOTE — Telephone Encounter (Signed)
Please see message below from patient...

## 2024-05-02 ENCOUNTER — Ambulatory Visit: Payer: Self-pay | Admitting: Family Medicine

## 2024-05-03 LAB — CREATININE CLEARANCE, URINE, 24 HOUR
Creatinine Clearance: 67 mL/min — ABNORMAL LOW (ref 88–128)
Creatinine, 24H Ur: 825 mg/(24.h) (ref 800–1800)
Creatinine, Ser: 0.85 mg/dL (ref 0.57–1.00)
Creatinine, Urine: 27.5 mg/dL
eGFR: 75 mL/min/1.73 (ref >59)

## 2024-05-07 ENCOUNTER — Other Ambulatory Visit (INDEPENDENT_AMBULATORY_CARE_PROVIDER_SITE_OTHER)

## 2024-05-07 DIAGNOSIS — E871 Hypo-osmolality and hyponatremia: Secondary | ICD-10-CM | POA: Diagnosis not present

## 2024-05-07 LAB — BASIC METABOLIC PANEL WITH GFR
BUN: 15 mg/dL (ref 6–23)
CO2: 27 meq/L (ref 19–32)
Calcium: 9.7 mg/dL (ref 8.4–10.5)
Chloride: 98 meq/L (ref 96–112)
Creatinine, Ser: 0.78 mg/dL (ref 0.40–1.20)
GFR: 78.38 mL/min (ref >60.00)
Glucose, Bld: 98 mg/dL (ref 70–99)
Potassium: 4.2 meq/L (ref 3.5–5.1)
Sodium: 133 meq/L — ABNORMAL LOW (ref 135–145)

## 2024-05-09 ENCOUNTER — Encounter: Payer: Self-pay | Admitting: Family Medicine

## 2024-05-09 ENCOUNTER — Ambulatory Visit (INDEPENDENT_AMBULATORY_CARE_PROVIDER_SITE_OTHER): Admitting: Family Medicine

## 2024-05-09 VITALS — BP 126/62 | HR 67 | Temp 98.2°F | Ht 64.0 in | Wt 134.8 lb

## 2024-05-09 DIAGNOSIS — E039 Hypothyroidism, unspecified: Secondary | ICD-10-CM | POA: Diagnosis not present

## 2024-05-09 DIAGNOSIS — R1110 Vomiting, unspecified: Secondary | ICD-10-CM | POA: Diagnosis not present

## 2024-05-09 DIAGNOSIS — E162 Hypoglycemia, unspecified: Secondary | ICD-10-CM

## 2024-05-09 DIAGNOSIS — R5383 Other fatigue: Secondary | ICD-10-CM | POA: Diagnosis not present

## 2024-05-09 DIAGNOSIS — R110 Nausea: Secondary | ICD-10-CM

## 2024-05-09 DIAGNOSIS — E871 Hypo-osmolality and hyponatremia: Secondary | ICD-10-CM

## 2024-05-09 LAB — LIPASE: Lipase: 74 U/L — ABNORMAL HIGH (ref 11.0–59.0)

## 2024-05-09 LAB — HEMOGLOBIN A1C: Hgb A1c MFr Bld: 5.6 % (ref 4.6–6.5)

## 2024-05-09 NOTE — Patient Instructions (Addendum)
 Start having some form of breakfast - oatmeal, granola bar, protein drink as examples. 3 meals per day with snack in between.  If the fatigue, nausea, dry heaves do not improve, will need to discuss with gastroenterology. Let me know.  Return to the clinic or go to the nearest emergency room if any of your symptoms worsen or new symptoms occur.

## 2024-05-09 NOTE — Progress Notes (Signed)
 Subjective:  Patient ID: Bethany Clarke, female    DOB: Apr 12, 1956  Age: 68 y.o. MRN: [id number]  CC:  Chief Complaint  Patient presents with   Hypothyroidism    Follow up; doing alright, no concerns    HPI Bethany Clarke presents for   Hyponatremia See prior notes.  Suspected because of untreated hypothyroidism as her numbers have improved with treatment of thyroid .  On review of her osmolality and urine osmolality, also possibly taking too much free water, and recommended her to cut back on some of free water intake since last visit. Recent testing as below, stable sodium, only mild hyponatremia at this time.  Tolerating higher dose of Synthroid , TSH was improving on most recent labs with normal T4, T3, decided to remain on same doses with repeat testing in approximately 1 month.  Prior fatigue, nausea, balance issues discussed.  Cut back on free water. Had been feeling better, but then intermittent symptoms of lightheadedness, nausea this week. Comes and goes., no focal weakness.  No HA, no CP/palpitations. Still some persistent fatigue.   Glucose 58 on labs 7/15, 98 on most recent labs.  Fasting on 7/15 labs. Has home glucometer - 70's in the afternoon. Has not checked in the morning.  Has not eaten yet today.  Eating 2 meals per day. No breakfast - takes meds in am at 530. No food until 10am - banana or oatmeal bar. Salad or soup at lunch, sometime sandwich. then protein and fruit at supper. Some pasta, carbs at dinner or lunch.  Nausea if waiting too long to eat, but then tired after meals, and dry heaving during the day. Had been down to 2 times per day, now back up to 5 times per day.  Balance is better.  No abd pain. Weight between 130-135 range.   Wt Readings from Last 3 Encounters:  05/09/24 134 lb 12.8 oz (61.1 kg)  04/30/24 134 lb (60.8 kg)  04/23/24 133 lb 12.8 oz (60.7 kg)    Lab Results  Component Value Date   TSH 38.00 (H) 04/08/2024   Lab Results   Component Value Date   NA 133 (L) 05/07/2024   K 4.2 05/07/2024   CL 98 05/07/2024   CO2 27 05/07/2024    History Patient Active Problem List   Diagnosis Date Noted   Neck pain on right side 10/04/2022   Cervico-occipital neuralgia of right side 10/04/2022   Pain in joints of left hand 07/17/2019   Chest pain, atypical 03/17/2016   Paresthesia 07/14/2013   Bilateral foot pain 05/01/2013   Subclavian artery stenosis, left (HCC)    Subclavian steal syndrome 03/16/2013   Subclavian arterial stenosis (HCC) 03/11/2013   Carotid stenosis 03/11/2013   Hyperlipidemia with target LDL less than 70 03/11/2013   GERD (gastroesophageal reflux disease)    Hypothyroid    Dizziness    Syncope, near    Heart burn    Hypothyroidism 05/28/2007   COPD GOLD II with reversibility  05/28/2007   Past Medical History:  Diagnosis Date   Atherosclerosis 11/14/2005   carotid US  - mild calcific non-stenotic plaque bilaterally   Carotid bruit 02/05/2009   Doppler - R/L ICAs 0-49% diameter reductin (velocities suggest low-mid range); L subclavian 0-49%  diameter reduction   Chronic bronchitis (HCC)    growing up; not anymore (03/14/2013)   Colon polyp    hyperplastic   Costochondritis    Dizziness    Emphysema of lung (HCC)  GERD (gastroesophageal reflux disease)    H. pylori infection    Heart burn    Hemorrhoids    Hypothyroid    Lipidemia    Pneumonia ~ 2009   Subclavian artery stenosis, left (HCC)    subclavian steal physiology status post PTA and stenting   Syncope, near    Past Surgical History:  Procedure Laterality Date   ABDOMINAL HYSTERECTOMY  1999   BILATERAL UPPER EXTREMITY ANGIOGRAM N/A 03/14/2013   Procedure: BILATERAL UPPER EXTREMITY ANGIOGRAM;  Surgeon: Dorn JINNY Lesches, MD;  Location: [facility] CATH LAB;  Service: Cardiovascular;  Laterality: N/A;   BREAST BIOPSY Right 1990's   benign (03/14/2013)   CARDIAC CATHETERIZATION  03/03/2013   DILATION AND CURETTAGE OF UTERUS   1980's   KNEE ARTHROSCOPY WITH MENISCAL REPAIR Left 07/21/2021   Procedure: Arthroscopic debridement, arthroscopic debridement of bucket-handle component of lateral meniscus tear, and arthroscopically-assisted repair of lateral meniscus root tear, left knee. ;  Surgeon: Edie Norleen JINNY, MD;  Location: [facility] ORS;  Service: Orthopedics;  Laterality: Left;   LEFT HEART CATHETERIZATION WITH CORONARY ANGIOGRAM Bilateral 03/03/2013   Procedure: LEFT HEART CATHETERIZATION WITH CORONARY ANGIOGRAM;  Surgeon: Debby DELENA Sor, MD;  Location: [facility] CATH LAB;  Service: Cardiovascular;  Laterality: Bilateral;   PERCUTANEOUS STENT INTERVENTION  03/14/2013   Procedure: PERCUTANEOUS STENT INTERVENTION;  Surgeon: Dorn JINNY Lesches, MD;  Location: [facility] CATH LAB;  Service: Cardiovascular;;   SUBCLAVIAN ARTERY STENT Left 03/14/2013    for subclavian steal/claudication (03/14/2013)   TUBAL LIGATION  1980's   No Known Allergies Prior to Admission medications   Medication Sig Start Date End Date Taking? Authorizing Provider  albuterol  (VENTOLIN  HFA) 108 (90 Base) MCG/ACT inhaler INHALE 1-2 PUFFS INTO THE LUNGS EVERY 4 (FOUR) HOURS AS NEEDED FOR WHEEZING OR SHORTNESS OF BREATH. 05/09/23  Yes Levora Reyes SAUNDERS, MD  aspirin  EC 81 MG tablet Take 81 mg by mouth daily. Swallow whole.   Yes [provider]  atorvastatin  (LIPITOR) 40 MG tablet TAKE 1 TABLET BY MOUTH EVERY DAY 03/03/24  Yes Levora Reyes SAUNDERS, MD  Budeson-Glycopyrrol-Formoterol  (BREZTRI  AEROSPHERE) 160-9-4.8 MCG/ACT AERO Inhale 2 puffs into the lungs 2 (two) times daily. 05/18/23  Yes Levora Reyes SAUNDERS, MD  clopidogrel  (PLAVIX ) 75 MG tablet TAKE 1 TABLET BY MOUTH EVERY DAY 04/07/24  Yes Levora Reyes SAUNDERS, MD  FLUoxetine  (PROZAC ) 40 MG capsule Take 1 capsule (40 mg total) by mouth daily. 05/18/23  Yes Levora Reyes SAUNDERS, MD  levothyroxine  (SYNTHROID ) 100 MCG tablet Take 1 tablet (100 mcg total) by mouth daily. 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 Married. Education: [employer]. Exercise: No.      Patient is right-handed. She lives with her husband in a two level home. She drinks 2 cups of coffee a day. 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   Social Drivers of Corporate investment banker Strain: Low Risk  (08/14/2023)   Overall Financial Resource Strain (CARDIA)    Difficulty of Paying Living Expenses: Not hard at all  Food Insecurity: No Food Insecurity (08/14/2023)   Hunger Vital Sign    Worried About Running Out of Food in the Last Year: Never true    Ran Out of Food in the Last Year: Never true  Transportation Needs: No Transportation Needs (08/14/2023)   PRAPARE - Administrator, Civil Service (Medical): No    Lack of Transportation (Non-Medical): No  Physical Activity: Sufficiently Active (08/14/2023)   Exercise Vital Sign    Days of Exercise per Week: 5 days    Minutes of Exercise per Session: 60 min  Stress: No Stress Concern Present (08/14/2023)   [employer] of Occupational Health - Occupational Stress Questionnaire    Feeling of Stress : Not at all  Social Connections: Moderately Integrated (08/14/2023)   Social Connection and  Isolation Panel    Frequency of Communication with Friends and Family: Not on file    Frequency of Social Gatherings with Friends and Family: More than three times a week    Attends Religious Services: Never    Database administrator or Organizations: Yes    Attends Engineer, structural: More than 4 times per year    Marital Status: Married  Catering manager Violence: Not At Risk (08/14/2023)   Humiliation, Afraid, Rape, and Kick questionnaire    Fear of Current or Ex-Partner: No    Emotionally Abused: No    Physically Abused: No    Sexually Abused: No    Review of Systems Per HPI.   Objective:   Vitals:   05/09/24 1053  BP: 126/62  Pulse: 67  Temp: 98.2 F (36.8 C)  SpO2: 95%  Weight: 134 lb 12.8 oz (61.1 kg)  Height: 5' 4 (1.626 m)     Physical Exam Vitals reviewed.  Constitutional:      Appearance: Normal appearance. She is well-developed.  HENT:     Head: Normocephalic and atraumatic.  Eyes:     Conjunctiva/sclera: Conjunctivae normal.     Pupils: Pupils are equal, round, and reactive to light.  Neck:     Vascular: No carotid bruit.  Cardiovascular:     Rate and Rhythm: Normal rate and regular rhythm.     Heart sounds: Normal heart sounds.  Pulmonary:     Effort: Pulmonary effort is normal.     Breath sounds: Normal breath sounds.  Abdominal:     General: There is no distension.     Palpations: Abdomen is soft. There is no mass or pulsatile mass.     Tenderness: There is no abdominal tenderness. There is no guarding or rebound.  Musculoskeletal:     Right lower leg: No edema.     Left lower leg: No edema.  Skin:    General: Skin is warm and dry.  Neurological:     Mental Status: She is alert and oriented to person, place, and time.  Psychiatric:        Mood and Affect: Mood normal.  Behavior: Behavior normal.      Assessment & Plan:  Bethany Clarke is a 68 y.o. female . Hypothyroidism, unspecified type - Plan: TSH  -  Improved reading on most recent testing including T3, T4, decided against changes but will recheck TSH in the next few weeks.  Adjust plan accordingly at that time.  Hyponatremia - Plan: Basic metabolic panel with GFR  - Improving, symptomatically improved, continue to monitor with repeat labs next few weeks.  Fatigue, unspecified type Nausea - Plan: Lipase Dry heaves - Plan: Lipase Hypoglycemia - Plan: Hemoglobin A1c, Insulin  and C-Peptide  - Check labs, including A1c insulin , C-peptide and lipase.  Advised to increase frequency of meals including not skipping breakfast, and RTC precautions if not improving.  If persistent nausea or vomiting, dry heaves, will need to see gastroenterology.  She will advise me if above changes do not improve symptoms.   No orders of the defined types were placed in this encounter.  Patient Instructions  Start having some form of breakfast - oatmeal, granola bar, protein drink as examples. 3 meals per day with snack in between.  If the fatigue, nausea, dry heaves do not improve, will need to discuss with gastroenterology. Let me know.  Return to the clinic or go to the nearest emergency room if any of your symptoms worsen or new symptoms occur.       Signed,   Reyes Pines, MD [facility], [facility] 05/09/24 11:36 AM

## 2024-05-10 LAB — INSULIN AND C-PEPTIDE, SERUM
C-Peptide: 3 ng/mL (ref 1.1–4.4)
INSULIN: 27.2 u[IU]/mL — ABNORMAL HIGH (ref 2.6–24.9)

## 2024-05-13 ENCOUNTER — Ambulatory Visit: Payer: Self-pay

## 2024-05-13 ENCOUNTER — Ambulatory Visit: Payer: Self-pay | Admitting: Family Medicine

## 2024-05-13 ENCOUNTER — Other Ambulatory Visit: Payer: Self-pay | Admitting: Family Medicine

## 2024-05-13 DIAGNOSIS — R1110 Vomiting, unspecified: Secondary | ICD-10-CM

## 2024-05-13 DIAGNOSIS — R748 Abnormal levels of other serum enzymes: Secondary | ICD-10-CM

## 2024-05-13 DIAGNOSIS — R110 Nausea: Secondary | ICD-10-CM

## 2024-05-13 NOTE — Telephone Encounter (Signed)
 FYI Only or Action Required?: FYI only for provider.  Patient was last seen in primary care on 05/09/2024 by Levora Reyes SAUNDERS, MD.  Called Nurse Triage reporting [place].  Symptoms began yesterday.  Interventions attempted: Nothing.  Symptoms are: gradually worsening. Lump beneath skin on upper arm  - noticed today. Shoulder pain 1 month.  Triage Disposition: See Physician Within 24 Hours  Patient/caregiver understands and will follow disposition?: Yes                  Copied from CRM #[id number]. Topic: Clinical - Red Word Triage >> May 13, 2024 11:06 AM Suzen RAMAN wrote: Red Word that prompted transfer to Nurse Triage: Painful knot on upper arm that has grown Reason for Disposition  [1] Swelling is painful to touch AND [2] no fever  Answer Assessment - Initial Assessment Questions 1. APPEARANCE of SWELLING: What does it look like?     Lump on upper right arm 2. SIZE: How large is the swelling? (e.g., inches, cm; or compare to size of pinhead, tip of pen, eraser, coin, pea, grape, ping pong ball)      Unsure - lump is a little moveable 3. LOCATION: Where is the swelling located?     Upper right arm 4. ONSET: When did the swelling start?     Noticed today 5. COLOR: What color is it? Is there more than one color?     Normal  6. PAIN: Is there any pain? If Yes, ask: How bad is the pain? (Scale 1-10; or mild, moderate, severe)       yes 7. ITCH: Does it itch? If Yes, ask: How bad is the itch?      no 8. CAUSE: What do you think caused the swelling?     unsure 9 OTHER SYMPTOMS: Do you have any other symptoms? (e.g., fever)     Shoulder was hurting for about 1 month.  Protocols used: Skin Lump or Localized Swelling-A-AH

## 2024-05-13 NOTE — Telephone Encounter (Signed)
 FYI

## 2024-05-13 NOTE — Progress Notes (Signed)
 See lab results, CT abdomen ordered given dry heaves, nausea, elevated lipase.

## 2024-05-13 NOTE — Telephone Encounter (Signed)
 Patient has appointment tomorrow 05/14/24

## 2024-05-14 ENCOUNTER — Ambulatory Visit: Admitting: Family Medicine

## 2024-05-16 ENCOUNTER — Ambulatory Visit
Admission: RE | Admit: 2024-05-16 | Discharge: 2024-05-16 | Disposition: A | Discharge status: Home / Self Care | Source: Ambulatory Visit | Attending: Family Medicine | Admitting: Family Medicine

## 2024-05-16 DIAGNOSIS — R748 Abnormal levels of other serum enzymes: Secondary | ICD-10-CM

## 2024-05-16 DIAGNOSIS — R1110 Vomiting, unspecified: Secondary | ICD-10-CM

## 2024-05-16 DIAGNOSIS — R110 Nausea: Secondary | ICD-10-CM

## 2024-05-16 MED ORDER — IOPAMIDOL (ISOVUE-300) INJECTION 61%
100.0000 mL | Freq: Once | INTRAVENOUS | Status: AC | PRN
  Administered 2024-05-16: 100 mL via INTRAVENOUS

## 2024-05-19 NOTE — Telephone Encounter (Signed)
 Patient is wanting a call about her CT results. I don't see that they are back yet. Sending as FYI

## 2024-05-20 NOTE — Telephone Encounter (Signed)
 CT completed on 8/1. Results are not yet available from radiology. Can staff please call radiology to ask for a result?

## 2024-05-20 NOTE — Telephone Encounter (Signed)
 Called [facility] and they said that it will be another day or two before the results are back. I will let patient know.

## 2024-05-22 ENCOUNTER — Ambulatory Visit: Payer: Self-pay | Admitting: Family Medicine

## 2024-05-22 DIAGNOSIS — R1110 Vomiting, unspecified: Secondary | ICD-10-CM

## 2024-05-22 DIAGNOSIS — K838 Other specified diseases of biliary tract: Secondary | ICD-10-CM

## 2024-05-22 DIAGNOSIS — E871 Hypo-osmolality and hyponatremia: Secondary | ICD-10-CM

## 2024-05-22 DIAGNOSIS — R748 Abnormal levels of other serum enzymes: Secondary | ICD-10-CM

## 2024-05-22 DIAGNOSIS — R110 Nausea: Secondary | ICD-10-CM

## 2024-05-22 NOTE — Telephone Encounter (Signed)
 Called patient to let her know of plan for MRI.  Patient told me that she spoke to Dr. Levora about the plan. She has not further questions at this time. I advised patient to disregard my MyChart message.

## 2024-05-22 NOTE — Telephone Encounter (Signed)
 Dr. Albertus replied to your message:   Thanks If followup with us  is needed she is a Dr. Abran patient (just FYI for our schedulers) I would also repeat hepatic function panel given it has been 6 weeks since last done (late June), unless a more recent test I am missing Thanks JMP  Please advise

## 2024-05-22 NOTE — Telephone Encounter (Signed)
 Patient has upcoming lab visit Monday, please change the BMP that is scheduled to a CMP, I placed that order today.  Thanks

## 2024-05-22 NOTE — Addendum Note (Signed)
 Addended by: Jalaine Riggenbach R on: 05/22/2024 04:58 PM   Modules accepted: Orders

## 2024-05-26 ENCOUNTER — Other Ambulatory Visit (INDEPENDENT_AMBULATORY_CARE_PROVIDER_SITE_OTHER)

## 2024-05-26 DIAGNOSIS — R748 Abnormal levels of other serum enzymes: Secondary | ICD-10-CM

## 2024-05-26 DIAGNOSIS — R1110 Vomiting, unspecified: Secondary | ICD-10-CM

## 2024-05-26 DIAGNOSIS — R110 Nausea: Secondary | ICD-10-CM | POA: Diagnosis not present

## 2024-05-26 DIAGNOSIS — E871 Hypo-osmolality and hyponatremia: Secondary | ICD-10-CM

## 2024-05-26 DIAGNOSIS — K838 Other specified diseases of biliary tract: Secondary | ICD-10-CM | POA: Diagnosis not present

## 2024-05-26 LAB — COMPREHENSIVE METABOLIC PANEL WITH GFR
ALT: 31 U/L (ref 0–35)
AST: 26 U/L (ref 0–37)
Albumin: 4.6 g/dL (ref 3.5–5.2)
Alkaline Phosphatase: 65 U/L (ref 39–117)
BUN: 10 mg/dL (ref 6–23)
CO2: 30 meq/L (ref 19–32)
Calcium: 9.5 mg/dL (ref 8.4–10.5)
Chloride: 96 meq/L (ref 96–112)
Creatinine, Ser: 0.73 mg/dL (ref 0.40–1.20)
GFR: 84.84 mL/min (ref >60.00)
Glucose, Bld: 73 mg/dL (ref 70–99)
Potassium: 3.9 meq/L (ref 3.5–5.1)
Sodium: 132 meq/L — ABNORMAL LOW (ref 135–145)
Total Bilirubin: 0.3 mg/dL (ref 0.2–1.2)
Total Protein: 7.2 g/dL (ref 6.0–8.3)

## 2024-05-27 ENCOUNTER — Other Ambulatory Visit

## 2024-05-27 ENCOUNTER — Ambulatory Visit: Payer: Self-pay | Admitting: Family Medicine

## 2024-05-27 DIAGNOSIS — E871 Hypo-osmolality and hyponatremia: Secondary | ICD-10-CM

## 2024-05-27 DIAGNOSIS — E039 Hypothyroidism, unspecified: Secondary | ICD-10-CM

## 2024-05-27 LAB — BASIC METABOLIC PANEL WITH GFR
BUN: 13 mg/dL (ref 6–23)
CO2: 27 meq/L (ref 19–32)
Calcium: 9.5 mg/dL (ref 8.4–10.5)
Chloride: 99 meq/L (ref 96–112)
Creatinine, Ser: 0.69 mg/dL (ref 0.40–1.20)
GFR: 89.53 mL/min (ref >60.00)
Glucose, Bld: 89 mg/dL (ref 70–99)
Potassium: 3.9 meq/L (ref 3.5–5.1)
Sodium: 135 meq/L (ref 135–145)

## 2024-05-27 LAB — TSH: TSH: 10.14 u[IU]/mL — ABNORMAL HIGH (ref 0.35–5.50)

## 2024-05-28 ENCOUNTER — Ambulatory Visit (INDEPENDENT_AMBULATORY_CARE_PROVIDER_SITE_OTHER): Payer: BLUE CROSS/BLUE SHIELD | Admitting: Family Medicine

## 2024-05-28 VITALS — BP 110/60 | HR 62 | Wt 130.0 lb

## 2024-05-28 DIAGNOSIS — R200 Anesthesia of skin: Secondary | ICD-10-CM

## 2024-05-28 DIAGNOSIS — K838 Other specified diseases of biliary tract: Secondary | ICD-10-CM

## 2024-05-28 DIAGNOSIS — R2231 Localized swelling, mass and lump, right upper limb: Secondary | ICD-10-CM

## 2024-05-28 DIAGNOSIS — R110 Nausea: Secondary | ICD-10-CM | POA: Diagnosis not present

## 2024-05-28 DIAGNOSIS — R202 Paresthesia of skin: Secondary | ICD-10-CM

## 2024-05-28 DIAGNOSIS — E039 Hypothyroidism, unspecified: Secondary | ICD-10-CM | POA: Diagnosis not present

## 2024-05-28 DIAGNOSIS — E871 Hypo-osmolality and hyponatremia: Secondary | ICD-10-CM

## 2024-05-28 NOTE — Patient Instructions (Addendum)
 Colace over the counter is an option for stool softener if needed.  Recent sodium level looked good.  Repeat thyroid  test in 1 month and if still elevated, will increase dose of meds to 112mcg.  Try to modify your activity including with use of mouse or any repetitive activity of the right wrist for now to see if that helps with the tingling symptoms which could be your radial nerve.  If that is not getting better in the next few weeks, let me know.  Bump on the upper arm could be a painful lipoma or possible soft tissue irritation from previous injury.  Lets watch that for now and as long as it continues to improve no new treatment or imaging.  Keep me posted.  I will keep an eye out for the MRI tomorrow and then can likely get you into see gastroenterology to determine next step in testing.  Return to the clinic or go to the nearest emergency room if any of your symptoms worsen or new symptoms occur.

## 2024-05-28 NOTE — Progress Notes (Signed)
 Subjective:  Patient ID: Bethany Clarke, female    DOB: 24-Oct-1955  Age: 68 y.o. MRN: [id number]  CC:  Chief Complaint  Patient presents with   Medical Management of Chronic Issues    Follow up for hypothyroidism     HPI Bethany Clarke presents for   Hyponatremia See prior notes, thought to be partly due to hypothyroidism which has improved with adjustment of medication as has her hyponatremia with normal sodium on recent labs. Cut back on free water and some electrolyte drinks - 1 per day.  Lab Results  Component Value Date   NA 135 05/27/2024   K 3.9 05/27/2024   CL 99 05/27/2024   CO2 27 05/27/2024   Hypothyroidism: Lab Results  Component Value Date   TSH 10.14 (H) 05/27/2024  As above, uncontrolled few months ago with adjustment of dose of medication.  Most recently has been on 100 mcg daily since June 26th. Prior 88mcg every day. Would like to wait for repeat tsh in about a month.   History of nausea/vomiting: See prior notes.  CT abdomen pelvis on August 1.  Biliary ductal dilatation with common bile duct measuring 17 mm, tapering of distal common bile duct in the pancreatic head with possible stricture.  Initial LFTs were normal in June, repeat LFTs recently normal as well.  Plan for MRI, MRCP tomorrow for further evaluation and then likely treatment by gastroenterology, possible EUS.   Good days and bad days - more good days than bad. Some nausea, fatigue, episodic dizziness. Only few episodes of abdominal pain. Brief, resolves in about with bending forward. None currently.  No vomiting, no fever.  Bowel movements 1-2 times per week. Last BM 2 days ago. Hard stool.   Lab Results  Component Value Date   ALT 31 05/26/2024   AST 26 05/26/2024   ALKPHOS 65 05/26/2024   BILITOT 0.3 05/26/2024   R hand numbness: New concern. Thumb, middle and pointer finger. Past week. Intermittent. No recent trauma to elbow/wrist.  No change in activity. R hand  dominant. Uses mouse with computer work. Small bump on upper outside of R arm 2 weeks noticed a bruise later. Mosquito bite in that area for a month. Sore on outside of shoulder.  Bump has decreased in size.  No wrist brace, tight clothing or bracelets on R arm.    History Patient Active Problem List   Diagnosis Date Noted   Neck pain on right side 10/04/2022   Cervico-occipital neuralgia of right side 10/04/2022   Pain in joints of left hand 07/17/2019   Chest pain, atypical 03/17/2016   Paresthesia 07/14/2013   Bilateral foot pain 05/01/2013   Subclavian artery stenosis, left (HCC)    Subclavian steal syndrome 03/16/2013   Subclavian arterial stenosis (HCC) 03/11/2013   Carotid stenosis 03/11/2013   Hyperlipidemia with target LDL less than 70 03/11/2013   GERD (gastroesophageal reflux disease)    Hypothyroid    Dizziness    Syncope, near    Heart burn    Hypothyroidism 05/28/2007   COPD GOLD II with reversibility  05/28/2007   Past Medical History:  Diagnosis Date   Atherosclerosis 11/14/2005   carotid US  - mild calcific non-stenotic plaque bilaterally   Carotid bruit 02/05/2009   Doppler - R/L ICAs 0-49% diameter reductin (velocities suggest low-mid range); L subclavian 0-49%  diameter reduction   Chronic bronchitis (HCC)    growing up; not anymore (03/14/2013)   Colon polyp  hyperplastic   Costochondritis    Dizziness    Emphysema of lung (HCC)    GERD (gastroesophageal reflux disease)    H. pylori infection    Heart burn    Hemorrhoids    Hypothyroid    Lipidemia    Pneumonia ~ 2009   Subclavian artery stenosis, left (HCC)    subclavian steal physiology status post PTA and stenting   Syncope, near    Past Surgical History:  Procedure Laterality Date   ABDOMINAL HYSTERECTOMY  1999   BILATERAL UPPER EXTREMITY ANGIOGRAM N/A 03/14/2013   Procedure: BILATERAL UPPER EXTREMITY ANGIOGRAM;  Surgeon: Dorn JINNY Lesches, MD;  Location: [facility] CATH LAB;  Service:  Cardiovascular;  Laterality: N/A;   BREAST BIOPSY Right 1990's   benign (03/14/2013)   CARDIAC CATHETERIZATION  03/03/2013   DILATION AND CURETTAGE OF UTERUS  1980's   KNEE ARTHROSCOPY WITH MENISCAL REPAIR Left 07/21/2021   Procedure: Arthroscopic debridement, arthroscopic debridement of bucket-handle component of lateral meniscus tear, and arthroscopically-assisted repair of lateral meniscus root tear, left knee. ;  Surgeon: Edie Norleen JINNY, MD;  Location: [facility] ORS;  Service: Orthopedics;  Laterality: Left;   LEFT HEART CATHETERIZATION WITH CORONARY ANGIOGRAM Bilateral 03/03/2013   Procedure: LEFT HEART CATHETERIZATION WITH CORONARY ANGIOGRAM;  Surgeon: Debby DELENA Sor, MD;  Location: [facility] CATH LAB;  Service: Cardiovascular;  Laterality: Bilateral;   PERCUTANEOUS STENT INTERVENTION  03/14/2013   Procedure: PERCUTANEOUS STENT INTERVENTION;  Surgeon: Dorn JINNY Lesches, MD;  Location: [facility] CATH LAB;  Service: Cardiovascular;;   SUBCLAVIAN ARTERY STENT Left 03/14/2013    for subclavian steal/claudication (03/14/2013)   TUBAL LIGATION  1980's   No Known Allergies Prior to Admission medications   Medication Sig Start Date End Date Taking? Authorizing Provider  albuterol  (VENTOLIN  HFA) 108 (90 Base) MCG/ACT inhaler INHALE 1-2 PUFFS INTO THE LUNGS EVERY 4 (FOUR) HOURS AS NEEDED FOR WHEEZING OR SHORTNESS OF BREATH. 05/09/23  Yes Levora Reyes SAUNDERS, MD  aspirin  EC 81 MG tablet Take 81 mg by mouth daily. Swallow whole.   Yes [provider]  atorvastatin  (LIPITOR) 40 MG tablet TAKE 1 TABLET BY MOUTH EVERY DAY 03/03/24  Yes Levora Reyes SAUNDERS, MD  Budeson-Glycopyrrol-Formoterol  (BREZTRI  AEROSPHERE) 160-9-4.8 MCG/ACT AERO Inhale 2 puffs into the lungs 2 (two) times daily. 05/18/23  Yes Levora Reyes SAUNDERS, MD  clopidogrel  (PLAVIX ) 75 MG tablet TAKE 1 TABLET BY MOUTH EVERY DAY 04/07/24  Yes Levora Reyes SAUNDERS, MD  FLUoxetine  (PROZAC ) 40 MG capsule Take 1 capsule (40 mg total) by mouth daily. 05/18/23  Yes Levora Reyes SAUNDERS, MD  levothyroxine  (SYNTHROID ) 100 MCG tablet Take 1 tablet (100 mcg total) by mouth daily. 04/10/24  Yes Levora Reyes SAUNDERS, MD   Social History   Socioeconomic History   Marital status: Married    Spouse name: Deward   Number of children: 3   Years of education: Not on file   Highest education level: Some college, no degree  Occupational History   Occupation: Orthoptist: [employer]  Tobacco Use   Smoking status: Former    Current packs/day: 0.00    Average packs/day: 0.5 packs/day for 40.0 years (20.0 ttl pk-yrs)    Types: Cigarettes    Start date: 02/11/1973    Quit date: 02/11/2013    Years since quitting: 11.2   Smokeless tobacco: Never  Substance and Sexual Activity   Alcohol use: No   Drug use: No   Sexual activity: Yes    Comment: number  of sex partners in the last 12 months  1  Other Topics Concern   Not on file  Social History Narrative   Daily caffeine. Married. Education: [employer]. Exercise: No.      Patient is right-handed. She lives with her husband in a two level home. She drinks 2 cups of coffee a day. 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   Social Drivers of Corporate investment banker Strain: Low Risk  (08/14/2023)   Overall Financial Resource Strain (CARDIA)    Difficulty of Paying Living Expenses: Not hard at all  Food Insecurity: No Food Insecurity (08/14/2023)   Hunger Vital Sign    Worried About Running Out of Food in the Last Year: Never true    Ran Out of Food in the Last Year: Never true  Transportation Needs: No Transportation Needs (08/14/2023)   PRAPARE - Administrator, Civil Service (Medical): No    Lack of Transportation (Non-Medical): No  Physical Activity: Sufficiently Active (08/14/2023)   Exercise Vital Sign    Days of Exercise per Week: 5 days    Minutes of Exercise per Session: 60 min  Stress: No Stress Concern Present (08/14/2023)   [employer] of Occupational Health -  Occupational Stress Questionnaire    Feeling of Stress : Not at all  Social Connections: Moderately Integrated (08/14/2023)   Social Connection and Isolation Panel    Frequency of Communication with Friends and Family: Not on file    Frequency of Social Gatherings with Friends and Family: More than three times a week    Attends Religious Services: Never    Database administrator or Organizations: Yes    Attends Engineer, structural: More than 4 times per year    Marital Status: Married  Catering manager Violence: Not At Risk (08/14/2023)   Humiliation, Afraid, Rape, and Kick questionnaire    Fear of Current or Ex-Partner: No    Emotionally Abused: No    Physically Abused: No    Sexually Abused: No    Review of Systems   Objective:   Vitals:   05/28/24 1335  BP: 110/60  Pulse: 62  SpO2: 97%  Weight: 130 lb (59 kg)     Physical Exam Vitals reviewed.  Constitutional:      Appearance: Normal appearance. She is well-developed.  HENT:     Head: Normocephalic and atraumatic.  Eyes:     Conjunctiva/sclera: Conjunctivae normal.     Pupils: Pupils are equal, round, and reactive to light.  Neck:     Vascular: No carotid bruit.  Cardiovascular:     Rate and Rhythm: Normal rate and regular rhythm.     Heart sounds: Normal heart sounds.  Pulmonary:     Effort: Pulmonary effort is normal.     Breath sounds: Normal breath sounds.  Abdominal:     General: There is no distension.     Palpations: Abdomen is soft. There is no pulsatile mass.     Tenderness: There is no abdominal tenderness. There is no guarding.  Musculoskeletal:     Right lower leg: No edema.     Left lower leg: No edema.     Comments: 1.5cm soft lesion just below skin R upper arm, no surrounding erythema. Small abrasion distally without exudate.   Pain-free range of motion of C-spine without reproduction of dysesthesias in the right hand.  Negative Tinel over radial head, negative Tinel at wrist.  Cap  refill less than 1 second of fingertips, sensation intact on current exam.  Pain-free range of motion of wrist.  Skin:    General: Skin is warm and dry.  Neurological:     Mental Status: She is alert and oriented to person, place, and time.  Psychiatric:        Mood and Affect: Mood normal.        Behavior: Behavior normal.        Assessment & Plan:  Bethany Clarke is a 68 y.o. female . Dilation of common bile duct Nausea  - Reassuring LFTs, MRI pending to determine next step in treatment, likely gastroenterology eval plus or minus EUS.  Hyponatremia  - Improved with normal reading on most recent testing after treatment of hypothyroidism as well as decreasing amount of free water.  Hypothyroidism, unspecified type  - Borderline level with TSH as above.  Option of slight higher dose of meds but we decided to continue same regimen for now with repeat TSH in 1 month.  If still elevated at that time can slightly increased regimen.  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  Otherwise reassuring exam.  Activity modification discussed for now, option of brace, avoid direct pressure over radial nerve, but no apparent triggers with routine activity.  If not improving would recommend follow-up with hand specialist.  Localized swelling, mass and lump, right upper limb  - Question contusion versus lipoma.  With prior bruising suspected traumatic contusion but will continue to monitor area, with option to image if not improving.  No orders of the defined types were placed in this encounter.  Patient Instructions  Colace over the counter is an option for stool softener if needed.  Recent sodium level looked good.  Repeat thyroid  test in 1 month and if still elevated, will increase dose of meds to 112mcg.  Try to modify your activity including with use of mouse or any repetitive activity of the right wrist for now to see if that helps  with the tingling symptoms which could be your radial nerve.  If that is not getting better in the next few weeks, let me know.  Bump on the upper arm could be a painful lipoma or possible soft tissue irritation from previous injury.  Lets watch that for now and as long as it continues to improve no new treatment or imaging.  Keep me posted.  I will keep an eye out for the MRI tomorrow and then can likely get you into see gastroenterology to determine next step in testing.  Return to the clinic or go to the nearest emergency room if any of your symptoms worsen or new symptoms occur.     Signed,   Reyes Pines, MD [facility], [facility] 05/28/24 1:54 PM

## 2024-05-29 ENCOUNTER — Ambulatory Visit
Admission: RE | Admit: 2024-05-29 | Discharge: 2024-05-29 | Disposition: A | Discharge status: Home / Self Care | Source: Ambulatory Visit | Attending: Family Medicine | Admitting: Family Medicine

## 2024-05-29 ENCOUNTER — Encounter: Payer: Self-pay | Admitting: Family Medicine

## 2024-05-29 DIAGNOSIS — K838 Other specified diseases of biliary tract: Secondary | ICD-10-CM

## 2024-05-29 DIAGNOSIS — R748 Abnormal levels of other serum enzymes: Secondary | ICD-10-CM

## 2024-05-29 DIAGNOSIS — R110 Nausea: Secondary | ICD-10-CM

## 2024-05-29 DIAGNOSIS — R1110 Vomiting, unspecified: Secondary | ICD-10-CM

## 2024-05-29 MED ORDER — GADOPICLENOL 0.5 MMOL/ML IV SOLN
6.0000 mL | Freq: Once | INTRAVENOUS | Status: AC | PRN
  Administered 2024-05-29: 6 mL via INTRAVENOUS

## 2024-05-30 ENCOUNTER — Other Ambulatory Visit: Payer: Self-pay | Admitting: Family Medicine

## 2024-05-30 DIAGNOSIS — R110 Nausea: Secondary | ICD-10-CM

## 2024-05-30 DIAGNOSIS — K838 Other specified diseases of biliary tract: Secondary | ICD-10-CM

## 2024-05-30 NOTE — Progress Notes (Signed)
 See MRI report, previous plan based on CT.  Referred to GI.

## 2024-07-02 ENCOUNTER — Ambulatory Visit: Admitting: Family Medicine

## 2024-07-02 ENCOUNTER — Encounter: Payer: Self-pay | Admitting: Family Medicine

## 2024-07-02 VITALS — BP 100/58 | HR 70 | Temp 97.6°F | Resp 19

## 2024-07-02 DIAGNOSIS — E871 Hypo-osmolality and hyponatremia: Secondary | ICD-10-CM | POA: Diagnosis not present

## 2024-07-02 DIAGNOSIS — E039 Hypothyroidism, unspecified: Secondary | ICD-10-CM

## 2024-07-02 DIAGNOSIS — K838 Other specified diseases of biliary tract: Secondary | ICD-10-CM | POA: Diagnosis not present

## 2024-07-02 DIAGNOSIS — R110 Nausea: Secondary | ICD-10-CM | POA: Diagnosis not present

## 2024-07-02 DIAGNOSIS — R109 Unspecified abdominal pain: Secondary | ICD-10-CM

## 2024-07-02 NOTE — Patient Instructions (Addendum)
 Colace as option for stool softener. Continue fiber in diet with vegetables.  As we discussed constipation or hard stools can sometimes cause some left-sided abdominal discomfort.  If that persist in spite of normal bowel movements, let me know or be seen.  For the previous common bile duct dilatation, keep follow-up with gastroenterology as planned but be seen if any increasing nausea, vomiting or any worsening symptoms.    I will recheck your thyroid  and sodium levels today and we can adjust plan accordingly.  Glad to hear that overall you are feeling better.  If you feel continued symptoms of balance issues, it may be best to meet with neurology to decide if other testing needed.  Please be seen if any acute worsening symptoms.  Hang in there.  Let Bethany Clarke know I will be thinking about him next week.

## 2024-07-02 NOTE — Progress Notes (Signed)
 Subjective:  Patient ID: Bethany Clarke, female    DOB: 01-13-1956  Age: 68 y.o. MRN: [id number]  CC:  Chief Complaint  Patient presents with   Follow-up    1 month follow up. Feels a lot better. Still nausea and some balance issues.     HPI Bethany Clarke presents for   Hyponatremia, hypothyroidism: See prior notes.  Hyponatremia improved with adjustment of her medication for hypothyroidism as well as restriction on some free water.  TSH was improving with adjustment of medication doses.  Most recently has been on 100 mcg since June 26 of from 88 mcg previously.  Last visit August 13.  Borderline elevation on labs August 12, decided to remain on same dose of meds. Lab Results  Component Value Date   TSH 10.14 (H) 05/27/2024   Lab Results  Component Value Date   NA 135 05/27/2024   K 3.9 05/27/2024   CL 99 05/27/2024   CO2 27 05/27/2024    Nausea Dilation of common bile duct noted previously.  Discussed at her August 13 visit.  CT abdomen pelvis on August 1 with biliary ductal dilatation, common bile duct 17 mm, tapering of distal common bile duct in the pancreatic head with possible stricture.  Initial LFTs were normal in June, as well as repeat LFTs August 11.  Plan for MRI, MRCP for further evaluation and then likely treatment by gastroenterology. MRI abdomen performed on 05/29/2024. .  Mild central intrahepatic biliary dilatation and moderate common bile duct dilatation as demonstrated on recent CT scan.  No obvious cause identified.  No common bile duct stones ampullary or pancreatic lesion, normal gallbladder, no pancreatic mass inflammation or ductal dilatation.  Referred to gastroenterology.  Appointment with Dr. Wilhelmenia 07/30/2024. She has experienced episodic nausea, episodic dizziness previously.  Brief episodes of abdominal pain previously.  Still experiences some nausea on occasion, episodic balance issues -this has improved overall since prior visits.  Some  stress with spouse's health. Comes and goes. No new HA.  Episodic nausea, abd pain after eating at times. Variable locations.  BM's every 3-4 days, sometimes hard stools. No stool softeners. Eating more vegetables. No vomiting. No fevers. Improved eating with something in the morning has helped.  No dysuria/hematuria.   History Patient Active Problem List   Diagnosis Date Noted   Neck pain on right side 10/04/2022   Cervico-occipital neuralgia of right side 10/04/2022   Pain in joints of left hand 07/17/2019   Chest pain, atypical 03/17/2016   Paresthesia 07/14/2013   Bilateral foot pain 05/01/2013   Subclavian artery stenosis, left (HCC)    Subclavian steal syndrome 03/16/2013   Subclavian arterial stenosis (HCC) 03/11/2013   Carotid stenosis 03/11/2013   Hyperlipidemia with target LDL less than 70 03/11/2013   GERD (gastroesophageal reflux disease)    Hypothyroid    Dizziness    Syncope, near    Heart burn    Hypothyroidism 05/28/2007   COPD GOLD II with reversibility  05/28/2007   Past Medical History:  Diagnosis Date   Atherosclerosis 11/14/2005   carotid US  - mild calcific non-stenotic plaque bilaterally   Carotid bruit 02/05/2009   Doppler - R/L ICAs 0-49% diameter reductin (velocities suggest low-mid range); L subclavian 0-49%  diameter reduction   Chronic bronchitis (HCC)    growing up; not anymore (03/14/2013)   Colon polyp    hyperplastic   Costochondritis    Dizziness    Emphysema of lung (HCC)    GERD (  gastroesophageal reflux disease)    H. pylori infection    Heart burn    Hemorrhoids    Hypothyroid    Lipidemia    Pneumonia ~ 2009   Subclavian artery stenosis, left (HCC)    subclavian steal physiology status post PTA and stenting   Syncope, near    Past Surgical History:  Procedure Laterality Date   ABDOMINAL HYSTERECTOMY  1999   BILATERAL UPPER EXTREMITY ANGIOGRAM N/A 03/14/2013   Procedure: BILATERAL UPPER EXTREMITY ANGIOGRAM;  Surgeon: Dorn JINNY Lesches, MD;  Location: [facility] CATH LAB;  Service: Cardiovascular;  Laterality: N/A;   BREAST BIOPSY Right 1990's   benign (03/14/2013)   CARDIAC CATHETERIZATION  03/03/2013   DILATION AND CURETTAGE OF UTERUS  1980's   KNEE ARTHROSCOPY WITH MENISCAL REPAIR Left 07/21/2021   Procedure: Arthroscopic debridement, arthroscopic debridement of bucket-handle component of lateral meniscus tear, and arthroscopically-assisted repair of lateral meniscus root tear, left knee. ;  Surgeon: Edie Norleen JINNY, MD;  Location: [facility] ORS;  Service: Orthopedics;  Laterality: Left;   LEFT HEART CATHETERIZATION WITH CORONARY ANGIOGRAM Bilateral 03/03/2013   Procedure: LEFT HEART CATHETERIZATION WITH CORONARY ANGIOGRAM;  Surgeon: Debby DELENA Sor, MD;  Location: [facility] CATH LAB;  Service: Cardiovascular;  Laterality: Bilateral;   PERCUTANEOUS STENT INTERVENTION  03/14/2013   Procedure: PERCUTANEOUS STENT INTERVENTION;  Surgeon: Dorn JINNY Lesches, MD;  Location: [facility] CATH LAB;  Service: Cardiovascular;;   SUBCLAVIAN ARTERY STENT Left 03/14/2013    for subclavian steal/claudication (03/14/2013)   TUBAL LIGATION  1980's   No Known Allergies Prior to Admission medications   Medication Sig Start Date End Date Taking? Authorizing Provider  aspirin  EC 81 MG tablet Take 81 mg by mouth daily. Swallow whole.   Yes [provider]  atorvastatin  (LIPITOR) 40 MG tablet TAKE 1 TABLET BY MOUTH EVERY DAY 03/03/24  Yes Levora Reyes SAUNDERS, MD  clopidogrel  (PLAVIX ) 75 MG tablet TAKE 1 TABLET BY MOUTH EVERY DAY 04/07/24  Yes Levora Reyes SAUNDERS, MD  FLUoxetine  (PROZAC ) 40 MG capsule Take 1 capsule (40 mg total) by mouth daily. 05/18/23  Yes Levora Reyes SAUNDERS, MD  levothyroxine  (SYNTHROID ) 100 MCG tablet Take 1 tablet (100 mcg total) by mouth daily. 04/10/24  Yes Levora Reyes SAUNDERS, MD  albuterol  (VENTOLIN  HFA) 108 (90 Base) MCG/ACT inhaler INHALE 1-2 PUFFS INTO THE LUNGS EVERY 4 (FOUR) HOURS AS NEEDED FOR WHEEZING OR SHORTNESS OF BREATH. Patient not taking:  Reported on 07/02/2024 05/09/23   Levora Reyes SAUNDERS, MD  Budeson-Glycopyrrol-Formoterol  (BREZTRI  AEROSPHERE) 160-9-4.8 MCG/ACT AERO Inhale 2 puffs into the lungs 2 (two) times daily. Patient not taking: Reported on 07/02/2024 05/18/23   Levora Reyes SAUNDERS, MD   Social History   Socioeconomic History   Marital status: Married    Spouse name: Deward   Number of children: 3   Years of education: Not on file   Highest education level: Some college, no degree  Occupational History   Occupation: Orthoptist: [employer]  Tobacco Use   Smoking status: Former    Current packs/day: 0.00    Average packs/day: 0.5 packs/day for 40.0 years (20.0 ttl pk-yrs)    Types: Cigarettes    Start date: 02/11/1973    Quit date: 02/11/2013    Years since quitting: 11.3   Smokeless tobacco: Never  Vaping Use   Vaping status: Never Used  Substance and Sexual Activity   Alcohol use: No   Drug use: No   Sexual activity: Yes  Comment: number of sex partners in the last 12 months  1  Other Topics Concern   Not on file  Social History Narrative   Daily caffeine. Married. Education: [employer]. Exercise: No.      Patient is right-handed. She lives with her husband in a two level home. She drinks 2 cups of coffee a day. 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   Social Drivers of Corporate investment banker Strain: Low Risk  (08/14/2023)   Overall Financial Resource Strain (CARDIA)    Difficulty of Paying Living Expenses: Not hard at all  Food Insecurity: No Food Insecurity (08/14/2023)   Hunger Vital Sign    Worried About Running Out of Food in the Last Year: Never true    Ran Out of Food in the Last Year: Never true  Transportation Needs: No Transportation Needs (08/14/2023)   PRAPARE - Administrator, Civil Service (Medical): No    Lack of Transportation (Non-Medical): No  Physical Activity: Sufficiently Active (08/14/2023)   Exercise Vital Sign    Days of  Exercise per Week: 5 days    Minutes of Exercise per Session: 60 min  Stress: No Stress Concern Present (08/14/2023)   [employer] of Occupational Health - Occupational Stress Questionnaire    Feeling of Stress : Not at all  Social Connections: Moderately Integrated (08/14/2023)   Social Connection and Isolation Panel    Frequency of Communication with Friends and Family: Not on file    Frequency of Social Gatherings with Friends and Family: More than three times a week    Attends Religious Services: Never    Database administrator or Organizations: Yes    Attends Engineer, structural: More than 4 times per year    Marital Status: Married  Catering manager Violence: Not At Risk (08/14/2023)   Humiliation, Afraid, Rape, and Kick questionnaire    Fear of Current or Ex-Partner: No    Emotionally Abused: No    Physically Abused: No    Sexually Abused: No    Review of Systems Per HPI  Objective:   Vitals:   07/02/24 1447  BP: (!) 100/58  Pulse: 70  Resp: 19  Temp: 97.6 F (36.4 C)  TempSrc: Temporal  SpO2: 95%     Physical Exam Vitals reviewed.  Constitutional:      General: She is not in acute distress.    Appearance: Normal appearance. She is well-developed. She is not ill-appearing, toxic-appearing or diaphoretic.  HENT:     Head: Normocephalic and atraumatic.  Eyes:     Conjunctiva/sclera: Conjunctivae normal.     Pupils: Pupils are equal, round, and reactive to light.  Neck:     Vascular: No carotid bruit.  Cardiovascular:     Rate and Rhythm: Normal rate and regular rhythm.     Heart sounds: Normal heart sounds.  Pulmonary:     Effort: Pulmonary effort is normal.     Breath sounds: Normal breath sounds.  Abdominal:     General: There is no distension.     Palpations: Abdomen is soft. There is no pulsatile mass.     Tenderness: There is no abdominal tenderness.  Musculoskeletal:     Right lower leg: No edema.     Left lower leg: No edema.   Skin:    General: Skin is warm and dry.  Neurological:     Mental Status: She is alert and oriented to person, place,  and time.  Psychiatric:        Mood and Affect: Mood normal.        Behavior: Behavior normal.        Assessment & Plan:  Bethany Clarke is a 68 y.o. female . Hypothyroidism, unspecified type - Plan: TSH  - Repeat TSH is similar level, will increase dose of meds slightly with repeat level in 6 weeks, see lab results.  Nausea Hyponatremia - Plan: Comprehensive metabolic panel with GFR  - Nausea has overall improved.  Hyponatremia with repeat labs, stable, mild.  Continue to monitor.  Dilation of common bile duct - Plan: Comprehensive metabolic panel with GFR Left sided abdominal pain   - MRI as above, has GI follow-up planned.  Overall improved symptoms.  LFTs have been stable.  RTC/ER precautions if new/worsening symptoms.  Some of her intermittent discomfort related to constipation as above, stool softener discussed, fiber and fluids in the diet  We discussed potential referral to neurology if persistent balance issues, improved at this time, will hold on referral for now with RTC precautions given.  No orders of the defined types were placed in this encounter.  Patient Instructions  Colace as option for stool softener. Continue fiber in diet with vegetables.  As we discussed constipation or hard stools can sometimes cause some left-sided abdominal discomfort.  If that persist in spite of normal bowel movements, let me know or be seen.  For the previous common bile duct dilatation, keep follow-up with gastroenterology as planned but be seen if any increasing nausea, vomiting or any worsening symptoms.    I will recheck your thyroid  and sodium levels today and we can adjust plan accordingly.  Glad to hear that overall you are feeling better.  If you feel continued symptoms of balance issues, it may be best to meet with neurology to decide if other testing  needed.  Please be seen if any acute worsening symptoms.  Hang in there.  Let Deward know I will be thinking about him next week.      Signed,   Reyes Pines, MD [facility], [facility] 07/02/24 3:36 PM

## 2024-07-03 LAB — COMPREHENSIVE METABOLIC PANEL WITH GFR
ALT: 34 U/L (ref 0–35)
AST: 24 U/L (ref 0–37)
Albumin: 4.6 g/dL (ref 3.5–5.2)
Alkaline Phosphatase: 59 U/L (ref 39–117)
BUN: 17 mg/dL (ref 6–23)
CO2: 28 meq/L (ref 19–32)
Calcium: 9.9 mg/dL (ref 8.4–10.5)
Chloride: 98 meq/L (ref 96–112)
Creatinine, Ser: 0.75 mg/dL (ref 0.40–1.20)
GFR: 82.07 mL/min (ref >60.00)
Glucose, Bld: 92 mg/dL (ref 70–99)
Potassium: 3.9 meq/L (ref 3.5–5.1)
Sodium: 132 meq/L — ABNORMAL LOW (ref 135–145)
Total Bilirubin: 0.2 mg/dL (ref 0.2–1.2)
Total Protein: 7.2 g/dL (ref 6.0–8.3)

## 2024-07-03 LAB — TSH: TSH: 10.43 u[IU]/mL — ABNORMAL HIGH (ref 0.35–5.50)

## 2024-07-04 ENCOUNTER — Ambulatory Visit: Payer: Self-pay | Admitting: Family Medicine

## 2024-07-04 DIAGNOSIS — E039 Hypothyroidism, unspecified: Secondary | ICD-10-CM

## 2024-07-08 ENCOUNTER — Other Ambulatory Visit: Payer: Self-pay | Admitting: Family Medicine

## 2024-07-08 DIAGNOSIS — F418 Other specified anxiety disorders: Secondary | ICD-10-CM

## 2024-07-24 ENCOUNTER — Encounter: Payer: Self-pay | Admitting: Emergency Medicine

## 2024-07-24 ENCOUNTER — Other Ambulatory Visit: Payer: Self-pay

## 2024-07-24 ENCOUNTER — Emergency Department: Admission: EM | Admit: 2024-07-24 | Discharge: 2024-07-24 | Disposition: A | Discharge status: Home / Self Care

## 2024-07-24 DIAGNOSIS — E039 Hypothyroidism, unspecified: Secondary | ICD-10-CM | POA: Insufficient documentation

## 2024-07-24 DIAGNOSIS — R9431 Abnormal electrocardiogram [ECG] [EKG]: Secondary | ICD-10-CM | POA: Diagnosis not present

## 2024-07-24 DIAGNOSIS — E860 Dehydration: Secondary | ICD-10-CM | POA: Diagnosis present

## 2024-07-24 DIAGNOSIS — E871 Hypo-osmolality and hyponatremia: Secondary | ICD-10-CM | POA: Diagnosis not present

## 2024-07-24 LAB — COMPREHENSIVE METABOLIC PANEL WITH GFR
ALT: 48 U/L — ABNORMAL HIGH (ref 0–44)
AST: 31 U/L (ref 15–41)
Albumin: 4.3 g/dL (ref 3.5–5.0)
Alkaline Phosphatase: 73 U/L (ref 38–126)
Anion gap: 8 (ref 5–15)
BUN: 7 mg/dL — ABNORMAL LOW (ref 8–23)
CO2: 29 mmol/L (ref 22–32)
Calcium: 9.5 mg/dL (ref 8.9–10.3)
Chloride: 95 mmol/L — ABNORMAL LOW (ref 98–111)
Creatinine, Ser: 0.64 mg/dL (ref 0.44–1.00)
GFR, Estimated: >60 mL/min (ref >60)
Glucose, Bld: 109 mg/dL — ABNORMAL HIGH (ref 70–99)
Potassium: 4.7 mmol/L (ref 3.5–5.1)
Sodium: 132 mmol/L — ABNORMAL LOW (ref 135–145)
Total Bilirubin: 0.2 mg/dL (ref 0.0–1.2)
Total Protein: 7.3 g/dL (ref 6.5–8.1)

## 2024-07-24 LAB — CBC
HCT: 41.3 % (ref 36.0–46.0)
Hemoglobin: 14.1 g/dL (ref 12.0–15.0)
MCH: 33.6 pg (ref 26.0–34.0)
MCHC: 34.1 g/dL (ref 30.0–36.0)
MCV: 98.3 fL (ref 80.0–100.0)
Platelets: 238 K/uL (ref 150–400)
RBC: 4.2 MIL/uL (ref 3.87–5.11)
RDW: 11.2 % — ABNORMAL LOW (ref 11.5–15.5)
WBC: 4.8 K/uL (ref 4.0–10.5)
nRBC: 0 % (ref 0.0–0.2)

## 2024-07-24 LAB — PROTIME-INR
INR: 1 (ref 0.8–1.2)
Prothrombin Time: 13.4 s (ref 11.4–15.2)

## 2024-07-24 MED ORDER — SODIUM CHLORIDE 0.9 % IV BOLUS
1000.0000 mL | Freq: Once | INTRAVENOUS | Status: AC
  Administered 2024-07-24: 1000 mL via INTRAVENOUS

## 2024-07-24 MED ORDER — ONDANSETRON HCL 4 MG/2ML IJ SOLN
4.0000 mg | Freq: Once | INTRAMUSCULAR | Status: AC
  Administered 2024-07-24: 4 mg via INTRAVENOUS
  Filled 2024-07-24: qty 2

## 2024-07-24 NOTE — ED Triage Notes (Signed)
 Pt reports hx of low sodium and dehydration. Pt endorses nausea and weakness. Denies abd pain at this time.

## 2024-07-24 NOTE — ED Notes (Signed)
 Bed alarm on, fall risk bracelet on, and pt wearing socks. Call bell near pt

## 2024-07-24 NOTE — Discharge Instructions (Addendum)
 Your lab work showed mildly low sodium in which we repleted with IV fluids.  Please follow-up with your primary care provider as discussed for further management.  If any new or worsening symptoms occur please return to ED for further evaluation.

## 2024-07-24 NOTE — ED Provider Notes (Signed)
 [facility] Emergency Department Provider Note     Event Date/Time   First MD Initiated Contact with Patient 07/24/24 1503     (approximate)   History   Weakness and Nausea   HPI  Bethany Clarke is Clarke 68 y.o. female with Clarke past medical history of atherosclerosis and hypothyroidism presents to the ED with complaint of fatigue and dehydration with onset of yesterday afternoon.  Patient states she has Clarke history of low sodium.  Associated symptoms includes nausea described as dry heaving. She denies chest pain, shortness of breath and abdominal pain. No fever, urinary symptoms, changes bowel movement, confusion or AMS.     Physical Exam   Triage Vital Signs: ED Triage Vitals  Encounter Vitals Group     BP 07/24/24 1152 (!) 161/86     Girls Systolic BP Percentile --      Girls Diastolic BP Percentile --      Boys Systolic BP Percentile --      Boys Diastolic BP Percentile --      Pulse Rate 07/24/24 1152 80     Resp 07/24/24 1152 18     Temp 07/24/24 1152 98.2 F (36.8 C)     Temp Source 07/24/24 1152 Oral     SpO2 07/24/24 1152 93 %     Weight 07/24/24 1151 123 lb (55.8 kg)     Height 07/24/24 1151 5' 4 (1.626 m)     Head Circumference --      Peak Flow --      Pain Score 07/24/24 1151 0     Pain Loc --      Pain Education --      Exclude from Growth Chart --     Most recent vital signs: Vitals:   07/24/24 1152 07/24/24 1520  BP: (!) 161/86 (!) 155/84  Pulse: 80 80  Resp: 18 18  Temp: 98.2 F (36.8 C) 97.6 F (36.4 C)  SpO2: 93% 97%   General: Alert and oriented. INAD.  Speaking complete sentences. Head:  NCAT.  Eyes:  PERRLA. EOMI. tears are being produced. CV:  Good peripheral perfusion. RRR. No peripheral edema.  RESP:  Normal effort. LCTAB.  ABD:  No distention. Soft, Non tender.  NEURO: Cranial nerves intact. No focal deficits.   ED Results / Procedures / Treatments   Labs (all labs ordered are listed, but only  abnormal results are displayed) Labs Reviewed  COMPREHENSIVE METABOLIC PANEL WITH GFR - Abnormal; Notable for the following components:      Result Value   Sodium 132 (*)    Chloride 95 (*)    Glucose, Bld 109 (*)    BUN 7 (*)    ALT 48 (*)    All other components within normal limits  CBC - Abnormal; Notable for the following components:   RDW 11.2 (*)    All other components within normal limits  PROTIME-INR   EKG  Normal sinus rhythm  No results found.  PROCEDURES:  Critical Care performed: No  Procedures  MEDICATIONS ORDERED IN ED: Medications  sodium chloride  0.9 % bolus 1,000 mL (0 mLs Intravenous Stopped 07/24/24 1736)  ondansetron  (ZOFRAN ) injection 4 mg (4 mg Intravenous Given 07/24/24 1518)    IMPRESSION / MDM / ASSESSMENT AND PLAN / ED COURSE  I reviewed the triage vital signs and the nursing notes.  Clinical Course as of 07/24/24 2207  Thu Jul 24, 2024  1600 ED EKG Normal sinus rhythm [MH]  1600 Comprehensive metabolic panel(!) Mild Hyponatremia [MH]  1601 CBC(!) wnl [MH]  1644 On reassessment patient reports significantly improvement in symptoms.  I do believe she is in stable condition for discharge home and outpatient management.  Patient has Clarke appointment next Thursday [MH]    Clinical Course User Index [MH] Bethany Clarke LABOR, PA-C    68 y.o. female presents to the emergency department for evaluation and treatment of concerns of dehydration with history of hyponatremia. See HPI for further details.   Differential diagnosis includes, but is not limited to electrolyte abnormality, arrhythmia, anemia, dehydration  Patient's presentation is most consistent with acute complicated illness / injury requiring diagnostic workup.  On initial assessment patient is nontoxic-appearing.  She is speaking complete sentences and coherent.  She states that her mouth is dry and has had similar symptoms in the past resolved with fluids.  Basic labs obtained from triage reveals sodium of 132 and chloride of 95 otherwise normal.  CBC within normal limits.  Physical exam findings are reassuring.  No neurodeficits and normal abdominal exam.  No indication for further workup with imaging at this time.  Will administer IV fluids and Zofran  and reassess.  On reassessment, significant improvement in symptoms.  Patient feels much better and is ready for discharge.  She is in stable condition for discharge home.  ED return precautions discussed.  FINAL CLINICAL IMPRESSION(S) / ED DIAGNOSES   Final diagnoses:  Dehydration  Hyponatremia   Rx / DC Orders   ED Discharge Orders     None        Note:  This document was prepared using Dragon voice recognition software and may include unintentional dictation errors.    Bethany, Bethany Samad A, PA-C 07/24/24 2208    Bethany Clarke LABOR, MD 07/25/24 [phone]

## 2024-07-25 ENCOUNTER — Ambulatory Visit: Payer: Self-pay | Admitting: Family Medicine

## 2024-07-25 ENCOUNTER — Other Ambulatory Visit: Payer: Self-pay | Admitting: Family Medicine

## 2024-07-25 DIAGNOSIS — R110 Nausea: Secondary | ICD-10-CM

## 2024-07-25 MED ORDER — ONDANSETRON HCL 4 MG PO TABS: 4.0000 mg | ORAL_TABLET | Freq: Three times a day (TID) | ORAL | 0 refills | Status: DC | PRN

## 2024-07-25 NOTE — Telephone Encounter (Signed)
 FYI

## 2024-07-25 NOTE — Telephone Encounter (Signed)
 Note reviewed from yesterday ED visit, not yet completed but I do see that she was initially evaluated for nausea, labs noted.  Zofran  given 1 dose and ER, I will order some to use at home, but should be seen if any further episodes of vomiting/dry heaving or any new/worsening symptoms.  Additionally given her previous, bile duct dilatation and slight elevated LFT on labs yesterday, should have repeat LFTs within a week.  I do see that she has an appointment with gastroenterology on the 17th.  As long as she is improving, can have repeat labs at that time.  Should be seen sooner if any new or worsening symptoms.  Thanks

## 2024-07-25 NOTE — Progress Notes (Signed)
 See other phone note, unable to order Zofran  through that encounter.  Zofran  ordered for nausea per that plan.

## 2024-07-25 NOTE — Telephone Encounter (Signed)
 FYI Only or Action Required?: Action required by provider: medication refill request.  Patient was last seen in primary care on 07/02/2024 by Levora Reyes SAUNDERS, MD.  Called Nurse Triage reporting Nausea.  Symptoms are: gradually improving.  Triage Disposition: Call PCP When Office is Open  Patient/caregiver understands and will follow disposition?: Yes         Copied from CRM #[id number]. Topic: Clinical - Prescription Issue >> Jul 25, 2024  8:26 AM Zane F wrote: Reason for CRM:   Patient is calling in after being seen in the ED yesterday for dehydration. Patient is calling to have a prescription placed for Zofran . Patient was informed by the physician in the ED that they would send in a prescription for the medication but have not done so.   Patient has reported the nausea is coming back but no accompanied symptoms outside of the nausea.  Please call patient back once prescription has been sent in.   [phone] [place] - [address] [place] [address] Phone: [phone] Fax: [phone] Hours: Not open 24 hours  Callback Number: [phone] Reason for Disposition  [1] Prescription refill request for NON-ESSENTIAL medicine (i.e., no harm to patient if med not taken) AND [2] triager unable to refill per department policy  Answer Assessment - Initial Assessment Questions Pt was seen in ED yesterday for dehydration. Pt was told by ED physician that a prescription for Zofran  would be sent to her pharmacy but pt has not received it yet.    Preferred Pharmacy: [id number] [place], [place] - [address] [place] [address] Phone: [phone] Fax: [phone] Hours: Not open 24 hours   NAUSEA SEVERITY: How bad is the nausea? (e.g., mild, moderate, severe; dehydration, weight loss)     Has improved but pt wants medication  Protocols used: Nausea-A-AH, Medication Refill and  Renewal Call-A-AH

## 2024-07-25 NOTE — Telephone Encounter (Signed)
 Patient verbalized understanding. She will let us  know if she has any new sx or worsening sx

## 2024-07-25 NOTE — Telephone Encounter (Signed)
 Okay to send in RX for Zofran ? ED did not send in for patient

## 2024-07-30 ENCOUNTER — Ambulatory Visit: Admitting: Gastroenterology

## 2024-07-30 ENCOUNTER — Ambulatory Visit: Attending: Cardiovascular Disease | Admitting: Cardiovascular Disease

## 2024-07-30 ENCOUNTER — Encounter: Payer: Self-pay | Admitting: Cardiovascular Disease

## 2024-07-30 VITALS — BP 122/70 | HR 62 | Ht 64.0 in | Wt 125.8 lb

## 2024-07-30 DIAGNOSIS — I6523 Occlusion and stenosis of bilateral carotid arteries: Secondary | ICD-10-CM | POA: Diagnosis not present

## 2024-07-30 DIAGNOSIS — I771 Stricture of artery: Secondary | ICD-10-CM | POA: Diagnosis not present

## 2024-07-30 DIAGNOSIS — E785 Hyperlipidemia, unspecified: Secondary | ICD-10-CM | POA: Insufficient documentation

## 2024-07-30 NOTE — Patient Instructions (Addendum)
 Medication Instructions:  Your physician recommends that you continue on your current medications as directed. Please refer to the Current Medication list given to you today.  *If you need a refill on your cardiac medications before your next appointment, please call your pharmacy*  Lab Work: none If you have labs (blood work) drawn today and your tests are completely normal, you will receive your results only by: MyChart Message (if you have MyChart) OR A paper copy in the mail If you have any lab test that is abnormal or we need to change your treatment, we will call you to review the results.  Testing/Procedures: Neck CTA to be done in January 2026--the phone number to call to schedule is [phone]   Follow-Up: At [facility], you and your health needs are our priority.  As part of our continuing mission to provide you with exceptional heart care, our providers are all part of one team.  This team includes your primary Cardiologist (physician) and Advanced Practice Providers or APPs (Physician Assistants and Nurse Practitioners) who all work together to provide you with the care you need, when you need it.  Your next appointment:   12 month(s)  Provider:   Lonni Cash, MD    We recommend signing up for the patient portal called MyChart.  Sign up information is provided on this After Visit Summary.  MyChart is used to connect with patients for Virtual Visits (Telemedicine).  Patients are able to view lab/test results, encounter notes, upcoming appointments, etc.  Non-urgent messages can be sent to your provider as well.   To learn more about what you can do with MyChart, go to [email or web address].   Other Instructions

## 2024-07-30 NOTE — Progress Notes (Signed)
 Chief Complaint  Patient presents with   Follow-up    PAD   History of Present Illness: 69 yo female with history of carotid artery disease, subclavian artery stenosis s/p stenting, COPD, GERD, hypothyroidism and HLD who is here today for follow up. She has been followed in our office by Dr. Burnard. She has had stenting of the left subclavian artery. Moderate LICA stenosis, stable by neck CTA January 2025. Echo in April 2022 with normal LV systolic function. No valve disease. Cardiac cath in May 2014 with minimal calcification of the RCA but no obstructive disease noted. Stress test in December 2018 with no ischemia.   She is here today for follow up. The patient denies any chest pain, dyspnea, palpitations, lower extremity edema, orthopnea, PND, dizziness, near syncope or syncope.   Primary Care Physician: Levora Reyes SAUNDERS, MD   Past Medical History:  Diagnosis Date   Atherosclerosis 11/14/2005   carotid US  - mild calcific non-stenotic plaque bilaterally   Carotid bruit 02/05/2009   Doppler - R/L ICAs 0-49% diameter reductin (velocities suggest low-mid range); L subclavian 0-49%  diameter reduction   Chronic bronchitis (HCC)    growing up; not anymore (03/14/2013)   Colon polyp    hyperplastic   Costochondritis    Dizziness    Emphysema of lung (HCC)    GERD (gastroesophageal reflux disease)    H. pylori infection    Heart burn    Hemorrhoids    Hypothyroid    Lipidemia    Pneumonia ~ 2009   Subclavian artery stenosis, left    subclavian steal physiology status post PTA and stenting   Syncope, near     Past Surgical History:  Procedure Laterality Date   ABDOMINAL HYSTERECTOMY  1999   BILATERAL UPPER EXTREMITY ANGIOGRAM N/A 03/14/2013   Procedure: BILATERAL UPPER EXTREMITY ANGIOGRAM;  Surgeon: Dorn JINNY Lesches, MD;  Location: [facility] CATH LAB;  Service: Cardiovascular;  Laterality: N/A;   BREAST BIOPSY Right 1990's   benign (03/14/2013)   CARDIAC CATHETERIZATION  03/03/2013    DILATION AND CURETTAGE OF UTERUS  1980's   KNEE ARTHROSCOPY WITH MENISCAL REPAIR Left 07/21/2021   Procedure: Arthroscopic debridement, arthroscopic debridement of bucket-handle component of lateral meniscus tear, and arthroscopically-assisted repair of lateral meniscus root tear, left knee. ;  Surgeon: Edie Norleen JINNY, MD;  Location: [facility] ORS;  Service: Orthopedics;  Laterality: Left;   LEFT HEART CATHETERIZATION WITH CORONARY ANGIOGRAM Bilateral 03/03/2013   Procedure: LEFT HEART CATHETERIZATION WITH CORONARY ANGIOGRAM;  Surgeon: Debby DELENA Burnard, MD;  Location: [facility] CATH LAB;  Service: Cardiovascular;  Laterality: Bilateral;   PERCUTANEOUS STENT INTERVENTION  03/14/2013   Procedure: PERCUTANEOUS STENT INTERVENTION;  Surgeon: Dorn JINNY Lesches, MD;  Location: [facility] CATH LAB;  Service: Cardiovascular;;   SUBCLAVIAN ARTERY STENT Left 03/14/2013    for subclavian steal/claudication (03/14/2013)   TUBAL LIGATION  1980's    Current Outpatient Medications  Medication Sig Dispense Refill   aspirin  EC 81 MG tablet Take 81 mg by mouth daily. Swallow whole.     atorvastatin  (LIPITOR) 40 MG tablet TAKE 1 TABLET BY MOUTH EVERY DAY 90 tablet 2   clopidogrel  (PLAVIX ) 75 MG tablet TAKE 1 TABLET BY MOUTH EVERY DAY 90 tablet 2   FLUoxetine  (PROZAC ) 40 MG capsule TAKE 1 CAPSULE (40 MG TOTAL) BY MOUTH DAILY. 90 capsule 3   levothyroxine  (SYNTHROID ) 100 MCG tablet Take 1 tablet (100 mcg total) by mouth daily. 90 tablet 3   ondansetron  (ZOFRAN ) 4 MG tablet  Take 1 tablet (4 mg total) by mouth every 8 (eight) hours as needed for nausea or vomiting. 20 tablet 0   albuterol  (VENTOLIN  HFA) 108 (90 Base) MCG/ACT inhaler INHALE 1-2 PUFFS INTO THE LUNGS EVERY 4 (FOUR) HOURS AS NEEDED FOR WHEEZING OR SHORTNESS OF BREATH. (Patient not taking: Reported on 07/30/2024) 18 g 2   Budeson-Glycopyrrol-Formoterol  (BREZTRI  AEROSPHERE) 160-9-4.8 MCG/ACT AERO Inhale 2 puffs into the lungs 2 (two) times daily. (Patient not taking: Reported on  07/02/2024) 10.7 g 11   No current facility-administered medications for this visit.    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 Married. Education: [employer]. Exercise: No.      Patient is right-handed. She lives with her husband in a two level home. She drinks 2 cups of coffee a day. 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   Social Drivers of Corporate investment banker Strain: Low Risk  (08/14/2023)   Overall Financial Resource Strain (CARDIA)    Difficulty of Paying Living Expenses: Not hard at all  Food Insecurity: No Food Insecurity (08/14/2023)   Hunger Vital Sign    Worried About Running Out of Food in the Last Year: Never true    Ran Out of Food in the Last Year: Never true  Transportation Needs: No Transportation Needs (08/14/2023)   PRAPARE - Administrator, Civil Service (Medical): No    Lack of Transportation (Non-Medical): No  Physical Activity: Sufficiently Active (08/14/2023)   Exercise Vital Sign    Days of Exercise per Week: 5 days    Minutes of Exercise per Session: 60 min  Stress: No Stress Concern Present (08/14/2023)   [employer] of  Occupational Health - Occupational Stress Questionnaire    Feeling of Stress : Not at all  Social Connections: Moderately Integrated (08/14/2023)   Social Connection and Isolation Panel    Frequency of Communication with Friends and Family: Not on file    Frequency of Social Gatherings with Friends and Family: More than three times a week    Attends Religious Services: Never    Database administrator or Organizations: Yes    Attends Engineer, structural: More than 4 times per year    Marital Status: Married  Catering manager Violence: Not At Risk (08/14/2023)   Humiliation, Afraid, Rape, and Kick questionnaire    Fear of Current or Ex-Partner: No    Emotionally Abused: No    Physically Abused: No    Sexually Abused: No  Family History  Problem Relation Age of Onset   Cancer Brother        spine/lung   COPD Brother        smoked   Hypertension Brother    Rheum arthritis Brother    Hyperlipidemia Brother    Hypertension Brother    Diabetes Mother    Heart disease Mother    Stroke Mother    Cancer Father        cirrohosis   Hypertension Father    Heart disease Sister    Diabetes Sister    Hyperlipidemia Sister    Hypertension Sister    COPD Sister    Arthritis Sister        rheumatoid   Diabetes Sister    Arthritis Sister        rheumatoid   Colon cancer Neg Hx     Review of Systems:  As stated in the HPI and otherwise negative.   BP 122/70   Pulse 62   Ht 5' 4 (1.626 m)   Wt 125 lb 12.8 oz (57.1 kg)   SpO2 93%   BMI 21.59 kg/m   Physical Examination: General: Well developed, well nourished, NAD  HEENT: OP clear, mucus membranes moist  SKIN: warm, dry. No rashes. Neuro: No focal deficits  Musculoskeletal: Muscle strength 5/5 all ext  Psychiatric: Mood and affect normal  Neck: No JVD, no carotid bruits, no thyromegaly, no lymphadenopathy.  Lungs:Clear bilaterally, no wheezes, rhonci, crackles Cardiovascular: Regular rate and rhythm. No  murmurs, gallops or rubs. Abdomen:Soft. Bowel sounds present. Non-tender.  Extremities: No lower extremity edema. Pulses are 2 + in the bilateral DP/PT.  EKG:  EKG is not ordered today. The ekg ordered today demonstrates   Recent Labs: 07/02/2024: TSH 10.43 07/24/2024: ALT 48; BUN 7; Creatinine, Ser 0.64; Hemoglobin 14.1; Platelets 238; Potassium 4.7; Sodium 132   Lipid Panel    Component Value Date/Time   CHOL 120 11/13/2023 0759   CHOL 166 12/23/2020 1127   TRIG 67.0 11/13/2023 0759   HDL 49.80 11/13/2023 0759   HDL 69 12/23/2020 1127   CHOLHDL 2 11/13/2023 0759   VLDL 13.4 11/13/2023 0759   LDLCALC 57 11/13/2023 0759   LDLCALC 73 12/23/2020 1127     Wt Readings from Last 3 Encounters:  07/30/24 125 lb 12.8 oz (57.1 kg)  07/24/24 123 lb (55.8 kg)  05/28/24 130 lb (59 kg)    Assessment and Plan:   1. Carotid artery disease, Subclavian artery stenosis: Moderate LICA stenosis, stable by neck CTA January 2025. Patent left subclavian artery stent. Her tests have been followed by Dr. Court but he has not seen her in the [facility] clinic in years. I will arrange a neck CTA in January 2026. She requests that we not repeat carotid artery dopplers. Continue ASA, Plavix  and statin.    2. Hyperlipidemia: LDL 57 in January 2025. Continue statin  Labs/ tests ordered today include:   Orders Placed This Encounter  Procedures   CT ANGIO NECK W OR WO CONTRAST   Disposition:   F/U with me in one year   Signed, Lonni Cash, MD, [facility] 07/30/2024 4:07 PM    [facility] [address] [place], [place], [place]  [address] Phone: [phone]; Fax: [phone]

## 2024-08-01 ENCOUNTER — Telehealth: Payer: Self-pay

## 2024-08-01 ENCOUNTER — Ambulatory Visit: Admitting: Gastroenterology

## 2024-08-01 ENCOUNTER — Encounter: Payer: Self-pay | Admitting: Gastroenterology

## 2024-08-01 VITALS — BP 100/70 | HR 68 | Ht 63.75 in | Wt 124.0 lb

## 2024-08-01 DIAGNOSIS — R1011 Right upper quadrant pain: Secondary | ICD-10-CM | POA: Diagnosis not present

## 2024-08-01 DIAGNOSIS — R7401 Elevation of levels of liver transaminase levels: Secondary | ICD-10-CM

## 2024-08-01 DIAGNOSIS — K838 Other specified diseases of biliary tract: Secondary | ICD-10-CM | POA: Diagnosis not present

## 2024-08-01 DIAGNOSIS — R110 Nausea: Secondary | ICD-10-CM | POA: Diagnosis not present

## 2024-08-01 DIAGNOSIS — K5909 Other constipation: Secondary | ICD-10-CM | POA: Insufficient documentation

## 2024-08-01 DIAGNOSIS — K5900 Constipation, unspecified: Secondary | ICD-10-CM

## 2024-08-01 NOTE — Progress Notes (Signed)
 GASTROENTEROLOGY OUTPATIENT CLINIC VISIT   Primary Care Provider Levora Reyes SAUNDERS, MD [address] [place] [address] [phone]  Referring Provider Dr. Abran  Patient Profile: Bethany Clarke is a 68 y.o. female with a pmh significant for hypothyroidism, emphysema, carotid artery disease, subclavian artery stenosis (on Plavix ), hyperlipidemia, dilated bile duct NOS.  The patient presents to the [facility] for an evaluation and management of problem(s) noted below:  Problem List 1. Dilated bile duct   2. RUQ abdominal pain   3. Elevated ALT measurement   4. Chronic nausea   5. Other constipation    Discussed the use of AI scribe software for clinical note transcription with the patient, who gave verbal consent to proceed.  History of Present Illness Please see prior GI notes for full details of HPI.  Interval History Bethany Clarke is a 68 year old female who presents for evaluation of abnormal imaging and abdominal pain and nausea.  She is a patient of Dr. Nancyann.  Her symptoms began in May with episodes of nausea eventually leading to vomiting yellow bile. She experienced lethargy and eventually was seen by PCP and found to have significant TSH elevation and has subsequently had medication adjustment.  During the last few months however, she is now having intermittent changes in her electrolytes with more recent issues of hyponatremia.  She underwent imaging (see all below) which did show evidence of biliary ductal dilation of unclear etiology and a normal gallbladder.  LFTs mostly within the normal range though ALT has risen on 2 occasions in 3 months.  She is currently taking Zofran  three times a day to manage nausea. She reports that she now mostly experiences dry heaves rather than further vomiting.  She describes intermittent sharp abdominal pain occurring two to three times a day, lasting a few minutes, and not associated with food  intake or stress. The pain is localized but occasionally moves slightly more epigastric. She avoids rich, fatty, and fried foods.  Her family history includes gallbladder issues in her siblings and alcohol-related cirrhosis of the liver in her father. There is no family history of colon cancer, and she is up to date with her colon cancer screening via Cologuard testing in 2024.  No blood has been noted in her stools.   GI Review of Systems Positive as above Negative for dysphagia, odynophagia, melena, hematochezia  Review of Systems General: Denies fevers/chills Cardiovascular: Denies chest pain Pulmonary: Denies shortness of breath Gastroenterological: See HPI Genitourinary: Denies darkened urine Hematological: Denies easy bruising/bleeding Dermatological: Denies jaundice Psychological: Mood is stable  Medications Current Outpatient Medications  Medication Sig Dispense Refill   aspirin  EC 81 MG tablet Take 81 mg by mouth daily. Swallow whole.     atorvastatin  (LIPITOR) 40 MG tablet TAKE 1 TABLET BY MOUTH EVERY DAY 90 tablet 2   clopidogrel  (PLAVIX ) 75 MG tablet TAKE 1 TABLET BY MOUTH EVERY DAY 90 tablet 2   FLUoxetine  (PROZAC ) 40 MG capsule TAKE 1 CAPSULE (40 MG TOTAL) BY MOUTH DAILY. 90 capsule 3   levothyroxine  (SYNTHROID ) 100 MCG tablet Take 1 tablet (100 mcg total) by mouth daily. 90 tablet 3   ondansetron  (ZOFRAN ) 4 MG tablet Take 1 tablet (4 mg total) by mouth every 8 (eight) hours as needed for nausea or vomiting. 20 tablet 0   No current facility-administered medications for this visit.    Allergies No Known Allergies  Histories Past Medical History:  Diagnosis Date   Atherosclerosis 11/14/2005  carotid US  - mild calcific non-stenotic plaque bilaterally   Carotid bruit 02/05/2009   Doppler - R/L ICAs 0-49% diameter reductin (velocities suggest low-mid range); L subclavian 0-49%  diameter reduction   Chronic bronchitis (HCC)    growing up; not anymore (03/14/2013)    Colon polyp    hyperplastic   Costochondritis    Dizziness    Emphysema of lung (HCC)    GERD (gastroesophageal reflux disease)    H. pylori infection    Heart burn    Hemorrhoids    Hypothyroid    Lipidemia    Pneumonia ~ 2009   Subclavian artery stenosis, left    subclavian steal physiology status post PTA and stenting   Syncope, near    Past Surgical History:  Procedure Laterality Date   ABDOMINAL HYSTERECTOMY  1999   BILATERAL UPPER EXTREMITY ANGIOGRAM N/A 03/14/2013   Procedure: BILATERAL UPPER EXTREMITY ANGIOGRAM;  Surgeon: Dorn JINNY Lesches, MD;  Location: [facility] CATH LAB;  Service: Cardiovascular;  Laterality: N/A;   BREAST BIOPSY Right 1990's   benign (03/14/2013)   CARDIAC CATHETERIZATION  03/03/2013   DILATION AND CURETTAGE OF UTERUS  1980's   KNEE ARTHROSCOPY WITH MENISCAL REPAIR Left 07/21/2021   Procedure: Arthroscopic debridement, arthroscopic debridement of bucket-handle component of lateral meniscus tear, and arthroscopically-assisted repair of lateral meniscus root tear, left knee. ;  Surgeon: Edie Norleen JINNY, MD;  Location: [facility] ORS;  Service: Orthopedics;  Laterality: Left;   LEFT HEART CATHETERIZATION WITH CORONARY ANGIOGRAM Bilateral 03/03/2013   Procedure: LEFT HEART CATHETERIZATION WITH CORONARY ANGIOGRAM;  Surgeon: Debby DELENA Sor, MD;  Location: [facility] CATH LAB;  Service: Cardiovascular;  Laterality: Bilateral;   PERCUTANEOUS STENT INTERVENTION  03/14/2013   Procedure: PERCUTANEOUS STENT INTERVENTION;  Surgeon: Dorn JINNY Lesches, MD;  Location: [facility] CATH LAB;  Service: Cardiovascular;;   SUBCLAVIAN ARTERY STENT Left 03/14/2013    for subclavian steal/claudication (03/14/2013)   TUBAL LIGATION  1980's   Social History   Socioeconomic History   Marital status: Married    Spouse name: Deward   Number of children: 3   Years of education: Not on file   Highest education level: Some college, no degree  Occupational History   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 Married. Education: [employer]. Exercise: No.      Patient is right-handed. She lives with her husband in a two level home. She drinks 2 cups of coffee a day. 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   Social Drivers of Corporate investment banker Strain: Low Risk  (08/14/2023)   Overall Financial Resource Strain (CARDIA)    Difficulty of Paying Living Expenses: Not hard at all  Food Insecurity: No Food Insecurity (08/14/2023)   Hunger Vital Sign    Worried About Running Out  of Food in the Last Year: Never true    Ran Out of Food in the Last Year: Never true  Transportation Needs: No Transportation Needs (08/14/2023)   PRAPARE - Administrator, Civil Service (Medical): No    Lack of Transportation (Non-Medical): No  Physical Activity: Sufficiently Active (08/14/2023)   Exercise Vital Sign    Days of Exercise per Week: 5 days    Minutes of Exercise per Session: 60 min  Stress: No Stress Concern Present (08/14/2023)   [employer] of Occupational Health - Occupational Stress Questionnaire    Feeling of Stress : Not at all  Social Connections: Moderately Integrated (08/14/2023)   Social Connection and Isolation Panel    Frequency of Communication with Friends and Family: Not on file    Frequency of Social Gatherings with Friends and Family: More than three times a week    Attends Religious Services: Never     Database administrator or Organizations: Yes    Attends Engineer, structural: More than 4 times per year    Marital Status: Married  Catering manager Violence: Not At Risk (08/14/2023)   Humiliation, Afraid, Rape, and Kick questionnaire    Fear of Current or Ex-Partner: No    Emotionally Abused: No    Physically Abused: No    Sexually Abused: No   Family History  Problem Relation Age of Onset   Diabetes Mother    Heart disease Mother    Stroke Mother    Liver disease Father    Liver cancer Father    Cancer Father        cirrohosis   Hypertension Father    Heart disease Sister    Diabetes Sister    Hyperlipidemia Sister    Hypertension Sister    COPD Sister    Arthritis Sister        rheumatoid   Diabetes Sister    Arthritis Sister        rheumatoid   Cancer Brother        spine/lung   COPD Brother        smoked   Hypertension Brother    Rheum arthritis Brother    Hyperlipidemia Brother    Hypertension Brother    Colon cancer Neg Hx    Esophageal cancer Neg Hx    Inflammatory bowel disease Neg Hx    Pancreatic cancer Neg Hx    Rectal cancer Neg Hx    Stomach cancer Neg Hx    I have reviewed her medical, social, and family history in detail and updated the electronic medical record as necessary.    PHYSICAL EXAMINATION  BP 100/70 (BP Location: Left Arm, Patient Position: Sitting, Cuff Size: Normal)   Pulse 68   Ht 5' 3.75 (1.619 m) Comment: height measured without shoes  Wt 124 lb (56.2 kg)   BMI 21.45 kg/m  Wt Readings from Last 3 Encounters:  08/01/24 124 lb (56.2 kg)  07/30/24 125 lb 12.8 oz (57.1 kg)  07/24/24 123 lb (55.8 kg)  GEN: NAD, appears stated age, doesn't appear chronically ill PSYCH: Cooperative, without pressured speech EYE: Conjunctivae pink, sclerae anicteric ENT: MMM CV: Nontachycardic RESP: No audible wheezing GI: NABS, soft, NT/ND, without rebound MSK/EXT: No significant lower extremity edema SKIN: No jaundice NEURO:   Alert & Oriented x 3, no focal deficits   REVIEW OF DATA  I reviewed the following data at the time of this encounter:  GI Procedures and Studies  2024 Cologuard negative  Laboratory Studies  Reviewed those in epic  Imaging Studies  August 2025 MRCP IMPRESSION: 1. Mild central intrahepatic biliary dilatation and moderate common bile duct dilatation as demonstrated on the recent CT scan. No obvious cause is identified. No common bile duct stones, ampullary or pancreatic lesion. Possible distal common bile duct stricture. 2. Normal gallbladder. 3. No pancreatic mass, inflammation or ductal dilatation.  August 2025 CTAP  IMPRESSION: Biliary ductal dilatation, with common bile duct measuring 17 mm. Tapering of distal common bile duct in pancreatic head, suggesting possible stricture. Recommend correlation with liver function tests, and consider further evaluation with abdomen MRI and MRCP without and with contrast. No radiographic evidence of pancreatitis or pancreatic mass. Tiny bilateral renal calculi. No evidence of ureteral calculi or hydronephrosis.    ASSESSMENT/PLAN  Bethany Clarke is a 68 y.o. female.  The patient is seen today for evaluation and management of:  1. Dilated bile duct   2. RUQ abdominal pain   3. Elevated ALT measurement   4. Chronic nausea   5. Other constipation    The patient is hemodynamically stable.  Clinically additional workup is required.   Dilated CBD Common bile duct measured at 12 mm needs more clarity.  Slight ALT elevation could be completely separate issue but with biliary dilation need to rule out an obstructive process such as choledocholithiasis or microlithiasis.  Other issues such as ampullary lesions or mass or choledochal need to be considered as well.  Upper EUS is recommended.  Risks of infection/aspiration/bleeding/perforation/pancreatitis (2% risk) were discussed with the patient today and she agrees to this further  evaluation. - Proceed with scheduling Upper EUS - Coordinate with Doctor Landy for Plavix  hold for 5 days prior to procedure. - Ensure anesthesia is aware of her preference to keep dentures in place during procedure.   Abdominal pain with intermittent nausea and dry heaves Will evaluate issues as noted above with Upper EUS and with EGD rule out PUD.  Persistent nausea managed with Zofran . - Perform endoscopy with EUS.    Constipation likely secondary to hypothyroidism Constipation likely related to hypothyroidism, as thyroid  levels are not yet normalized. No current use of laxatives or fiber supplements. - Recommend starting Colace daily - Start Fibercon or Benefiber or MiraLAX daily - If still struggling, then consider MiraLAX PRN - Not clear if colonoscopy is required until thyroid  is optimized    Orders Placed This Encounter  Procedures   Procedural/ Surgical Case Request: ULTRASOUND, UPPER GI TRACT, ENDOSCOPIC   Ambulatory referral to Gastroenterology    New Prescriptions   No medications on file   Modified Medications   No medications on file    Planned Follow Up No follow-ups on file.   Total Time in Face-to-Face and in Coordination of Care for patient including independent/personal interpretation/review of prior testing, medical history, examination, medication adjustment, communicating results with the patient directly, and documentation within the EHR is 45 minutes.   Aloha Finner, MD [facility] Office # [phone]

## 2024-08-01 NOTE — Telephone Encounter (Signed)
  DWAN FENNEL Dec 26, 1955 [id number]     Dear Dr. Levora:  We have scheduled the above named patient for a(n) Upper EUS procedure. Our records show that (s)he is on anticoagulation therapy.  Please advise as to whether the patient may come off their therapy of Plavix  5 days prior to their procedure which is scheduled for 09/29/24.  Please route your response to Alan Fusi, CMA or fax response to [phone].  Sincerely,    [facility]

## 2024-08-01 NOTE — Patient Instructions (Signed)
 You have been scheduled for an upper endoscopic ultrasound. Please follow written instructions given to you at your visit today.  If you use inhalers (even only as needed), please bring them with you on the day of your procedure.  If you take any of the following medications, they will need to be adjusted prior to your procedure:   DO NOT TAKE 7 DAYS PRIOR TO TEST- Trulicity (dulaglutide) Ozempic, Wegovy (semaglutide) Mounjaro (tirzepatide) Bydureon Bcise (exanatide extended release)  DO NOT TAKE 1 DAY PRIOR TO YOUR TEST Rybelsus (semaglutide) Adlyxin (lixisenatide) Victoza (liraglutide) Byetta (exanatide) ___________________________________________________________________________  _______________________________________________________  If your blood pressure at your visit was 140/90 or greater, please contact your primary care physician to follow up on this.  _______________________________________________________  If you are age 88 or older, your body mass index should be between 23-30. Your Body mass index is 21.45 kg/m. If this is out of the aforementioned range listed, please consider follow up with your Primary Care Provider.  If you are age 40 or younger, your body mass index should be between 19-25. Your Body mass index is 21.45 kg/m. If this is out of the aformentioned range listed, please consider follow up with your Primary Care Provider.   ________________________________________________________  The [facility] providers would like to encourage you to use MYCHART to communicate with providers for non-urgent requests or questions.  Due to long hold times on the telephone, sending your provider a message by [facility] may be a faster and more efficient way to get a response.  Please allow 48 business hours for a response.  Please remember that this is for non-urgent requests.  _______________________________________________________  [facility] is using a  team-based approach to care.  Your team is made up of your doctor and two to three APPS. Our APPS (Nurse Practitioners and Physician Assistants) work with your physician to ensure care continuity for you. They are fully qualified to address your health concerns and develop a treatment plan. They communicate directly with your gastroenterologist to care for you. Seeing the Advanced Practice Practitioners on your physician's team can help you by facilitating care more promptly, often allowing for earlier appointments, access to diagnostic testing, procedures, and other specialty referrals.

## 2024-08-01 NOTE — Telephone Encounter (Signed)
 Recent visit with Dr. Verlin on October 15.  Will forward this request to her cardiologist to decide on holding of Plavix , anticoagulation for planned procedure.  Thanks

## 2024-08-05 NOTE — Telephone Encounter (Signed)
 Left message for patient to return my call.

## 2024-08-06 NOTE — Telephone Encounter (Signed)
Informed patient she can hold Plavix 5 days prior to her procedure. Patient verbalized understanding.

## 2024-08-11 ENCOUNTER — Emergency Department
Admission: EM | Admit: 2024-08-11 | Discharge: 2024-08-11 | Disposition: A | Discharge status: Home / Self Care | Attending: Emergency Medicine | Admitting: Emergency Medicine

## 2024-08-11 ENCOUNTER — Emergency Department

## 2024-08-11 ENCOUNTER — Other Ambulatory Visit: Payer: Self-pay

## 2024-08-11 DIAGNOSIS — E871 Hypo-osmolality and hyponatremia: Secondary | ICD-10-CM | POA: Diagnosis not present

## 2024-08-11 DIAGNOSIS — E860 Dehydration: Secondary | ICD-10-CM | POA: Insufficient documentation

## 2024-08-11 DIAGNOSIS — J449 Chronic obstructive pulmonary disease, unspecified: Secondary | ICD-10-CM | POA: Diagnosis not present

## 2024-08-11 DIAGNOSIS — R110 Nausea: Secondary | ICD-10-CM | POA: Insufficient documentation

## 2024-08-11 DIAGNOSIS — Z7901 Long term (current) use of anticoagulants: Secondary | ICD-10-CM | POA: Diagnosis not present

## 2024-08-11 LAB — URINALYSIS, W/ REFLEX TO CULTURE (INFECTION SUSPECTED)
Bilirubin Urine: NEGATIVE
Glucose, UA: NEGATIVE mg/dL
Hgb urine dipstick: NEGATIVE
Ketones, ur: NEGATIVE mg/dL
Leukocytes,Ua: NEGATIVE
Nitrite: NEGATIVE
Protein, ur: NEGATIVE mg/dL
Specific Gravity, Urine: 1.005 (ref 1.005–1.030)
Squamous Epithelial / HPF: 0 /HPF (ref 0–5)
pH: 7 (ref 5.0–8.0)

## 2024-08-11 LAB — CBC WITH DIFFERENTIAL/PLATELET
Abs Immature Granulocytes: 0.01 K/uL (ref 0.00–0.07)
Basophils Absolute: 0 K/uL (ref 0.0–0.1)
Basophils Relative: 0 %
Eosinophils Absolute: 0 K/uL (ref 0.0–0.5)
Eosinophils Relative: 0 %
HCT: 40.1 % (ref 36.0–46.0)
Hemoglobin: 13.3 g/dL (ref 12.0–15.0)
Immature Granulocytes: 0 %
Lymphocytes Relative: 27 %
Lymphs Abs: 1.5 K/uL (ref 0.7–4.0)
MCH: 33 pg (ref 26.0–34.0)
MCHC: 33.2 g/dL (ref 30.0–36.0)
MCV: 99.5 fL (ref 80.0–100.0)
Monocytes Absolute: 0.4 K/uL (ref 0.1–1.0)
Monocytes Relative: 8 %
Neutro Abs: 3.6 K/uL (ref 1.7–7.7)
Neutrophils Relative %: 65 %
Platelets: 275 K/uL (ref 150–400)
RBC: 4.03 MIL/uL (ref 3.87–5.11)
RDW: 11.3 % — ABNORMAL LOW (ref 11.5–15.5)
WBC: 5.5 K/uL (ref 4.0–10.5)
nRBC: 0 % (ref 0.0–0.2)

## 2024-08-11 LAB — COMPREHENSIVE METABOLIC PANEL WITH GFR
ALT: 49 U/L — ABNORMAL HIGH (ref 0–44)
AST: 39 U/L (ref 15–41)
Albumin: 4.1 g/dL (ref 3.5–5.0)
Alkaline Phosphatase: 70 U/L (ref 38–126)
Anion gap: 9 (ref 5–15)
BUN: 6 mg/dL — ABNORMAL LOW (ref 8–23)
CO2: 27 mmol/L (ref 22–32)
Calcium: 9.6 mg/dL (ref 8.9–10.3)
Chloride: 94 mmol/L — ABNORMAL LOW (ref 98–111)
Creatinine, Ser: 0.65 mg/dL (ref 0.44–1.00)
GFR, Estimated: >60 mL/min (ref >60)
Glucose, Bld: 87 mg/dL (ref 70–99)
Potassium: 4.2 mmol/L (ref 3.5–5.1)
Sodium: 130 mmol/L — ABNORMAL LOW (ref 135–145)
Total Bilirubin: 0.4 mg/dL (ref 0.0–1.2)
Total Protein: 7.9 g/dL (ref 6.5–8.1)

## 2024-08-11 LAB — LIPASE, BLOOD: Lipase: 67 U/L — ABNORMAL HIGH (ref 11–51)

## 2024-08-11 LAB — TROPONIN I (HIGH SENSITIVITY): Troponin I (High Sensitivity): <2 ng/L (ref <18)

## 2024-08-11 MED ORDER — ONDANSETRON HCL 4 MG/2ML IJ SOLN
4.0000 mg | Freq: Once | INTRAMUSCULAR | Status: AC
  Administered 2024-08-11: 4 mg via INTRAVENOUS
  Filled 2024-08-11: qty 2

## 2024-08-11 MED ORDER — SODIUM CHLORIDE 0.9 % IV BOLUS
1000.0000 mL | Freq: Once | INTRAVENOUS | Status: AC
  Administered 2024-08-11: 1000 mL via INTRAVENOUS

## 2024-08-11 MED ORDER — CEPHALEXIN 500 MG PO CAPS: 500.0000 mg | ORAL_CAPSULE | Freq: Two times a day (BID) | ORAL | 0 refills | Status: DC

## 2024-08-11 MED ORDER — ONDANSETRON HCL 4 MG PO TABS: 4.0000 mg | ORAL_TABLET | Freq: Three times a day (TID) | ORAL | 0 refills | Status: AC | PRN

## 2024-08-11 NOTE — ED Triage Notes (Signed)
 C/O nausea.  States seen through ED several weeks ago for same. Hx low sodium and dehydration. Denies emesis.Taking Zofran  without relief.  Has appointment scheduled for GI in  December..  Also c/o pain after urination last night, no complaint of dysuria today.

## 2024-08-11 NOTE — ED Provider Notes (Signed)
 [facility] Provider Note    Event Date/Time   First MD Initiated Contact with Patient 08/11/24 1029     (approximate)   History   Nausea   HPI  Bethany Clarke is a 68 y.o. female with history of COPD, GERD, chronic nausea, presenting with nausea, dry heaving, concerned that she is dehydrated.  States that this was ongoing for a while, has had decreased p.o. intake and dry heaving in the last couple days.  States that she was able to drink several bottles of electrolytes yesterday.  Did note some dysuria yesterday but no new today.  States that she has some suprapubic cramping.  States that she has some chest pressure yesterday but no chest pain or shortness of breath today.  No fever, no diarrhea, no vomiting.  On independent chart review, she was seen by gastroenterology in mid October, has history of subclavian artery stenosis on Plavix , history of dilated bile duct and OS, she has a an MRCP that was done on August 2025 that showed mild central intrahepatic biliary dilatation and moderate common bile duct dilatation, no choledocholithiasis. Possible distal CBD stricture.  She is scheduled for an upper EUS.     Physical Exam   Triage Vital Signs: ED Triage Vitals  Encounter Vitals Group     BP 08/11/24 1025 (!) 140/88     Girls Systolic BP Percentile --      Girls Diastolic BP Percentile --      Boys Systolic BP Percentile --      Boys Diastolic BP Percentile --      Pulse Rate 08/11/24 1025 73     Resp 08/11/24 1025 16     Temp 08/11/24 1025 97.6 F (36.4 C)     Temp Source 08/11/24 1025 Oral     SpO2 08/11/24 1025 96 %     Weight 08/11/24 1026 124 lb 1.9 oz (56.3 kg)     Height --      Head Circumference --      Peak Flow --      Pain Score 08/11/24 1026 0     Pain Loc --      Pain Education --      Exclude from Growth Chart --     Most recent vital signs: Vitals:   08/11/24 1300 08/11/24 1330  BP: 121/69 119/61  Pulse: 67 64   Resp: 18 12  Temp:    SpO2: 96% 97%     General: Awake, no distress. CV:  Good peripheral perfusion.  Resp:  Normal effort.  Abd:  No distention.  Soft nontender Other:  Ambulatory   ED Results / Procedures / Treatments   Labs (all labs ordered are listed, but only abnormal results are displayed) Labs Reviewed  COMPREHENSIVE METABOLIC PANEL WITH GFR - Abnormal; Notable for the following components:      Result Value   Sodium 130 (*)    Chloride 94 (*)    BUN 6 (*)    ALT 49 (*)    All other components within normal limits  CBC WITH DIFFERENTIAL/PLATELET - Abnormal; Notable for the following components:   RDW 11.3 (*)    All other components within normal limits  URINALYSIS, W/ REFLEX TO CULTURE (INFECTION SUSPECTED) - Abnormal; Notable for the following components:   Color, Urine STRAW (*)    APPearance CLEAR (*)    Bacteria, UA RARE (*)    All other components within normal limits  LIPASE,  BLOOD - Abnormal; Notable for the following components:   Lipase 67 (*)    All other components within normal limits  TROPONIN I (HIGH SENSITIVITY)     EKG  EKG shows, sinus rhythm heart rate 71, normal QS, normal QTc, no obvious ischemic ST elevation, T wave flattening to aVL, T wave version to V2, T wave changes to compare to prior     PROCEDURES:  Critical Care performed: No  Procedures   MEDICATIONS ORDERED IN ED: Medications  sodium chloride  0.9 % bolus 1,000 mL (0 mLs Intravenous Stopped 08/11/24 1316)  ondansetron  (ZOFRAN ) injection 4 mg (4 mg Intravenous Given 08/11/24 1134)     IMPRESSION / MDM / ASSESSMENT AND PLAN / ED COURSE  I reviewed the triage vital signs and the nursing notes.                              Differential diagnosis includes, but is not limited to, gastroenteritis, GERD, gastritis, atypical ACS, UTI, dehydration, electrolyte derangements.  Will get labs, UA, EKG, troponin, chest x-ray, IV fluids, IV Zofran .  Patient's presentation  is most consistent with acute presentation with potential threat to life or bodily function.  Independent interpretation of labs below.  On reassessment patient is feeling significantly better, would like to go home, states that she has antinausea medications at home, does not need a refill.  Will follow-up with the primary care doctor next week to get reassessed.  Encouraged hydration.  Considered but no indication for inpatient admission at this time, she safe for outpatient management.  Will discharge with strict return precautions.  Shared decision making done with patient and she is agreeable with this plan.    Clinical Course as of 08/11/24 1350  Mon Aug 11, 2024  1120 Independent review of labs, troponins not elevated, she is mildly hyponatremic, could be related to dehydration, will give her some fluids here as well as antinausea medication, her ALT is mildly elevated and her lipase is mildly elevated but this appears stable.  T. bili is not elevated, no leukocytosis. [TT]  1346 Urinalysis, w/ Reflex to Culture (Infection Suspected) -Urine, Clean Catch(!) Not consistent with UTI. [TT]    Clinical Course User Index [TT] Waymond, Lorelle Cummins, MD     FINAL CLINICAL IMPRESSION(S) / ED DIAGNOSES   Final diagnoses:  Nausea  Dehydration  Hyponatremia     Rx / DC Orders   ED Discharge Orders          Ordered    cephALEXin (KEFLEX) 500 MG capsule  2 times daily,   Status:  Discontinued        08/11/24 1137    ondansetron  (ZOFRAN ) 4 MG tablet  Every 8 hours PRN        08/11/24 1138             Note:  This document was prepared using Dragon voice recognition software and may include unintentional dictation errors.    Waymond Lorelle Cummins, MD 08/11/24 1350

## 2024-08-12 ENCOUNTER — Telehealth: Payer: Self-pay | Admitting: Gastroenterology

## 2024-08-12 ENCOUNTER — Telehealth: Payer: Self-pay

## 2024-08-12 ENCOUNTER — Encounter: Payer: Self-pay | Admitting: Cardiovascular Disease

## 2024-08-12 NOTE — Telephone Encounter (Signed)
 Inbound call from patient requesting to have her appointment for December the 15 th due to her going back and fourth to the ER and not having any kind of answer on what is going on with her. Patient is requesting a call back. Please advise.

## 2024-08-12 NOTE — Telephone Encounter (Signed)
 Patient has appt in December for med check and labs. Will check at that appt   Copied from CRM [id number]. Topic: Clinical - Request for Lab/Test Order >> Aug 12, 2024  1:23 PM Alfonso ORN wrote: Reason for CRM: pt calling to request appt for bloodwork to get thyroid  check [phone] (H)

## 2024-08-12 NOTE — Telephone Encounter (Signed)
 The pt has been advised that we have no sooner appts available.  She has been added to the list to call if we have any cancellations.

## 2024-08-13 NOTE — Telephone Encounter (Signed)
 Patient responded. Patient is seeing you in December for medication check and labs

## 2024-08-14 NOTE — Addendum Note (Signed)
 Addended by: Catrice Zuleta R on: 08/14/2024 09:43 AM   Modules accepted: Orders

## 2024-08-18 ENCOUNTER — Ambulatory Visit: Payer: Self-pay | Admitting: Family Medicine

## 2024-08-18 ENCOUNTER — Other Ambulatory Visit

## 2024-08-18 DIAGNOSIS — E039 Hypothyroidism, unspecified: Secondary | ICD-10-CM

## 2024-08-18 LAB — TSH: TSH: 1.73 u[IU]/mL (ref 0.35–5.50)

## 2024-09-03 ENCOUNTER — Ambulatory Visit: Payer: Self-pay

## 2024-09-03 ENCOUNTER — Encounter: Payer: Self-pay | Admitting: Family Medicine

## 2024-09-03 ENCOUNTER — Ambulatory Visit (INDEPENDENT_AMBULATORY_CARE_PROVIDER_SITE_OTHER): Admitting: Family Medicine

## 2024-09-03 VITALS — BP 110/72 | HR 71 | Temp 98.6°F | Resp 14 | Ht 63.75 in | Wt 119.8 lb

## 2024-09-03 DIAGNOSIS — L819 Disorder of pigmentation, unspecified: Secondary | ICD-10-CM | POA: Diagnosis not present

## 2024-09-03 DIAGNOSIS — K5909 Other constipation: Secondary | ICD-10-CM

## 2024-09-03 NOTE — Progress Notes (Unsigned)
 Subjective:  Patient ID: Bethany Clarke, female    DOB: 03/02/1956  Age: 68 y.o. MRN: [id number]  CC:  Chief Complaint  Patient presents with   Constipation    Sx started awhile ago. Tried miralax, it did not help. Wondering if she is dehydrated.    Hand Pain    Right hand on thumb and pointer finger is purple/blue. Painful and numb. Does not have feeling. Sx started awhile ago.     HPI [name] presents for  Acute visit for above.  See nurse triage note.  Constipation For past few months.  Had taken miralax daily for 8 days - last week. Had 1 bowel movement.  Small stool today after sitting on toilet for 1 hour.  No meds for constipation in past week. Prior BM 1 week ago.  No vomiting. Only slight nausea. After eating, sore in lower abdomen. No fever.  Drinking fluids. Still drinking electrolytes. Trying to keep hydrated and keep sodium up. ER visit on 10/27. Na 130 at that time. IV fluids given, felt better.  Has not felt that bad recently.   Right finger discoloration and hand pain. Intermittently - past few months - intermittently turns purple, dark. R thumb, 2nd phalynx fingertip. Not associated with cold weather, etc. Random times. Painful when occurs. Appears normal now, dark when came in office.   Quit smoking 11 yrs ago.    History Patient Active Problem List   Diagnosis Date Noted   Other constipation 08/01/2024   Chronic nausea 08/01/2024   RUQ abdominal pain 08/01/2024   Dilated bile duct 08/01/2024   Neck pain on right side 10/04/2022   Cervico-occipital neuralgia of right side 10/04/2022   Pain in joints of left hand 07/17/2019   Chest pain, atypical 03/17/2016   Paresthesia 07/14/2013   Bilateral foot pain 05/01/2013   Subclavian artery stenosis, left    Subclavian steal syndrome 03/16/2013   Subclavian arterial stenosis (HCC) 03/11/2013   Carotid stenosis 03/11/2013   Hyperlipidemia with target LDL less than 70 03/11/2013   GERD  (gastroesophageal reflux disease)    Hypothyroid    Dizziness    Syncope, near    Heart burn    Hypothyroidism 05/28/2007   COPD GOLD II with reversibility  05/28/2007   Past Medical History:  Diagnosis Date   Atherosclerosis 11/14/2005   carotid US  - mild calcific non-stenotic plaque bilaterally   Carotid bruit 02/05/2009   Doppler - R/L ICAs 0-49% diameter reductin (velocities suggest low-mid range); L subclavian 0-49%  diameter reduction   Chronic bronchitis (HCC)    growing up; not anymore (03/14/2013)   Colon polyp    hyperplastic   Costochondritis    Dizziness    Emphysema of lung (HCC)    GERD (gastroesophageal reflux disease)    H. pylori infection    Heart burn    Hemorrhoids    Hypothyroid    Lipidemia    Pneumonia ~ 2009   Subclavian artery stenosis, left    subclavian steal physiology status post PTA and stenting   Syncope, near    Past Surgical History:  Procedure Laterality Date   ABDOMINAL HYSTERECTOMY  1999   BILATERAL UPPER EXTREMITY ANGIOGRAM N/A 03/14/2013   Procedure: BILATERAL UPPER EXTREMITY ANGIOGRAM;  Surgeon: Dorn JINNY Lesches, MD;  Location: [facility] CATH LAB;  Service: Cardiovascular;  Laterality: N/A;   BREAST BIOPSY Right 1990's   benign (03/14/2013)   CARDIAC CATHETERIZATION  03/03/2013   DILATION AND CURETTAGE OF UTERUS  1980's   KNEE ARTHROSCOPY WITH MENISCAL REPAIR Left 07/21/2021   Procedure: Arthroscopic debridement, arthroscopic debridement of bucket-handle component of lateral meniscus tear, and arthroscopically-assisted repair of lateral meniscus root tear, left knee. ;  Surgeon: Edie Norleen PARAS, MD;  Location: [facility] ORS;  Service: Orthopedics;  Laterality: Left;   LEFT HEART CATHETERIZATION WITH CORONARY ANGIOGRAM Bilateral 03/03/2013   Procedure: LEFT HEART CATHETERIZATION WITH CORONARY ANGIOGRAM;  Surgeon: Debby DELENA Sor, MD;  Location: [facility] CATH LAB;  Service: Cardiovascular;  Laterality: Bilateral;   PERCUTANEOUS STENT INTERVENTION  03/14/2013    Procedure: PERCUTANEOUS STENT INTERVENTION;  Surgeon: Dorn PARAS Lesches, MD;  Location: [facility] CATH LAB;  Service: Cardiovascular;;   SUBCLAVIAN ARTERY STENT Left 03/14/2013    for subclavian steal/claudication (03/14/2013)   TUBAL LIGATION  1980's   No Known Allergies Prior to Admission medications   Medication Sig Start Date End Date Taking? Authorizing Provider  aspirin  EC 81 MG tablet Take 81 mg by mouth daily. Swallow whole.   Yes [provider]  atorvastatin  (LIPITOR) 40 MG tablet TAKE 1 TABLET BY MOUTH EVERY DAY 03/03/24  Yes Levora Reyes SAUNDERS, MD  clopidogrel  (PLAVIX ) 75 MG tablet TAKE 1 TABLET BY MOUTH EVERY DAY 04/07/24  Yes Levora Reyes SAUNDERS, MD  FLUoxetine  (PROZAC ) 40 MG capsule TAKE 1 CAPSULE (40 MG TOTAL) BY MOUTH DAILY. 07/09/24  Yes Levora Reyes SAUNDERS, MD  levothyroxine  (SYNTHROID ) 100 MCG tablet Take 1 tablet (100 mcg total) by mouth daily. 04/10/24  Yes Levora Reyes SAUNDERS, MD  ondansetron  (ZOFRAN ) 4 MG tablet Take 1 tablet (4 mg total) by mouth every 8 (eight) hours as needed for nausea or vomiting. 08/11/24  Yes Waymond Lorelle Cummins, MD   Social History   Socioeconomic History   Marital status: Married    Spouse name: Deward   Number of children: 3   Years of education: Not on file   Highest education level: Some college, no degree  Occupational History   Occupation: Orthoptist: [employer]  Tobacco Use   Smoking status: Former    Current packs/day: 0.00    Average packs/day: 0.5 packs/day for 40.0 years (20.0 ttl pk-yrs)    Types: Cigarettes    Start date: 02/11/1973    Quit date: 02/11/2013    Years since quitting: 11.5   Smokeless tobacco: Never  Vaping Use   Vaping status: Never Used  Substance and Sexual Activity   Alcohol use: No   Drug use: No   Sexual activity: Yes    Comment: number of sex partners in the last 12 months  1  Other Topics Concern   Not on file  Social History Narrative   Daily caffeine. Married. Education: [employer].  Exercise: No.      Patient is right-handed. She lives with her husband in a two level home. She drinks 2 cups of coffee a day. 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   Social Drivers of Corporate Investment Banker Strain: Low Risk  (08/14/2023)   Overall Financial Resource Strain (CARDIA)    Difficulty of Paying Living Expenses: Not hard at all  Food Insecurity: No Food Insecurity (08/14/2023)   Hunger Vital Sign    Worried About Running Out of Food in the Last Year: Never true    Ran Out of Food in the Last Year: Never true  Transportation Needs: No Transportation Needs (08/14/2023)   PRAPARE - Administrator, Civil Service (Medical): No  Lack of Transportation (Non-Medical): No  Physical Activity: Sufficiently Active (08/14/2023)   Exercise Vital Sign    Days of Exercise per Week: 5 days    Minutes of Exercise per Session: 60 min  Stress: No Stress Concern Present (08/14/2023)   [employer] of Occupational Health - Occupational Stress Questionnaire    Feeling of Stress : Not at all  Social Connections: Moderately Integrated (08/14/2023)   Social Connection and Isolation Panel    Frequency of Communication with Friends and Family: Not on file    Frequency of Social Gatherings with Friends and Family: More than three times a week    Attends Religious Services: Never    Database Administrator or Organizations: Yes    Attends Engineer, Structural: More than 4 times per year    Marital Status: Married  Catering Manager Violence: Not At Risk (08/14/2023)   Humiliation, Afraid, Rape, and Kick questionnaire    Fear of Current or Ex-Partner: No    Emotionally Abused: No    Physically Abused: No    Sexually Abused: No    Review of Systems Per HPI  Objective:   Vitals:   09/03/24 1632  BP: 110/72  Pulse: 71  Resp: 14  Temp: 98.6 F (37 C)  TempSrc: Temporal  SpO2: 96%  Weight: 119 lb 12.8 oz (54.3 kg)  Height: 5' 3.75 (1.619 m)      Physical Exam Vitals reviewed.  Constitutional:      Appearance: Normal appearance. She is well-developed.  HENT:     Head: Normocephalic and atraumatic.  Eyes:     Conjunctiva/sclera: Conjunctivae normal.     Pupils: Pupils are equal, round, and reactive to light.  Neck:     Vascular: No carotid bruit.  Cardiovascular:     Rate and Rhythm: Normal rate and regular rhythm.     Heart sounds: Normal heart sounds.  Pulmonary:     Effort: Pulmonary effort is normal.     Breath sounds: Normal breath sounds.  Abdominal:     Palpations: Abdomen is soft. There is no pulsatile mass.     Tenderness: There is abdominal tenderness (llq discomfort, fullness).  Musculoskeletal:     Right lower leg: No edema.     Left lower leg: No edema.  Skin:    General: Skin is warm and dry.     Comments: Fingertips warm, cap refill less than 2s. Warm, no cyanosis.   Neurological:     Mental Status: She is alert and oriented to person, place, and time.  Psychiatric:        Mood and Affect: Mood normal.        Behavior: Behavior normal.     Assessment & Plan:  BRUNILDA EBLE is a 69 y.o. female . Other constipation  Discoloration of skin of finger   No orders of the defined types were placed in this encounter.  Patient Instructions  I am sorry to hear that you have been struggling with constipation.  Try the glycerin suppositories as we discussed, along with MiraLAX twice per day.  See other information below.  Give me an update tomorrow.  If we are unsuccessful with this approach, you potentially could have a fecal impaction and may need further treatment to help alleviate constipation.  Keep me posted.  I do not see any concerns on the exam of your fingers today, but I do want to follow-up on that in the next few weeks.  If you notice  any persistent discoloration be seen but again exam today was reassuring.  There is a condition called Raynaud's that sometimes can cause discoloration of  the fingers which may be causing symptoms but I would like to discuss that further at follow-up.  Hang in there.   Constipation, Adult Constipation is when a person has fewer than three bowel movements in a week, has difficulty having a bowel movement, or has stools (feces) that are dry, hard, or larger than normal. Constipation may be caused by an underlying condition. It may become worse with age if a person takes certain medicines and does not take in enough fluids. Follow these instructions at home: Eating and drinking  Eat foods that have a lot of fiber, such as beans, whole grains, and fresh fruits and vegetables. Limit foods that are low in fiber and high in fat and processed sugars, such as fried or sweet foods. These include french fries, hamburgers, cookies, candies, and soda. Drink enough fluid to keep your urine pale yellow. General instructions Exercise regularly or as told by your health care provider. Try to do 150 minutes of moderate exercise each week. Use the bathroom when you have the urge to go. Do not hold it in. Take over-the-counter and prescription medicines only as told by your health care provider. This includes any fiber supplements. During bowel movements: Practice deep breathing while relaxing the lower abdomen. Practice pelvic floor relaxation. Watch your condition for any changes. Let your health care provider know about them. Keep all follow-up visits as told by your health care provider. This is important. Contact a health care provider if: You have pain that gets worse. You have a fever. You do not have a bowel movement after 4 days. You vomit. You are not hungry or you lose weight. You are bleeding from the opening between the buttocks (anus). You have thin, pencil-like stools. Get help right away if: You have a fever and your symptoms suddenly get worse. You leak stool or have blood in your stool. Your abdomen is bloated. You have severe pain in your  abdomen. You feel dizzy or you faint. Summary Constipation is when a person has fewer than three bowel movements in a week, has difficulty having a bowel movement, or has stools (feces) that are dry, hard, or larger than normal. Eat foods that have a lot of fiber, such as beans, whole grains, and fresh fruits and vegetables. Drink enough fluid to keep your urine pale yellow. Take over-the-counter and prescription medicines only as told by your health care provider. This includes any fiber supplements. This information is not intended to replace advice given to you by your health care provider. Make sure you discuss any questions you have with your health care provider. Document Revised: 08/16/2022 Document Reviewed: 08/16/2022 Elsevier Patient Education  2024 Elsevier Inc.    Signed,   Reyes Pines, MD [facility] 09/03/24 5:19 PM

## 2024-09-03 NOTE — Patient Instructions (Signed)
 I am sorry to hear that you have been struggling with constipation.  Try the glycerin suppositories as we discussed, along with MiraLAX twice per day.  See other information below.  Give me an update tomorrow.  If we are unsuccessful with this approach, you potentially could have a fecal impaction and may need further treatment to help alleviate constipation.  Keep me posted.  I do not see any concerns on the exam of your fingers today, but I do want to follow-up on that in the next few weeks.  If you notice any persistent discoloration be seen but again exam today was reassuring.  There is a condition called Raynaud's that sometimes can cause discoloration of the fingers which may be causing symptoms but I would like to discuss that further at follow-up.  Hang in there.   Constipation, Adult Constipation is when a person has fewer than three bowel movements in a week, has difficulty having a bowel movement, or has stools (feces) that are dry, hard, or larger than normal. Constipation may be caused by an underlying condition. It may become worse with age if a person takes certain medicines and does not take in enough fluids. Follow these instructions at home: Eating and drinking  Eat foods that have a lot of fiber, such as beans, whole grains, and fresh fruits and vegetables. Limit foods that are low in fiber and high in fat and processed sugars, such as fried or sweet foods. These include french fries, hamburgers, cookies, candies, and soda. Drink enough fluid to keep your urine pale yellow. General instructions Exercise regularly or as told by your health care provider. Try to do 150 minutes of moderate exercise each week. Use the bathroom when you have the urge to go. Do not hold it in. Take over-the-counter and prescription medicines only as told by your health care provider. This includes any fiber supplements. During bowel movements: Practice deep breathing while relaxing the lower  abdomen. Practice pelvic floor relaxation. Watch your condition for any changes. Let your health care provider know about them. Keep all follow-up visits as told by your health care provider. This is important. Contact a health care provider if: You have pain that gets worse. You have a fever. You do not have a bowel movement after 4 days. You vomit. You are not hungry or you lose weight. You are bleeding from the opening between the buttocks (anus). You have thin, pencil-like stools. Get help right away if: You have a fever and your symptoms suddenly get worse. You leak stool or have blood in your stool. Your abdomen is bloated. You have severe pain in your abdomen. You feel dizzy or you faint. Summary Constipation is when a person has fewer than three bowel movements in a week, has difficulty having a bowel movement, or has stools (feces) that are dry, hard, or larger than normal. Eat foods that have a lot of fiber, such as beans, whole grains, and fresh fruits and vegetables. Drink enough fluid to keep your urine pale yellow. Take over-the-counter and prescription medicines only as told by your health care provider. This includes any fiber supplements. This information is not intended to replace advice given to you by your health care provider. Make sure you discuss any questions you have with your health care provider. Document Revised: 08/16/2022 Document Reviewed: 08/16/2022 Elsevier Patient Education  2024 [employer].

## 2024-09-03 NOTE — Telephone Encounter (Signed)
 Pt states she has discolored in 2 fingers.  She refused ER and states she will come in to have PCP to look at this first Pt reports constipation. Pt is scheduled for 4:20 today

## 2024-09-03 NOTE — Telephone Encounter (Signed)
 Pt needs and appointment.

## 2024-09-03 NOTE — Telephone Encounter (Signed)
 FYI Only or Action Required?: Action required by provider: request for appointment.  Patient was last seen in primary care on 07/02/2024 by Bethany Reyes SAUNDERS, MD.  Called Nurse Triage reporting Constipation.  Symptoms began several days ago.  Interventions attempted: OTC medications: miralax.  Symptoms are: unchanged.  Triage Disposition: See Physician Within 24 Hours  Patient/caregiver understands and will follow disposition?: No, wishes to speak with PCP   Copied from CRM #[id number]. Topic: Clinical - Red Word Triage >> Sep 03, 2024 10:56 AM [name] wrote: [facility] that prompted transfer to Nurse Triage: Patient is severely constipated for over a week and has two fingers on right hand that are numb and purple Reason for Disposition  Last bowel movement (BM) > 4 days ago  Answer Assessment - Initial Assessment Questions No avaialble appts with pcp. Patient requests work in visit with Dr. Levora.  Advised call back or UC/ED if symptoms worsen.   2 fingers turning purple(1st and 2nd digit), numbness, painful intermittently, 3 months ago onset.  1. STOOL PATTERN OR FREQUENCY: How often do you have a bowel movement (BM)?  (Normal range: 3 times a day to every 3 days)  When was your last BM?       Last BM 2 days ago, not complete, small hard stool; using Miralax 2. STRAINING: Do you have to strain to have a BM?      yes 3. ONSET: When did the constipation begin?     3 months ago 4. RECTAL PAIN: Does your rectum hurt when the stool comes out? If Yes, ask: Do you have hemorrhoids? How bad is the pain?  (Scale 1-10; or mild, moderate, severe)     No; stomach ache 5. BM COMPOSITION: Are the stools hard?      yes 6. BLOOD ON STOOLS: Has there been any blood on the toilet tissue or on the surface of the BM? If Yes, ask: When was the last time?     no 7. CHRONIC CONSTIPATION: Is this a new problem for you?  If No, ask: How long have you had this problem? (days,  weeks, months)      3 months ago 8. CHANGES IN DIET OR HYDRATION: Have there been any recent changes in your diet? How much fluids are you drinking on a daily basis?  How much have you had to drink today?     Able to eat and drink 9. MEDICINES: Have you been taking any new medicines? Are you taking any narcotic pain medicines? (e.g., Dilaudid, morphine , Percocet, Vicodin)     no 10. LAXATIVES: Have you been using any stool softeners, laxatives, or enemas?  If Yes, ask What are you using, how often, and when was the last time?       Miralax; last taken Monday 11. ACTIVITY:  How much walking do you do every day?  Has your activity level decreased in the past week?        no 12. CAUSE: What do you think is causing the constipation?        unsure 13. MEDICAL HISTORY: Do you have a history of hemorrhoids, rectal fissures, rectal surgery, or rectal abscess?         no 14. OTHER SYMPTOMS: Do you have any other symptoms? (e.g., abdomen pain, bloating, fever, vomiting)       Abdomen pain; throughout; 2/10, nausea Denies bloating, hard to touch, fever, chills, vomiting  Protocols used: Constipation-A-AH

## 2024-09-04 ENCOUNTER — Telehealth: Payer: Self-pay | Admitting: Family Medicine

## 2024-09-04 DIAGNOSIS — E871 Hypo-osmolality and hyponatremia: Secondary | ICD-10-CM

## 2024-09-04 NOTE — Telephone Encounter (Signed)
 Called patient for update and to check status.  She took miralax last night and twice today. Suppository last night. Bowel movement today at 11am. no current abd pain. Most recent tsh normal. Thyroid  should be less of a contributor to constipation. Miralax daily for now, and recommended fiber supplement daily. RTC precautions given.   Additional stressors recently.  Son Ole passed October 20th with cerebral bleed.  She did have some trouble sleeping initially and was having some difficulty initially but has been meeting with a therapist and has continued communication with that therapist.  She has remained on Prozac .  Sleeping better.  Denies any need for additional resources or medications at this time but I let her know to let me know if any additional needs during this difficult time.   She notes on call today that during her office visit yesterday that she fell when she stood up to come to the exam room.  R Foot was asleep, and when she stood up she felt her right ankle turn in.  She was able to initially put weight on it in office, some initial pain, but walking on heel. More pain when got home, more swollen overnight. Less sore today, elevating at work. Has been elevating with ice and heat. No color changes, just swollen and less painful today. Able to weight bear on heel only. Unfortunately I was unaware of her injury yesterday, and this was not mentioned during her visit.  I have offered for her to be seen and worked into my schedule tomorrow for evaluation and then likely imaging if she is still having difficulty with weightbearing.  She plans to be seen through Ortho at [facility] tomorrow and will have them evaluate and x-ray accordingly.  Asked that she let me know if anything changes and again I am happy to work her into my appointments tomorrow.  In regard to hyponatremia, BMP ordered  For future order, lab visit next week to check status of hyponatremia, most recent levels noted from ER  visit on October 27.   In regards to the hand discoloration, I did review her most recent CT angio neck without significant new stenosis in December 2024, repeat imaging planned at 1 year. If hand discoloration returns, may need to have that imaging sooner to rule out stenosis as contributor.  We discussed this plan.  She will let me know if any persistent discoloration,  new symptoms or other concerns.

## 2024-09-05 ENCOUNTER — Encounter: Payer: Self-pay | Admitting: Family Medicine

## 2024-09-05 NOTE — Telephone Encounter (Signed)
 Pt sent you an update on her foot, this was evaluated today

## 2024-09-17 ENCOUNTER — Other Ambulatory Visit (INDEPENDENT_AMBULATORY_CARE_PROVIDER_SITE_OTHER)

## 2024-09-17 DIAGNOSIS — E871 Hypo-osmolality and hyponatremia: Secondary | ICD-10-CM | POA: Diagnosis not present

## 2024-09-17 LAB — BASIC METABOLIC PANEL WITH GFR
BUN: 10 mg/dL (ref 6–23)
CO2: 28 meq/L (ref 19–32)
Calcium: 9.6 mg/dL (ref 8.4–10.5)
Chloride: 98 meq/L (ref 96–112)
Creatinine, Ser: 0.72 mg/dL (ref 0.40–1.20)
GFR: 86.07 mL/min (ref >60.00)
Glucose, Bld: 57 mg/dL — ABNORMAL LOW (ref 70–99)
Potassium: 4.5 meq/L (ref 3.5–5.1)
Sodium: 133 meq/L — ABNORMAL LOW (ref 135–145)

## 2024-09-21 ENCOUNTER — Ambulatory Visit: Payer: Self-pay | Admitting: Family Medicine

## 2024-09-22 ENCOUNTER — Encounter (HOSPITAL_COMMUNITY): Payer: Self-pay | Admitting: Gastroenterology

## 2024-09-22 NOTE — Progress Notes (Signed)
 Attempted to obtain medical history for pre op call via telephone, unable to reach at this time. HIPAA compliant voicemail message left requesting return call to pre surgical testing department.

## 2024-09-23 ENCOUNTER — Telehealth: Payer: Self-pay

## 2024-09-23 NOTE — Telephone Encounter (Signed)
 Procedure:ultrasound upper GI Procedure date: 09/29/24 Procedure location: [facility] Arrival Time: 7:43 Spoke with the patient Y/N: Y Any prep concerns? N  Has the patient obtained the prep from the pharmacy ? N Do you have a care partner and transportation: Y Any additional concerns? N

## 2024-09-29 ENCOUNTER — Ambulatory Visit (HOSPITAL_COMMUNITY): Admitting: Anesthesiology

## 2024-09-29 ENCOUNTER — Encounter (HOSPITAL_COMMUNITY): Admission: RE | Disposition: A | Payer: Self-pay | Source: Home / Self Care | Attending: Gastroenterology

## 2024-09-29 ENCOUNTER — Encounter (HOSPITAL_COMMUNITY): Payer: Self-pay | Admitting: Gastroenterology

## 2024-09-29 ENCOUNTER — Other Ambulatory Visit: Payer: Self-pay

## 2024-09-29 ENCOUNTER — Ambulatory Visit (HOSPITAL_COMMUNITY)
Admission: RE | Admit: 2024-09-29 | Discharge: 2024-09-29 | Disposition: A | Attending: Gastroenterology | Admitting: Gastroenterology

## 2024-09-29 DIAGNOSIS — R112 Nausea with vomiting, unspecified: Secondary | ICD-10-CM | POA: Diagnosis present

## 2024-09-29 DIAGNOSIS — K838 Other specified diseases of biliary tract: Secondary | ICD-10-CM | POA: Diagnosis not present

## 2024-09-29 DIAGNOSIS — I899 Noninfective disorder of lymphatic vessels and lymph nodes, unspecified: Secondary | ICD-10-CM | POA: Diagnosis not present

## 2024-09-29 DIAGNOSIS — D759 Disease of blood and blood-forming organs, unspecified: Secondary | ICD-10-CM | POA: Insufficient documentation

## 2024-09-29 DIAGNOSIS — J439 Emphysema, unspecified: Secondary | ICD-10-CM | POA: Insufficient documentation

## 2024-09-29 DIAGNOSIS — K869 Disease of pancreas, unspecified: Secondary | ICD-10-CM | POA: Diagnosis not present

## 2024-09-29 DIAGNOSIS — J449 Chronic obstructive pulmonary disease, unspecified: Secondary | ICD-10-CM | POA: Diagnosis not present

## 2024-09-29 DIAGNOSIS — B9681 Helicobacter pylori [H. pylori] as the cause of diseases classified elsewhere: Secondary | ICD-10-CM | POA: Diagnosis not present

## 2024-09-29 DIAGNOSIS — K2950 Unspecified chronic gastritis without bleeding: Secondary | ICD-10-CM

## 2024-09-29 DIAGNOSIS — E039 Hypothyroidism, unspecified: Secondary | ICD-10-CM | POA: Insufficient documentation

## 2024-09-29 DIAGNOSIS — Z7902 Long term (current) use of antithrombotics/antiplatelets: Secondary | ICD-10-CM | POA: Insufficient documentation

## 2024-09-29 DIAGNOSIS — R519 Headache, unspecified: Secondary | ICD-10-CM | POA: Diagnosis not present

## 2024-09-29 DIAGNOSIS — K319 Disease of stomach and duodenum, unspecified: Secondary | ICD-10-CM | POA: Diagnosis not present

## 2024-09-29 DIAGNOSIS — K2289 Other specified disease of esophagus: Secondary | ICD-10-CM

## 2024-09-29 DIAGNOSIS — K8689 Other specified diseases of pancreas: Secondary | ICD-10-CM

## 2024-09-29 DIAGNOSIS — K449 Diaphragmatic hernia without obstruction or gangrene: Secondary | ICD-10-CM | POA: Diagnosis not present

## 2024-09-29 DIAGNOSIS — I739 Peripheral vascular disease, unspecified: Secondary | ICD-10-CM | POA: Diagnosis not present

## 2024-09-29 DIAGNOSIS — Z87891 Personal history of nicotine dependence: Secondary | ICD-10-CM | POA: Diagnosis not present

## 2024-09-29 DIAGNOSIS — K219 Gastro-esophageal reflux disease without esophagitis: Secondary | ICD-10-CM | POA: Insufficient documentation

## 2024-09-29 DIAGNOSIS — K2970 Gastritis, unspecified, without bleeding: Secondary | ICD-10-CM

## 2024-09-29 DIAGNOSIS — R1011 Right upper quadrant pain: Secondary | ICD-10-CM

## 2024-09-29 HISTORY — PX: ESOPHAGOGASTRODUODENOSCOPY: SHX5428

## 2024-09-29 HISTORY — PX: BIOPSY OF SKIN SUBCUTANEOUS TISSUE AND/OR MUCOUS MEMBRANE: SHX6741

## 2024-09-29 HISTORY — PX: EUS: SHX5427

## 2024-09-29 LAB — HEPATIC FUNCTION PANEL
ALT: 154 U/L — ABNORMAL HIGH (ref 0–44)
AST: 112 U/L — ABNORMAL HIGH (ref 15–41)
Albumin: 4.3 g/dL (ref 3.5–5.0)
Alkaline Phosphatase: 105 U/L (ref 38–126)
Bilirubin, Direct: 0.1 mg/dL (ref 0.0–0.2)
Indirect Bilirubin: 0.1 mg/dL — ABNORMAL LOW (ref 0.3–0.9)
Total Bilirubin: 0.3 mg/dL (ref 0.0–1.2)
Total Protein: 6.9 g/dL (ref 6.5–8.1)

## 2024-09-29 SURGERY — EGD (ESOPHAGOGASTRODUODENOSCOPY)
Anesthesia: Monitor Anesthesia Care

## 2024-09-29 MED ORDER — PANTOPRAZOLE SODIUM 40 MG PO TBEC: 40.0000 mg | DELAYED_RELEASE_TABLET | Freq: Two times a day (BID) | ORAL | 6 refills | Status: AC

## 2024-09-29 MED ORDER — PROPOFOL 500 MG/50ML IV EMUL
INTRAVENOUS | Status: DC | PRN
  Administered 2024-09-29: 09:00:00 80 mg via INTRAVENOUS
  Administered 2024-09-29: 09:00:00 175 ug/kg/min via INTRAVENOUS

## 2024-09-29 MED ORDER — SODIUM CHLORIDE 0.9 % IV SOLN: INTRAVENOUS | Status: DC

## 2024-09-29 NOTE — Anesthesia Postprocedure Evaluation (Signed)
 Anesthesia Post Note  Patient: [name]  Procedure(s) Performed: ULTRASOUND, UPPER GI TRACT, ENDOSCOPIC EGD (ESOPHAGOGASTRODUODENOSCOPY) BIOPSY, SKIN, SUBCUTANEOUS TISSUE, OR MUCOUS MEMBRANE     Patient location during evaluation: Endoscopy Anesthesia Type: MAC Level of consciousness: oriented, awake and alert and awake Pain management: pain level controlled Vital Signs Assessment: post-procedure vital signs reviewed and stable Respiratory status: spontaneous breathing, nonlabored ventilation, respiratory function stable and patient connected to nasal cannula oxygen Cardiovascular status: blood pressure returned to baseline and stable Postop Assessment: no headache, no backache and no apparent nausea or vomiting Anesthetic complications: no   No notable events documented.  Last Vitals:  Vitals:   09/29/24 1005 09/29/24 1010  BP:  (!) 143/74  Pulse: 66 63  Resp: 13 12  Temp:    SpO2: 98% 97%    Last Pain:  Vitals:   09/29/24 1010  TempSrc:   PainSc: 0-No pain                 Garnette FORBES Skillern

## 2024-09-29 NOTE — Op Note (Addendum)
 [facility] Patient Name: Bethany Clarke Procedure Date: 09/29/2024 MRN: [id number] Attending MD: Aloha Finner , MD, [id number] Date of Birth: 10/24/1955 CSN: [id number] Age: 68 Admit Type: Outpatient Procedure:                Upper EUS Indications:              Common bile duct dilation (acquired) seen on MRCP,                            Abdominal pain, Nausea with vomiting Providers:                Aloha Finner, MD, Randall Lines, RN, Lorrayne Kitty, Technician Referring MD:              Medicines:                Monitored Anesthesia Care Complications:            No immediate complications. Estimated Blood Loss:     Estimated blood loss was minimal. Procedure:                Pre-Anesthesia Assessment:                           - Prior to the procedure, a History and Physical                            was performed, and patient medications and                            allergies were reviewed. The patient's tolerance of                            previous anesthesia was also reviewed. The risks                            and benefits of the procedure and the sedation                            options and risks were discussed with the patient.                            All questions were answered, and informed consent                            was obtained. Prior Anticoagulants: The patient has                            taken no anticoagulant or antiplatelet agents                            except for aspirin . ASA Grade Assessment: III - A  patient with severe systemic disease. After                            reviewing the risks and benefits, the patient was                            deemed in satisfactory condition to undergo the                            procedure.                           After obtaining informed consent, the endoscope was                            passed under direct  vision. Throughout the                            procedure, the patient's blood pressure, pulse, and                            oxygen saturations were monitored continuously. The                            [id number]-[id number] ([id number]) Olympus endoscope was introduced                            through the mouth, and advanced to the second part                            of duodenum. The [id number]-[id number] ([id number]) Olympus                            duodenoscope was introduced through the mouth, and                            advanced to the area of papilla. The [id number]                            ([id number]) Olympus endosonoscope was introduced                            through the mouth, and advanced to the duodenum for                            ultrasound examination from the esophagus, stomach                            and duodenum. The upper EUS was accomplished                            without difficulty. The patient tolerated the  procedure. Scope In: Scope Out: Findings:      ENDOSCOPIC FINDING: :      No gross lesions were noted in the entire esophagus.      The Z-line was irregular and was found 39 cm from the incisors.      A 1 cm hiatal hernia was present.      Segmental moderate inflammation characterized by erythema, friability       and granularity was found in the entire examined stomach. Biopsies were       taken with a cold forceps for histology and Helicobacter pylori testing.      No gross lesions were noted in the duodenal bulb, in the first portion       of the duodenum and in the second portion of the duodenum. Biopsies were       taken with a cold forceps for histology.      The major papilla was normal (hidden under a hood).      ENDOSONOGRAPHIC FINDING: :      Pancreatic parenchymal abnormalities were noted in the entire pancreas.       These consisted of hyperechoic strands.      The pancreatic duct had a dilated endosonographic appearance in the        pancreatic head (5.3 -> 4.0 mm), genu of the pancreas (3.0 mm), body of       the pancreas (2.8 mm) and tail of the pancreas (1.5 mm).      Endosonographic imaging in the entire pancreas showed no mass lesion or       cysts.      There was dilation in the common bile duct (4.8 -> 11.0 mm) and in the       common hepatic duct (14.4 mm). No evidence of choledocholithiasis or       biliary stricturing.      Endosonographic imaging in the gallbladder showed no stones, sludge or       polyp.      Endosonographic imaging of the ampulla showed no intramural       (subepithelial) lesion or wall thickening.      Endosonographic imaging in the visualized portion of the liver showed no       mass.      No malignant-appearing lymph nodes were visualized in the celiac region       (level 20), peripancreatic region and porta hepatis region.      Endosonographic imaging in the gastroesophageal junction showed no wall       thickening.      The celiac region was visualized.      The esophagus, stomach and duodenum were examined endosonographically. Impression:               EGD impression:                           - No gross lesions in the entire esophagus. Z-line                            irregular, 39 cm from the incisors.                           - 1 cm hiatal hernia.                           -  Gastritis. Biopsied.                           - No gross lesions in the duodenal bulb, in the                            first portion of the duodenum and in the second                            portion of the duodenum. Biopsied.                           - Normal major papilla (hidden under a hood).                           EUS impression:                           - Pancreatic parenchymal abnormalities consisting                            of hyperechoic strands were noted in the entire                            pancreas.                           - The pancreatic duct had a dilated  endosonographic                            appearance in the pancreatic head, genu of the                            pancreas, body of the pancreas and tail of the                            pancreas. There was smooth tapering throughout.                           - No evidence of chronic pancreatitis or pancreatic                            mass/lesion/stricturing.                           - There was dilation in the common bile duct and in                            the common hepatic duct. Compared to 2013 MRI/MRCP,                            this has progressed slightly. No evidence of any                            choledocholithiasis or biliary  stricturing.                           - No ampullary intramural process noted.                           - No malignant-appearing lymph nodes were                            visualized in the celiac region (level 20),                            peripancreatic region and porta hepatis region. Moderate Sedation:      Not Applicable - Patient had care per Anesthesia. Recommendation:           - The patient will be observed post-procedure,                            until all discharge criteria are met.                           - Discharge patient to home.                           - Patient has a contact number available for                            emergencies. The signs and symptoms of potential                            delayed complications were discussed with the                            patient. Return to normal activities tomorrow.                            Written discharge instructions were provided to the                            patient.                           - Resume previous diet.                           - Observe patient's clinical course.                           - Await path results.                           - HFP to be sent today, will forward to Dr. Abran                            when it has returned.                            -  PPI 40 mg twice daily for 1 month then once daily                            thereafter.                           - May restart Plavix  on 12/16.                           - Query based on the patient's longstanding nature                            of dilated bile duct (dilated imaging in 2013),                            whether patient could be dealing with a choledochal                            cyst type I. Recommend consideration of discussion                            at [facility], if primary GI agrees.                           - Consider repeat imaging with MRI/MRCP in 1-year                            otherwise.                           - The findings and recommendations were discussed                            with the patient.                           - The findings and recommendations were discussed                            with the patient's family. Procedure Code(s):        --- Professional ---                           [phone], Esophagogastroduodenoscopy, flexible,                            transoral; with endoscopic ultrasound examination,                            including the esophagus, stomach, and either the                            duodenum or a surgically altered stomach where the  jejunum is examined distal to the anastomosis                           43239, Esophagogastroduodenoscopy, flexible,                            transoral; with biopsy, single or multiple Diagnosis Code(s):        --- Professional ---                           K22.89, Other specified disease of esophagus                           K44.9, Diaphragmatic hernia without obstruction or                            gangrene                           K29.70, Gastritis, unspecified, without bleeding                           K86.9, Disease of pancreas, unspecified                           K83.8, Other specified diseases of biliary tract                            I89.9, Noninfective disorder of lymphatic vessels                            and lymph nodes, unspecified                           R10.9, Unspecified abdominal pain                           R11.2, Nausea with vomiting, unspecified                           R93.3, Abnormal findings on diagnostic imaging of                            other parts of digestive tract CPT copyright 2022 [employer]. All rights reserved. The codes documented in this report are preliminary and upon coder review may  be revised to meet current compliance requirements. Aloha Finner, MD 09/29/2024 9:52:37 AM Number of Addenda: 0

## 2024-09-29 NOTE — Discharge Instructions (Signed)

## 2024-09-29 NOTE — Transfer of Care (Signed)
 Immediate Anesthesia Transfer of Care Note  Patient: Bethany Clarke  Procedure(s) Performed: ULTRASOUND, UPPER GI TRACT, ENDOSCOPIC EGD (ESOPHAGOGASTRODUODENOSCOPY) BIOPSY, SKIN, SUBCUTANEOUS TISSUE, OR MUCOUS MEMBRANE  Patient Location: PACU  Anesthesia Type:MAC  Level of Consciousness: drowsy  Airway & Oxygen Therapy: Patient Spontanous Breathing  Post-op Assessment: Report given to RN and Post -op Vital signs reviewed and stable  Post vital signs: Reviewed and stable  Last Vitals:  Vitals Value Taken Time  BP 127/71 09/29/24 09:40  Temp 97.1   Pulse 64 09/29/24 09:43  Resp 15 09/29/24 09:43  SpO2 100 % 09/29/24 09:43  Vitals shown include unfiled device data.  Last Pain:  Vitals:   09/29/24 0739  TempSrc: Temporal  PainSc: 0-No pain         Complications: No notable events documented.

## 2024-09-29 NOTE — H&P (Signed)
 GASTROENTEROLOGY PROCEDURE H&P NOTE   Primary Care Physician: Levora Reyes SAUNDERS, MD  HPI: Bethany Clarke is a 68 y.o. female who presents for EGD/EUS to evaluate N/V and dilated bile duct (GB still in place).   Past Medical History:  Diagnosis Date   Atherosclerosis 11/14/2005   carotid US  - mild calcific non-stenotic plaque bilaterally   Carotid bruit 02/05/2009   Doppler - R/L ICAs 0-49% diameter reductin (velocities suggest low-mid range); L subclavian 0-49%  diameter reduction   Chronic bronchitis (HCC)    growing up; not anymore (03/14/2013)   Colon polyp    hyperplastic   Costochondritis    Dizziness    Emphysema of lung (HCC)    GERD (gastroesophageal reflux disease)    H. pylori infection    Heart burn    Hemorrhoids    Hypothyroid    Lipidemia    Pneumonia ~ 2009   Subclavian artery stenosis, left    subclavian steal physiology status post PTA and stenting   Syncope, near    Past Surgical History:  Procedure Laterality Date   ABDOMINAL HYSTERECTOMY  1999   BILATERAL UPPER EXTREMITY ANGIOGRAM N/A 03/14/2013   Procedure: BILATERAL UPPER EXTREMITY ANGIOGRAM;  Surgeon: Dorn JINNY Lesches, MD;  Location: [facility] CATH LAB;  Service: Cardiovascular;  Laterality: N/A;   BREAST BIOPSY Right 1990's   benign (03/14/2013)   CARDIAC CATHETERIZATION  03/03/2013   DILATION AND CURETTAGE OF UTERUS  1980's   KNEE ARTHROSCOPY WITH MENISCAL REPAIR Left 07/21/2021   Procedure: Arthroscopic debridement, arthroscopic debridement of bucket-handle component of lateral meniscus tear, and arthroscopically-assisted repair of lateral meniscus root tear, left knee. ;  Surgeon: Edie Norleen JINNY, MD;  Location: [facility] ORS;  Service: Orthopedics;  Laterality: Left;   LEFT HEART CATHETERIZATION WITH CORONARY ANGIOGRAM Bilateral 03/03/2013   Procedure: LEFT HEART CATHETERIZATION WITH CORONARY ANGIOGRAM;  Surgeon: Debby DELENA Sor, MD;  Location: [facility] CATH LAB;  Service: Cardiovascular;  Laterality: Bilateral;    PERCUTANEOUS STENT INTERVENTION  03/14/2013   Procedure: PERCUTANEOUS STENT INTERVENTION;  Surgeon: Dorn JINNY Lesches, MD;  Location: [facility] CATH LAB;  Service: Cardiovascular;;   SUBCLAVIAN ARTERY STENT Left 03/14/2013    for subclavian steal/claudication (03/14/2013)   TUBAL LIGATION  1980's   Current Facility-Administered Medications  Medication Dose Route Frequency Provider Last Rate Last Admin   0.9 %  sodium chloride  infusion   Intravenous Continuous Mansouraty, Aloha Raddle., MD 20 mL/hr at 09/29/24 0745 New Bag at 09/29/24 0745   Current Medications[1] Allergies[2] Family History  Problem Relation Age of Onset   Diabetes Mother    Heart disease Mother    Stroke Mother    Liver disease Father    Liver cancer Father    Cancer Father        cirrohosis   Hypertension Father    Heart disease Sister    Diabetes Sister    Hyperlipidemia Sister    Hypertension Sister    COPD Sister    Arthritis Sister        rheumatoid   Diabetes Sister    Arthritis Sister        rheumatoid   Cancer Brother        spine/lung   COPD Brother        smoked   Hypertension Brother    Rheum arthritis Brother    Hyperlipidemia Brother    Hypertension Brother    Colon cancer Neg Hx    Esophageal cancer Neg Hx    Inflammatory  bowel disease Neg Hx    Pancreatic cancer Neg Hx    Rectal cancer Neg Hx    Stomach cancer Neg Hx    Social History   Socioeconomic History   Marital status: Married    Spouse name: Bethany Clarke   Number of children: 3   Years of education: Not on file   Highest education level: Some college, no degree  Occupational History   Occupation: Orthoptist: [employer]  Tobacco Use   Smoking status: Former    Current packs/day: 0.00    Average packs/day: 0.5 packs/day for 40.0 years (20.0 ttl pk-yrs)    Types: Cigarettes    Start date: 02/11/1973    Quit date: 02/11/2013    Years since quitting: 11.6   Smokeless tobacco: Never  Vaping Use   Vaping  status: Never Used  Substance and Sexual Activity   Alcohol use: No   Drug use: No   Sexual activity: Yes    Comment: number of sex partners in the last 12 months  1  Other Topics Concern   Not on file  Social History Narrative   Daily caffeine. Married. Education: [employer]. Exercise: No.      Patient is right-handed. She lives with her husband in a two level home. She drinks 2 cups of coffee a day. 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   Social Drivers of Health   Tobacco Use: Medium Risk (09/29/2024)   Patient History    Smoking Tobacco Use: Former    Smokeless Tobacco Use: Never    Passive Exposure: Not on file  Financial Resource Strain: Low Risk (08/14/2023)   Overall Financial Resource Strain (CARDIA)    Difficulty of Paying Living Expenses: Not hard at all  Food Insecurity: No Food Insecurity (08/14/2023)   Hunger Vital Sign    Worried About Running Out of Food in the Last Year: Never true    Ran Out of Food in the Last Year: Never true  Transportation Needs: No Transportation Needs (08/14/2023)   PRAPARE - Administrator, Civil Service (Medical): No    Lack of Transportation (Non-Medical): No  Physical Activity: Sufficiently Active (08/14/2023)   Exercise Vital Sign    Days of Exercise per Week: 5 days    Minutes of Exercise per Session: 60 min  Stress: No Stress Concern Present (08/14/2023)   [employer] of Occupational Health - Occupational Stress Questionnaire    Feeling of Stress : Not at all  Social Connections: Moderately Integrated (08/14/2023)   Social Connection and Isolation Panel    Frequency of Communication with Friends and Family: Not on file    Frequency of Social Gatherings with Friends and Family: More than three times a week    Attends Religious Services: Never    Database Administrator or Organizations: Yes    Attends Engineer, Structural: More than 4 times per year    Marital Status: Married  Careers Information Officer Violence: Not At Risk (08/14/2023)   Humiliation, Afraid, Rape, and Kick questionnaire    Fear of Current or Ex-Partner: No    Emotionally Abused: No    Physically Abused: No    Sexually Abused: No  Depression (PHQ2-9): High Risk (04/10/2024)   Depression (PHQ2-9)    PHQ-2 Score: 11  Alcohol Screen: Low Risk (08/14/2023)   Alcohol Screen    Last Alcohol Screening Score (AUDIT): 0  Housing: Low Risk (08/14/2023)  Housing    Last Housing Risk Score: 0  Utilities: Not At Risk (08/14/2023)   AHC Utilities    Threatened with loss of utilities: No  Health Literacy: Adequate Health Literacy (08/14/2023)   B1300 Health Literacy    Frequency of need for help with medical instructions: Never    Physical Exam: Today's Vitals   09/22/24 1140 09/29/24 0739  BP:  (!) 142/85  Pulse:  68  Resp:  (!) 22  Temp:  98.6 F (37 C)  TempSrc:  Temporal  SpO2:  97%  Weight: 54 kg 54.4 kg  Height:  5' 4 (1.626 m)  PainSc:  0-No pain   Body mass index is 20.6 kg/m. GEN: NAD EYE: Sclerae anicteric ENT: MMM CV: Non-tachycardic GI: Soft, NT/ND NEURO:  Alert & Oriented x 3  Lab Results: No results for input(s): WBC, HGB, HCT, PLT in the last 72 hours. BMET No results for input(s): NA, K, CL, CO2, GLUCOSE, BUN, CREATININE, CALCIUM  in the last 72 hours. LFT No results for input(s): PROT, ALBUMIN, AST, ALT, ALKPHOS, BILITOT, BILIDIR, IBILI in the last 72 hours. PT/INR No results for input(s): LABPROT, INR in the last 72 hours.   Impression / Plan: This is a 68 y.o.female who presents for EGD/EUS to evaluate N/V and dilated bile duct (GB still in place).    The risks of an EUS including intestinal perforation, bleeding, infection, aspiration, and medication effects were discussed as was the possibility it may not give a definitive diagnosis if a biopsy is performed.  When a biopsy of the pancreas is done as part of the EUS, there is an  additional risk of pancreatitis at the rate of about 1-2%.  It was explained that procedure related pancreatitis is typically mild, although it can be severe and even life threatening, which is why we do not perform random pancreatic biopsies and only biopsy a lesion/area we feel is concerning enough to warrant the risk.   The risks and benefits of endoscopic evaluation/treatment were discussed with the patient and/or family; these include but are not limited to the risk of perforation, infection, bleeding, missed lesions, lack of diagnosis, severe illness requiring hospitalization, as well as anesthesia and sedation related illnesses.  The patient's history has been reviewed, patient examined, no change in status, and deemed stable for procedure.  The patient and/or family was provided an opportunity to ask questions and all were answered.  The patient and/or family is agreeable to proceed.    Aloha Finner, MD [facility] Office # [phone]     [1]  Current Facility-Administered Medications:    0.9 %  sodium chloride  infusion, , Intravenous, Continuous, Mansouraty, Aloha Raddle., MD, Last Rate: 20 mL/hr at 09/29/24 0745, New Bag at 09/29/24 0745 [2] No Known Allergies

## 2024-09-29 NOTE — Anesthesia Preprocedure Evaluation (Addendum)
 Anesthesia Evaluation  Patient identified by MRN, date of birth, ID band Patient awake    Reviewed: Allergy & Precautions, NPO status , Patient's Chart, lab work & pertinent test results  Airway Mallampati: I  TM Distance: >3 FB Neck ROM: Full    Dental  (+) Dental Advisory Given, Lower Dentures, Upper Dentures   Pulmonary COPD, former smoker   Pulmonary exam normal breath sounds clear to auscultation       Cardiovascular + Peripheral Vascular Disease (subclavian steal physiology status post PTA and stenting)  Normal cardiovascular exam Rhythm:Regular Rate:Normal     Neuro/Psych  Headaches  Neuromuscular disease  negative psych ROS   GI/Hepatic ,GERD  ,,Dilated bile duct, RUQ abd pain   Endo/Other  Hypothyroidism    Renal/GU negative Renal ROS     Musculoskeletal negative musculoskeletal ROS (+)    Abdominal   Peds  Hematology  (+) Blood dyscrasia (Plavix )   Anesthesia Other Findings Day of surgery medications reviewed with the patient.  Reproductive/Obstetrics                              Anesthesia Physical Anesthesia Plan  ASA: 2  Anesthesia Plan: MAC   Post-op Pain Management: Minimal or no pain anticipated   Induction: Intravenous  PONV Risk Score and Plan: 2 and TIVA  Airway Management Planned: Natural Airway and Simple Face Mask  Additional Equipment:   Intra-op Plan:   Post-operative Plan:   Informed Consent: I have reviewed the patients History and Physical, chart, labs and discussed the procedure including the risks, benefits and alternatives for the proposed anesthesia with the patient or authorized representative who has indicated his/her understanding and acceptance.     Dental advisory given  Plan Discussed with: CRNA  Anesthesia Plan Comments:          Anesthesia Quick Evaluation

## 2024-09-30 ENCOUNTER — Ambulatory Visit: Payer: Self-pay | Admitting: Gastroenterology

## 2024-09-30 ENCOUNTER — Other Ambulatory Visit: Payer: Self-pay

## 2024-09-30 LAB — SURGICAL PATHOLOGY

## 2024-09-30 MED ORDER — METRONIDAZOLE 250 MG PO TABS: 500.0000 mg | ORAL_TABLET | Freq: Four times a day (QID) | ORAL | 0 refills | Status: AC

## 2024-09-30 MED ORDER — OMEPRAZOLE 20 MG PO CPDR: 20.0000 mg | DELAYED_RELEASE_CAPSULE | Freq: Two times a day (BID) | ORAL | 0 refills | Status: AC

## 2024-09-30 MED ORDER — TETRACYCLINE HCL 500 MG PO CAPS: 500.0000 mg | ORAL_CAPSULE | Freq: Four times a day (QID) | ORAL | 0 refills | Status: AC

## 2024-09-30 MED ORDER — BISMUTH SUBSALICYLATE 262 MG PO TABS: 2.0000 | ORAL_TABLET | Freq: Four times a day (QID) | ORAL | 0 refills | Status: AC

## 2024-10-01 ENCOUNTER — Encounter: Payer: Self-pay | Admitting: Family Medicine

## 2024-10-01 ENCOUNTER — Ambulatory Visit: Admitting: Family Medicine

## 2024-10-01 VITALS — BP 100/60 | HR 67 | Temp 98.6°F | Resp 12 | Ht 64.0 in | Wt 121.8 lb

## 2024-10-01 DIAGNOSIS — E039 Hypothyroidism, unspecified: Secondary | ICD-10-CM

## 2024-10-01 DIAGNOSIS — R230 Cyanosis: Secondary | ICD-10-CM | POA: Diagnosis not present

## 2024-10-01 DIAGNOSIS — B9681 Helicobacter pylori [H. pylori] as the cause of diseases classified elsewhere: Secondary | ICD-10-CM

## 2024-10-01 DIAGNOSIS — E871 Hypo-osmolality and hyponatremia: Secondary | ICD-10-CM | POA: Diagnosis not present

## 2024-10-01 DIAGNOSIS — K2970 Gastritis, unspecified, without bleeding: Secondary | ICD-10-CM

## 2024-10-01 DIAGNOSIS — K5909 Other constipation: Secondary | ICD-10-CM

## 2024-10-01 DIAGNOSIS — G458 Other transient cerebral ischemic attacks and related syndromes: Secondary | ICD-10-CM

## 2024-10-01 DIAGNOSIS — R110 Nausea: Secondary | ICD-10-CM

## 2024-10-01 DIAGNOSIS — Z23 Encounter for immunization: Secondary | ICD-10-CM

## 2024-10-01 DIAGNOSIS — F418 Other specified anxiety disorders: Secondary | ICD-10-CM

## 2024-10-01 NOTE — Progress Notes (Signed)
 Subjective:  Patient ID: Bethany Clarke, female    DOB: 10-01-1956  Age: 68 y.o. MRN: [id number]  CC:  Chief Complaint  Patient presents with   Hypothyroidism    3 month follow up. Patient was dx with H. Poylori    HPI Bethany Clarke presents for   Nausea, constipation, intermittent abdominal discomfort. Intermittent constipation with treatment discussed at her November 19's visit.  She did have a previous dilated common bile duct on imaging and underwent EGD/EUS 2 days ago.  Appreciate assistance from Dr. Wilhelmenia.   Operative report reviewed from December 15.  Segmental moderate inflammation characterized by erythema, friability and granularity found in the entire examined stomach.  Biopsies obtained.  No gross lesions within the duodenal bulb duodenum.  Hyperechoic strands within the pancreas, pancreatic duct with dilated endoscopic appearance of the pancreatic head body and tail of the pancreas.  No evidence of chronic pancreatitis or pancreatic mass lesion or stricturing.  Dilation of the common bile duct and common hepatic duct, compared to 2013 MRI/MRCP had progressed slightly but no evidence choledocholithiasis or biliary stricturing.  No ampullary intraneural process noted.  There were no malignant appearing lymph nodes noted.  No wall thickening of the gastroesophageal junction.  1 cm hiatal hernia noted.  Gastritis was biopsied. Biopsy indicated evidence of H. pylori infection, prescription sent for treatment.  Plan for Diatherix testing.  Plan for MRI/MRCP 1 year recall for common bile duct and pancreatic duct dilatation follow-up.  83-month follow-up with GI.  Constipation was discussed at her last visit including use of MiraLAX as needed.  Previous hypothyroidism may have been contributor but now controlled with increased dose of meds. Still taking miralax 3 times per day since Saturday of last week. Last BM 3 days ago. No suppositories. Did have BM every other day after  last visit, with miralax 2 times per day. Drinking fluids.   H pylori treatment was ordered - will start tonight. Will need to discuss med concern re: this regimen and plavix .    Discoloration of skin of finger Discussed at her November 19 visit.  Question of possible Raynaud's but asymptomatic at that time.  Unlikely vascular cause. CT angio neck planned January 8, stable L ICA stent and stable 60% LICA stenosis on 10/08/2023 CT angiogram. Still occurring multiple times per day. Pain with numbness and turns purple. No photo. Not occurring currently. No inciting factors. Improves in few hours with position changes, warming.   Right foot injury Noted on follow-up phone call after last visit, noted that she had twisted her ankle/foot when stepping up in the office but I was unaware of that injury at that time.  I offered for her to be seen again in office for imaging but was seen by Ortho, MyChart message indicating no broken bones on x-ray, plan for boot for 2 weeks to allow swelling to improve. Wore boot for a few weeks - no brace needed now. Walking normally.   Depression: With history of anxiety.  Treated with Prozac  40 mg daily. See phone call after last visit. Tragically son had passed in 2024-08-13.  Understandably difficult initially but had been meeting with a therapist, was sleeping better and had remained on Prozac .  She denied need for additional resources on that November 20th call. Had taken a few extra doses of prozac  in Aug 13, 2024 and November. Recently 1 per day. Feels like she would like to stay on same dose for now.   Responses below are related  to not feeling well with GI illness. Anticipates improvement with new treatment.      10/01/2024    2:32 PM 04/10/2024   11:32 AM 08/14/2023    8:08 AM 05/18/2023    8:08 AM 11/15/2022    8:27 AM  Depression screen PHQ 2/9  Decreased Interest 3 2 2 1 2   Down, Depressed, Hopeless 2 1 2 1 1   PHQ - 2 Score 5 3 4 2 3   Altered sleeping 3 3 2 2  1   Tired, decreased energy 3 3 3 2 1   Change in appetite 3 0 3 2 2   Feeling bad or failure about yourself  0 0 1 2 1   Trouble concentrating 3 1 0 2 1  Moving slowly or fidgety/restless 1 1 0 1 1  Suicidal thoughts 0 0 0 0 0  PHQ-9 Score 18 11  13  13  10    Difficult doing work/chores Somewhat difficult Not difficult at all Somewhat difficult Not difficult at all      Data saved with a previous flowsheet row definition   Hyponatremia See prior labs.  Had stabilized, and cut back on some of the additional free water. Has been drinking electrolytes at times, feels like this has prevented dehydration.  Most recent testing stable on December 3 at 133, previously 130 on October 27. Lab Results  Component Value Date   NA 133 (L) 09/17/2024   K 4.5 09/17/2024   CL 98 09/17/2024   CO2 28 09/17/2024   Hypothyroidism: Lab Results  Component Value Date   TSH 1.73 08/18/2024  See prior notes.  Uncontrolled hypothyroidism with adjustment of med regimen, most recent testing indicated normal TSH at 1.73, currently taking Synthroid  100 mcg daily.          History Patient Active Problem List   Diagnosis Date Noted   Dilated pancreatic duct 09/29/2024   Gastritis without bleeding 09/29/2024   Other constipation 08/01/2024   Chronic nausea 08/01/2024   RUQ abdominal pain 08/01/2024   Dilated bile duct 08/01/2024   Neck pain on right side 10/04/2022   Cervico-occipital neuralgia of right side 10/04/2022   Pain in joints of left hand 07/17/2019   Chest pain, atypical 03/17/2016   Paresthesia 07/14/2013   Bilateral foot pain 05/01/2013   Subclavian artery stenosis, left    Subclavian steal syndrome 03/16/2013   Subclavian arterial stenosis (HCC) 03/11/2013   Carotid stenosis 03/11/2013   Hyperlipidemia with target LDL less than 70 03/11/2013   GERD (gastroesophageal reflux disease)    Hypothyroid    Dizziness    Syncope, near    Heart burn    Hypothyroidism 05/28/2007   COPD  GOLD II with reversibility  05/28/2007   Past Medical History:  Diagnosis Date   Atherosclerosis 11/14/2005   carotid US  - mild calcific non-stenotic plaque bilaterally   Carotid bruit 02/05/2009   Doppler - R/L ICAs 0-49% diameter reductin (velocities suggest low-mid range); L subclavian 0-49%  diameter reduction   Chronic bronchitis (HCC)    growing up; not anymore (03/14/2013)   Colon polyp    hyperplastic   Costochondritis    Dizziness    Emphysema of lung (HCC)    GERD (gastroesophageal reflux disease)    H. pylori infection    Heart burn    Hemorrhoids    Hypothyroid    Lipidemia    Pneumonia ~ 2009   Subclavian artery stenosis, left    subclavian steal physiology status post PTA and stenting  Syncope, near    Past Surgical History:  Procedure Laterality Date   ABDOMINAL HYSTERECTOMY  1999   BILATERAL UPPER EXTREMITY ANGIOGRAM N/A 03/14/2013   Procedure: BILATERAL UPPER EXTREMITY ANGIOGRAM;  Surgeon: Dorn JINNY Lesches, MD;  Location: [facility] CATH LAB;  Service: Cardiovascular;  Laterality: N/A;   BIOPSY OF SKIN SUBCUTANEOUS TISSUE AND/OR MUCOUS MEMBRANE  09/29/2024   Procedure: BIOPSY, SKIN, SUBCUTANEOUS TISSUE, OR MUCOUS MEMBRANE;  Surgeon: Wilhelmenia Aloha Raddle., MD;  Location: [facility] ENDOSCOPY;  Service: Gastroenterology;;   BREAST BIOPSY Right 1990's   benign (03/14/2013)   CARDIAC CATHETERIZATION  03/03/2013   DILATION AND CURETTAGE OF UTERUS  1980's   ESOPHAGOGASTRODUODENOSCOPY N/A 09/29/2024   Procedure: EGD (ESOPHAGOGASTRODUODENOSCOPY);  Surgeon: Wilhelmenia Aloha Raddle., MD;  Location: [facility];  Service: Gastroenterology;  Laterality: N/A;   EUS N/A 09/29/2024   Procedure: ULTRASOUND, UPPER GI TRACT, ENDOSCOPIC;  Surgeon: Wilhelmenia Aloha Raddle., MD;  Location: [facility];  Service: Gastroenterology;  Laterality: N/A;   KNEE ARTHROSCOPY WITH MENISCAL REPAIR Left 07/21/2021   Procedure: Arthroscopic debridement, arthroscopic debridement of bucket-handle component of  lateral meniscus tear, and arthroscopically-assisted repair of lateral meniscus root tear, left knee. ;  Surgeon: Edie Norleen JINNY, MD;  Location: [facility] ORS;  Service: Orthopedics;  Laterality: Left;   LEFT HEART CATHETERIZATION WITH CORONARY ANGIOGRAM Bilateral 03/03/2013   Procedure: LEFT HEART CATHETERIZATION WITH CORONARY ANGIOGRAM;  Surgeon: Debby DELENA Sor, MD;  Location: [facility] CATH LAB;  Service: Cardiovascular;  Laterality: Bilateral;   PERCUTANEOUS STENT INTERVENTION  03/14/2013   Procedure: PERCUTANEOUS STENT INTERVENTION;  Surgeon: Dorn JINNY Lesches, MD;  Location: [facility] CATH LAB;  Service: Cardiovascular;;   SUBCLAVIAN ARTERY STENT Left 03/14/2013    for subclavian steal/claudication (03/14/2013)   TUBAL LIGATION  1980's   Allergies[1] Prior to Admission medications  Medication Sig Start Date End Date Taking? Authorizing Provider  aspirin  EC 81 MG tablet Take 81 mg by mouth daily. Swallow whole.   Yes [provider]  atorvastatin  (LIPITOR) 40 MG tablet TAKE 1 TABLET BY MOUTH EVERY DAY 03/03/24  Yes Levora Reyes SAUNDERS, MD  clopidogrel  (PLAVIX ) 75 MG tablet TAKE 1 TABLET BY MOUTH EVERY DAY 04/07/24  Yes Levora Reyes SAUNDERS, MD  FLUoxetine  (PROZAC ) 40 MG capsule TAKE 1 CAPSULE (40 MG TOTAL) BY MOUTH DAILY. 07/09/24  Yes Levora Reyes SAUNDERS, MD  levothyroxine  (SYNTHROID ) 100 MCG tablet Take 1 tablet (100 mcg total) by mouth daily. 04/10/24  Yes Levora Reyes SAUNDERS, MD  metroNIDAZOLE  (FLAGYL ) 250 MG tablet Take 2 tablets (500 mg total) by mouth 4 (four) times daily for 14 days. 09/30/24 10/14/24 Yes Mansouraty, Aloha Raddle., MD  omeprazole  (PRILOSEC) 20 MG capsule Take 1 capsule (20 mg total) by mouth 2 (two) times daily before a meal. 09/30/24 10/14/24 Yes Mansouraty, Aloha Raddle., MD  pantoprazole  (PROTONIX ) 40 MG tablet Take 1 tablet (40 mg total) by mouth 2 (two) times daily before a meal. 09/29/24 09/29/25 Yes Mansouraty, Aloha Raddle., MD  tetracycline  (SUMYCIN ) 500 MG capsule Take 1 capsule (500 mg  total) by mouth 4 (four) times daily for 14 days. 09/30/24 10/14/24 Yes Mansouraty, Aloha Raddle., MD  Bismuth  Subsalicylate 262 MG TABS Take 2 tablets (524 mg total) by mouth 4 (four) times daily for 14 days. Patient not taking: Reported on 10/01/2024 09/30/24 10/14/24  Mansouraty, Aloha Raddle., MD  ondansetron  (ZOFRAN ) 4 MG tablet Take 1 tablet (4 mg total) by mouth every 8 (eight) hours as needed for nausea or vomiting. Patient not taking: Reported on 10/01/2024 08/11/24  Waymond Lorelle Cummins, MD   Social History   Socioeconomic History   Marital status: Married    Spouse name: Deward   Number of children: 3   Years of education: Not on file   Highest education level: Some college, no degree  Occupational History   Occupation: Orthoptist: [employer]  Tobacco Use   Smoking status: Former    Current packs/day: 0.00    Average packs/day: 0.5 packs/day for 40.0 years (20.0 ttl pk-yrs)    Types: Cigarettes    Start date: 02/11/1973    Quit date: 02/11/2013    Years since quitting: 11.6   Smokeless tobacco: Never  Vaping Use   Vaping status: Never Used  Substance and Sexual Activity   Alcohol use: No   Drug use: No   Sexual activity: Yes    Comment: number of sex partners in the last 12 months  1  Other Topics Concern   Not on file  Social History Narrative   Daily caffeine. Married. Education: [employer]. Exercise: No.      Patient is right-handed. She lives with her husband in a two level home. She drinks 2 cups of coffee a day. 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   Social Drivers of Health   Tobacco Use: Medium Risk (10/01/2024)   Patient History    Smoking Tobacco Use: Former    Smokeless Tobacco Use: Never    Passive Exposure: Not on file  Financial Resource Strain: Low Risk (08/14/2023)   Overall Financial Resource Strain (CARDIA)    Difficulty of Paying Living Expenses: Not hard at all  Food Insecurity: No Food Insecurity (08/14/2023)    Hunger Vital Sign    Worried About Running Out of Food in the Last Year: Never true    Ran Out of Food in the Last Year: Never true  Transportation Needs: No Transportation Needs (08/14/2023)   PRAPARE - Administrator, Civil Service (Medical): No    Lack of Transportation (Non-Medical): No  Physical Activity: Sufficiently Active (08/14/2023)   Exercise Vital Sign    Days of Exercise per Week: 5 days    Minutes of Exercise per Session: 60 min  Stress: No Stress Concern Present (08/14/2023)   [employer] of Occupational Health - Occupational Stress Questionnaire    Feeling of Stress : Not at all  Social Connections: Moderately Integrated (08/14/2023)   Social Connection and Isolation Panel    Frequency of Communication with Friends and Family: Not on file    Frequency of Social Gatherings with Friends and Family: More than three times a week    Attends Religious Services: Never    Database Administrator or Organizations: Yes    Attends Engineer, Structural: More than 4 times per year    Marital Status: Married  Catering Manager Violence: Not At Risk (08/14/2023)   Humiliation, Afraid, Rape, and Kick questionnaire    Fear of Current or Ex-Partner: No    Emotionally Abused: No    Physically Abused: No    Sexually Abused: No  Depression (PHQ2-9): High Risk (10/01/2024)   Depression (PHQ2-9)    PHQ-2 Score: 18  Alcohol Screen: Low Risk (08/14/2023)   Alcohol Screen    Last Alcohol Screening Score (AUDIT): 0  Housing: Low Risk (08/14/2023)   Housing    Last Housing Risk Score: 0  Utilities: Not At Risk (08/14/2023)   AHC Utilities    Threatened  with loss of utilities: No  Health Literacy: Adequate Health Literacy (08/14/2023)   B1300 Health Literacy    Frequency of need for help with medical instructions: Never    Review of Systems Per HPI.   Objective:   Vitals:   10/01/24 1429  BP: 100/60  Pulse: 67  Resp: 12  Temp: 98.6 F (37 C)   TempSrc: Temporal  SpO2: 96%  Weight: 121 lb 12.8 oz (55.2 kg)  Height: 5' 4 (1.626 m)     Physical Exam Vitals reviewed.  Constitutional:      Appearance: Normal appearance. She is well-developed.  HENT:     Head: Normocephalic and atraumatic.  Eyes:     Conjunctiva/sclera: Conjunctivae normal.     Pupils: Pupils are equal, round, and reactive to light.  Neck:     Vascular: No carotid bruit.  Cardiovascular:     Rate and Rhythm: Normal rate and regular rhythm.     Heart sounds: Normal heart sounds.  Pulmonary:     Effort: Pulmonary effort is normal.     Breath sounds: Normal breath sounds.  Abdominal:     Palpations: Abdomen is soft. There is no pulsatile mass.     Tenderness: There is no abdominal tenderness.  Musculoskeletal:     Right lower leg: No edema.     Left lower leg: No edema.  Skin:    General: Skin is warm and dry.  Neurological:     Mental Status: She is alert and oriented to person, place, and time.  Psychiatric:        Mood and Affect: Mood normal.        Behavior: Behavior normal.    I personally spent a total of 63 minutes in the care of the patient today including preparing to see the patient, getting/reviewing separately obtained history, performing a medically appropriate exam/evaluation, counseling and educating, placing orders, referring and communicating with other health care professionals, and documenting clinical information in the EHR.     Assessment & Plan:  JAYNIE HITCH is a 68 y.o. female . Gastritis, Helicobacter pylori Nausea  - Recent diagnosis as above with treatment pending, will pick up prescription tonight.  Monitor for improvement of symptoms with treatment of H. pylori, gastritis.  Depression with anxiety  - Reports overall stable control with current med regimen.  She is continuing to follow-up with therapist, declines any changes in plan at this time.  RTC precautions given.  6-week follow-up with 4-week labs  prior.  Need for influenza vaccination - Plan: Flu vaccine HIGH DOSE PF(Fluzone Trivalent)  Other constipation  - Unfortunately continues to be an issue.  At this point with a stable TSH, less likely hypothyroidism contributor.  She is now on MiraLAX multiple times a day with minimal improvement.  With recent procedure I will reach out to her gastroenterologist to see if other meds are recommended, specifically something like Linzess but will continue on MiraLAX for now.  Cyanotic fingertip - Plan: Ambulatory referral to Vascular Surgery Subclavian steal syndrome - Plan: Ambulatory referral to Vascular Surgery  - Although symptoms have been going on for some time those are becoming more frequent and she describes a cyanotic appearing finger.  Differential would include Raynaud's but on to have that and only 1 finger.  Given her history of  subclavian steal and prior stenting on left will refer her to vascular surgery to evaluate these intermittent symptoms and decide if other testing indicated.  Asymptomatic in office at this  time.  RTC/ER precautions given.  Hypothyroidism, unspecified type  - Stable on most recent TSH, repeat labs in approximately 1 month and adjust plan accordingly.  6-week follow-up with me.  No orders of the defined types were placed in this encounter.  Patient Instructions  I will refer you to vascular, but if finger discoloration does not improve with position change/warming, be seen.  Repeat thyroid  and sodium levels in next 6 weeks.      Signed,   Reyes Pines, MD [facility], [facility] 10/01/2024 3:39 PM       [1] No Known Allergies

## 2024-10-01 NOTE — Patient Instructions (Addendum)
 I will refer you to vascular, but if finger discoloration does not improve with position change/warming, be seen.  Repeat thyroid  and sodium levels in next 1 month and we can follow-up within a week or 2 to discuss results.  No change in meds for now but could consider higher dose of fluoxetine .  Keep me posted.  Continue follow-up with therapist. I will reach out to your gastroenterologist regarding the constipation and recent procedure to see if we have additional options. Hang in there!

## 2024-10-21 ENCOUNTER — Ambulatory Visit: Payer: Self-pay | Admitting: Family Medicine

## 2024-10-21 ENCOUNTER — Other Ambulatory Visit (INDEPENDENT_AMBULATORY_CARE_PROVIDER_SITE_OTHER)

## 2024-10-21 DIAGNOSIS — E871 Hypo-osmolality and hyponatremia: Secondary | ICD-10-CM | POA: Diagnosis not present

## 2024-10-21 DIAGNOSIS — E039 Hypothyroidism, unspecified: Secondary | ICD-10-CM | POA: Diagnosis not present

## 2024-10-21 LAB — BASIC METABOLIC PANEL WITH GFR
BUN: 19 mg/dL (ref 6–23)
CO2: 29 meq/L (ref 19–32)
Calcium: 9.4 mg/dL (ref 8.4–10.5)
Chloride: 98 meq/L (ref 96–112)
Creatinine, Ser: 0.73 mg/dL (ref 0.40–1.20)
GFR: 84.6 mL/min
Glucose, Bld: 79 mg/dL (ref 70–99)
Potassium: 4 meq/L (ref 3.5–5.1)
Sodium: 133 meq/L — ABNORMAL LOW (ref 135–145)

## 2024-10-21 LAB — TSH: TSH: 2.73 u[IU]/mL (ref 0.35–5.50)

## 2024-10-23 ENCOUNTER — Ambulatory Visit (HOSPITAL_COMMUNITY)
Admission: RE | Admit: 2024-10-23 | Discharge: 2024-10-23 | Disposition: A | Discharge status: Home / Self Care | Source: Ambulatory Visit | Attending: Cardiovascular Disease | Admitting: Cardiovascular Disease

## 2024-10-23 DIAGNOSIS — I6523 Occlusion and stenosis of bilateral carotid arteries: Secondary | ICD-10-CM | POA: Diagnosis present

## 2024-10-23 DIAGNOSIS — I771 Stricture of artery: Secondary | ICD-10-CM | POA: Insufficient documentation

## 2024-10-23 MED ORDER — IOHEXOL 350 MG/ML SOLN
75.0000 mL | Freq: Once | INTRAVENOUS | Status: AC | PRN
  Administered 2024-10-23: 75 mL via INTRAVENOUS

## 2024-10-28 ENCOUNTER — Encounter: Payer: Self-pay | Admitting: Cardiovascular Disease

## 2024-10-29 ENCOUNTER — Ambulatory Visit: Payer: Self-pay | Admitting: Cardiovascular Disease

## 2024-11-05 NOTE — Progress Notes (Unsigned)
 "                         MRN : [id number]  Bethany Clarke is a 69 y.o. (Mar 05, 1956) female who presents with chief complaint of check carotid arteries.  History of Present Illness:   The patient is seen for evaluation of numbness associated with painful pins and needlelike sensations of her upper extremities both right and left although the right seems to be more affected than the left.  Is also associated with some mild discoloration again more prominent on the right compared to the left.  She has a remote history of left subclavian stent placement in 2014.  She denies claudication-like symptoms of the left lower extremity.  She does note she is aware of different blood pressures in the upper extremities.  No history of ulcerations or embolic events.  The patient denies vertigo or dizziness.  No changes with looking upward.  The patient denies rest pain or dangling of the arm for relief. No open wounds or sores of the arms have developed at this point in time. No prior vascular interventions or surgeries.  There is no documented history of neck problems or DJD of the cervical spine.  But she does have a history of a fall in the past with blunt force trauma to the head.  The patient denies claudication symptoms or rest pain symptoms of the lower extremities.  No new ulcers or wounds of the foot.  The patient's blood pressure has been stable and relatively well controlled. No documented history of amaurosis fugax or recent TIA symptoms. There are no recent neurological changes noted. No documented history of DVT, PE or superficial thrombophlebitis. No recent episodes of angina or shortness of breath.   CT angiogram of the neck dated October 23, 2024 is reviewed by me personally.  It does show mild atherosclerotic changes of the cervical carotid arteries primarily at the carotid bifurcation.  The left subclavian stent does appear to be patent with some element of restenosis.  Bilateral  vertebral arteries are patent.  The right and innominate and subclavian arteries are widely patent   Active Medications[1]  Past Medical History:  Diagnosis Date   Atherosclerosis 11/14/2005   carotid US  - mild calcific non-stenotic plaque bilaterally   Carotid bruit 02/05/2009   Doppler - R/L ICAs 0-49% diameter reductin (velocities suggest low-mid range); L subclavian 0-49%  diameter reduction   Chronic bronchitis (HCC)    growing up; not anymore (03/14/2013)   Colon polyp    hyperplastic   Costochondritis    Dizziness    Emphysema of lung (HCC)    GERD (gastroesophageal reflux disease)    H. pylori infection    Heart burn    Hemorrhoids    Hypothyroid    Lipidemia    Pneumonia ~ 2009   Subclavian artery stenosis, left    subclavian steal physiology status post PTA and stenting   Syncope, near     Past Surgical History:  Procedure Laterality Date   ABDOMINAL HYSTERECTOMY  1999   BILATERAL UPPER EXTREMITY ANGIOGRAM N/A 03/14/2013   Procedure: BILATERAL UPPER EXTREMITY ANGIOGRAM;  Surgeon: Dorn JINNY Lesches, MD;  Location: [facility] CATH LAB;  Service: Cardiovascular;  Laterality: N/A;   BIOPSY OF SKIN SUBCUTANEOUS TISSUE AND/OR MUCOUS MEMBRANE  09/29/2024   Procedure: BIOPSY, SKIN, SUBCUTANEOUS TISSUE, OR MUCOUS MEMBRANE;  Surgeon: Mansouraty, Aloha Raddle., MD;  Location: [facility] ENDOSCOPY;  Service: Gastroenterology;;   BREAST BIOPSY Right  1990's   benign (03/14/2013)   CARDIAC CATHETERIZATION  03/03/2013   DILATION AND CURETTAGE OF UTERUS  1980's   ESOPHAGOGASTRODUODENOSCOPY N/A 09/29/2024   Procedure: EGD (ESOPHAGOGASTRODUODENOSCOPY);  Surgeon: Wilhelmenia Aloha Raddle., MD;  Location: [facility];  Service: Gastroenterology;  Laterality: N/A;   EUS N/A 09/29/2024   Procedure: ULTRASOUND, UPPER GI TRACT, ENDOSCOPIC;  Surgeon: Wilhelmenia Aloha Raddle., MD;  Location: [facility];  Service: Gastroenterology;  Laterality: N/A;   KNEE ARTHROSCOPY WITH MENISCAL REPAIR Left 07/21/2021    Procedure: Arthroscopic debridement, arthroscopic debridement of bucket-handle component of lateral meniscus tear, and arthroscopically-assisted repair of lateral meniscus root tear, left knee. ;  Surgeon: Edie Norleen PARAS, MD;  Location: [facility] ORS;  Service: Orthopedics;  Laterality: Left;   LEFT HEART CATHETERIZATION WITH CORONARY ANGIOGRAM Bilateral 03/03/2013   Procedure: LEFT HEART CATHETERIZATION WITH CORONARY ANGIOGRAM;  Surgeon: Debby DELENA Sor, MD;  Location: [facility] CATH LAB;  Service: Cardiovascular;  Laterality: Bilateral;   PERCUTANEOUS STENT INTERVENTION  03/14/2013   Procedure: PERCUTANEOUS STENT INTERVENTION;  Surgeon: Dorn PARAS Lesches, MD;  Location: [facility] CATH LAB;  Service: Cardiovascular;;   SUBCLAVIAN ARTERY STENT Left 03/14/2013    for subclavian steal/claudication (03/14/2013)   TUBAL LIGATION  1980's    Social History Social History[2]  Family History Family History  Problem Relation Age of Onset   Diabetes Mother    Heart disease Mother    Stroke Mother    Liver disease Father    Liver cancer Father    Cancer Father        cirrohosis   Hypertension Father    Heart disease Sister    Diabetes Sister    Hyperlipidemia Sister    Hypertension Sister    COPD Sister    Arthritis Sister        rheumatoid   Diabetes Sister    Arthritis Sister        rheumatoid   Cancer Brother        spine/lung   COPD Brother        smoked   Hypertension Brother    Rheum arthritis Brother    Hyperlipidemia Brother    Hypertension Brother    Colon cancer Neg Hx    Esophageal cancer Neg Hx    Inflammatory bowel disease Neg Hx    Pancreatic cancer Neg Hx    Rectal cancer Neg Hx    Stomach cancer Neg Hx     Allergies[3]   REVIEW OF SYSTEMS (Negative unless checked)  Constitutional: [] Weight loss  [] Fever  [] Chills Cardiac: [] Chest pain   [] Chest pressure   [] Palpitations   [] Shortness of breath when laying flat   [] Shortness of breath with exertion. Vascular:  [x] Pain in legs with  walking   [] Pain in legs at rest  [] History of DVT   [] Phlebitis   [] Swelling in legs   [] Varicose veins   [] Non-healing ulcers Pulmonary:   [] Uses home oxygen   [] Productive cough   [] Hemoptysis   [] Wheeze  [] COPD   [] Asthma Neurologic:  [] Dizziness   [] Seizures   [] History of stroke   [] History of TIA  [] Aphasia   [] Vissual changes   [] Weakness or numbness in arm   [] Weakness or numbness in leg Musculoskeletal:   [] Joint swelling   [] Joint pain   [] Low back pain Hematologic:  [] Easy bruising  [] Easy bleeding   [] Hypercoagulable state   [] Anemic Gastrointestinal:  [] Diarrhea   [] Vomiting  [] Gastroesophageal reflux/heartburn   [] Difficulty swallowing. Genitourinary:  [] Chronic kidney disease   []   Difficult urination  [] Frequent urination   [] Blood in urine Skin:  [] Rashes   [] Ulcers  Psychological:  [] History of anxiety   []  History of major depression.  Physical Examination  There were no vitals filed for this visit. There is no height or weight on file to calculate BMI. Gen: WD/WN, NAD Head: [place]/AT, No temporalis wasting.  Ear/Nose/Throat: Hearing grossly intact, nares w/o erythema or drainage Eyes: PER, EOMI, sclera nonicteric.  Neck: Supple, no masses.  No bruit or JVD.  Pulmonary:  Good air movement, no audible wheezing, no use of accessory muscles.  Cardiac: RRR, normal S1, S2, no Murmurs. Vascular:  carotid bruit noted there is a definite difference between the strength of the right sided pulses compared to the left sided pulses with the left being diminished Vessel Right Left  Radial Palpable Palpable  Carotid  Palpable  Palpable  Subclav  2+ Palpable 1+ Palpable  Radial 2+ palpable 1+ palpable  Gastrointestinal: soft, non-distended. No guarding/no peritoneal signs.  Musculoskeletal: M/S 5/5 throughout.  No visible deformity.  Neurologic: CN 2-12 intact. Pain and light touch intact in extremities.  Symmetrical.  Speech is fluent. Motor exam as listed above. Psychiatric: Judgment  intact, Mood & affect appropriate for pt's clinical situation. Dermatologic: No rashes or ulcers noted.  No changes consistent with cellulitis.   CBC Lab Results  Component Value Date   WBC 5.5 08/11/2024   HGB 13.3 08/11/2024   HCT 40.1 08/11/2024   MCV 99.5 08/11/2024   PLT 275 08/11/2024    BMET    Component Value Date/Time   NA 133 (L) 10/21/2024 1044   NA 141 12/23/2020 1127   K 4.0 10/21/2024 1044   CL 98 10/21/2024 1044   CO2 29 10/21/2024 1044   GLUCOSE 79 10/21/2024 1044   BUN 19 10/21/2024 1044   BUN 10 12/23/2020 1127   CREATININE 0.73 10/21/2024 1044   CREATININE 0.68 12/01/2015 1712   CALCIUM  9.4 10/21/2024 1044   GFRNONAA >60 08/11/2024 1043   GFRNONAA >89 12/01/2015 1712   GFRAA 109 10/23/2019 1235   GFRAA >89 12/01/2015 1712   CrCl cannot be calculated (Unknown ideal weight.).  COAG Lab Results  Component Value Date   INR 1.0 07/24/2024   INR 1.0 09/28/2022   INR 0.94 03/11/2013    Radiology CT ANGIO NECK W OR WO CONTRAST Result Date: 10/29/2024 CLINICAL DATA:  Carotid artery disease, subclavian stenosis EXAM: CT ANGIOGRAPHY NECK TECHNIQUE: Multidetector CT imaging of the neck was performed using the standard protocol during bolus administration of intravenous contrast. Multiplanar CT image reconstructions and MIPs were obtained to evaluate the vascular anatomy. Carotid stenosis measurements (when applicable) are obtained utilizing NASCET criteria, using the distal internal carotid diameter as the denominator. RADIATION DOSE REDUCTION: This exam was performed according to the departmental dose-optimization program which includes automated exposure control, adjustment of the mA and/or kV according to patient size and/or use of iterative reconstruction technique. CONTRAST:  75mL OMNIPAQUE  IOHEXOL  350 MG/ML SOLN COMPARISON:  Ultrasound 1 3024 CTA NECK: CTA NECK Aortic arch: There is a stent in the left subclavian artery. The stent is patent. No stenosis of  the left common carotid artery or innominate artery origins. Right carotid: There is some calcified plaque.  No stenosis Left carotid: There is some calcified plaque.  No stenosis Right vertebral: Normal Left vertebral: Normal Soft tissues: No significant abnormality Other comments: There is emphysema IMPRESSION: 1. Patent stent in the left subclavian artery 2. No carotid artery stenosis on  either side 3. Normal vertebral arteries 4. Emphysema Electronically Signed   By: Nancyann Burns M.D.   On: 10/29/2024 09:18     Assessment/Plan 1. Pain of upper extremity, unspecified laterality The patient does have a history of vascular intervention but this is the left subclavian and her symptoms seem a bit more pronounced for the right upper extremity.  I am highly suspicious this represents degenerative disease in the cervical spine and there is a suggestion of some DJD of the cervical spine on her CT scan.  She has never been evaluated for this.  I will ask her to see neurosurgery to further work this up.  - Ambulatory referral to Neurosurgery  2. Subclavian arterial stenosis (HCC) (Primary) She does have an element of in-stent restenosis however she maintains a palpable pulse on the left and does not appear to have any claudication-like symptoms associated with the left upper extremity.  She is not having any vertebrobasilar symptoms.  I do not believe emergent intervention is indicated.  I will obtain noninvasive studies of the upper extremities to compare and will discuss the results with the patient when she returns.  - VAS US  ABI WITH/WO TBI; Future  3. Bilateral carotid artery stenosis Recommend:  Given the patient's asymptomatic subcritical stenosis no further invasive testing or surgery at this time.  Previous duplex ultrasound shows 1-39% stenosis bilaterally.  This is corroborated with the recent CT angio.  Continue antiplatelet therapy as prescribed Continue management of CAD, HTN and  Hyperlipidemia Healthy heart diet,  encouraged exercise at least 4 times per week  Follow up with duplex ultrasound of the carotid arteries as this will help me to evaluate flow in the left vertebral artery and ensure that there is no retrograde flow on that side which would indicate greater in-stent restenosis then I appreciate from the CT angiogram.  - VAS US  CAROTID; Future  4. COPD GOLD II with reversibility  Continue pulmonary medications and aerosols as already ordered, these medications have been reviewed and there are no changes at this time.   5. Hyperlipidemia with target LDL less than 70 Continue statin as ordered and reviewed, no changes at this time    Cordella Shawl, MD  11/05/2024 12:15 PM      [1]  No outpatient medications have been marked as taking for the 11/06/24 encounter (Appointment) with Shawl, Cordella MATSU, MD.  [2]  Social History Tobacco Use   Smoking status: Former    Current packs/day: 0.00    Average packs/day: 0.5 packs/day for 40.0 years (20.0 ttl pk-yrs)    Types: Cigarettes    Start date: 02/11/1973    Quit date: 02/11/2013    Years since quitting: 11.7   Smokeless tobacco: Never  Vaping Use   Vaping status: Never Used  Substance Use Topics   Alcohol use: No   Drug use: No  [3] No Known Allergies  "

## 2024-11-06 ENCOUNTER — Ambulatory Visit (INDEPENDENT_AMBULATORY_CARE_PROVIDER_SITE_OTHER): Admitting: Vascular Surgery

## 2024-11-06 ENCOUNTER — Encounter (INDEPENDENT_AMBULATORY_CARE_PROVIDER_SITE_OTHER): Payer: Self-pay | Admitting: Vascular Surgery

## 2024-11-06 VITALS — BP 130/84 | HR 67 | Resp 14 | Ht 64.0 in | Wt 121.0 lb

## 2024-11-06 DIAGNOSIS — E785 Hyperlipidemia, unspecified: Secondary | ICD-10-CM | POA: Diagnosis not present

## 2024-11-06 DIAGNOSIS — I6523 Occlusion and stenosis of bilateral carotid arteries: Secondary | ICD-10-CM

## 2024-11-06 DIAGNOSIS — M79603 Pain in arm, unspecified: Secondary | ICD-10-CM

## 2024-11-06 DIAGNOSIS — G458 Other transient cerebral ischemic attacks and related syndromes: Secondary | ICD-10-CM

## 2024-11-06 DIAGNOSIS — J449 Chronic obstructive pulmonary disease, unspecified: Secondary | ICD-10-CM | POA: Diagnosis not present

## 2024-11-06 DIAGNOSIS — I771 Stricture of artery: Secondary | ICD-10-CM | POA: Diagnosis not present

## 2024-11-10 NOTE — Progress Notes (Signed)
 "                         MRN : [id number]  Bethany Clarke is a 69 y.o. (January 08, 1956) female who presents with chief complaint of check carotid arteries.  History of Present Illness:   The patient is seen for follow up evaluation of carotid stenosis. The carotid stenosis followed by ultrasound.   The patient denies amaurosis fugax. There is no recent history of TIA symptoms or focal motor deficits. There is no prior documented CVA.  The patient is taking enteric-coated aspirin  81 mg daily.  Carotid Duplex done today shows RICA 1-39% and LICA 40-59%.  Antegrade flow is noted in both vertebral arteries.  The patient is also followed for numbness associated with painful pins and needlelike sensations of her upper extremities both right and left although the right seems to be more affected than the left.  Is also associated with some mild discoloration again more prominent on the right compared to the left.  She has a remote history of left subclavian stent placement in 2014.  She denies claudication-like symptoms of the left lower extremity.  She does note she is aware of different blood pressures in the upper extremities.  No history of ulcerations or embolic events.   The patient denies vertigo or dizziness.  No changes with looking upward.   The patient denies rest pain or dangling of the arm for relief. No open wounds or sores of the arms have developed at this point in time. No prior vascular interventions or surgeries.   There is no documented history of neck problems or DJD of the cervical spine.  But she does have a history of a fall in the past with blunt force trauma to the head.   The patient denies claudication symptoms or rest pain symptoms of the lower extremities.  No new ulcers or wounds of the foot.   The patient's blood pressure has been stable and relatively well controlled. No documented history of amaurosis fugax or recent TIA symptoms. There are no recent  neurological changes noted. No documented history of DVT, PE or superficial thrombophlebitis. No recent episodes of angina or shortness of breath.    CT angiogram of the neck dated October 23, 2024 is reviewed by me personally.  It does show mild atherosclerotic changes of the cervical carotid arteries primarily at the carotid bifurcation.  The left subclavian stent does appear to be patent with some element of restenosis.  Bilateral vertebral arteries are patent.  The right and innominate and subclavian arteries are widely patent    Active Medications[1]  Past Medical History:  Diagnosis Date   Atherosclerosis 11/14/2005   carotid US  - mild calcific non-stenotic plaque bilaterally   Carotid bruit 02/05/2009   Doppler - R/L ICAs 0-49% diameter reductin (velocities suggest low-mid range); L subclavian 0-49%  diameter reduction   Chronic bronchitis (HCC)    growing up; not anymore (03/14/2013)   Colon polyp    hyperplastic   Costochondritis    Dizziness    Emphysema of lung (HCC)    GERD (gastroesophageal reflux disease)    H. pylori infection    Heart burn    Hemorrhoids    Hypothyroid    Lipidemia    Pneumonia ~ 2009   Subclavian artery stenosis, left    subclavian steal physiology status post PTA and stenting   Syncope, near     Past Surgical History:  Procedure Laterality  Date   ABDOMINAL HYSTERECTOMY  1999   BILATERAL UPPER EXTREMITY ANGIOGRAM N/A 03/14/2013   Procedure: BILATERAL UPPER EXTREMITY ANGIOGRAM;  Surgeon: Dorn JINNY Lesches, MD;  Location: [facility] CATH LAB;  Service: Cardiovascular;  Laterality: N/A;   BIOPSY OF SKIN SUBCUTANEOUS TISSUE AND/OR MUCOUS MEMBRANE  09/29/2024   Procedure: BIOPSY, SKIN, SUBCUTANEOUS TISSUE, OR MUCOUS MEMBRANE;  Surgeon: Wilhelmenia Aloha Raddle., MD;  Location: [facility] END[facility]COPY;  Service: Gastroenterology;;   BREAST BIOPSY Right 1990's   benign (03/14/2013)   CARDIAC CATHETERIZATION  03/03/2013   DILATION AND CURETTAGE OF UTERUS  1980's    ESOPHAGOGASTRODUODENOSCOPY N/A 09/29/2024   Procedure: EGD (ESOPHAGOGASTRODUODENOSCOPY);  Surgeon: Wilhelmenia Aloha Raddle., MD;  Location: [facility];  Service: Gastroenterology;  Laterality: N/A;   EUS N/A 09/29/2024   Procedure: ULTRASOUND, UPPER GI TRACT, ENDOSCOPIC;  Surgeon: Wilhelmenia Aloha Raddle., MD;  Location: [facility];  Service: Gastroenterology;  Laterality: N/A;   KNEE ARTHROSCOPY WITH MENISCAL REPAIR Left 07/21/2021   Procedure: Arthroscopic debridement, arthroscopic debridement of bucket-handle component of lateral meniscus tear, and arthroscopically-assisted repair of lateral meniscus root tear, left knee. ;  Surgeon: Edie Norleen JINNY, MD;  Location: [facility] ORS;  Service: Orthopedics;  Laterality: Left;   LEFT HEART CATHETERIZATION WITH CORONARY ANGIOGRAM Bilateral 03/03/2013   Procedure: LEFT HEART CATHETERIZATION WITH CORONARY ANGIOGRAM;  Surgeon: Debby DELENA Sor, MD;  Location: [facility] CATH LAB;  Service: Cardiovascular;  Laterality: Bilateral;   PERCUTANEOUS STENT INTERVENTION  03/14/2013   Procedure: PERCUTANEOUS STENT INTERVENTION;  Surgeon: Dorn JINNY Lesches, MD;  Location: [facility] C[facility] LAB;  Service: Cardiovascular;;   SUBCLAVIAN ARTERY STENT Left 03/14/2013    for subclavian steal/claudication (03/14/2013)   TUBAL LIGATION  1980's    Social History Social History[2]  Family History Family History  Problem Relation Age of Onset   Diabetes Mother    Heart disease Mother    Stroke Mother    Liver disease Father    Liver cancer Father    Cancer Father        cirrohosis   Hypertension Father    Heart disease Sister    Diabetes Sister    Hyperlipidemia Sister    Hypertension Sister    COPD Sister    Arthritis Sister        rheumatoid   Diabetes Sister    Arthritis Sister        rheumatoid   Cancer Brother        spine/lung   COPD Brother        smoked   Hypertension Brother    Rheum arthritis Brother    Hyperlipidemia Brother    Hypertension Brother    Colon cancer  Neg Hx    Esophageal cancer Neg Hx    Inflammatory bowel disease Neg Hx    Pancreatic cancer Neg Hx    Rectal cancer Neg Hx    Stomach cancer Neg Hx     Allergies[3]   REVIEW OF SYSTEMS (Negative unless checked)  Constitutional: [] Weight loss  [] Fever  [] Chills Cardiac: [] Chest pain   [] Chest pressure   [] Palpitations   [] Shortness of breath when laying flat   [] Shortness of breath with exertion. Vascular:  [x] Pain in legs with walking   [] Pain in legs at rest  [] History of DVT   [] Phlebitis   [] Swelling in legs   [] Varicose veins   [] Non-healing ulcers Pulmonary:   [] Uses home oxygen   [] Productive cough   [] Hemoptysis   [] Wheeze  [] COPD   [] Asthma Neurologic:  [] Dizziness   []   Seizures   [] History of stroke   [] History of TIA  [] Aphasia   [] Vissual changes   [] Weakness or numbness in arm   [] Weakness or numbness in leg Musculoskeletal:   [] Joint swelling   [] Joint pain   [] Low back pain Hematologic:  [] Easy bruising  [] Easy bleeding   [] Hypercoagulable state   [] Anemic Gastrointestinal:  [] Diarrhea   [] Vomiting  [] Gastroesophageal reflux/heartburn   [] Difficulty swallowing. Genitourinary:  [] Chronic kidney disease   [] Difficult urination  [] Frequent urination   [] Blood in urine Skin:  [] Rashes   [] Ulcers  Psychological:  [] History of anxiety   []  History of major depression.  Physical Examination  There were no vitals filed for this visit. There is no height or weight on file to calculate BMI. Gen: WD/WN, NAD Head: [place]/AT, No temporalis wasting.  Ear/Nose/Throat: Hearing grossly intact, nares w/o erythema or drainage Eyes: PER, EOMI, sclera nonicteric.  Neck: Supple, no masses.  No bruit or JVD.  Pulmonary:  Good air movement, no audible wheezing, no use of accessory muscles.  Cardiac: RRR, normal S1, S2, no Murmurs. Vascular:  carotid bruit noted Vessel Right Left  Radial Palpable Palpable  Carotid  Palpable  Palpable  Subclav  Palpable Palpable  Gastrointestinal: soft,  non-distended. No guarding/no peritoneal signs.  Musculoskeletal: M/S 5/5 throughout.  No visible deformity.  Neurologic: CN 2-12 intact. Pain and light touch intact in extremities.  Symmetrical.  Speech is fluent. Motor exam as listed above. Psychiatric: Judgment intact, Mood & affect appropriate for pt's clinical situation. Dermatologic: No rashes or ulcers noted.  No changes consistent with cellulitis.   CBC Lab Results  Component Value Date   WBC 5.5 08/11/2024   HGB 13.3 08/11/2024   HCT 40.1 08/11/2024   MCV 99.5 08/11/2024   PLT 275 08/11/2024    BMET    Component Value Date/Time   NA 133 (L) 10/21/2024 1044   NA 141 12/23/2020 1127   K 4.0 10/21/2024 1044   CL 98 10/21/2024 1044   CO2 29 10/21/2024 1044   GLUCOSE 79 10/21/2024 1044   BUN 19 10/21/2024 1044   BUN 10 12/23/2020 1127   CREATININE 0.73 10/21/2024 1044   CREATININE 0.68 12/01/2015 1712   CALCIUM  9.4 10/21/2024 1044   GFRNONAA >60 08/11/2024 1043   GFRNONAA >89 12/01/2015 1712   GFRAA 109 10/23/2019 1235   GFRAA >89 12/01/2015 1712   Estimated Creatinine Clearance: 58.1 mL/min (by C-G formula based on SCr of 0.73 mg/dL).  COAG Lab Results  Component Value Date   INR 1.0 07/24/2024   INR 1.0 09/28/2022   INR 0.94 03/11/2013    Radiology CT ANGIO NECK W OR WO CONTRAST Result Date: 10/29/2024 CLINICAL DATA:  Carotid artery disease, subclavian stenosis EXAM: CT ANGIOGRAPHY NECK TECHNIQUE: Multidetector CT imaging of the neck was performed using the standard protocol during bolus administration of intravenous contrast. Multiplanar CT image reconstructions and MIPs were obtained to evaluate the vascular anatomy. Carotid stenosis measurements (when applicable) are obtained utilizing NASCET criteria, using the distal internal carotid diameter as the denominator. RADIATION DOSE REDUCTION: This exam was performed according to the departmental dose-optimization program which includes automated exposure control,  adjustment of the mA and/or kV according to patient size and/or use of iterative reconstruction technique. CONTRAST:  75mL OMNIPAQUE  IOHEXOL  350 MG/ML SOLN COMPARISON:  Ultrasound 1 3024 CTA NECK: CTA NECK Aortic arch: There is a stent in the left subclavian artery. The stent is patent. No stenosis of the left common carotid artery or  innominate artery origins. Right carotid: There is some calcified plaque.  No stenosis Left carotid: There is some calcified plaque.  No stenosis Right vertebral: Normal Left vertebral: Normal Soft tissues: No significant abnormality Other comments: There is emphysema IMPRESSION: 1. Patent stent in the left subclavian artery 2. No carotid artery stenosis on either side 3. Normal vertebral arteries 4. Emphysema Electronically Signed   By: Nancyann Burns M.D.   On: 10/29/2024 09:18     Assessment/Plan 1. Subclavian artery stenosis, left (Primary) She does have an element of in-stent restenosis however she maintains a palpable pulse on the left and does not appear to have any claudication-like symptoms associated with the left upper extremity. She is not having any vertebrobasilar symptoms. I do not believe emergent intervention is indicated. I will obtain noninvasive studies of the upper extremities to compare and will discuss the results with the patient when she returns.  However, given the antegrade flow in the left vertebral artery this would support that the in-stent restenosis is still on the mild not hemodynamically significant side.  We will continue to follow on an annual basis with duplex ultrasound - VAS US  CAROTID; Future  2. Subclavian steal syndrome She does have an element of in-stent restenosis however she maintains a palpable pulse on the left and does not appear to have any claudication-like symptoms associated with the left upper extremity. She is not having any vertebrobasilar symptoms. I do not believe emergent intervention is indicated. I will obtain  noninvasive studies of the upper extremities to compare and will discuss the results with the patient when she returns.  However, given the antegrade flow in the left vertebral artery this would support that the in-stent restenosis is still on the mild not hemodynamically significant side.  We will continue to follow on an annual basis with duplex ultrasound - VAS US  CAROTID; Future  3. Bilateral carotid artery stenosis Recommend:   Given the patient's asymptomatic subcritical stenosis no further invasive testing or surgery at this time.   Previous duplex ultrasound shows RICA 1-39% and LICA 40-59% stenosis bilaterally.  This is corroborated with the recent CT angio.   Continue antiplatelet therapy as prescribed Continue management of CAD, HTN and Hyperlipidemia Healthy heart diet,  encouraged exercise at least 4 times per week   Follow up with duplex ultrasound of the carotid arteries as this will help me to evaluate flow in the left vertebral artery and ensure that there is no retrograde flow on that side which would indicate greater in-stent restenosis then I appreciate from the CT angiogram. - VAS US  CAROTID; Future  4. COPD GOLD II with reversibility  Continue pulmonary medications and aerosols as already ordered, these medications have been reviewed and there are no changes at this time.   5. Hyperlipidemia with target LDL less than 70 Continue statin as ordered and reviewed, no changes at this time    Cordella Shawl, MD  11/10/2024 3:26 PM      [1]  No outpatient medications have been marked as taking for the 11/13/24 encounter (Appointment) with Shawl, Cordella MATSU, MD.  [2]  Social History Tobacco Use   Smoking status: Former    Current packs/day: 0.00    Average packs/day: 0.5 packs/day for 40.0 years (20.0 ttl pk-yrs)    Types: Cigarettes    Start date: 02/11/1973    Quit date: 02/11/2013    Years since quitting: 11.7   Smokeless tobacco: Never  Vaping Use    Vaping status: Never  Used  Substance Use Topics   Alcohol use: No   Drug use: No  [3] No Known Allergies  "

## 2024-11-13 ENCOUNTER — Ambulatory Visit (INDEPENDENT_AMBULATORY_CARE_PROVIDER_SITE_OTHER): Admitting: Vascular Surgery

## 2024-11-13 ENCOUNTER — Ambulatory Visit (INDEPENDENT_AMBULATORY_CARE_PROVIDER_SITE_OTHER)

## 2024-11-13 ENCOUNTER — Encounter (INDEPENDENT_AMBULATORY_CARE_PROVIDER_SITE_OTHER)

## 2024-11-13 ENCOUNTER — Encounter (INDEPENDENT_AMBULATORY_CARE_PROVIDER_SITE_OTHER): Payer: Self-pay | Admitting: Vascular Surgery

## 2024-11-13 VITALS — BP 110/68 | HR 67 | Resp 14

## 2024-11-13 DIAGNOSIS — E785 Hyperlipidemia, unspecified: Secondary | ICD-10-CM | POA: Diagnosis not present

## 2024-11-13 DIAGNOSIS — I6523 Occlusion and stenosis of bilateral carotid arteries: Secondary | ICD-10-CM

## 2024-11-13 DIAGNOSIS — I771 Stricture of artery: Secondary | ICD-10-CM | POA: Diagnosis not present

## 2024-11-13 DIAGNOSIS — J449 Chronic obstructive pulmonary disease, unspecified: Secondary | ICD-10-CM

## 2024-11-13 DIAGNOSIS — G458 Other transient cerebral ischemic attacks and related syndromes: Secondary | ICD-10-CM | POA: Diagnosis not present

## 2024-11-14 ENCOUNTER — Encounter (INDEPENDENT_AMBULATORY_CARE_PROVIDER_SITE_OTHER): Payer: Self-pay | Admitting: Vascular Surgery

## 2024-11-19 ENCOUNTER — Encounter: Payer: Self-pay | Admitting: Family Medicine

## 2024-11-19 ENCOUNTER — Ambulatory Visit: Admitting: Family Medicine

## 2024-11-19 ENCOUNTER — Ambulatory Visit: Admitting: *Deleted

## 2024-11-19 VITALS — BP 122/80 | HR 75 | Temp 98.5°F | Ht 63.0 in | Wt 119.4 lb

## 2024-11-19 VITALS — BP 122/80 | Ht 63.0 in | Wt 119.0 lb

## 2024-11-19 DIAGNOSIS — Z0000 Encounter for general adult medical examination without abnormal findings: Secondary | ICD-10-CM

## 2024-11-19 DIAGNOSIS — E871 Hypo-osmolality and hyponatremia: Secondary | ICD-10-CM

## 2024-11-19 DIAGNOSIS — E039 Hypothyroidism, unspecified: Secondary | ICD-10-CM

## 2024-11-19 DIAGNOSIS — B9681 Helicobacter pylori [H. pylori] as the cause of diseases classified elsewhere: Secondary | ICD-10-CM

## 2024-11-19 DIAGNOSIS — F418 Other specified anxiety disorders: Secondary | ICD-10-CM

## 2024-11-19 DIAGNOSIS — G458 Other transient cerebral ischemic attacks and related syndromes: Secondary | ICD-10-CM

## 2024-11-19 NOTE — Progress Notes (Signed)
 "  Chief Complaint  Patient presents with   Medicare Wellness     Subjective:   Bethany Clarke is a 69 y.o. female who presents for a Medicare Annual Wellness Visit.  Visit info / Clinical Intake: Medicare Wellness Visit Type:: Subsequent Annual Wellness Visit Persons participating in visit and providing information:: patient Medicare Wellness Visit Mode:: In-person (required for WTM) Interpreter Needed?: No Pre-visit prep was completed: no AWV questionnaire completed by patient prior to visit?: no Living arrangements:: lives with spouse/significant other Patient's Overall Health Status Rating: good Typical amount of pain: none Does pain affect daily life?: no Are you currently prescribed opioids?: no  Dietary Habits and Nutritional Risks How many meals a day?: 3 Eats fruit and vegetables daily?: yes Most meals are obtained by: preparing own meals In the last 2 weeks, have you had any of the following?: none Diabetic:: no  Functional Status Activities of Daily Living (to include ambulation/medication): Independent Ambulation: Independent Medication Administration: Independent Home Management (perform basic housework or laundry): Independent Manage your own finances?: yes Primary transportation is: driving Concerns about vision?: no *vision screening is required for WTM* Concerns about hearing?: no  Fall Screening Falls in the past year?: 0 Number of falls in past year: 0 Was there an injury with Fall?: 0 Fall Risk Category Calculator: 0 Patient Fall Risk Level: Low Fall Risk  Fall Risk Patient at Risk for Falls Due to: No Fall Risks Fall risk Follow up: Falls evaluation completed; Education provided; Falls prevention discussed  Home and Transportation Safety: All rugs have non-skid backing?: (!) no All stairs or steps have railings?: yes Grab bars in the bathtub or shower?: yes Have non-skid surface in bathtub or shower?: (!) no Good home lighting?:  yes Regular seat belt use?: yes Hospital stays in the last year:: no  Cognitive Assessment Difficulty concentrating, remembering, or making decisions? : no Will 6CIT or Mini Cog be Completed: yes What year is it?: 0 points What month is it?: 0 points Give patient an address phrase to remember (5 components): Its very sunny outside today in February About what time is it?: 0 points Count backwards from 20 to 1: 0 points Say the months of the year in reverse: 0 points Repeat the address phrase from earlier: 0 points 6 CIT Score: 0 points  Advance Directives (For Healthcare) Does Patient Have a Medical Advance Directive?: No Would patient like information on creating a medical advance directive?: No - Patient declined  Reviewed/Updated  Reviewed/Updated: Reviewed All (Medical, Surgical, Family, Medications, Allergies, Care Teams, Patient Goals); Surgical History; Family History; Medications; Allergies; Care Teams; Patient Goals; Medical History    Allergies (verified) Patient has no known allergies.   Current Medications (verified) Outpatient Encounter Medications as of 11/19/2024  Medication Sig   aspirin  EC 81 MG tablet Take 81 mg by mouth daily. Swallow whole.   atorvastatin  (LIPITOR) 40 MG tablet TAKE 1 TABLET BY MOUTH EVERY DAY   clopidogrel  (PLAVIX ) 75 MG tablet TAKE 1 TABLET BY MOUTH EVERY DAY   FLUoxetine  (PROZAC ) 40 MG capsule TAKE 1 CAPSULE (40 MG TOTAL) BY MOUTH DAILY.   levothyroxine  (SYNTHROID ) 100 MCG tablet Take 1 tablet (100 mcg total) by mouth daily.   omeprazole  (PRILOSEC) 20 MG capsule Take 1 capsule (20 mg total) by mouth 2 (two) times daily before a meal. (Patient not taking: Reported on 11/19/2024)   ondansetron  (ZOFRAN ) 4 MG tablet Take 1 tablet (4 mg total) by mouth every 8 (eight) hours as needed for  nausea or vomiting. (Patient not taking: Reported on 11/19/2024)   pantoprazole  (PROTONIX ) 40 MG tablet Take 1 tablet (40 mg total) by mouth 2 (two) times daily  before a meal. (Patient not taking: Reported on 11/19/2024)   No facility-administered encounter medications on file as of 11/19/2024.    History: Past Medical History:  Diagnosis Date   Atherosclerosis 11/14/2005   carotid US  - mild calcific non-stenotic plaque bilaterally   Carotid bruit 02/05/2009   Doppler - R/L ICAs 0-49% diameter reductin (velocities suggest low-mid range); L subclavian 0-49%  diameter reduction   Chronic bronchitis (HCC)    growing up; not anymore (03/14/2013)   Colon polyp    hyperplastic   Costochondritis    Dizziness    Emphysema of lung (HCC)    GERD (gastroesophageal reflux disease)    H. pylori infection    Heart burn    Hemorrhoids    Hypothyroid    Lipidemia    Pneumonia ~ 2009   Subclavian artery stenosis, left    subclavian steal physiology status post PTA and stenting   Syncope, near    Past Surgical History:  Procedure Laterality Date   ABDOMINAL HYSTERECTOMY  1999   BILATERAL UPPER EXTREMITY ANGIOGRAM N/A 03/14/2013   Procedure: BILATERAL UPPER EXTREMITY ANGIOGRAM;  Surgeon: Dorn JINNY Lesches, MD;  Location: [facility] CATH LAB;  Service: Cardiovascular;  Laterality: N/A;   BIOPSY OF SKIN SUBCUTANEOUS TISSUE AND/OR MUCOUS MEMBRANE  09/29/2024   Procedure: BIOPSY, SKIN, SUBCUTANEOUS TISSUE, OR MUCOUS MEMBRANE;  Surgeon: Wilhelmenia Aloha Raddle., MD;  Location: [facility]COPY;  Service: Gastroenterology;;   BREAST BIOPSY Right 1990's   benign (03/14/2013)   CARDIAC CATHETERIZATION  03/03/2013   DILATION AND CURETTAGE OF UTERUS  1980's   ESOPHAGOGASTRODUODENOSCOPY N/A 09/29/2024   Procedure: EGD (ESOPHAGOGASTRODUODENOSCOPY);  Surgeon: Wilhelmenia Aloha Raddle., MD;  Location: [facility];  Service: Gastroenterology;  Laterality: N/A;   EUS N/A 09/29/2024   Procedure: ULTRASOUND, UPPER GI TRACT, ENDOSCOPIC;  Surgeon: Wilhelmenia Aloha Raddle., MD;  Location: [facility];  Service: Gastroenterology;  Laterality: N/A;   KNEE ARTHROSCOPY WITH MENISCAL REPAIR  Left 07/21/2021   Procedure: Arthroscopic debridement, arthroscopic debridement of bucket-handle component of lateral meniscus tear, and arthroscopically-assisted repair of lateral meniscus root tear, left knee. ;  Surgeon: Edie Norleen JINNY, MD;  Location: [facility] ORS;  Service: Orthopedics;  Laterality: Left;   LEFT HEART CATHETERIZATION WITH CORONARY ANGIOGRAM Bilateral 03/03/2013   Procedure: LEFT HEART CATHETERIZATION WITH CORONARY ANGIOGRAM;  Surgeon: Debby DELENA Sor, MD;  Location: [facility] CATH LAB;  Service: Cardiovascular;  Laterality: Bilateral;   PERCUTANEOUS STENT INTERVENTION  03/14/2013   Procedure: PERCUTANEOUS STENT INTERVENTION;  Surgeon: Dorn JINNY Lesches, MD;  Location: [facility] CATH LAB;  Service: Cardiovascular;;   SUBCLAVIAN ARTERY STENT Left 03/14/2013    for subclavian steal/claudication (03/14/2013)   TUBAL LIGATION  1980's   Family History  Problem Relation Age of Onset   Diabetes Mother    Heart disease Mother    Stroke Mother    Liver disease Father    Liver cancer Father    Cancer Father        cirrohosis   Hypertension Father    Heart disease Sister    Diabetes Sister    Hyperlipidemia Sister    Hypertension Sister    COPD Sister    Arthritis Sister        rheumatoid   Diabetes Sister    Arthritis Sister        rheumatoid   Cancer Brother  spine/lung   COPD Brother        smoked   Hypertension Brother    Rheum arthritis Brother    Hyperlipidemia Brother    Hypertension Brother    Colon cancer Neg Hx    Esophageal cancer Neg Hx    Inflammatory bowel disease Neg Hx    Pancreatic cancer Neg Hx    Rectal cancer Neg Hx    Stomach cancer Neg Hx    Social History   Occupational History   Occupation: Orthoptist: [employer]  Tobacco Use   Smoking status: Former    Current packs/day: 0.00    Average packs/day: 0.5 packs/day for 40.0 years (20.0 ttl pk-yrs)    Types: Cigarettes    Start date: 02/11/1973    Quit date: 02/11/2013    Years  since quitting: 11.7   Smokeless tobacco: Never  Vaping Use   Vaping status: Never Used  Substance and Sexual Activity   Alcohol use: No   Drug use: No   Sexual activity: Yes    Comment: number of sex partners in the last 12 months  1   Tobacco Counseling Counseling given: Not Answered  SDOH Screenings   Food Insecurity: No Food Insecurity (11/19/2024)  Housing: Unknown (11/19/2024)  Transportation Needs: No Transportation Needs (11/19/2024)  Utilities: Not At Risk (11/19/2024)  Alcohol Screen: Low Risk (08/14/2023)  Depression (PHQ2-9): Low Risk (11/19/2024)  Recent Concern: Depression (PHQ2-9) - High Risk (10/01/2024)  Financial Resource Strain: Low Risk (08/14/2023)  Physical Activity: Inactive (11/19/2024)  Social Connections: Moderately Integrated (11/19/2024)  Stress: No Stress Concern Present (11/19/2024)  Tobacco Use: Medium Risk (11/19/2024)  Health Literacy: Adequate Health Literacy (11/19/2024)   See flowsheets for full screening details  Depression Screen PHQ 2 & 9 Depression Scale- Over the past 2 weeks, how often have you been bothered by any of the following problems? Little interest or pleasure in doing things: 0 Feeling down, depressed, or hopeless (PHQ Adolescent also includes...irritable): 0 PHQ-2 Total Score: 0 Trouble falling or staying asleep, or sleeping too much: 0 Feeling tired or having little energy: 0 Poor appetite or overeating (PHQ Adolescent also includes...weight loss): 0 Feeling bad about yourself - or that you are a failure or have let yourself or your family down: 0 Trouble concentrating on things, such as reading the newspaper or watching television (PHQ Adolescent also includes...like school work): 0 Moving or speaking so slowly that other people could have noticed. Or the opposite - being so fidgety or restless that you have been moving around a lot more than usual: 0 Thoughts that you would be better off dead, or of hurting yourself in some way: 0 PHQ-9  Total Score: 0 If you checked off any problems, how difficult have these problems made it for you to do your work, take care of things at home, or get along with other people?: Not difficult at all  Depression Treatment Depression Interventions/Treatment : Currently on Treatment     Goals Addressed             This Visit's Progress    Patient Stated       Stay alive             Objective:    Today's Vitals   11/19/24 1538  BP: 122/80  Weight: 119 lb (54 kg)  Height: 5' 3 (1.6 m)   Body mass index is 21.08 kg/m.  Hearing/Vision screen Hearing Screening - Comments:: No trouble hearing  Vision Screening - Comments:: [place] [facility] Up to date Immunizations and Health Maintenance Health Maintenance  Topic Date Due   COVID-19 Vaccine (4 - 2025-26 season) 12/05/2024 (Originally 06/16/2024)   Zoster Vaccines- Shingrix  (1 of 2) 02/16/2025 (Originally 07/18/2006)   Lung Cancer Screening  11/19/2025 (Originally 12/23/2016)   Mammogram  11/19/2025 (Originally 01/21/2023)   Hepatitis C Screening  11/19/2025 (Originally 07[birth year]18/1974)   Fecal DNA (Cologuard)  11/19/2025 (Originally 07/18/2001)   Medicare Annual Wellness (AWV)  11/19/2025   DTaP/Tdap/Td (4 - Td or Tdap) 04/12/2027   Pneumococcal Vaccine: 50+ Years  Completed   Influenza Vaccine  Completed   Bone Density Scan  Completed   Meningococcal B Vaccine  Aged Out   Colonoscopy  Discontinued        Assessment/Plan:  This is a routine wellness examination for Bethany Clarke.  Patient Care Team: Levora Reyes SAUNDERS, MD as PCP - General (Family Medicine) Verlin Lonni BIRCH, MD as PCP - Cardiology (Cardiology) Skeet Juliene SAUNDERS, DO as Consulting Physician (Neurology)  I have personally reviewed and noted the following in the patients chart:   Medical and social history Use of alcohol, tobacco or illicit drugs  Current medications and supplements including opioid prescriptions. Functional ability and status Nutritional  status Physical activity Advanced directives List of other physicians Hospitalizations, surgeries, and ER visits in previous 12 months Vitals Screenings to include cognitive, depression, and falls Referrals and appointments  No orders of the defined types were placed in this encounter.  In addition, I have reviewed and discussed with patient certain preventive protocols, quality metrics, and best practice recommendations. A written personalized care plan for preventive services as well as general preventive health recommendations were provided to patient.   Ovidio Steele, LPN   04/19/7972   Return in 1 year (on 11/19/2025).  After Visit Summary: (MyChart) Due to this being a telephonic visit, the after visit summary with patients personalized plan was offered to patient via MyChart   Nurse Notes:  "

## 2024-11-19 NOTE — Patient Instructions (Signed)
 Glad to hear you are doing better.  I am not concerned with the most recent sodium level as it is stable and only mildly low.  Most recent TSH also looks good.  I have placed lab only orders for both to be repeated in 6 weeks, can schedule that lab visit today and then follow-up with me in 3 months.  Happy to see you sooner.  Continue follow-up with gastroenterology and as long as your intestinal symptoms are continuing to improve I think waiting until that next visit would be reasonable.  If you have any worsening of the abdominal pain or symptoms contact our office right away.  Let me know if I can help and hang in there.

## 2024-11-19 NOTE — Progress Notes (Unsigned)
 "  Subjective:  Patient ID: Bethany Clarke, female    DOB: 09-29-1956  Age: 69 y.o. MRN: [id number]  CC:  Chief Complaint  Patient presents with   Follow-up    6 week follow ups. Neuro appt about disc bone on bone. Patient would like to go over test results but cannot recall what test     HPI Bethany Clarke presents for  Follow-up from a 10/01/2024 visit.  Gastritis with H. pylori Recent diagnosis at her December visit, had been prescribed treatment from GI. Did help, less sore, less nausea. Still some soreness with certain foods. Follow up with GI on the 18th.   Depression with anxiety Stable control at her December visit.  Continue to follow-up with therapist. Still doing ok. Less depressed.   Constipation Continued issue at her last visit, in spite of a stable TSH, less likely hypothyroidism as main contributor as initially thought.  She was taking MiraLAX few times per day with minimal improvement.  Constipation improved. Did have diarrhea up to 5-6 times with tx for H pylori - now less watery, 3-4 times per day. No recent miralax.   History of subclavian steal syndrome and cyanosis of fingertip Referred to vascular surgery after last visit, differential of Raynaud's but only 1 1 finger and history of subclavian steal. I do see that she had a CT angiogram of her neck on January 8, notes per cardiology that the stent in the left subclavian artery was patent with no evidence of disease in either carotid artery. Appointment with vascular Dr. Dreama on January 29.  Noted element of in-stent restenosis however maintained a palpable pulse on left and did not appear to have any claudication-like symptoms of the left upper extremity.  No vertebrobasilar symptoms.  Thought to have mild in-stent restenosis continue to follow-up annually with duplex ultrasound Carotid Dopplers on 11/13/2024, right carotid ICA with velocities consistent with 1 to 39% stenosis, left carotid ICA with  velocities 40 to 59% stenosis.  Was told by vascular that some of the tingling/hand symptoms could be nerve impingement - Appt with neuro on 1/26.   Hypothyroidism: Lab Results  Component Value Date   TSH 2.73 10/21/2024  Stable on most recent testing, with TSH 1.7 in November, 2.7 most recently January 6.Treated with Synthroid  100 mcg daily.  Hyponatremia Possibly secondary to untreated hypothyroidism previously.  Improved and levels on repeat testing.  Stable sodium of 133 on most recent testing, similar to December and improved from October.  Lab Results  Component Value Date   NA 133 (L) 10/21/2024   K 4.0 10/21/2024   CL 98 10/21/2024   CO2 29 10/21/2024    History Patient Active Problem List   Diagnosis Date Noted   Dilated pancreatic duct 09/29/2024   Gastritis without bleeding 09/29/2024   Other constipation 08/01/2024   Chronic nausea 08/01/2024   RUQ abdominal pain 08/01/2024   Dilated bile duct 08/01/2024   Neck pain on right side 10/04/2022   Cervico-occipital neuralgia of right side 10/04/2022   Pain in joints of left hand 07/17/2019   Chest pain, atypical 03/17/2016   Paresthesia 07/14/2013   Bilateral foot pain 05/01/2013   Subclavian artery stenosis, left    Subclavian steal syndrome 03/16/2013   Subclavian arterial stenosis (HCC) 03/11/2013   Carotid stenosis 03/11/2013   Hyperlipidemia with target LDL less than 70 03/11/2013   GERD (gastroesophageal reflux disease)    Hypothyroid    Dizziness    Syncope, near  Heart burn    Hypothyroidism 05/28/2007   COPD GOLD II with reversibility  05/28/2007   Past Medical History:  Diagnosis Date   Atherosclerosis 11/14/2005   carotid US  - mild calcific non-stenotic plaque bilaterally   Carotid bruit 02/05/2009   Doppler - R/L ICAs 0-49% diameter reductin (velocities suggest low-mid range); L subclavian 0-49%  diameter reduction   Chronic bronchitis (HCC)    growing up; not anymore (03/14/2013)   Colon  polyp    hyperplastic   Costochondritis    Dizziness    Emphysema of lung (HCC)    GERD (gastroesophageal reflux disease)    H. pylori infection    Heart burn    Hemorrhoids    Hypothyroid    Lipidemia    Pneumonia ~ 2009   Subclavian artery stenosis, left    subclavian steal physiology status post PTA and stenting   Syncope, near    Past Surgical History:  Procedure Laterality Date   ABDOMINAL HYSTERECTOMY  1999   BILATERAL UPPER EXTREMITY ANGIOGRAM N/A 03/14/2013   Procedure: BILATERAL UPPER EXTREMITY ANGIOGRAM;  Surgeon: Dorn JINNY Lesches, MD;  Location: [facility] CATH LAB;  Service: Cardiovascular;  Laterality: N/A;   BIOPSY OF SKIN SUBCUTANEOUS TISSUE AND/OR MUCOUS MEMBRANE  09/29/2024   Procedure: BIOPSY, SKIN, SUBCUTANEOUS TISSUE, OR MUCOUS MEMBRANE;  Surgeon: Wilhelmenia Aloha Raddle., MD;  Location: [facility] ENDOSCOPY;  Service: Gastroenterology;;   BREAST BIOPSY Right 1990's   benign (03/14/2013)   CARDIAC CATHETERIZATION  03/03/2013   DILATION AND CURETTAGE OF UTERUS  1980's   ESOPHAGOGASTRODUODENOSCOPY N/A 09/29/2024   Procedure: EGD (ESOPHAGOGASTRODUODENOSCOPY);  Surgeon: Wilhelmenia Aloha Raddle., MD;  Location: [facility];  Service: Gastroenterology;  Laterality: N/A;   EUS N/A 09/29/2024   Procedure: ULTRASOUND, UPPER GI TRACT, ENDOSCOPIC;  Surgeon: Wilhelmenia Aloha Raddle., MD;  Location: [facility];  Service: Gastroenterology;  Laterality: N/A;   KNEE ARTHROSCOPY WITH MENISCAL REPAIR Left 07/21/2021   Procedure: Arthroscopic debridement, arthroscopic debridement of bucket-handle component of lateral meniscus tear, and arthroscopically-assisted repair of lateral meniscus root tear, left knee. ;  Surgeon: Edie Norleen JINNY, MD;  Location: [facility] ORS;  Service: Orthopedics;  Laterality: Left;   LEFT HEART CATHETERIZATION WITH CORONARY ANGIOGRAM Bilateral 03/03/2013   Procedure: LEFT HEART CATHETERIZATION WITH CORONARY ANGIOGRAM;  Surgeon: Debby DELENA Sor, MD;  Location: [facility] CATH LAB;  Service:  Cardiovascular;  Laterality: Bilateral;   PERCUTANEOUS STENT INTERVENTION  03/14/2013   Procedure: PERCUTANEOUS STENT INTERVENTION;  Surgeon: Dorn JINNY Lesches, MD;  Location: [facility] CATH LAB;  Service: Cardiovascular;;   SUBCLAVIAN ARTERY STENT Left 03/14/2013    for subclavian steal/claudication (03/14/2013)   TUBAL LIGATION  1980's   Allergies[1] Prior to Admission medications  Medication Sig Start Date End Date Taking? Authorizing Provider  aspirin  EC 81 MG tablet Take 81 mg by mouth daily. Swallow whole.   Yes [provider]  atorvastatin  (LIPITOR) 40 MG tablet TAKE 1 TABLET BY MOUTH EVERY DAY 03/03/24  Yes Levora Bethany SAUNDERS, MD  clopidogrel  (PLAVIX ) 75 MG tablet TAKE 1 TABLET BY MOUTH EVERY DAY 04/07/24  Yes Levora Bethany SAUNDERS, MD  FLUoxetine  (PROZAC ) 40 MG capsule TAKE 1 CAPSULE (40 MG TOTAL) BY MOUTH DAILY. 07/09/24  Yes Levora Bethany SAUNDERS, MD  levothyroxine  (SYNTHROID ) 100 MCG tablet Take 1 tablet (100 mcg total) by mouth daily. 04/10/24  Yes Levora Bethany SAUNDERS, MD  omeprazole  (PRILOSEC) 20 MG capsule Take 1 capsule (20 mg total) by mouth 2 (two) times daily before a meal. Patient not taking: Reported on 11/19/2024  09/30/24 10/14/24  Mansouraty, Aloha Raddle., MD  ondansetron  (ZOFRAN ) 4 MG tablet Take 1 tablet (4 mg total) by mouth every 8 (eight) hours as needed for nausea or vomiting. Patient not taking: Reported on 11/19/2024 08/11/24   Waymond Lorelle Cummins, MD  pantoprazole  (PROTONIX ) 40 MG tablet Take 1 tablet (40 mg total) by mouth 2 (two) times daily before a meal. Patient not taking: Reported on 11/19/2024 09/29/24 09/29/25  Mansouraty, Aloha Raddle., MD   Social History   Socioeconomic History   Marital status: Married    Spouse name: Deward   Number of children: 3   Years of education: Not on file   Highest education level: Some college, no degree  Occupational History   Occupation: Orthoptist: [employer]  Tobacco Use   Smoking status: Former    Current  packs/day: 0.00    Average packs/day: 0.5 packs/day for 40.0 years (20.0 ttl pk-yrs)    Types: Cigarettes    Start date: 02/11/1973    Quit date: 02/11/2013    Years since quitting: 11.7   Smokeless tobacco: Never  Vaping Use   Vaping status: Never Used  Substance and Sexual Activity   Alcohol use: No   Drug use: No   Sexual activity: Yes    Comment: number of sex partners in the last 12 months  1  Other Topics Concern   Not on file  Social History Narrative   Daily caffeine. Married. Education: [employer]. Exercise: No.      Patient is right-handed. She lives with her husband in a two level home. She drinks 2 cups of coffee a day. 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   Social Drivers of Health   Tobacco Use: Medium Risk (11/19/2024)   Patient History    Smoking Tobacco Use: Former    Smokeless Tobacco Use: Never    Passive Exposure: Not on file  Financial Resource Strain: Low Risk (08/14/2023)   Overall Financial Resource Strain (CARDIA)    Difficulty of Paying Living Expenses: Not hard at all  Food Insecurity: No Food Insecurity (08/14/2023)   Hunger Vital Sign    Worried About Running Out of Food in the Last Year: Never true    Ran Out of Food in the Last Year: Never true  Transportation Needs: No Transportation Needs (08/14/2023)   PRAPARE - Administrator, Civil Service (Medical): No    Lack of Transportation (Non-Medical): No  Physical Activity: Sufficiently Active (08/14/2023)   Exercise Vital Sign    Days of Exercise per Week: 5 days    Minutes of Exercise per Session: 60 min  Stress: No Stress Concern Present (08/14/2023)   [employer] of Occupational Health - Occupational Stress Questionnaire    Feeling of Stress : Not at all  Social Connections: Moderately Integrated (08/14/2023)   Social Connection and Isolation Panel    Frequency of Communication with Friends and Family: Not on file    Frequency of Social Gatherings with Friends  and Family: More than three times a week    Attends Religious Services: Never    Database Administrator or Organizations: Yes    Attends Engineer, Structural: More than 4 times per year    Marital Status: Married  Catering Manager Violence: Not At Risk (08/14/2023)   Humiliation, Afraid, Rape, and Kick questionnaire    Fear of Current or Ex-Partner: No    Emotionally Abused: No  Physically Abused: No    Sexually Abused: No  Depression (PHQ2-9): High Risk (10/01/2024)   Depression (PHQ2-9)    PHQ-2 Score: 18  Alcohol Screen: Low Risk (08/14/2023)   Alcohol Screen    Last Alcohol Screening Score (AUDIT): 0  Housing: Low Risk (08/14/2023)   Housing    Last Housing Risk Score: 0  Utilities: Not At Risk (08/14/2023)   AHC Utilities    Threatened with loss of utilities: No  Health Literacy: Adequate Health Literacy (08/14/2023)   B1300 Health Literacy    Frequency of need for help with medical instructions: Never    Review of Systems   Objective:   Vitals:   11/19/24 1427  BP: 122/80  Pulse: 75  Temp: 98.5 F (36.9 C)  TempSrc: Temporal  SpO2: 97%  Weight: 119 lb 6.4 oz (54.2 kg)  Height: 5' 3 (1.6 m)     Physical Exam Vitals reviewed.  Constitutional:      Appearance: Normal appearance. She is well-developed.  HENT:     Head: Normocephalic and atraumatic.  Eyes:     Conjunctiva/sclera: Conjunctivae normal.     Pupils: Pupils are equal, round, and reactive to light.  Neck:     Vascular: No carotid bruit.  Cardiovascular:     Rate and Rhythm: Normal rate and regular rhythm.     Heart sounds: Normal heart sounds.  Pulmonary:     Effort: Pulmonary effort is normal.     Breath sounds: Normal breath sounds.  Abdominal:     General: There is no distension.     Palpations: Abdomen is soft. There is no pulsatile mass.     Tenderness: There is abdominal tenderness (Minimal suprapubic.).  Musculoskeletal:     Right lower leg: No edema.     Left lower  leg: No edema.  Skin:    General: Skin is warm and dry.  Neurological:     Mental Status: She is alert and oriented to person, place, and time.  Psychiatric:        Mood and Affect: Mood normal.        Behavior: Behavior normal.     Assessment & Plan:  Bethany Clarke is a 69 y.o. female . Gastritis, Helicobacter pylori  Depression with anxiety  Hypothyroidism, unspecified type  Hyponatremia  Subclavian steal syndrome   No orders of the defined types were placed in this encounter.  Patient Instructions  Glad to hear you are doing better.  I am not concerned with the most recent sodium level as it is stable and only mildly low.  Most recent TSH also looks good.  I have placed lab only orders for both to be repeated in 6 weeks, can schedule that lab visit today and then follow-up with me in 3 months.  Happy to see you sooner.  Continue follow-up with gastroenterology and as long as your intestinal symptoms are continuing to improve I think waiting until that next visit would be reasonable.  If you have any worsening of the abdominal pain or symptoms contact our office right away.  Let me know if I can help and hang in there.    Signed,   Bethany Pines, MD  Primary Care, [facility] 11/19/24 3:28 PM      [1] No Known Allergies  "

## 2024-11-19 NOTE — Patient Instructions (Signed)
 [name] , Thank you for taking time to come for your Medicare Wellness Visit. I appreciate your ongoing commitment to your health goals. Please review the following plan we discussed and let me know if I can assist you in the future.   Screening recommendations/referrals: Colonoscopy:  Mammogram:  Bone Density:  Recommended yearly ophthalmology/optometry visit for glaucoma screening and checkup Recommended yearly dental visit for hygiene and checkup  Vaccinations: Influenza vaccine:  Pneumococcal vaccine:  Tdap vaccine:  Shingles vaccine:         Preventive Care 65 Years and Older, Female Preventive care refers to lifestyle choices and visits with your health care provider that can promote health and wellness. What does preventive care include? A yearly physical exam. This is also called an annual well check. Dental exams once or twice a year. Routine eye exams. Ask your health care provider how often you should have your eyes checked. Personal lifestyle choices, including: Daily care of your teeth and gums. Regular physical activity. Eating a healthy diet. Avoiding tobacco and drug use. Limiting alcohol use. Practicing safe sex. Taking low-dose aspirin  every day. Taking vitamin and mineral supplements as recommended by your health care provider. What happens during an annual well check? The services and screenings done by your health care provider during your annual well check will depend on your age, overall health, lifestyle risk factors, and family history of disease. Counseling  Your health care provider may ask you questions about your: Alcohol use. Tobacco use. Drug use. Emotional well-being. Home and relationship well-being. Sexual activity. Eating habits. History of falls. Memory and ability to understand (cognition). Work and work astronomer. Reproductive health. Screening  You may have the following tests or measurements: Height, weight, and  BMI. Blood pressure. Lipid and cholesterol levels. These may be checked every 5 years, or more frequently if you are over 26 years old. Skin check. Lung cancer screening. You may have this screening every year starting at age 53 if you have a 30-pack-year history of smoking and currently smoke or have quit within the past 15 years. Fecal occult blood test (FOBT) of the stool. You may have this test every year starting at age 61. Flexible sigmoidoscopy or colonoscopy. You may have a sigmoidoscopy every 5 years or a colonoscopy every 10 years starting at age 43. Hepatitis C blood test. Hepatitis B blood test. Sexually transmitted disease (STD) testing. Diabetes screening. This is done by checking your blood sugar (glucose) after you have not eaten for a while (fasting). You may have this done every 1-3 years. Bone density scan. This is done to screen for osteoporosis. You may have this done starting at age 57. Mammogram. This may be done every 1-2 years. Talk to your health care provider about how often you should have regular mammograms. Talk with your health care provider about your test results, treatment options, and if necessary, the need for more tests. Vaccines  Your health care provider may recommend certain vaccines, such as: Influenza vaccine. This is recommended every year. Tetanus, diphtheria, and acellular pertussis (Tdap, Td) vaccine. You may need a Td booster every 10 years. Zoster vaccine. You may need this after age 6. Pneumococcal 13-valent conjugate (PCV13) vaccine. One dose is recommended after age 78. Pneumococcal polysaccharide (PPSV23) vaccine. One dose is recommended after age 40. Talk to your health care provider about which screenings and vaccines you need and how often you need them. This information is not intended to replace advice given to you by your  health care provider. Make sure you discuss any questions you have with your health care provider. Document  Released: 10/29/2015 Document Revised: 06/21/2016 Document Reviewed: 08/03/2015 Elsevier Interactive Patient Education  2017 [employer].  Fall Prevention in the Home Falls can cause injuries. They can happen to people of all ages. There are many things you can do to make your home safe and to help prevent falls. What can I do on the outside of my home? Regularly fix the edges of walkways and driveways and fix any cracks. Remove anything that might make you trip as you walk through a door, such as a raised step or threshold. Trim any bushes or trees on the path to your home. Use bright outdoor lighting. Clear any walking paths of anything that might make someone trip, such as rocks or tools. Regularly check to see if handrails are loose or broken. Make sure that both sides of any steps have handrails. Any raised decks and porches should have guardrails on the edges. Have any leaves, snow, or ice cleared regularly. Use sand or salt on walking paths during winter. Clean up any spills in your garage right away. This includes oil or grease spills. What can I do in the bathroom? Use night lights. Install grab bars by the toilet and in the tub and shower. Do not use towel bars as grab bars. Use non-skid mats or decals in the tub or shower. If you need to sit down in the shower, use a plastic, non-slip stool. Keep the floor dry. Clean up any water that spills on the floor as soon as it happens. Remove soap buildup in the tub or shower regularly. Attach bath mats securely with double-sided non-slip rug tape. Do not have throw rugs and other things on the floor that can make you trip. What can I do in the bedroom? Use night lights. Make sure that you have a light by your bed that is easy to reach. Do not use any sheets or blankets that are too big for your bed. They should not hang down onto the floor. Have a firm chair that has side arms. You can use this for support while you get dressed. Do  not have throw rugs and other things on the floor that can make you trip. What can I do in the kitchen? Clean up any spills right away. Avoid walking on wet floors. Keep items that you use a lot in easy-to-reach places. If you need to reach something above you, use a strong step stool that has a grab bar. Keep electrical cords out of the way. Do not use floor polish or wax that makes floors slippery. If you must use wax, use non-skid floor wax. Do not have throw rugs and other things on the floor that can make you trip. What can I do with my stairs? Do not leave any items on the stairs. Make sure that there are handrails on both sides of the stairs and use them. Fix handrails that are broken or loose. Make sure that handrails are as long as the stairways. Check any carpeting to make sure that it is firmly attached to the stairs. Fix any carpet that is loose or worn. Avoid having throw rugs at the top or bottom of the stairs. If you do have throw rugs, attach them to the floor with carpet tape. Make sure that you have a light switch at the top of the stairs and the bottom of the stairs. If you do  not have them, ask someone to add them for you. What else can I do to help prevent falls? Wear shoes that: Do not have high heels. Have rubber bottoms. Are comfortable and fit you well. Are closed at the toe. Do not wear sandals. If you use a stepladder: Make sure that it is fully opened. Do not climb a closed stepladder. Make sure that both sides of the stepladder are locked into place. Ask someone to hold it for you, if possible. Clearly mark and make sure that you can see: Any grab bars or handrails. First and last steps. Where the edge of each step is. Use tools that help you move around (mobility aids) if they are needed. These include: Canes. Walkers. Scooters. Crutches. Turn on the lights when you go into a dark area. Replace any light bulbs as soon as they burn out. Set up your  furniture so you have a clear path. Avoid moving your furniture around. If any of your floors are uneven, fix them. If there are any pets around you, be aware of where they are. Review your medicines with your doctor. Some medicines can make you feel dizzy. This can increase your chance of falling. Ask your doctor what other things that you can do to help prevent falls. This information is not intended to replace advice given to you by your health care provider. Make sure you discuss any questions you have with your health care provider. Document Released: 07/29/2009 Document Revised: 03/09/2016 Document Reviewed: 11/06/2014 Elsevier Interactive Patient Education  2017 [employer].

## 2024-11-25 ENCOUNTER — Encounter

## 2024-12-01 ENCOUNTER — Ambulatory Visit: Admitting: Gastroenterology

## 2024-12-03 ENCOUNTER — Ambulatory Visit: Admitting: Gastroenterology

## 2024-12-11 ENCOUNTER — Ambulatory Visit: Admitting: Orthopedic Surgery

## 2025-02-19 ENCOUNTER — Ambulatory Visit: Admitting: Family Medicine

## 2025-11-12 ENCOUNTER — Ambulatory Visit (INDEPENDENT_AMBULATORY_CARE_PROVIDER_SITE_OTHER): Admitting: Vascular Surgery

## 2025-11-12 ENCOUNTER — Encounter (INDEPENDENT_AMBULATORY_CARE_PROVIDER_SITE_OTHER)
